# Patient Record
Sex: Male | Born: 1955 | Race: Black or African American | Hispanic: No | Marital: Single | Smoking: Current some day smoker
Health system: Southern US, Community
[De-identification: ages and names within clinical notes are randomized; demographics above are authoritative.]

## PROBLEM LIST (undated history)

## (undated) DIAGNOSIS — J449 Chronic obstructive pulmonary disease, unspecified: Secondary | ICD-10-CM

## (undated) DIAGNOSIS — F17200 Nicotine dependence, unspecified, uncomplicated: Secondary | ICD-10-CM

## (undated) DIAGNOSIS — F1010 Alcohol abuse, uncomplicated: Secondary | ICD-10-CM

## (undated) DIAGNOSIS — I471 Supraventricular tachycardia, unspecified: Secondary | ICD-10-CM

## (undated) DIAGNOSIS — J309 Allergic rhinitis, unspecified: Secondary | ICD-10-CM

## (undated) DIAGNOSIS — M7021 Olecranon bursitis, right elbow: Secondary | ICD-10-CM

## (undated) DIAGNOSIS — I10 Essential (primary) hypertension: Secondary | ICD-10-CM

## (undated) DIAGNOSIS — Z862 Personal history of diseases of the blood and blood-forming organs and certain disorders involving the immune mechanism: Secondary | ICD-10-CM

## (undated) DIAGNOSIS — I2510 Atherosclerotic heart disease of native coronary artery without angina pectoris: Secondary | ICD-10-CM

## (undated) HISTORY — DX: Essential (primary) hypertension: I10

## (undated) HISTORY — DX: Allergic rhinitis, unspecified: J30.9

## (undated) HISTORY — DX: Olecranon bursitis, right elbow: M70.21

## (undated) HISTORY — DX: Alcohol abuse, uncomplicated: F10.10

## (undated) HISTORY — DX: Supraventricular tachycardia: I47.1

## (undated) HISTORY — DX: Atherosclerotic heart disease of native coronary artery without angina pectoris: I25.10

## (undated) HISTORY — DX: Supraventricular tachycardia, unspecified: I47.10

## (undated) HISTORY — DX: Chronic obstructive pulmonary disease, unspecified: J44.9

## (undated) HISTORY — DX: Personal history of diseases of the blood and blood-forming organs and certain disorders involving the immune mechanism: Z86.2

## (undated) HISTORY — PX: COLONOSCOPY: SHX174

## (undated) HISTORY — DX: Nicotine dependence, unspecified, uncomplicated: F17.200

---

## 2002-04-16 ENCOUNTER — Emergency Department (HOSPITAL_COMMUNITY): Admission: EM | Admit: 2002-04-16 | Discharge: 2002-04-17 | Payer: Self-pay | Admitting: Emergency Medicine

## 2002-04-17 ENCOUNTER — Encounter: Payer: Self-pay | Admitting: Emergency Medicine

## 2005-08-19 ENCOUNTER — Emergency Department (HOSPITAL_COMMUNITY): Admission: EM | Admit: 2005-08-19 | Discharge: 2005-08-19 | Payer: Self-pay | Admitting: Emergency Medicine

## 2005-08-24 ENCOUNTER — Emergency Department (HOSPITAL_COMMUNITY): Admission: EM | Admit: 2005-08-24 | Discharge: 2005-08-25 | Payer: Self-pay | Admitting: *Deleted

## 2005-08-31 ENCOUNTER — Emergency Department (HOSPITAL_COMMUNITY): Admission: EM | Admit: 2005-08-31 | Discharge: 2005-08-31 | Payer: Self-pay | Admitting: Emergency Medicine

## 2005-09-03 ENCOUNTER — Inpatient Hospital Stay (HOSPITAL_COMMUNITY): Admission: EM | Admit: 2005-09-03 | Discharge: 2005-09-04 | Payer: Self-pay | Admitting: Emergency Medicine

## 2005-09-05 ENCOUNTER — Encounter (HOSPITAL_COMMUNITY): Admission: RE | Admit: 2005-09-05 | Discharge: 2005-09-15 | Payer: Self-pay | Admitting: Internal Medicine

## 2005-12-07 ENCOUNTER — Emergency Department (HOSPITAL_COMMUNITY): Admission: EM | Admit: 2005-12-07 | Discharge: 2005-12-08 | Payer: Self-pay | Admitting: *Deleted

## 2007-12-18 ENCOUNTER — Encounter: Payer: Self-pay | Admitting: Family Medicine

## 2008-02-23 ENCOUNTER — Ambulatory Visit (HOSPITAL_COMMUNITY): Admission: RE | Admit: 2008-02-23 | Discharge: 2008-02-23 | Payer: Self-pay | Admitting: Family Medicine

## 2008-02-23 ENCOUNTER — Ambulatory Visit: Payer: Self-pay | Admitting: Family Medicine

## 2008-02-23 LAB — CONVERTED CEMR LAB
Cholesterol: 161 mg/dL (ref 0–200)
HDL: 70 mg/dL (ref >39)
LDL Cholesterol: 58 mg/dL (ref 0–99)
PSA: 1.1 ng/mL (ref 0.10–4.00)
Total CHOL/HDL Ratio: 2.3
Triglycerides: 167 mg/dL — ABNORMAL HIGH (ref <150)
VLDL: 33 mg/dL (ref 0–40)

## 2008-03-30 DIAGNOSIS — F17218 Nicotine dependence, cigarettes, with other nicotine-induced disorders: Secondary | ICD-10-CM | POA: Insufficient documentation

## 2008-03-30 DIAGNOSIS — I10 Essential (primary) hypertension: Secondary | ICD-10-CM | POA: Insufficient documentation

## 2008-03-30 DIAGNOSIS — J441 Chronic obstructive pulmonary disease with (acute) exacerbation: Secondary | ICD-10-CM | POA: Insufficient documentation

## 2008-03-31 ENCOUNTER — Encounter: Payer: Self-pay | Admitting: Family Medicine

## 2008-03-31 ENCOUNTER — Ambulatory Visit: Payer: Self-pay | Admitting: Family Medicine

## 2008-07-08 ENCOUNTER — Ambulatory Visit: Payer: Self-pay | Admitting: Family Medicine

## 2008-10-14 ENCOUNTER — Ambulatory Visit: Payer: Self-pay | Admitting: Family Medicine

## 2009-02-10 ENCOUNTER — Telehealth: Payer: Self-pay | Admitting: Family Medicine

## 2009-03-15 ENCOUNTER — Ambulatory Visit: Payer: Self-pay | Admitting: Family Medicine

## 2009-03-15 LAB — CONVERTED CEMR LAB
BUN: 18 mg/dL (ref 6–23)
Basophils Absolute: 0.1 10*3/uL (ref 0.0–0.1)
Basophils Relative: 1 % (ref 0–1)
CO2: 24 meq/L (ref 19–32)
Calcium: 9.6 mg/dL (ref 8.4–10.5)
Chloride: 103 meq/L (ref 96–112)
Cholesterol: 145 mg/dL (ref 0–200)
Creatinine, Ser: 1.04 mg/dL (ref 0.40–1.50)
Eosinophils Absolute: 0.3 10*3/uL (ref 0.0–0.7)
Eosinophils Relative: 3 % (ref 0–5)
Glucose, Bld: 94 mg/dL (ref 70–99)
HCT: 50.9 % (ref 39.0–52.0)
HDL: 55 mg/dL (ref >39)
Hemoglobin: 16.6 g/dL (ref 13.0–17.0)
LDL Cholesterol: 66 mg/dL (ref 0–99)
Lymphocytes Relative: 33 % (ref 12–46)
Lymphs Abs: 3 10*3/uL (ref 0.7–4.0)
MCHC: 32.6 g/dL (ref 30.0–36.0)
MCV: 85.5 fL (ref 78.0–100.0)
Monocytes Absolute: 1 10*3/uL (ref 0.1–1.0)
Monocytes Relative: 11 % (ref 3–12)
Neutro Abs: 4.9 10*3/uL (ref 1.7–7.7)
Neutrophils Relative %: 53 % (ref 43–77)
PSA: 1.11 ng/mL (ref 0.10–4.00)
Platelets: 293 10*3/uL (ref 150–400)
Potassium: 5.1 meq/L (ref 3.5–5.3)
RBC: 5.95 M/uL — ABNORMAL HIGH (ref 4.22–5.81)
RDW: 12.9 % (ref 11.5–15.5)
Sodium: 137 meq/L (ref 135–145)
Total CHOL/HDL Ratio: 2.6
Triglycerides: 118 mg/dL (ref <150)
VLDL: 24 mg/dL (ref 0–40)
WBC: 9.2 10*3/uL (ref 4.0–10.5)

## 2009-07-15 ENCOUNTER — Encounter: Payer: Self-pay | Admitting: Family Medicine

## 2009-08-10 ENCOUNTER — Encounter: Payer: Self-pay | Admitting: Internal Medicine

## 2009-08-10 ENCOUNTER — Ambulatory Visit: Payer: Self-pay | Admitting: Internal Medicine

## 2009-08-10 ENCOUNTER — Ambulatory Visit (HOSPITAL_COMMUNITY): Admission: RE | Admit: 2009-08-10 | Discharge: 2009-08-10 | Payer: Self-pay | Admitting: Internal Medicine

## 2009-08-12 ENCOUNTER — Encounter: Payer: Self-pay | Admitting: Internal Medicine

## 2009-12-19 ENCOUNTER — Encounter: Payer: Self-pay | Admitting: Family Medicine

## 2010-02-20 ENCOUNTER — Emergency Department (HOSPITAL_COMMUNITY): Admission: EM | Admit: 2010-02-20 | Discharge: 2010-02-20 | Payer: Self-pay | Admitting: Emergency Medicine

## 2010-02-23 ENCOUNTER — Ambulatory Visit: Payer: Self-pay | Admitting: Family Medicine

## 2010-02-23 ENCOUNTER — Encounter (INDEPENDENT_AMBULATORY_CARE_PROVIDER_SITE_OTHER): Payer: Self-pay

## 2010-02-23 DIAGNOSIS — R071 Chest pain on breathing: Secondary | ICD-10-CM | POA: Insufficient documentation

## 2010-03-17 ENCOUNTER — Ambulatory Visit: Payer: Self-pay | Admitting: Family Medicine

## 2010-06-23 ENCOUNTER — Ambulatory Visit: Payer: Self-pay | Admitting: Family Medicine

## 2010-11-27 ENCOUNTER — Ambulatory Visit: Payer: Self-pay | Admitting: Family Medicine

## 2010-11-27 ENCOUNTER — Encounter: Payer: Self-pay | Admitting: Family Medicine

## 2010-11-27 DIAGNOSIS — F1010 Alcohol abuse, uncomplicated: Secondary | ICD-10-CM | POA: Insufficient documentation

## 2010-11-27 DIAGNOSIS — E441 Mild protein-calorie malnutrition: Secondary | ICD-10-CM | POA: Insufficient documentation

## 2010-11-27 LAB — CONVERTED CEMR LAB
BUN: 13 mg/dL (ref 6–23)
Basophils Absolute: 0.1 10*3/uL (ref 0.0–0.1)
Basophils Relative: 3 % — ABNORMAL HIGH (ref 0–1)
CO2: 27 meq/L (ref 19–32)
Calcium: 9.8 mg/dL (ref 8.4–10.5)
Chloride: 100 meq/L (ref 96–112)
Cholesterol: 185 mg/dL (ref 0–200)
Creatinine, Ser: 0.97 mg/dL (ref 0.40–1.50)
Eosinophils Absolute: 0.1 10*3/uL (ref 0.0–0.7)
Eosinophils Relative: 3 % (ref 0–5)
Glucose, Bld: 94 mg/dL (ref 70–99)
HCT: 46.4 % (ref 39.0–52.0)
HDL: 98 mg/dL (ref >39)
Hemoglobin: 15.7 g/dL (ref 13.0–17.0)
LDL Cholesterol: 72 mg/dL (ref 0–99)
Lymphocytes Relative: 48 % — ABNORMAL HIGH (ref 12–46)
Lymphs Abs: 2.2 10*3/uL (ref 0.7–4.0)
MCHC: 33.8 g/dL (ref 30.0–36.0)
MCV: 84.2 fL (ref 78.0–100.0)
Monocytes Absolute: 0.6 10*3/uL (ref 0.1–1.0)
Monocytes Relative: 12 % (ref 3–12)
Neutro Abs: 1.6 10*3/uL — ABNORMAL LOW (ref 1.7–7.7)
Neutrophils Relative %: 35 % — ABNORMAL LOW (ref 43–77)
OCCULT 1: NEGATIVE
PSA: 1.05 ng/mL (ref <=4.00)
Platelets: 273 10*3/uL (ref 150–400)
Potassium: 4.8 meq/L (ref 3.5–5.3)
RBC: 5.51 M/uL (ref 4.22–5.81)
RDW: 12.9 % (ref 11.5–15.5)
Sodium: 138 meq/L (ref 135–145)
TSH: 1.752 microintl units/mL (ref 0.350–4.500)
Total CHOL/HDL Ratio: 1.9
Triglycerides: 74 mg/dL (ref <150)
VLDL: 15 mg/dL (ref 0–40)
WBC: 4.6 10*3/uL (ref 4.0–10.5)

## 2011-01-01 ENCOUNTER — Ambulatory Visit
Admission: RE | Admit: 2011-01-01 | Discharge: 2011-01-01 | Payer: Self-pay | Source: Home / Self Care | Attending: Family Medicine | Admitting: Family Medicine

## 2011-01-10 DIAGNOSIS — J309 Allergic rhinitis, unspecified: Secondary | ICD-10-CM | POA: Insufficient documentation

## 2011-01-16 NOTE — Assessment & Plan Note (Signed)
Summary: F UP   Vital Signs:  Patient profile:   55 year old male Weight:      136.04 pounds O2 Sat:      99 % Pulse (ortho):   80 / minute Pulse rhythm:   regular BP sitting:   110 / 80  (left arm)  Primary Care Provider:  [name] MD   History of Present Illness: Reports  that he has been doing well. he denies any adverse side effects from the bp meds, no lightheadedness or cough. He is now smoking 6 ciggs/day, he plans to quit Denies recent fever or chills. Denies sinus pressure, nasal congestion , ear pain or sore throat. Denies chest congestion, or cough productive of sputum. Denies chest pain, palpitations, PND, orthopnea or leg swelling. Denies abdominal pain, nausea, vomitting, diarrhea or constipation. Denies change in bowel movements or bloody stool. Denies dysuria , frequency, incontinence or hesitancy. Denies  joint pain, swelling, or reduced mobility. Denies headaches, vertigo, seizures. Denies depression, anxiety or insomnia. Denies  rash, lesions, or itch.     Preventive Screening-Counseling & Management  Alcohol-Tobacco     Smoking Cessation Counseling: yes  Allergies: No Known Drug Allergies  Review of Systems      See HPI Eyes:  Denies discharge and red eye. ENT:  concerned about poor dental health. Endo:  Denies cold intolerance, excessive hunger, excessive thirst, excessive urination, heat intolerance, polyuria, and weight change. Heme:  Denies abnormal bruising and bleeding. Allergy:  Complains of seasonal allergies; denies hives or rash and itching eyes.  Physical Exam  General:  Well-developed,well-nourished,in no acute distress; alert,appropriate and cooperative throughout examination HEENT: No facial asymmetry,  EOMI, No sinus tenderness, TM's Clear, oropharynx  pink and moist.   Chest: Clear to auscultation bilaterally.  CVS: S1, S2, No murmurs, No S3.   Abd: Soft, Nontender.  MS: Adequate ROM spine, hips, shoulders and knees.   Ext: No edema.   CNS: CN 2-12 intact, power tone and sensation normal throughout.   Skin: Intact, no visible lesions or rashes.  Psych: Good eye contact, normal affect.  Memory intact, not anxious or depressed appearing.    Impression & Recommendations:  Problem # 1:  NICOTINE ADDICTION (ICD-305.1) Assessment Improved  Encouraged smoking cessation and discussed different methods for smoking cessation.  Quit dates set for [employer]. Currently down to 6/day  Problem # 2:  COPD (ICD-496) Assessment: Unchanged  Problem # 3:  HYPERTENSION (ICD-401.9) Assessment: Improved  His updated medication list for this problem includes:    Lisinopril 40 Mg Tabs (Lisinopril) .[name]... Take 1 tablet by mouth once a day  Orders: T-Basic Metabolic Panel [id number])  BP today: 110/80 Prior BP: 136/100 (03/17/2010)  Labs Reviewed: K+: 5.1 (03/15/2009) Creat: : 1.04 (03/15/2009)   Chol: 145 (03/15/2009)   HDL: 55 (03/15/2009)   LDL: 66 (03/15/2009)   TG: 118 (03/15/2009)  Complete Medication List: 1)  Zyrtec Hives Relief 10 Mg Tabs (Cetirizine hcl) .... Take 1 tablet by mouth once a day as needed for allergy symptoms 2)  Lisinopril 40 Mg Tabs (Lisinopril) .... Take 1 tablet by mouth once a day  Other Orders: T-Lipid Profile ([id number]) T-CBC w/Diff ([id number]) T-PSA ([id number]) T-TSH [id number])  Patient Instructions: 1)  CPE in 4.5 months. 2)  Tobacco is very bad for your health and your loved ones! You Should stop smoking!. 3)  Stop Smoking Tips: Choose a Quit date. Cut down before the Quit date. decide what you will do as a substitute when you  feel the urge to smoke(gum,toothpick,exercise). 4)  BMP prior to visit, ICD-9: 5)  Lipid Panel prior to visit, ICD-9:  fasting asap 6)  TSH prior to visit, ICD-9: 7)  CBC w/ Diff prior to visit, ICD-9: 8)  PSA prior to visit, ICD-9: 9)  Your blood pressure is great , no med changes. 10)  I am proud of you commiting to quitting, pls  stick with the program 11)  Pls do attend to your teeth Prescriptions: LISINOPRIL 40 MG TABS (LISINOPRIL) Take 1 tablet by mouth once a day  #30 Each x 3   Entered by:   Adella Hare LPN   Authorized by:   [name] MD   Signed by:   Adella Hare LPN on 45/40/9811   Method used:   Electronically to        [facility] (retail)       [address] [place], [place]  [address]       Ph: [phone]       Fax: [phone]   RxID:   [id number]

## 2011-01-16 NOTE — Letter (Signed)
Summary: history and physical  history and physical   Imported By: Curtis Sites 05/19/2010 11:54:02  _____________________________________________________________________  External Attachment:    Type:   Image     Comment:   External Document

## 2011-01-16 NOTE — Assessment & Plan Note (Signed)
Summary: OV   Vital Signs:  Patient profile:   55 year old male Height:      70 inches Weight:      140.75 pounds BMI:     20.27 O2 Sat:      98 % Pulse rate:   91 / minute Pulse rhythm:   regular Resp:     16 per minute BP sitting:   136 / 100  (left arm) Cuff size:   regular  Vitals Entered By: Everitt Amber LPN (March 17, 980 8:46 AM) CC: Follow up chronic problems, hypertension   Primary Care Provider:  [name] MD  CC:  Follow up chronic problems and hypertension.  History of Present Illness: Reports  that he has been  doing well. Denies recent fever or chills. Denies sinus pressure, nasal congestion , ear pain or sore throat. Denies chest congestion, or cough productive of sputum. Denies chest pain, palpitations, PND, orthopnea or leg swelling. Denies abdominal pain, nausea, vomitting, diarrhea or constipation. Denies change in bowel movements or bloody stool. Denies dysuria , frequency, incontinence or hesitancy. Denies  joint pain, swelling, or reduced mobility. Denies headaches, vertigo, seizures. Denies depression, anxiety or insomnia. Denies  rash, lesions, or itch.   the pt is currently smokiing between 8 to 10 ciggs daily, we have discussed a method of reducing this to a max of 6 per day and will work on this.l   Preventive Screening-Counseling & Management  Alcohol-Tobacco     Smoking Cessation Counseling: yes  Current Medications (verified): 1)  Lisinopril 20 Mg  Tabs (Lisinopril) .... One Tab By Mouth Once Daily 2)  Zyrtec Hives Relief 10 Mg Tabs (Cetirizine Hcl) .... Take 1 Tablet By Mouth Once A Day As Needed For Allergy Symptoms  Allergies (verified): No Known Drug Allergies  Review of Systems      See HPI Eyes:  Denies blurring and discharge. Endo:  Denies cold intolerance, excessive hunger, excessive thirst, excessive urination, heat intolerance, polyuria, and weight change. Heme:  Denies abnormal bruising, bleeding, and  fevers. Allergy:  Complains of itching eyes, seasonal allergies, and sneezing; increased symptoms currently , which is not unexpected.  Physical Exam  General:  Well-developed,well-nourished,in no acute distress; alert,appropriate and cooperative throughout examination HEENT: No facial asymmetry,  EOMI, No sinus tenderness, TM's Clear, oropharynx  pink and moist.   Chest: Clear to auscultation bilaterally.  CVS: S1, S2, No murmurs, No S3.   Abd: Soft, Nontender.  MS: Adequate ROM spine, hips, shoulders and knees.  Ext: No edema.   CNS: CN 2-12 intact, power tone and sensation normal throughout.   Skin: Intact, no visible lesions or rashes.  Psych: Good eye contact, normal affect.  Memory intact, not anxious or depressed appearing.    Impression & Recommendations:  Problem # 1:  NICOTINE ADDICTION (ICD-305.1) Assessment Improved  Encouraged smoking cessation and discussed different methods for smoking cessation.   Problem # 2:  HYPERTENSION (ICD-401.9) Assessment: Improved  The following medications were removed from the medication list:    Lisinopril 20 Mg Tabs (Lisinopril) ..... One tab by mouth once daily His updated medication list for this problem includes:    Lisinopril 40 Mg Tabs (Lisinopril) .[name]... Take 1 tablet by mouth once a day  BP today: 136/100 Prior BP: 160/120 (02/23/2010)  Labs Reviewed: K+: 5.1 (03/15/2009) Creat: : 1.04 (03/15/2009)   Chol: 145 (03/15/2009)   HDL: 55 (03/15/2009)   LDL: 66 (03/15/2009)   TG: 118 (03/15/2009)  Problem # 3:  Preventive Health Care (ICD-V70.0) Assessment: Comment Only regular exercise, smoking cessation, seat belt use and good sleep habits and nutrtion discussed and encouraged  Complete Medication List: 1)  Zyrtec Hives Relief 10 Mg Tabs (Cetirizine hcl) .... Take 1 tablet by mouth once a day as needed for allergy symptoms 2)  Lisinopril 40 Mg Tabs (Lisinopril) .... Take 1 tablet by mouth once a day  Other Orders: T-PSA  ([id number])  Patient Instructions: 1)  Please schedule a follow-up appointment in 3 months. 2)  Tobacco is very bad for your health and your loved ones! You Should stop smoking!. 3)  Stop Smoking Tips: Choose a Quit date. Cut down before the Quit date. decide what you will do as a substitute when you feel the urge to smoke(gum,toothpick,exercise).Try to smoke no more than 6 ciggs every day pls 4)  Your BP is better but still high, dose inc to lisinopriol 40mg  daily, take TWO 20mg  tabs every day till done together. Prescriptions: LISINOPRIL 40 MG TABS (LISINOPRIL) Take 1 tablet by mouth once a day  #30 x 2   Entered and Authorized by:   [name] MD   Signed by:   [name] MD on 03/17/2010   Method used:   Printed then faxed to ...       [facility] (retail)       [address]       [place], [place]  [address]       Ph: [phone]       Fax: [phone]   RxID:   [id number]

## 2011-01-16 NOTE — Letter (Signed)
Summary: Out of Work  [facility]  [address]   [place], [place] [address]   Phone: [phone]  Fax: [phone]    February 23, 2010   Employee:  [employer]thal D [employer]    To Whom It May Concern:   For Medical reasons, please excuse the above named employee from work for the following dates:  Start:   02/20/2010  End:   02/23/2010 With No Restrictions  If you need additional information, please feel free to contact our office.         Sincerely,    Milus Mallick. Lodema Hong, M.D.

## 2011-01-16 NOTE — Miscellaneous (Signed)
Summary: refill  Clinical Lists Changes  Medications: Rx of LISINOPRIL 20 MG  TABS (LISINOPRIL) one tab by mouth once daily;  #30 x 3;  Signed;  Entered by: Everitt Amber;  Authorized by: [name] MD;  Method used: Print then Give to Patient    Prescriptions: LISINOPRIL 20 MG  TABS (LISINOPRIL) one tab by mouth once daily  #30 x 3   Entered by:   Everitt Amber   Authorized by:   [name] MD   Signed by:   Everitt Amber on 12/19/2009   Method used:   Print then Give to Patient   RxID:   [id number]

## 2011-01-16 NOTE — Assessment & Plan Note (Signed)
Summary: F UP FROM HOS   Vital Signs:  Patient profile:   55 year old male Height:      70 inches Weight:      139 pounds BMI:     20.02 O2 Sat:      99 % Pulse rate:   80 / minute Pulse rhythm:   regular Resp:     18 per minute BP sitting:   160 / 120  (right arm)  Vitals Entered By: Everitt Amber LPN (February 23, 2010 9:25 AM) CC: went to the ER Monday for sharp pains under left ribs, was told he was having muscle spasms and to follow up with PCP   CC:  went to the ER Monday for sharp pains under left ribs and was told he was having muscle spasms and to follow up with PCP.  History of Present Illness: pt6 was in the ed on 03/07 with left chest pain, he was dx with left chest wall pain, he staes he had been cutting wood the weekend beforwe. He has not taken any BP med for 2 days , just lying around has been taking flexeril 3 times a day, I explained this is the reason. excessive allergy symptoms  Preventive Screening-Counseling & Management  Alcohol-Tobacco     Smoking Cessation Counseling: yes  Current Medications (verified): 1)  Lisinopril 20 Mg  Tabs (Lisinopril) .... One Tab By Mouth Once Daily  Allergies (verified): No Known Drug Allergies  Review of Systems      See HPI General:  Denies chills, fatigue, and fever. Eyes:  Denies blurring and discharge. ENT:  Denies hoarseness, nasal congestion, sinus pressure, and sore throat. CV:  See HPI; Complains of chest pain or discomfort; denies difficulty breathing while lying down, palpitations, and swelling of feet. Resp:  Denies cough, shortness of breath, and sputum productive. GI:  Denies abdominal pain, constipation, diarrhea, nausea, and vomiting. GU:  Denies dysuria and urinary frequency. MS:  Denies joint pain, low back pain, mid back pain, and stiffness. Derm:  Denies changes in color of skin and lesion(s). Neuro:  Denies headaches, seizures, and tremors. Psych:  Denies anxiety and depression. Endo:  Denies  excessive hunger and polyuria. Heme:  Denies abnormal bruising and bleeding. Allergy:  Complains of seasonal allergies.  Physical Exam  General:  Well-developed,well-nourished,in no acute distress; alert,appropriate and cooperative throughout examination HEENT: No facial asymmetry,  EOMI, No sinus tenderness, TM's Clear, oropharynx  pink and moist.   Chest: Clear to auscultation bilaterally.  CVS: S1, S2, No murmurs, No S3.   Abd: Soft, Nontender.  MS: Adequate ROM spine, hips, shoulders and knees.  Ext: No edema.   CNS: CN 2-12 intact, power tone and sensation normal throughout.   Skin: Intact, no visible lesions or rashes.  Psych: Good eye contact, normal affect.  Memory intact, not anxious or depressed appearing.    Impression & Recommendations:  Problem # 1:  CHEST WALL PAIN, ACUTE (ZOX-096.04) Assessment Improved  Problem # 2:  NICOTINE ADDICTION (ICD-305.1) Assessment: Comment Only  Encouraged smoking cessation and discussed different methods for smoking cessation.   Problem # 3:  HYPERTENSION (ICD-401.9) Assessment: Deteriorated  His updated medication list for this problem includes:    Lisinopril 20 Mg Tabs (Lisinopril) ..... One tab by mouth once daily  BP today: 160/120 Prior BP: 120/80 (03/15/2009)  Labs Reviewed: K+: 5.1 (03/15/2009) Creat: : 1.04 (03/15/2009)   Chol: 145 (03/15/2009)   HDL: 55 (03/15/2009)   LDL: 66 (03/15/2009)  TG: 118 (03/15/2009)  Complete Medication List: 1)  Lisinopril 20 Mg Tabs (Lisinopril) .... One tab by mouth once daily 2)  Zyrtec Hives Relief 10 Mg Tabs (Cetirizine hcl) .... Take 1 tablet by mouth once a day as needed for allergy symptoms  Patient Instructions: 1)  keep appt as before. 2)  Tobacco is very bad for your health and your loved ones! You Should stop smoking!. 3)  Stop Smoking Tips: Choose a Quit date. Cut down before the Quit date. decide what you will do as a substitute when you feel the urge to  smoke(gum,toothpick,exercise). 4)  pls ensure you take your bP meds every day. 5)  use the muscle relaxant at bedtime only if needed... cyclobenzaprine, also reduce the pain meds to one daily for the next 2 days then back to twice daily till done Prescriptions: ZYRTEC HIVES RELIEF 10 MG TABS (CETIRIZINE HCL) Take 1 tablet by mouth once a day as needed for allergy symptoms  #30 x 3   Entered and Authorized by:   [name] MD   Signed by:   [name] MD on 02/23/2010   Method used:   Electronically to        [facility] (retail)       [address] [place], [place]  [address]       Ph: [phone]       Fax: [phone]   RxID:   [id number]

## 2011-01-16 NOTE — Letter (Signed)
Summary: consults  consults   Imported By: Curtis Sites 05/19/2010 11:51:01  _____________________________________________________________________  External Attachment:    Type:   Image     Comment:   External Document

## 2011-01-16 NOTE — Letter (Signed)
Summary: referral log  referral log   Imported By: Curtis Sites 05/19/2010 11:51:45  _____________________________________________________________________  External Attachment:    Type:   Image     Comment:   External Document

## 2011-01-16 NOTE — Letter (Signed)
Summary: xray  xray   Imported By: Curtis Sites 05/19/2010 11:49:32  _____________________________________________________________________  External Attachment:    Type:   Image     Comment:   External Document

## 2011-01-16 NOTE — Letter (Signed)
Summary: misc  misc   Imported By: Curtis Sites 05/19/2010 11:52:51  _____________________________________________________________________  External Attachment:    Type:   Image     Comment:   External Document

## 2011-01-16 NOTE — Letter (Signed)
Summary: WORK/SCHOOL  NOTE  WORK/SCHOOL  NOTE   Imported By: Lind Guest 02/23/2010 13:56:56  _____________________________________________________________________  External Attachment:    Type:   Image     Comment:   External Document

## 2011-01-16 NOTE — Letter (Signed)
Summary: demographic  demographic   Imported By: Curtis Sites 05/19/2010 11:47:00  _____________________________________________________________________  External Attachment:    Type:   Image     Comment:   External Document

## 2011-01-16 NOTE — Letter (Signed)
Summary: FMLA PAPERS  FMLA PAPERS   Imported By: Lind Guest 02/23/2010 13:56:24  _____________________________________________________________________  External Attachment:    Type:   Image     Comment:   External Document

## 2011-01-16 NOTE — Letter (Signed)
Summary: progress notes  progress notes   Imported By: Curtis Sites 05/19/2010 11:47:37  _____________________________________________________________________  External Attachment:    Type:   Image     Comment:   External Document

## 2011-01-16 NOTE — Letter (Signed)
Summary: lab  lab   Imported By: Curtis Sites 05/19/2010 11:48:03  _____________________________________________________________________  External Attachment:    Type:   Image     Comment:   External Document

## 2011-01-18 NOTE — Assessment & Plan Note (Signed)
Summary: F UP   Vital Signs:  Patient profile:   55 year old male Height:      70 inches Weight:      137 pounds BMI:     19.73 O2 Sat:      97 % Pulse rate:   96 / minute Pulse rhythm:   regular Resp:     16 per minute BP sitting:   124 / 84  (left arm) Cuff size:   regular  Vitals Entered By: Everitt Amber LPN (January 01, 2011 8:36 AM) CC: Follow up chronic problems   Primary Care Provider:  [name] MD  CC:  Follow up chronic problems.  History of Present Illness: Reports  that he has been doping well, except for excessive clear nasal drainage , and he is cuttng back on ciggs , avg 2 /d ay , also drinking Denies recent fever or chills. Denies sinus pressure, nasal congestion , ear pain or sore throat. Denies chest congestion, or cough productive of sputum. Denies chest pain, palpitations, PND, orthopnea or leg swelling. Denies abdominal pain, nausea, vomitting, diarrhea or constipation. Denies change in bowel movements or bloody stool. Denies dysuria , frequency, incontinence or hesitancy. Denies  joint pain, swelling, or reduced mobility. Denies headaches, vertigo, seizures. Denies depression, anxiety or insomnia. Denies  rash, lesions, or itch.     Preventive Screening-Counseling & Management  Alcohol-Tobacco     Smoking Cessation Counseling: yes  Current Medications (verified): 1)  Lisinopril 40 Mg Tabs (Lisinopril) .... Take 1 Tablet By Mouth Once A Day  Allergies (verified): No Known Drug Allergies  Review of Systems      See HPI Eyes:  Denies discharge and red eye. Endo:  Denies cold intolerance, excessive hunger, excessive thirst, and heat intolerance. Heme:  Denies abnormal bruising and bleeding. 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  Physical Exam  General:  Well-developed,well-nourished,in no acute distress; alert,appropriate and cooperative  throughout examination HEENT: No facial asymmetry,erythema and edma of nasal mucosa  EOMI, No sinus tenderness, TM's Clear, oropharynx  pink and moist.   Chest: Clear to auscultation bilaterally.  CVS: S1, S2, No murmurs, No S3.   Abd: Soft, Nontender.  MS: Adequate ROM spine, hips, shoulders and knees.  Ext: No edema.   CNS: CN 2-12 intact, power tone and sensation normal throughout.   Skin: Intact, no visible lesions or rashes.  Psych: Good eye contact, normal affect.  Memory intact, not anxious or depressed appearing.    Impression & Recommendations:  Problem # 1:  NICOTINE ADDICTION (ICD-305.1) Assessment Improved  Encouraged smoking cessation and discussed different methods for smoking cessation.   Problem # 2:  ALCOHOL ABUSE (ICD-305.00) Assessment: Improved  Problem # 3:  HYPERTENSION (ICD-401.9) Assessment: Improved  The following medications were removed from the medication list:    Lisinopril 40 Mg Tabs (Lisinopril) .[name]... Take 1 tablet by mouth once a day His updated medication list for this problem includes:    Lisinopril 40 Mg Tabs (Lisinopril) .[name]... Take 1 tablet by mouth once a day  BP today: 124/84 Prior BP: 180/122 (11/27/2010)  Labs Reviewed: K+: 4.8 (11/27/2010) Creat: : 0.97 (11/27/2010)   Chol: 185 (11/27/2010)   HDL: 98 (11/27/2010)   LDL: 72 (11/27/2010)   TG: 74 (11/27/2010)  Problem # 4:  ALLERGIC RHINITIS CAUSE UNSPECIFIED (ICD-477.9) Assessment: Deteriorated  His updated medication list for this problem includes:    Fluticasone  Propionate 50 Mcg/act Susp (Fluticasone propionate) .[name]... 2 puffs per nostril once daily  Complete Medication List: 1)  Fluticasone Propionate 50 Mcg/act Susp (Fluticasone propionate) .... 2 puffs per nostril once daily 2)  Lisinopril 40 Mg Tabs (Lisinopril) .... Take 1 tablet by mouth once a day  Patient Instructions: 1)  Please schedule a follow-up appointment in 4 months. 2)  Tobacco is very bad for your health  and your loved ones! You Should stop smoking!. 3)  Stop Smoking Tips: Choose a Quit date. Cut down before the Quit date. decide what you will do as a substitute when you feel the urge to smoke(gum,toothpick,exercise). 4)  It is not healthy  for men to drink more than 2-3 drinks per day or for women to drink more than 1-2 drinks per day. 5)  your bP is excellent 120/89, no med changes. 6)  New med for seasonal allergies Prescriptions: LISINOPRIL 40 MG TABS (LISINOPRIL) Take 1 tablet by mouth once a day  #30 x 4   Entered and Authorized by:   [name] MD   Signed by:   [name] MD on 01/01/2011   Method used:   Electronically to        [facility] (retail)       [address]       [place], [place]  [address]       Ph: [phone]       Fax: [phone]   RxID:   [id number] FLUTICASONE PROPIONATE 50 MCG/ACT SUSP (FLUTICASONE PROPIONATE) 2 puffs per nostril once daily  #1 x 3   Entered and Authorized by:   [name] MD   Signed by:   [name] MD on 01/01/2011   Method used:   Electronically to        [facility] (retail)       [address]       [place], [place]  [address]       Ph: [phone]       Fax: [phone]   RxID:   [id number]    Orders Added: 1)  Est. Patient Level IV [[address]]

## 2011-01-18 NOTE — Assessment & Plan Note (Signed)
Summary: phy   Vital Signs:  Patient profile:   55 year old male Height:      70 inches Weight:      132.75 pounds BMI:     19.12 O2 Sat:      98 % on Room air Pulse rate:   93 / minute Resp:     16 per minute BP sitting:   180 / 122  (left arm)  Vitals Entered By: Adella Hare LPN (November 27, 2010 8:08 AM)  O2 Flow:  Room air CC: physical Is Patient Diabetic? No Pain Assessment Patient in pain? no      Comments did not bring meds but states he is still taking bp med  Vision Screening:Both eyes w/o correction:  20/ 15 Left eye with correction: 20 / 20 Right eye with correction: 20 / 20        Vision Entered By: Adella Hare LPN (November 27, 2010 9:15 AM)   Primary Care Provider:  [name] MD  CC:  physical.  History of Present Illness: Reports  that he ha s generally been doing well Denies recent fever or chills. Denies sinus pressure, nasal congestion , ear pain or sore throat. Denies chest congestion, or cough productive of sputum. Denies chest pain, palpitations, PND, orthopnea or leg swelling. Denies abdominal pain, nausea, vomitting, diarrhea or constipation. Denies change in bowel movements or bloody stool. Denies dysuria , frequency, incontinence or hesitancy. Denies  joint pain, swelling, or reduced mobility. Denies headaches, vertigo, seizures. Denies depression, anxiety or insomnia. Denies  rash, lesions, or itch.  Pt is drinking as many as 8 beers per day on the weekend reportedly, and has not taken his bP med for the past 3 days.   Current Medications (verified): 1)  Lisinopril 40 Mg Tabs (Lisinopril) .... Take 1 Tablet By Mouth Once A Day  Allergies (verified): No Known Drug Allergies  Review of Systems      See HPI General:  Complains of weight loss. Eyes:  Denies discharge and red eye. Endo:  Denies cold intolerance, excessive hunger, excessive thirst, and excessive urination. Heme:  Denies abnormal bruising and  bleeding. Allergy:  Denies hives or rash and itching eyes.  Physical Exam  General:  Well-developed,underweight,in no acute distress; alert,appropriate and cooperative throughout examination Head:  Normocephalic and atraumatic without obvious abnormalities. No apparent alopecia or balding. Eyes:  No corneal or conjunctival inflammation noted. EOMI. Perrla. Funduscopic exam benign, without hemorrhages or  exudates . Vision grossly normal. Ears:  External ear exam shows no significant lesions or deformities.  Otoscopic examination reveals clear canals, tympanic membranes are intact bilaterally without bulging, retraction, inflammation or discharge. Hearing is grossly normal bilaterally. Nose:  External nasal examination shows no deformity or inflammation. Nasal mucosa are pink and moist without lesions or exudates. Mouth:  pharynx pink and moist, poor dentition, and teeth missing.   Neck:  No deformities, masses, or tenderness noted. Chest Wall:  No deformities, masses, tenderness or gynecomastia noted. Breasts:  No masses or gynecomastia noted Lungs:  Normal respiratory effort, chest expands symmetrically. Lungs are clear to auscultation, no crackles or wheezes. Heart:  Normal rate and regular rhythm. S1 and S2 normal without gallop, murmur, click, rub or other extra sounds. Abdomen:  Bowel sounds positive,abdomen soft and non-tender without masses, organomegaly or hernias noted. Rectal:  No external abnormalities noted. Normal sphincter tone. No rectal masses or tenderness. Genitalia:  Testes bilaterally descended without nodularity, tenderness or masses. No scrotal masses or lesions. No  penis lesions or urethral discharge. Prostate:  Prostate gland firm and smooth, no enlargement, nodularity, tenderness, mass, asymmetry or induration. Msk:  No deformity or scoliosis noted of thoracic or lumbar spine.   Pulses:  R and L carotid,radial,femoral,dorsalis pedis and posterior tibial pulses are full  and equal bilaterally Extremities:  No clubbing, cyanosis, edema, or deformity noted with normal full range of motion of all joints.   Neurologic:  No cranial nerve deficits noted. Station and gait are normal. Plantar reflexes are down-going bilaterally. DTRs are symmetrical throughout. Sensory, motor and coordinative functions appear intact.Bilateral tremor Skin:  Intact without suspicious lesions or rashes Cervical Nodes:  No lymphadenopathy noted Axillary Nodes:  No palpable lymphadenopathy Inguinal Nodes:  No significant adenopathy Psych:  Cognition and judgment appear intact. Alert and cooperative with normal attention span and concentration. No apparent delusions, illusions, hallucinations   Impression & Recommendations:  Problem # 1:  ALCOHOL ABUSE (ICD-305.00) Assessment Deteriorated counselled re need for rehab, pt does not think it necessary at this time however. I explained his bP is markedly elevated and he has bilateral hand tremor   Problem # 2:  NICOTINE ADDICTION (ICD-305.1) Assessment: Improved  Encouraged smoking cessation and discussed different methods for smoking cessation. Repotedly not smoking more than 1 per day, which he bums  Problem # 3:  HYPERTENSION (ICD-401.9) Assessment: Deteriorated  His updated medication list for this problem includes:    Lisinopril 40 Mg Tabs (Lisinopril) .[name]... Take 1 tablet by mouth once a day  BP today: 180/122, non compliant and withdrawing from alcohol, impt of behavioral change stressed Prior BP: 110/80 (06/23/2010)  Labs Reviewed: K+: 5.1 (03/15/2009) Creat: : 1.04 (03/15/2009)   Chol: 145 (03/15/2009)   HDL: 55 (03/15/2009)   LDL: 66 (03/15/2009)   TG: 118 (03/15/2009)  Orders: T-Basic Metabolic Panel [phone])  Problem # 4:  MALNUTRITION, MILD (ICD-263.1) Assessment: Deteriorated pt encouraged to eat regularly and improve diet, with less alcohol this will improve  Problem # 5:  Preventive Health Care  (ICD-V70.0) Assessment: Comment Only refusing all immunization despite education  Complete Medication List: 1)  Lisinopril 40 Mg Tabs (Lisinopril) .... Take 1 tablet by mouth once a day  Other Orders: T-Lipid Profile (09811-91478) T-CBC w/Diff (29562-13086) T-PSA (57846-96295) T-TSH (28413-24401) Hemoccult Guaiac-1 spec.(in office) (02725)  Patient Instructions: 1)  F/U in 5 week. 2)  It is not healthy  for men to drink more than 2-3 drinks per day or for women to drink more than 1-2 drinks per day. 3)  BMP prior to visit, ICD-9: 4)  Lipid Panel prior to visit, ICD-9:, and hepatic 5)  TSH prior to visit, ICD-9:  fasting today 6)  CBC w/ Diff prior to visit, ICD-9: 7)  PSA prior to visit, ICD-9: 8)  iT is vital that you take your BP meds EVERY day as prescrribed, your blood pressure is extremely HIGH, and if it stays that high you are a t risk of a stroke. 9)  Pls increase food intake, you have lost 8 pounds since April   Orders Added: 1)  Est. Patient 40-64 years [99396] 2)  T-Basic Metabolic Panel [phone] 3)  T-Lipid Profile [80061-22930] 4)  T-CBC w/Diff [25956-38756] 5)  T-PSA [43329-51884] 6)  T-TSH [16606-30160] 7)  Hemoccult Guaiac-1 spec.(in office) [82270]     Laboratory Results  Date/Time Received: November 27, 2010 8:37 AM  Date/Time Reported: November 27, 2010 8:38 AM   Stool - Occult Blood Hemmoccult #1: negative Date: 11/27/2010 Comments: 50201 10L 02/13  118 10/12 Adella Hare LPN  November 27, 2010 8:38 AM

## 2011-03-11 LAB — DIFFERENTIAL
Basophils Absolute: 0.1 10*3/uL (ref 0.0–0.1)
Eosinophils Relative: 3 % (ref 0–5)
Lymphocytes Relative: 53 % — ABNORMAL HIGH (ref 12–46)
Lymphs Abs: 2.8 10*3/uL (ref 0.7–4.0)
Monocytes Absolute: 0.5 10*3/uL (ref 0.1–1.0)
Monocytes Relative: 9 % (ref 3–12)

## 2011-03-11 LAB — COMPREHENSIVE METABOLIC PANEL
AST: 23 U/L (ref 0–37)
Albumin: 4 g/dL (ref 3.5–5.2)
Chloride: 105 mEq/L (ref 96–112)
Creatinine, Ser: 0.99 mg/dL (ref 0.4–1.5)
GFR calc Af Amer: >60 mL/min (ref >60)
Total Bilirubin: 0.5 mg/dL (ref 0.3–1.2)

## 2011-03-11 LAB — CBC
MCV: 88.3 fL (ref 78.0–100.0)
Platelets: 252 10*3/uL (ref 150–400)
WBC: 5.3 10*3/uL (ref 4.0–10.5)

## 2011-03-11 LAB — POCT CARDIAC MARKERS: Troponin i, poc: <0.05 ng/mL (ref 0.00–0.09)

## 2011-05-01 NOTE — Op Note (Signed)
NAME:  [name], [name]              ACCOUNT NO.:  [id number]   MEDICAL RECORD NO.:  [id number]          PATIENT TYPE:  AMB   LOCATION:  DAY                           FACILITY:  [facility]   PHYSICIAN:  R. Roetta Sessions, M.D. DATE OF BIRTH:  1955/12/25   DATE OF PROCEDURE:  08/10/2009  DATE OF DISCHARGE:                               OPERATIVE REPORT   PROCEDURE:  Screening colonoscopy and colonoscopy biopsy.   INDICATIONS FOR PROCEDURE:  A 55 year old gentleman with no lower GI  tract symptoms sent over at the courtesy of Dr. Lodema Hong for colorectal  cancer.  He is devoid of any lower GI tract symptoms.  He has never had  his lower GI tract evaluated.  There is no family history colon polyps  or colon cancer.  Colonoscopy is now being done as a screening maneuver.  Risks, benefits, alternatives and limitations have been reviewed,  questions answered.  Please see documentation in the medical record.   PROCEDURE NOTE:  O2 saturation, blood pressure, pulse and respirations  were monitored throughout the entire procedure.   Conscious sedation:  Versed 5 mg IV, Demerol 100 mg IV in divided doses.   Instrument:  Pentax video chip system.   FINDINGS:  Digital rectal exam revealed no abnormalities.  Scout  findings:  The prep was adequate.  Colon:  Colonic mucosa was surveyed  from the rectosigmoid junction to the left transverse, right colon,  appendiceal orifice, ileocecal valve/cecum.  These structures were well  seen and photographed for the record.  From this level the scope was  slowly and cautiously withdrawn.  All previously mentioned mucosal  surfaces were again seen.  The colonic mucosa appeared normal.  Scope  was pulled down to the rectum where a thorough examination of the rectal  mucosa including retroflexed view in the anal verge demonstrated some  internal and hemorrhoids, a couple anal papilla and two diminutive  rectosigmoid polyps which were cold biopsy/removed.  The patient  tolerated the procedure well and was reacted from endoscopy.  Cecal  withdrawal time 7 minutes.   IMPRESSION:  Two diminutive rectosigmoid polyp status post cold biopsy  removal, anal papilla, internal hemorrhoids, otherwise normal rectum and  colon.   RECOMMENDATIONS:  1. Follow-up on path.  2. Further recommendations to follow.      [name], M.D.  Electronically Signed     RMR/MEDQ  D:  08/10/2009  T:  08/10/2009  Job:  [id number]   cc:   Milus Mallick. Lodema Hong, M.D.  Fax: [phone]

## 2011-05-03 ENCOUNTER — Encounter: Payer: Self-pay | Admitting: Family Medicine

## 2011-05-04 NOTE — H&P (Signed)
NAME:  [name], [name]              ACCOUNT NO.:  [id number]   MEDICAL RECORD NO.:  [id number]          PATIENT TYPE:  INP   LOCATION:  A339                          FACILITY:  [facility]   PHYSICIAN:  Margaretmary Dys, M.D.DATE OF BIRTH:  10/01/1956   DATE OF ADMISSION:  09/03/2005  DATE OF DISCHARGE:  LH                                HISTORY & PHYSICAL   ADMISSION DIAGNOSES:  1.  Cellulitis of the left forearm.  2.  Early Abscess formation of the left forearm.   PRIMARY CARE PHYSICIAN:  The patient is unassigned.   CHIEF COMPLAINT:  Swelling and pain of left forearm.   HISTORY OF PRESENT ILLNESS:  [name] is a 55 year old African-American  male with no significant past medical history who presents to the emergency  room with complaints of pain and swelling in his left forearm.   The patient was in the emergency room on August 25, 2005, after having  injured himself with broken glasses .  The patient claims that it was self-  inflicted lacerations in his left forearm.  He said he was drinking alcohol  at the time.  He claims it occurred at home.  He was seen in the emergency  room and he received staples.   His alcohol level was noted to be 314 in the ER, he was subsequently  discharged with the staples and asked to return a week later for removal of  the sutures.   He said he was doing fairly well at home however, he began to have some pain  and swelling over the left forearm over the last 2 days.  The patient is not  sure if he followed directions in terms of keeping the area fairly dry.   He presented for staple removal this morning and when the staples were  removed, it was found that he had severe swelling with redness of his left  forearm with what appeared to be an abscess formation with fluctuancy in the  left forearm.  He also had lymphadenitis in his left axilla.  He had some  leukocytosis.  The patient reports some chills and rigors at home.  He  denies any  nausea or vomiting.   He does not have any significant past medical history of note including  diabetes or hypertension.   He denies any headaches, dizziness, or lightheadedness.  No chest pain, no  shortness of breath.  He has no frequency, urgency or dysuria.  Due to the  severity of the swelling and the probable early abscess formation, the  patient is being admitted now for IV antibiotics and surgical evaluation.   PAST MEDICAL HISTORY:  Recent laceration of his left forearm on August 25, 2005.  Otherwise no other significant past medical history.   MEDICATIONS:  None.   ALLERGIES:  NO KNOWN DRUG ALLERGIES.   FAMILY HISTORY:  Positive for hypertension, diabetes and coronary artery  disease in parents.  He currently lives with his dad and mom.   SOCIAL HISTORY:  He is single, lives with his father and mother.  He works  as a Press photographer  Financial controller.   He smokes about a pack a day and also drinks alcohol.  He believes that he  may have some dependence issues.   PHYSICAL EXAMINATION:  Conscious, alert, comfortable, not in acute distress,  well oriented in time, place and person.  Blood pressure was 155/104,  temperature 101.7, pulse 90, respiratory rate 20, pain was 2/10.  HEENT:  Normocephalic, atraumatic, oral mucosa was moist with no exudates.  NECK:  Supple, no JVD, no lymphadenopathy.  LUNGS:  Clear clinically with good air entry bilaterally.  HEART:  S1, S2 regular, no S3, S4, gallops or rubs.  ABDOMEN:  Soft, nontender, bowel sounds were positive, no masses palpable.  EXTREMITIES:  Left forearm shows two areas of swelling over his previous  laceration, there is a laceration on the medial aspect measuring about 4 cm  with surrounding area of redness and induration.  He also has a 4-5 cm  laceration in the lateral aspect of the left forearm also swollen, tender,  with an area of fluctuancy.  It appears that they may be draining some pus.  He also has tender left axillary  lymphadenitis.  Right forearm was normal.  CNS:  Exam was grossly intact with no focal deficits.   LABORATORY STUDIES:  White blood cell count 14,300, hemoglobin 14.5,  hematocrit 43, platelet count 270.  Sodium 134, potassium 4.1, chloride 104,  CO2 26, glucose 100, BUN 7, creatinine 1, urine toxicology was negative .  Wound culture is pending.   ASSESSMENT/PLAN:  [name] is a 16 year old African-American male who has  been admitted to the hospital now for cellulitis of his left forearm, and  possible early abscess formation.  The patient apparently sustained multiple  lacerations on August 19, 2005 and on August 25, 2005.  Those were self-  inflicted injuries according to the patient sustained from broken glasses ,  he was drunk at the time.   He came in for stale  removal, but overnight the patient said he was  noticing some chills and rigors, pain in his left forearm and some severe  pain in his left armpit.  Examination revealed cellulitis with early abscess  formation, his staples were removed.  The patient will need parenteral  antibiotics as he has leukocytosis and a fever, and I am also concerned that  he may need surgical exploration.  I requested Dr. Elpidio Anis to see this  patient.   He will be on cefazolin one gram intravenously q.6h. for now, I might  include ciprofloxacin 750 mg p.o. b.i.d. if he does not show any significant  response .  We will keep his left arm elevated.   The patient has received tetanus toxoid on August 19, 2005.   He also has what appears to be mild hypertension and is not on treatment for  this.  I will keep an eye on his blood pressure while here in the hospital  and initiate therapy as needed.  I have discussed this in detail with him.   Pain Control with morphine and Tylenol p.r.n.   Code status is full code.  Anticipate the patient will be hospitalized for  about 2-3 days.     Margaretmary Dys, M.D.  Electronically  Signed     AM/MEDQ  D:  09/03/2005  T:  09/03/2005  Job:  [id number]

## 2011-05-09 ENCOUNTER — Ambulatory Visit (INDEPENDENT_AMBULATORY_CARE_PROVIDER_SITE_OTHER): Payer: PRIVATE HEALTH INSURANCE | Admitting: Family Medicine

## 2011-05-09 ENCOUNTER — Encounter: Payer: Self-pay | Admitting: Family Medicine

## 2011-05-09 VITALS — BP 114/80 | HR 86 | Resp 16 | Ht 70.5 in | Wt 134.4 lb

## 2011-05-09 DIAGNOSIS — F1010 Alcohol abuse, uncomplicated: Secondary | ICD-10-CM

## 2011-05-09 DIAGNOSIS — Z23 Encounter for immunization: Secondary | ICD-10-CM

## 2011-05-09 DIAGNOSIS — I10 Essential (primary) hypertension: Secondary | ICD-10-CM

## 2011-05-09 DIAGNOSIS — R5383 Other fatigue: Secondary | ICD-10-CM

## 2011-05-09 DIAGNOSIS — F17200 Nicotine dependence, unspecified, uncomplicated: Secondary | ICD-10-CM

## 2011-05-09 DIAGNOSIS — Z125 Encounter for screening for malignant neoplasm of prostate: Secondary | ICD-10-CM

## 2011-05-09 DIAGNOSIS — R5381 Other malaise: Secondary | ICD-10-CM

## 2011-05-09 MED ORDER — TRIAMTERENE-HCTZ 37.5-25 MG PO TABS: 1.0000 | ORAL_TABLET | Freq: Every day | ORAL | Status: DC

## 2011-05-09 NOTE — Progress Notes (Signed)
  Subjective:    Patient ID: [name], male    DOB: April 05, 1956, 55 y.o.   MRN: [id number]  HPI 2 weeks ago pt had left facial swelling, has severe dentall disease andis scheduled for a total extraction in the near future, currently on ampicillin.  Smokes 7 ciggs/ day, willing to cut back.  Beer avgs 12 per night on  Weekends He is here for f/u chronic health conditions     Review of Systems   Denies recent fever or chills. Denies sinus pressure, nasal congestion, ear pain or sore throat. Denies chest congestion, productive cough or wheezing. Denies chest pains, palpitations, paroxysmal nocturnal dyspnea, orthopnea and leg swelling Denies abdominal pain, nausea, vomiting,diarrhea or constipation.  Denies rectal bleeding or change in bowel movement. Denies dysuria, frequency, hesitancy or incontinence. Denies joint pain, swelling and limitation in mobility. Denies headaches, seizure, numbness, or tingling. Denies depression, anxiety or insomnia. Denies skin break down or rash.     Objective:   Physical Exam Patient alert and oriented and in no Cardiopulmonary distress.  HEENT: No facial asymmetry, EOMI, no sinus tenderness, TM's clear, Oropharynx pink and moist.  Neck supple no adenopathy.  Chest: Clear to auscultation bilaterally.decreased air entry bilaterally  CVS: S1, S2 no murmurs, no S3.  ABD: Soft non tender. Bowel sounds normal.  Ext: No edema  MS: Adequate ROM spine, shoulders, hips and knees.  Skin: Intact, no ulcerations or rash noted.  Psych: Good eye contact, normal affect. Memory intact not anxious or depressed appearing.  CNS: CN 2-12 intact, power, tone and sensation normal throughout.        Assessment & Plan:

## 2011-05-09 NOTE — Patient Instructions (Signed)
CPE dec 13 or after.  Fasting labs  Dec 12 or after to include PSA, lipid, chem 7 and CBC  Pls cut back by one one cigg each month, you will have quit by December!  Pls cut back on number of beers you drink on the weekends  TDaP today.  All the best with dentql  Work. Call if you need me before

## 2011-05-14 NOTE — Assessment & Plan Note (Signed)
Controlled, no change in medication  

## 2011-05-14 NOTE — Assessment & Plan Note (Signed)
Improved, counseled to taper with a view to quitting

## 2011-05-14 NOTE — Assessment & Plan Note (Signed)
Improved, counseled to continue to taper with a view to quitting

## 2011-05-18 HISTORY — PX: MULTIPLE TOOTH EXTRACTIONS: SHX2053

## 2011-06-27 ENCOUNTER — Telehealth: Payer: Self-pay | Admitting: Family Medicine

## 2011-06-27 NOTE — Telephone Encounter (Signed)
Advised no inhaler has been prescribed from here for patient, wife states he is scheduled to see another doctor tomorrow

## 2011-07-19 ENCOUNTER — Other Ambulatory Visit: Payer: Self-pay | Admitting: Family Medicine

## 2011-07-24 ENCOUNTER — Other Ambulatory Visit: Payer: Self-pay

## 2011-07-24 ENCOUNTER — Emergency Department (HOSPITAL_COMMUNITY)
Admission: EM | Admit: 2011-07-24 | Discharge: 2011-07-24 | Disposition: A | Discharge status: Home / Self Care | Payer: 59 | Attending: Emergency Medicine | Admitting: Emergency Medicine

## 2011-07-24 ENCOUNTER — Encounter (HOSPITAL_COMMUNITY): Payer: Self-pay | Admitting: Emergency Medicine

## 2011-07-24 ENCOUNTER — Emergency Department (HOSPITAL_COMMUNITY): Payer: 59

## 2011-07-24 DIAGNOSIS — I498 Other specified cardiac arrhythmias: Secondary | ICD-10-CM | POA: Insufficient documentation

## 2011-07-24 DIAGNOSIS — J449 Chronic obstructive pulmonary disease, unspecified: Secondary | ICD-10-CM

## 2011-07-24 DIAGNOSIS — F17200 Nicotine dependence, unspecified, uncomplicated: Secondary | ICD-10-CM | POA: Insufficient documentation

## 2011-07-24 DIAGNOSIS — J4489 Other specified chronic obstructive pulmonary disease: Secondary | ICD-10-CM | POA: Insufficient documentation

## 2011-07-24 DIAGNOSIS — I10 Essential (primary) hypertension: Secondary | ICD-10-CM | POA: Insufficient documentation

## 2011-07-24 DIAGNOSIS — F1010 Alcohol abuse, uncomplicated: Secondary | ICD-10-CM | POA: Insufficient documentation

## 2011-07-24 MED ORDER — PREDNISONE 50 MG PO TABS: 50.0000 mg | ORAL_TABLET | Freq: Every day | ORAL | Status: AC

## 2011-07-24 MED ORDER — ALBUTEROL SULFATE (5 MG/ML) 0.5% IN NEBU
5.0000 mg | INHALATION_SOLUTION | RESPIRATORY_TRACT | Status: AC
  Administered 2011-07-24: 5 mg via RESPIRATORY_TRACT
  Filled 2011-07-24: qty 1

## 2011-07-24 MED ORDER — ALBUTEROL SULFATE (5 MG/ML) 0.5% IN NEBU
2.5000 mg | INHALATION_SOLUTION | Freq: Once | RESPIRATORY_TRACT | Status: AC
  Administered 2011-07-24: 2.5 mg via RESPIRATORY_TRACT
  Filled 2011-07-24: qty 0.5

## 2011-07-24 MED ORDER — DOXYCYCLINE HYCLATE 100 MG PO CAPS: 100.0000 mg | ORAL_CAPSULE | Freq: Two times a day (BID) | ORAL | Status: AC

## 2011-07-24 MED ORDER — PREDNISONE 20 MG PO TABS
60.0000 mg | ORAL_TABLET | Freq: Once | ORAL | Status: AC
  Administered 2011-07-24: 60 mg via ORAL
  Filled 2011-07-24: qty 3

## 2011-07-24 MED ORDER — IPRATROPIUM BROMIDE 0.02 % IN SOLN
0.5000 mg | Freq: Once | RESPIRATORY_TRACT | Status: AC
  Administered 2011-07-24: 0.5 mg via RESPIRATORY_TRACT
  Filled 2011-07-24: qty 2.5

## 2011-07-24 MED ORDER — ALBUTEROL SULFATE HFA 108 (90 BASE) MCG/ACT IN AERS: 2.0000 | INHALATION_SPRAY | RESPIRATORY_TRACT | Status: DC | PRN

## 2011-07-24 NOTE — ED Provider Notes (Addendum)
Scribed for Dr. Manus Gunning, the patient was seen in room Hallway 2. This chart was scribed by Hillery Hunter. This patient's care was started at 15:20.    History     CSN: [id number] Arrival date & time: 07/24/2011  3:02 PM  Chief Complaint  Patient presents with  . Shortness of Breath   Patient is a 55 y.o. male presenting with shortness of breath. The history is provided by the patient.  Shortness of Breath  The current episode started more than 2 weeks ago. Progression since onset: waxing and waning. The symptoms are relieved by beta-agonist inhalers. The symptoms are aggravated by allergens. Associated symptoms include shortness of breath. Pertinent negatives include no chest pain, no fever and no cough. He has inhaled smoke recently. He has had no prior hospitalizations. He has had no prior ICU admissions. He has had no prior intubations. His past medical history is significant for asthma (at age 74).   Patient complains of shortness of breath for two weeks. 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 He was seen for this at urgent care by Dr. Jeannie Fend and prescribed an Albuterol inhaler for possible asthma. The patient has used the entire container. He states he has no medical conditions other than a history of HTN and childhood asthma at age 66. He denies chest pain, fever, cough, recent travel, swelling in his legs, drug use, but does smoke 1/2 PPD.  Past Medical History  Diagnosis Date  . COPD (chronic obstructive pulmonary disease)   . Hypertension   . Nicotine addiction   . Alcohol abuse     History reviewed. No pertinent past surgical history.  Family History  Problem Relation Age of Onset  . Diabetes Mother   . Hypertension Mother   . Diabetes Father   . Hypertension Sister     History  Substance Use Topics  . Smoking status: Current Everyday Smoker -- 0.5 packs/day    Types: Cigarettes  . Smokeless tobacco:  Never Used  . Alcohol Use: 10.8 oz/week    18 Cans of beer per week      Review of Systems  Constitutional: Negative for fever.  HENT: Positive for congestion.   Respiratory: Positive for shortness of breath. Negative for cough.   Cardiovascular: Negative for chest pain and leg swelling.  Musculoskeletal: Negative for arthralgias.  All other systems reviewed and are negative.    Physical Exam  BP 165/132  Pulse 87  Temp(Src) 99 F (37.2 C) (Oral)  Resp 26  Ht 6\' 1"  (1.854 m)  Wt 145 lb (65.772 kg)  BMI 19.13 kg/m2  SpO2 97%  Physical Exam  Nursing note and vitals reviewed. Constitutional: He is oriented to person, place, and time. He appears well-developed and well-nourished. No distress.  HENT:  Head: Normocephalic.  Mouth/Throat: Oropharynx is clear and moist. No oropharyngeal exudate.  Eyes: Conjunctivae are normal.  Neck: Neck supple.       Non-tender  Cardiovascular: Normal rate and regular rhythm.   No murmur heard. Pulmonary/Chest: Effort normal. He has wheezes (diffuse inspiratory and expiratory).       Moderate air exchange  Musculoskeletal: He exhibits no edema and no tenderness.  Neurological: He is alert and oriented to person, place, and time.  Skin: Skin is warm and dry.  Psychiatric: He has a normal mood and affect. His behavior is normal.    ED Course  Procedures  OTHER DATA REVIEWED: Nursing notes, vital signs reviewed  DIAGNOSTIC STUDIES: Oxygen Saturation is 97% on room air, normal by my interpretation.     Date: 07/24/2011  Rate: 77  Rhythm: sinus arrhythmia  QRS Axis: normal  Intervals: normal  ST/T Wave abnormalities: early repolarization  Conduction Disutrbances:LVH in the anterior leads  Narrative Interpretation:   Old EKG Reviewed: unchanged and compared with March 2011    LABS / RADIOLOGY:   CHEST - 2 VIEW  Comparison: 02/20/2010  Findings: Minimal spurring in the lower thoracic spine. Lungs clear. Heart size and  pulmonary vascularity normal. No effusion.  IMPRESSION: No acute disease  Original Report Authenticated By: Osa Craver, M.D.   ED COURSE / COORDINATION OF CARE: 15:30. Ordered Albuterol/Atrovent nebulizer and Prednisone 60mg  PO. Ordered 2-view CXR, EKG. 16:59. Recheck patient who is feeling improved, will repeat breathing treatment and reassess after CXR is back. 18:36. Recheck patient who is feeling more improved. Discussed test results and plan of care, given return conditions.   MDM: Differential Diagnosis: PNA, asthma, COPD Nebs, steroids, reassess.   IMPRESSION: Diagnoses that have been ruled out:  Diagnoses that are still under consideration:  Final diagnoses:      PLAN:  Home Diagnostic tests were reviewed and questions answered. Diagnosis, care plan and treatment options were discussed. The patient understand instructions and will follow up as directed. Condition improved The patient is to return the emergency department if there is any worsening of symptoms. I have reviewed the discharge instructions with the patient.   CONDITION ON DISCHARGE: Good   MEDICATIONS GIVEN IN THE E.D.  Medications  albuterol (VENTOLIN HFA) 108 (90 BASE) MCG/ACT inhaler (not administered)  albuterol (PROVENTIL) (5 MG/ML) 0.5% nebulizer solution 5 mg (5 mg Nebulization Given 07/24/11 1555)  ipratropium (ATROVENT) 0.02 % nebulizer solution 0.5 mg (0.5 mg Nebulization Given 07/24/11 1555)  predniSONE (DELTASONE) tablet 60 mg (60 mg Oral Given 07/24/11 1535)  albuterol (PROVENTIL) (5 MG/ML) 0.5% nebulizer solution 2.5 mg (2.5 mg Nebulization Given 07/24/11 1753)  ipratropium (ATROVENT) 0.02 % nebulizer solution 0.5 mg (0.5 mg Nebulization Given 07/24/11 1753)     DISCHARGE MEDICATIONS: New Prescriptions   No medications on file   Scribe Attestation I personally performed the services described in this documentation, which was scribed in my presence.  The recorded information  has been reviewed and considered.         Glynn Octave, MD 07/24/11 1610  Glynn Octave, MD 08/17/11 [phone]

## 2011-07-24 NOTE — ED Notes (Addendum)
Patient c/o shortness of breath x1 wek. Per family member patient has asthma attacks and uses inhaler in which he ran out. Per family member patient had asthma attack last night and was saved by using someone else's inhaler. Patient has appointment with MD on Friday. Respirations slightly labored, audible wheezing heard.

## 2011-07-27 ENCOUNTER — Encounter: Payer: Self-pay | Admitting: Family Medicine

## 2011-07-27 ENCOUNTER — Ambulatory Visit (INDEPENDENT_AMBULATORY_CARE_PROVIDER_SITE_OTHER): Payer: PRIVATE HEALTH INSURANCE | Admitting: Family Medicine

## 2011-07-27 VITALS — BP 130/90 | HR 97 | Ht 70.0 in | Wt 134.0 lb

## 2011-07-27 DIAGNOSIS — J449 Chronic obstructive pulmonary disease, unspecified: Secondary | ICD-10-CM

## 2011-07-27 DIAGNOSIS — F17200 Nicotine dependence, unspecified, uncomplicated: Secondary | ICD-10-CM

## 2011-07-27 MED ORDER — ALBUTEROL SULFATE HFA 108 (90 BASE) MCG/ACT IN AERS: 2.0000 | INHALATION_SPRAY | RESPIRATORY_TRACT | Status: DC | PRN

## 2011-07-27 NOTE — Assessment & Plan Note (Signed)
Patient counseled on his tobacco use. I prescribed a tapering situation he could do it he is not ready to use any other medications.

## 2011-07-27 NOTE — Assessment & Plan Note (Signed)
He has a history of COPD per the problem was however patient is not aware of this. He was not on any medications for asthma or COPD. His risk factors include his greater than 30 year smoking history. At this time we'll continue the plan as prescribed by the emergency department. He will do a short burst of steroids along with the antibiotics. His albuterol inhaler was refilled. I will set him up for PFTs in 6 weeks. 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 He was given a letter that states he can have his albuterol inhaler with him at all times.

## 2011-07-27 NOTE — Patient Instructions (Signed)
Continue the antibiotics and prednisone Use the inhaler every 4 hours at least for the next 48 hours then as needed Keep inhaler with you at all times In 6 weeks we will set you up for Pulmonary function test- to test for COPD/Asthma If you have further trouble with your breathing go to the ER or call for a work-in visit.

## 2011-07-27 NOTE — Progress Notes (Signed)
Addended by: Milinda Antis F on: 07/27/2011 02:51 PM   Modules accepted: Orders

## 2011-07-27 NOTE — Progress Notes (Signed)
  Subjective:    Patient ID: [name], male    DOB: 01/25/1956, 55 y.o.   MRN: [id number]  HPI Asthma/COPD - patient Seen at the ED on 8/7- for asthma he was prescribed prednisone 50 mg daily for 5 days, doxycycline, and albuterol inhaler. 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 His typical job does not involve this. For that past few days he has had coughing and wheezing while at work. He was able to use a coworker's inhaler and stepped outside and his symptoms did improve but they persisted every time he went back to work. He currently smokes one pack per day.Smoke 1ppd  History of Asthma as a child  Note he was seen at [facility]- Dr. Wende Crease- because he was having SOB and wheezing at work approximately one month ago. His symptoms of shortness of breath and wheezing all occurred while he was at work in a cloud of powder from making the food. Prior to the new duties which will in approximately one month he did not have any difficulties with breathing or wheezing.   Review of Systems  GEN- denies fatigue, fever, recent illness  HEENT- denies Rhinorhea, sore throat, eye drainage  CV S- denies chest pain, palpiations, +chest tightness with SOB  RESP- + cough occ sputum production since this started, +wheeze, +SOB EXT- no leg swelling    Objective:   Physical Exam   GEN- NAD, alert and oriented x3 HEENT- non injected sclera, pink conjunctiva, MMM, oropharynx clear Neck- Supple, no LAD CVS- RRR, no murmur RESP-scatttered inspiratory and expiratory wheeze, prolonged expiratory flow, no rhonchi, normal WOB, normal oxygen sat EXT- No edema Pulses- Radial, DP- 2+    CXR- 8/7- no active disease     Assessment & Plan:

## 2011-08-28 ENCOUNTER — Other Ambulatory Visit: Payer: Self-pay | Admitting: Family Medicine

## 2011-09-10 LAB — CBC
HCT: 46.6
MCV: 86.4
RBC: 5.4
WBC: 8.1

## 2011-09-10 LAB — DIFFERENTIAL
Eosinophils Absolute: 0.2
Eosinophils Relative: 3
Lymphocytes Relative: 27
Lymphs Abs: 2.2
Monocytes Relative: 13 — ABNORMAL HIGH

## 2011-09-10 LAB — BASIC METABOLIC PANEL
Chloride: 107
GFR calc Af Amer: >60
Potassium: 4.4
Sodium: 138

## 2011-09-26 ENCOUNTER — Encounter (HOSPITAL_COMMUNITY): Payer: Self-pay | Admitting: *Deleted

## 2011-09-26 ENCOUNTER — Emergency Department (HOSPITAL_COMMUNITY)
Admission: EM | Admit: 2011-09-26 | Discharge: 2011-09-26 | Disposition: A | Discharge status: Home / Self Care | Payer: PRIVATE HEALTH INSURANCE | Attending: Emergency Medicine | Admitting: Emergency Medicine

## 2011-09-26 DIAGNOSIS — J441 Chronic obstructive pulmonary disease with (acute) exacerbation: Secondary | ICD-10-CM | POA: Insufficient documentation

## 2011-09-26 DIAGNOSIS — J45901 Unspecified asthma with (acute) exacerbation: Secondary | ICD-10-CM

## 2011-09-26 DIAGNOSIS — F1010 Alcohol abuse, uncomplicated: Secondary | ICD-10-CM | POA: Insufficient documentation

## 2011-09-26 DIAGNOSIS — I10 Essential (primary) hypertension: Secondary | ICD-10-CM | POA: Insufficient documentation

## 2011-09-26 DIAGNOSIS — Z87891 Personal history of nicotine dependence: Secondary | ICD-10-CM | POA: Insufficient documentation

## 2011-09-26 MED ORDER — IPRATROPIUM BROMIDE 0.02 % IN SOLN
0.5000 mg | Freq: Once | RESPIRATORY_TRACT | Status: DC
Start: 2011-09-26 — End: 2011-09-26
  Administered 2011-09-26: 0.5 mg via RESPIRATORY_TRACT
  Filled 2011-09-26: qty 2.5

## 2011-09-26 MED ORDER — PREDNISONE 20 MG PO TABS
60.0000 mg | ORAL_TABLET | Freq: Once | ORAL | Status: AC
  Administered 2011-09-26: 60 mg via ORAL
  Filled 2011-09-26: qty 3

## 2011-09-26 MED ORDER — ALBUTEROL SULFATE (5 MG/ML) 0.5% IN NEBU
5.0000 mg | INHALATION_SOLUTION | Freq: Once | RESPIRATORY_TRACT | Status: DC
  Administered 2011-09-26: 5 mg via RESPIRATORY_TRACT
  Filled 2011-09-26: qty 1

## 2011-09-26 MED ORDER — ALBUTEROL (5 MG/ML) CONTINUOUS INHALATION SOLN
15.0000 mg/h | INHALATION_SOLUTION | Freq: Once | RESPIRATORY_TRACT | Status: AC
  Administered 2011-09-26: 15 mg/h via RESPIRATORY_TRACT
  Filled 2011-09-26: qty 20

## 2011-09-26 MED ORDER — PREDNISONE 10 MG PO TABS: ORAL_TABLET | ORAL | Status: AC

## 2011-09-26 MED ORDER — IPRATROPIUM BROMIDE 0.02 % IN SOLN
0.5000 mg | Freq: Once | RESPIRATORY_TRACT | Status: AC
  Administered 2011-09-26: 0.5 mg via RESPIRATORY_TRACT
  Filled 2011-09-26: qty 2.5

## 2011-09-26 NOTE — ED Provider Notes (Signed)
Scribed for Ward Givens, MD, the patient was seen in room APA04/APA04. This chart was scribed by [employer]. The patient's care started at 19:07  CSN: [id number] Arrival date & time: 09/26/2011  7:00 PM  Chief Complaint  Patient presents with  . Asthma   HPI [name] is a 55 y.o. male with a history of asthma as a child and recently starting to have episodes of SOB and wheezing. Presents to the Emergency Department complaining of asthma with associated productive cough and shortness of breath. Current episode started at 5pm today with associated productive cough. Patient reports coughing up white phlegm but denies any associated fever. Reports temporal alleviation with inhalers.   Past Medical History  Diagnosis Date  . COPD (chronic obstructive pulmonary disease)   . Hypertension   . Nicotine addiction   . Alcohol abuse   . Asthma     History reviewed. No pertinent past surgical history.  Family History  Problem Relation Age of Onset  . Diabetes Mother   . Hypertension Mother   . Diabetes Father   . Hypertension Sister     History  Substance Use Topics  . Smoking status: Former Smoker -- 0.5 packs/day    Types: Cigarettes    Quit date: 09/24/2011  . Smokeless tobacco: Never Used  . Alcohol Use: 10.8 oz/week    18 Cans of beer per week     "drinks beer every once in awhile"   Employed   Review of Systems  Respiratory: Positive for shortness of breath.   All other systems reviewed and are negative.    Allergies  Review of patient's allergies indicates no known allergies.  Home Medications   Current Outpatient Rx  Name Route Sig Dispense Refill  . ALBUTEROL SULFATE HFA 108 (90 BASE) MCG/ACT IN AERS Inhalation Inhale 2 puffs into the lungs every 4 (four) hours as needed for wheezing. 18 g 6  . ASPIRIN 81 MG PO TBEC  Two tabs by mouth daily     . LISINOPRIL 40 MG PO TABS  TAKE ONE TABLET DAILY FORBLOOD PRESSURE. 30 tablet 3    BP 117/84  Pulse  109  Temp(Src) 99.1 F (37.3 C) (Oral)  Resp 24  Ht 6\' 5"  (1.956 m)  Wt 145 lb (65.772 kg)  BMI 17.19 kg/m2  SpO2 95%  Physical Exam  Nursing note and vitals reviewed. Constitutional: He is oriented to person, place, and time. He appears well-developed and well-nourished. No distress.  HENT:  Head: Normocephalic and atraumatic.  Mouth/Throat: Oropharynx is clear and moist. No oropharyngeal exudate.  Eyes: Conjunctivae are normal. Right eye exhibits no discharge. Left eye exhibits no discharge.  Neck: Neck supple. No tracheal deviation present.  Cardiovascular: Normal rate and normal heart sounds.   Pulmonary/Chest: No accessory muscle usage. Tachypnea (mild) noted. No respiratory distress. He has wheezes (diffuse). He has rhonchi (diffuse). He has no rales.  Abdominal: Soft. Bowel sounds are normal. There is no tenderness. There is no rebound and no guarding.  Musculoskeletal: Normal range of motion. He exhibits no edema and no tenderness.  Lymphadenopathy:    He has no cervical adenopathy.  Neurological: He is alert and oriented to person, place, and time. No cranial nerve deficit.  Skin: Skin is warm and dry. No rash noted. He is not diaphoretic. No erythema.  Psychiatric: He has a normal mood and affect. His behavior is normal.  Pt was examined after the first nebulizer tx ordered by nursing staff  ED  Course  Procedures  DIAGNOSTIC STUDIES: Oxygen Saturation is 95% on room air, normal by my interpretation.    COORDINATION OF CARE:  19:28 - EDP examined patient at bedside and ordered continuous nebulizer and oral prednisone.   20:37 - EDP recheck on patient. Patient feels improved. Patient with rare scattered rhonchi upon ascultation  States he isn't coughing anything up since his treatment.    At this point I do not think he needs antibiotics, will send home with steroids and he has a new inhaler.      I personally performed the services described in this documentation,  which was scribed in my presence. The recorded information has been reviewed and considered. Devoria Albe, MD, Armando Gang   Ward Givens, MD 09/27/11 [phone]

## 2011-09-26 NOTE — ED Notes (Signed)
Respiratory paged for a breathing treatment at this time.  

## 2011-09-26 NOTE — ED Notes (Addendum)
Pt states breathing much better at this time. Pt still getting breathing tx. o2 sats 100%. Pt still has some wheezing noted on the right side, less on the left.

## 2011-09-26 NOTE — ED Notes (Signed)
Pt states he feels likes he is back to normal & says not wheezing.

## 2011-09-26 NOTE — ED Notes (Signed)
Pt states 2 asthma attacks today while at work. Pt states he has been wheezing for the last several days. Exp. Wheezing in all fields.

## 2011-09-26 NOTE — ED Notes (Signed)
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

## 2011-10-10 ENCOUNTER — Ambulatory Visit (HOSPITAL_COMMUNITY): Payer: PRIVATE HEALTH INSURANCE | Attending: Family Medicine

## 2011-10-12 ENCOUNTER — Telehealth: Payer: Self-pay | Admitting: Family Medicine

## 2011-10-12 MED ORDER — LISINOPRIL 40 MG PO TABS: ORAL_TABLET | ORAL | Status: DC

## 2011-10-12 NOTE — Telephone Encounter (Signed)
Sent in

## 2011-10-29 ENCOUNTER — Emergency Department (HOSPITAL_COMMUNITY)
Admission: EM | Admit: 2011-10-29 | Discharge: 2011-10-29 | Disposition: A | Discharge status: Home / Self Care | Payer: 59 | Attending: Emergency Medicine | Admitting: Emergency Medicine

## 2011-10-29 ENCOUNTER — Emergency Department (HOSPITAL_COMMUNITY): Payer: 59

## 2011-10-29 ENCOUNTER — Encounter (HOSPITAL_COMMUNITY): Payer: Self-pay | Admitting: Emergency Medicine

## 2011-10-29 DIAGNOSIS — Z87891 Personal history of nicotine dependence: Secondary | ICD-10-CM | POA: Insufficient documentation

## 2011-10-29 DIAGNOSIS — I10 Essential (primary) hypertension: Secondary | ICD-10-CM | POA: Insufficient documentation

## 2011-10-29 DIAGNOSIS — J45909 Unspecified asthma, uncomplicated: Secondary | ICD-10-CM | POA: Insufficient documentation

## 2011-10-29 DIAGNOSIS — D709 Neutropenia, unspecified: Secondary | ICD-10-CM | POA: Insufficient documentation

## 2011-10-29 DIAGNOSIS — F1010 Alcohol abuse, uncomplicated: Secondary | ICD-10-CM | POA: Insufficient documentation

## 2011-10-29 DIAGNOSIS — J45901 Unspecified asthma with (acute) exacerbation: Secondary | ICD-10-CM | POA: Insufficient documentation

## 2011-10-29 LAB — DIFFERENTIAL
Eosinophils Absolute: 0.8 10*3/uL — ABNORMAL HIGH (ref 0.0–0.7)
Eosinophils Relative: 17 % — ABNORMAL HIGH (ref 0–5)
Lymphs Abs: 2.4 10*3/uL (ref 0.7–4.0)
Monocytes Relative: 13 % — ABNORMAL HIGH (ref 3–12)

## 2011-10-29 LAB — CBC
Hemoglobin: 14.7 g/dL (ref 13.0–17.0)
MCH: 29.3 pg (ref 26.0–34.0)
MCV: 87.8 fL (ref 78.0–100.0)
RBC: 5.01 MIL/uL (ref 4.22–5.81)

## 2011-10-29 LAB — BASIC METABOLIC PANEL
BUN: 9 mg/dL (ref 6–23)
Calcium: 9.2 mg/dL (ref 8.4–10.5)
GFR calc non Af Amer: >90 mL/min (ref >90)
Glucose, Bld: 84 mg/dL (ref 70–99)

## 2011-10-29 MED ORDER — PREDNISONE 20 MG PO TABS
60.0000 mg | ORAL_TABLET | Freq: Once | ORAL | Status: AC
  Administered 2011-10-29: 60 mg via ORAL
  Filled 2011-10-29: qty 3

## 2011-10-29 MED ORDER — ALBUTEROL SULFATE (5 MG/ML) 0.5% IN NEBU
2.5000 mg | INHALATION_SOLUTION | Freq: Once | RESPIRATORY_TRACT | Status: AC
  Administered 2011-10-29: 2.5 mg via RESPIRATORY_TRACT
  Filled 2011-10-29: qty 0.5

## 2011-10-29 MED ORDER — ALBUTEROL SULFATE (5 MG/ML) 0.5% IN NEBU: 5.0000 mg | INHALATION_SOLUTION | Freq: Once | RESPIRATORY_TRACT | Status: DC

## 2011-10-29 MED ORDER — IPRATROPIUM BROMIDE 0.02 % IN SOLN
0.5000 mg | Freq: Once | RESPIRATORY_TRACT | Status: AC
  Administered 2011-10-29: 0.5 mg via RESPIRATORY_TRACT
  Filled 2011-10-29: qty 2.5

## 2011-10-29 MED ORDER — PREDNISONE 20 MG PO TABS: ORAL_TABLET | ORAL | Status: DC

## 2011-10-29 NOTE — ED Notes (Signed)
Pt maintained 95% pulse oximetry while ambulating.

## 2011-10-29 NOTE — ED Notes (Signed)
Pt states he feels better after breathing tx

## 2011-10-29 NOTE — ED Notes (Signed)
Pt co sob and cough x1 week. 

## 2011-10-29 NOTE — ED Provider Notes (Signed)
History     CSN: [id number] Arrival date & time: 10/29/2011 10:03 AM   First MD Initiated Contact with Patient 10/29/11 1040      Chief Complaint  Patient presents with  . Shortness of Breath  . Cough    (Consider location/radiation/quality/duration/timing/severity/associated sxs/prior treatment) HPI Comments: Patient with hx of asthma and COPD c/o persistent shortness of breath and wheezing with occasional cough for one week.  States the shortness of breath is worse with exertion.  States he was seen here mid October and treated for similar symptoms.  He denies chest pain, fever, hemoptysis or vomiting  Patient is a 55 y.o. male presenting with shortness of breath. The history is provided by the patient.  Shortness of Breath  The current episode started 5 to 7 days ago. The onset was gradual. The problem occurs continuously. The problem has been gradually worsening. The problem is moderate. The symptoms are relieved by nothing. The symptoms are aggravated by activity. Associated symptoms include cough, shortness of breath and wheezing. Pertinent negatives include no chest pain, no chest pressure, no orthopnea, no fever, no rhinorrhea and no sore throat. The cough has no precipitants. The cough is non-productive. Nothing relieves the cough. The cough is worsened by activity. There was no intake of a foreign body. He was not exposed to toxic fumes. He has not inhaled smoke recently. He has had intermittent steroid use. He has had no prior hospitalizations. He has had no prior intubations. His past medical history is significant for asthma and past wheezing. He has been behaving normally. Urine output has been normal. There were no sick contacts. Recently, medical care has been given at this facility.    Past Medical History  Diagnosis Date  . COPD (chronic obstructive pulmonary disease)   . Hypertension   . Nicotine addiction   . Alcohol abuse   . Asthma     History reviewed. No  pertinent past surgical history.  Family History  Problem Relation Age of Onset  . Diabetes Mother   . Hypertension Mother   . Diabetes Father   . Hypertension Sister     History  Substance Use Topics  . Smoking status: Former Smoker -- 0.5 packs/day    Types: Cigarettes    Quit date: 09/24/2011  . Smokeless tobacco: Never Used  . Alcohol Use: 10.8 oz/week    18 Cans of beer per week     "drinks beer every once in awhile"      Review of Systems  Constitutional: Negative for fever, chills, appetite change and fatigue.  HENT: Negative for sore throat, rhinorrhea, trouble swallowing, neck pain and neck stiffness.   Respiratory: Positive for cough, shortness of breath and wheezing.   Cardiovascular: Negative for chest pain, palpitations, orthopnea and leg swelling.  Gastrointestinal: Negative for nausea, vomiting, abdominal pain and blood in stool.  Genitourinary: Negative for dysuria.  Musculoskeletal: Negative for myalgias, back pain and arthralgias.  Skin: Negative for rash.  Neurological: Negative for dizziness, weakness and numbness.  Hematological: Does not bruise/bleed easily.  All other systems reviewed and are negative.    Allergies  Review of patient's allergies indicates no known allergies.  Home Medications   Current Outpatient Rx  Name Route Sig Dispense Refill  . ALBUTEROL SULFATE HFA 108 (90 BASE) MCG/ACT IN AERS Inhalation Inhale 2 puffs into the lungs every 4 (four) hours as needed for wheezing. 18 g 6  . ASPIRIN 81 MG PO TBEC  162 mg. Two tabs by mouth  daily    . LISINOPRIL 40 MG PO TABS  TAKE ONE TABLET DAILY FORBLOOD PRESSURE. 30 tablet 3  . PREDNISONE 20 MG PO TABS Oral Take 20 mg by mouth daily. Script filled on 09/27/11 for 15 tabs on tapered dosing, however patient stated he took last tablet about 4 days ago and he did not take them as prescribed       BP 159/105  Pulse 86  Temp(Src) 98.2 F (36.8 C) (Oral)  Resp 20  Ht 6\' 5"  (1.956 m)  Wt  145 lb (65.772 kg)  BMI 17.19 kg/m2  SpO2 96%  Physical Exam  Nursing note and vitals reviewed. Constitutional: He is oriented to person, place, and time. He appears well-developed and well-nourished. No distress.  HENT:  Head: Normocephalic and atraumatic.  Mouth/Throat: Oropharynx is clear and moist. No oropharyngeal exudate.  Eyes: EOM are normal. Pupils are equal, round, and reactive to light.  Neck: Normal range of motion. Neck supple. No JVD present. No thyromegaly present.  Cardiovascular: Normal rate, regular rhythm and normal heart sounds.   Pulmonary/Chest: Effort normal. No stridor. Not tachypneic. No respiratory distress. He has decreased breath sounds. He has wheezes. He has no rhonchi. He has no rales. He exhibits no tenderness.  Abdominal: Soft. He exhibits no distension and no mass. There is no tenderness. There is no rebound and no guarding.  Musculoskeletal: Normal range of motion. He exhibits no edema and no tenderness.  Lymphadenopathy:    He has no cervical adenopathy.  Neurological: He is alert and oriented to person, place, and time. No cranial nerve deficit. He exhibits normal muscle tone. Coordination normal.  Skin: Skin is warm and dry.  Psychiatric: He has a normal mood and affect.    ED Course  Procedures (including critical care time)  Results for orders placed during the hospital encounter of 10/29/11  CBC      Component Value Range   WBC 4.8  4.0 - 10.5 (K/uL)   RBC 5.01  4.22 - 5.81 (MIL/uL)   Hemoglobin 14.7  13.0 - 17.0 (g/dL)   HCT 16.1  09.6 - 04.5 (%)   MCV 87.8  78.0 - 100.0 (fL)   MCH 29.3  26.0 - 34.0 (pg)   MCHC 33.4  30.0 - 36.0 (g/dL)   RDW 40.9  81.1 - 91.4 (%)   Platelets 270  150 - 400 (K/uL)  DIFFERENTIAL      Component Value Range   Neutrophils Relative 19 (*) 43 - 77 (%)   Neutro Abs 0.9 (*) 1.7 - 7.7 (K/uL)   Lymphocytes Relative 49 (*) 12 - 46 (%)   Lymphs Abs 2.4  0.7 - 4.0 (K/uL)   Monocytes Relative 13 (*) 3 - 12 (%)    Monocytes Absolute 0.6  0.1 - 1.0 (K/uL)   Eosinophils Relative 17 (*) 0 - 5 (%)   Eosinophils Absolute 0.8 (*) 0.0 - 0.7 (K/uL)   Basophils Relative 2 (*) 0 - 1 (%)   Basophils Absolute 0.1  0.0 - 0.1 (K/uL)  BASIC METABOLIC PANEL      Component Value Range   Sodium 139  135 - 145 (mEq/L)   Potassium 4.4  3.5 - 5.1 (mEq/L)   Chloride 102  96 - 112 (mEq/L)   CO2 29  19 - 32 (mEq/L)   Glucose, Bld 84  70 - 99 (mg/dL)   BUN 9  6 - 23 (mg/dL)   Creatinine, Ser 7.82  0.50 - 1.35 (mg/dL)  Calcium 9.2  8.4 - 10.5 (mg/dL)   GFR calc non Af Amer >90  >90 (mL/min)   GFR calc Af Amer >90  >90 (mL/min)    Dg Chest 2 View  10/29/2011  *RADIOLOGY REPORT*  Clinical Data: Shortness of breath  CHEST - 2 VIEW  Comparison: 07/24/2011  Findings: The heart size appears normal.  No pleural effusion or pulmonary edema.  No airspace consolidation identified.  There are increased lung volumes.  Review of the visualized osseous structures is unremarkable.  IMPRESSION:  1.  No active cardiopulmonary abnormalities.  Original Report Authenticated By: Rosealee Albee, M.D.       MDM    1:24 PM patient maintained 95% O2 sat while ambulating.  Patient is neutropenic. Hx of neutropenia on previous visit from 12/11.  I have discussed patient hx and lab results with EDP.   I will consult Dr. Lodema Hong regarding further management.  2:08 PM I have consulted with Dr. Lodema Hong regarding the patient's sx's and his neutropenia.  She agrees to see him for follow-up in the office this week.  Patient feeling better.  Agrees to f/u closely with Dr. Lodema Hong.  Will return to ED if sx's worsen.    Patient / Family / Caregiver understand and agree with initial ED impression and plan with expectations set for ED visit.       OUTPATIENT MEDICATIONS PRESCRIBED FROM THE ED:   New Prescriptions   PREDNISONE (DELTASONE) 20 MG TABLET    Take one tablet BID x 5 days     Lilliona Blakeney L. [place], [place] 10/29/11 2119

## 2011-10-31 ENCOUNTER — Encounter: Payer: Self-pay | Admitting: Family Medicine

## 2011-11-01 ENCOUNTER — Ambulatory Visit (INDEPENDENT_AMBULATORY_CARE_PROVIDER_SITE_OTHER): Payer: 59 | Admitting: Family Medicine

## 2011-11-01 ENCOUNTER — Encounter: Payer: Self-pay | Admitting: Family Medicine

## 2011-11-01 VITALS — BP 140/100 | HR 85 | Resp 17 | Ht 70.0 in | Wt 134.4 lb

## 2011-11-01 DIAGNOSIS — D72820 Lymphocytosis (symptomatic): Secondary | ICD-10-CM

## 2011-11-01 DIAGNOSIS — J45901 Unspecified asthma with (acute) exacerbation: Secondary | ICD-10-CM

## 2011-11-01 DIAGNOSIS — I10 Essential (primary) hypertension: Secondary | ICD-10-CM

## 2011-11-01 DIAGNOSIS — J449 Chronic obstructive pulmonary disease, unspecified: Secondary | ICD-10-CM | POA: Insufficient documentation

## 2011-11-01 MED ORDER — PREDNISONE 5 MG PO TABS: 5.0000 mg | ORAL_TABLET | Freq: Every day | ORAL | Status: AC

## 2011-11-01 MED ORDER — BUDESONIDE-FORMOTEROL FUMARATE 80-4.5 MCG/ACT IN AERO: 2.0000 | INHALATION_SPRAY | Freq: Two times a day (BID) | RESPIRATORY_TRACT | Status: DC

## 2011-11-01 MED ORDER — METHYLPREDNISOLONE ACETATE PF 80 MG/ML IJ SUSP
80.0000 mg | Freq: Once | INTRAMUSCULAR | Status: AC
  Administered 2011-11-01: 80 mg via INTRAMUSCULAR

## 2011-11-01 NOTE — Patient Instructions (Addendum)
F/u in 6 to 8 weeks  Your asthma is uncontrolled. Start an additional inhaler symbicort, [place] every day, keep proventil for rescue. Daily prednisone tablets are prescribed for the next 2 weeks.   Work excuse to return on 11/05/2011  You are receiving depo medrol injection in the office today.  You are being referred to a specialist about your blood

## 2011-11-01 NOTE — Assessment & Plan Note (Addendum)
Uncontrolled, depomedrol 80 mg im in office and prednisone 5mg  daily for the next 2 weeks. Start symbicort daily

## 2011-11-01 NOTE — ED Provider Notes (Signed)
Medical screening examination/treatment/procedure(s) were performed by non-physician practitioner and as supervising physician I was immediately available for consultation/collaboration.  Nicoletta Dress. Colon Branch, MD 11/01/11 1040

## 2011-11-01 NOTE — Progress Notes (Signed)
  Subjective:    Patient ID: [name], male    DOB: 10/05/1956, 55 y.o.   MRN: [id number]  HPI Seen in Ed on 10/29/2011 with uncontrolled asthma, just completing high dose steroids, anxious , still wheezing depends only on albuterol.Had been in the ed 10/11 with similar complaint Of note has persistent lymphocytosis and will need heme eval. Blood pressure today is eelvated though he states he is compliant with meds   Review of Systems See HPI Denies recent fever or chills. Denies sinus pressure, nasal congestion, ear pain or sore throat. . Denies chest pains, palpitations and leg swelling Denies abdominal pain, nausea, vomiting,diarrhea or constipation.   Denies dysuria, frequency, hesitancy or incontinence. Denies joint pain, swelling and limitation in mobility. Denies headaches, seizures, numbness, or tingling.  Denies skin break down or rash.        Objective:   Physical Exam Patient alert and oriented and in no cardiopulmonary distress.  HEENT: No facial asymmetry, EOMI, no sinus tenderness,  oropharynx pink and moist.  Neck supple no adenopathy.  Chest: decreased air entry, few scattered wheezes  CVS: S1, S2 no murmurs, no S3.  ABD: Soft non tender. Bowel sounds normal.  Ext: No edema  MS: Adequate ROM spine, shoulders, hips and knees.  Skin: Intact, no ulcerations or rash noted.  Psych: Good eye contact, normal affect. Memory intact not anxious or depressed appearing.  CNS: CN 2-12 intact, power, tone and sensation normal throughout.        Assessment & Plan:

## 2011-11-02 ENCOUNTER — Telehealth: Payer: Self-pay | Admitting: Family Medicine

## 2011-11-02 ENCOUNTER — Telehealth (HOSPITAL_COMMUNITY): Payer: Self-pay | Admitting: Oncology

## 2011-11-02 NOTE — Telephone Encounter (Signed)
The front staff will call then they are ready. He was made aware of this yesterday

## 2011-11-04 NOTE — Assessment & Plan Note (Signed)
Uncontrolled no med change, likely due to recent high dose of steroids

## 2011-11-04 NOTE — Assessment & Plan Note (Signed)
Persistent lymphocytosis, evaluation by hematology to r/o underlying pathology

## 2011-11-26 ENCOUNTER — Ambulatory Visit (HOSPITAL_COMMUNITY): Payer: 59 | Admitting: Oncology

## 2011-11-27 ENCOUNTER — Encounter (HOSPITAL_COMMUNITY): Payer: 59 | Attending: Oncology | Admitting: Oncology

## 2011-11-27 ENCOUNTER — Encounter (HOSPITAL_COMMUNITY): Payer: Self-pay | Admitting: Oncology

## 2011-11-27 VITALS — BP 132/86 | HR 84 | Temp 98.4°F | Ht 70.0 in | Wt 139.7 lb

## 2011-11-27 DIAGNOSIS — D709 Neutropenia, unspecified: Secondary | ICD-10-CM | POA: Insufficient documentation

## 2011-11-27 DIAGNOSIS — J449 Chronic obstructive pulmonary disease, unspecified: Secondary | ICD-10-CM

## 2011-11-27 DIAGNOSIS — F17200 Nicotine dependence, unspecified, uncomplicated: Secondary | ICD-10-CM

## 2011-11-27 DIAGNOSIS — J4489 Other specified chronic obstructive pulmonary disease: Secondary | ICD-10-CM

## 2011-11-27 DIAGNOSIS — D72819 Decreased white blood cell count, unspecified: Secondary | ICD-10-CM

## 2011-11-27 DIAGNOSIS — I10 Essential (primary) hypertension: Secondary | ICD-10-CM

## 2011-11-27 LAB — DIFFERENTIAL
Basophils Relative: 1 % (ref 0–1)
Eosinophils Absolute: 0.5 10*3/uL (ref 0.0–0.7)
Eosinophils Relative: 7 % — ABNORMAL HIGH (ref 0–5)
Monocytes Relative: 10 % (ref 3–12)
Neutrophils Relative %: 42 % — ABNORMAL LOW (ref 43–77)

## 2011-11-27 LAB — CBC
Hemoglobin: 14.4 g/dL (ref 13.0–17.0)
MCH: 28.6 pg (ref 26.0–34.0)
MCHC: 33 g/dL (ref 30.0–36.0)
Platelets: 231 10*3/uL (ref 150–400)

## 2011-11-27 NOTE — Patient Instructions (Signed)
[name]  Discharge Instructions [name]  [id number] Dec 09, 1956   RECOMMENDATIONS MADE BY THE CONSULTANT AND ANY TEST RESULTS WILL BE SENT TO YOUR REFERRING DOCTOR.   EXAM FINDINGS BY MD TODAY AND SIGNS AND SYMPTOMS TO REPORT TO CLINIC OR PRIMARY MD: This could be a hereditary finding.  INSTRUCTIONS GIVEN AND DISCUSSED: We will do Ct scans next week----  SPECIAL INSTRUCTIONS/FOLLOW-UP: Labs in April then to see [name] our physician assistant.   I acknowledge that I have been informed and understand all the instructions given to me and received a copy. I do not have any more questions at this time, but understand that I may call the [facility] at [facility] at [phone] during business hours should I have any further questions or need assistance in obtaining follow-up care.    __________________________________________  _____________  __________ Signature of Patient or Authorized Representative            Date                   Time    __________________________________________ Nurse's Signature

## 2011-11-27 NOTE — Progress Notes (Signed)
[name] presented for labwork. Labs per MD order drawn via Peripheral Line 25 gauge needle inserted in rt arm.   Procedure without incident.  Needle removed intact. Patient tolerated procedure well.

## 2011-11-27 NOTE — Progress Notes (Signed)
CC:   [name]. [name], M.D.  DIAGNOSIS: 1. Neutropenia. 2. Chronic obstructive pulmonary disease with a history of asthma. 3. Longstanding smoking history of a pack a day since at least age 55.     He is now smoking a half pack a day just starting this year. 4. History of full teeth extraction earlier this year. 5. History of excessive alcohol use in years past. 6. History of hypertension, on therapy This is a very pleasant, 55 year old gentleman, whose mother is actually my patient who has a history of breast cancer, who was sent here because of a low white count and a percentage of lymphocytes that was actually on the high side but absolute lymphocyte count is perfectly normal.  It turns out that his white count is really on the low side just because of the neutrophils.  His most recent CBC was on the 12th of November, and his white count was actually normal at 4800, normal platelet count and normal hemoglobin; but again, 49% lymphocytes but only 19% neutrophils giving him an absolute neutrophil count of only 900, and an absolute lymphocyte count of only 2400.  He did have 800 eosinophils and I think that is consistent with his asthma.  Labs in December of last year were almost identical.  He is presently on steroids for his asthma.  He is also on an aspirin, and he also takes Symbicort.  He is also on lisinopril for his blood pressure.  Presently he works at [employer] and has done so for 5 years.  She added some prednisone on the 15th of November, and gave him a Depo- Medrol injection as well.  Unfortunately, he still continues to smoke in spite of her recommendation to quit.  He does not have frequent infections.  He is only rarely on an antibiotic.  The most recent time before this past couple of weeks has been, he states, when he had his teeth extracted 6 to 8 months ago.  He cannot remember the time before that when he was on an antibiotic, but he states that his  asthma has been exacerbated basically in 2012.  He denies weight loss.  No hemoptysis.  No trouble swallowing.  He does not have any joint problems that he is aware of.  He is not aware of a skin rash, changes in his nails, etc.  He drinks a beer only rarely now.  He does not use alcohol in any other form other than the beer.  SOCIAL HISTORY:  He lives with his mother.  He has 1 brother who is older than him, in good health to the best of his knowledge.  He has a sister who is younger and in good health.  His father is deceased from complications of diabetes and gangrene, it sounds like, at age 85 or 30. His mother is 22 and has a history of breast cancer.  She also has a history of a low-end-of-normal neutrophil percentage, interestingly.  PHYSICAL EXAMINATION:  Today, he is 5 feet 10 inches tall, weight is 139 pounds.  He is in no acute distress.  BMI is 20.1.  Blood pressure 132/86, pulse 84 and regular, respirations 18 and unlabored at rest.  He is afebrile, and denies any pain.  He has significant hyperresonance to percussion.  He has active expiratory wheezes bilaterally, especially in the upper lobes.  There are no rales or rubs.  He has no gynecomastia. He has no adenopathy, no thyromegaly.  Teeth are removed.  Tongue is unremarkable.  The throat is clear.  Pupils are small, react minimally to light.  He has normal facial symmetry.  He has a heart which shows a regular rhythm and rate without murmur, rub or gallop.  His abdomen is soft and nontender without organomegaly.  Bowel sounds are normal.  He has no peripheral edema.  He has a questionable tinea rash on his anterior chest, more on the right than the left.  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  He does not have signs of a rheumatological disorder, so I do not think an  ANA and rheumatoid factor are in order.  I would like to bring him back in 4 months, but I have looked up his mother's chart and again, this low-end-of-normal neutrophils on a percentage basis is almost identical to his.  So I think that we just need to watch him.  We will see if he has increase in need for antibiotics.  I have encouraged him, without question, to stop smoking. He does not have clubbing of his fingers at this time, but he does need a low-dose CT in my opinion, because of his longstanding smoking history, and he ought to have one of those every year for 3 years.  We will see him back in 4 months, sooner if need be.    ______________________________ [name]. Nicholas Sleet, MD ESN/MEDQ  D:  11/27/2011  T:  11/27/2011  Job:  [id number]

## 2011-11-27 NOTE — Progress Notes (Signed)
This office note has been dictated.

## 2011-11-28 ENCOUNTER — Encounter: Payer: Self-pay | Admitting: Family Medicine

## 2011-11-28 LAB — FOLATE: Folate: 13.9 ng/mL

## 2011-12-04 ENCOUNTER — Encounter: Payer: Self-pay | Admitting: Family Medicine

## 2011-12-04 ENCOUNTER — Other Ambulatory Visit (HOSPITAL_COMMUNITY): Payer: 59

## 2011-12-04 ENCOUNTER — Ambulatory Visit (INDEPENDENT_AMBULATORY_CARE_PROVIDER_SITE_OTHER): Payer: 59 | Admitting: Family Medicine

## 2011-12-04 VITALS — BP 130/80 | HR 102 | Resp 17 | Ht 70.0 in | Wt 142.8 lb

## 2011-12-04 DIAGNOSIS — Z0000 Encounter for general adult medical examination without abnormal findings: Secondary | ICD-10-CM

## 2011-12-04 DIAGNOSIS — J449 Chronic obstructive pulmonary disease, unspecified: Secondary | ICD-10-CM

## 2011-12-04 DIAGNOSIS — F17200 Nicotine dependence, unspecified, uncomplicated: Secondary | ICD-10-CM

## 2011-12-04 DIAGNOSIS — I10 Essential (primary) hypertension: Secondary | ICD-10-CM

## 2011-12-04 DIAGNOSIS — D72820 Lymphocytosis (symptomatic): Secondary | ICD-10-CM

## 2011-12-04 MED ORDER — BUDESONIDE-FORMOTEROL FUMARATE 160-4.5 MCG/ACT IN AERO: 2.0000 | INHALATION_SPRAY | Freq: Two times a day (BID) | RESPIRATORY_TRACT | Status: DC

## 2011-12-04 NOTE — Assessment & Plan Note (Signed)
Smokes  3 per day wants to quit

## 2011-12-04 NOTE — Patient Instructions (Addendum)
F/u in 6 months.  Labs today, TSH and PSA.  Pls stoop smoking , it is important you do that.Your breathing will improve  Dose increase on the symbicort  I strongly recommend that you get a flu vaccine

## 2011-12-04 NOTE — Assessment & Plan Note (Signed)
Dose increase on symbicort, also pt to quit smoking

## 2011-12-04 NOTE — Assessment & Plan Note (Signed)
Controlled, no change in medication  

## 2011-12-05 ENCOUNTER — Ambulatory Visit (HOSPITAL_COMMUNITY)
Admission: RE | Admit: 2011-12-05 | Discharge: 2011-12-05 | Disposition: A | Discharge status: Home / Self Care | Payer: 59 | Source: Ambulatory Visit | Attending: Oncology | Admitting: Oncology

## 2011-12-05 DIAGNOSIS — R062 Wheezing: Secondary | ICD-10-CM | POA: Insufficient documentation

## 2011-12-05 DIAGNOSIS — J449 Chronic obstructive pulmonary disease, unspecified: Secondary | ICD-10-CM

## 2011-12-05 DIAGNOSIS — J438 Other emphysema: Secondary | ICD-10-CM | POA: Insufficient documentation

## 2011-12-05 DIAGNOSIS — I10 Essential (primary) hypertension: Secondary | ICD-10-CM | POA: Insufficient documentation

## 2011-12-05 NOTE — Assessment & Plan Note (Signed)
Being evaluated by hematology

## 2011-12-05 NOTE — Progress Notes (Signed)
Subjective:     Patient ID: [name], male   DOB: 1956/04/23, 55 y.o.   MRN: [id number]  HPI The PT is here for  and re-annual exam  And evaluation of chronic medical conditions, medication management and review of any available recent lab and radiology data.  Preventive health is updated, specifically  Cancer screening and Immunization.   Questions or concerns regarding consultations or procedures which the PT has had in the interim are  addressed. The PT denies any adverse reactions to current medications since the last visit.  States he is still having significant shortness of breath , but reports that symbicort helps a lot. Denies any recent fever or chills, trying to stop smoking    Review of Systems See HPI Denies recent fever or chills. Denies sinus pressure, nasal congestion, ear pain or sore throat. . Denies chest pains, palpitations and leg swelling Denies abdominal pain, nausea, vomiting,diarrhea or constipation.   Denies dysuria, frequency, hesitancy or incontinence. Denies joint pain, swelling and limitation in mobility. Denies headaches, seizures, numbness, or tingling. Denies depression, anxiety or insomnia. Denies skin break down or rash.        Objective:   Physical Exam Pleasant well nourished male, alert and oriented x 3, in no cardio-pulmonary distress. Afebrile. HEENT No facial trauma or asymetry. Sinuses non tender. EOMI, PERTL, fundoscopic exam is negative for hemorhages or exudates. External ears normal, tympanic membranes clear. Oropharynx moist, no exudate, good dentition. Neck: supple, no adenopathy,JVD or thyromegaly.No bruits.  Chest: Clear to ascultation bilaterally.Decreased air entry throughout, no wheezes or crackles Non tender to palpation  Breast: No asymetry,no masses. No nipple discharge or inversion. No axillary or supraclavicular adenopathy  Cardiovascular system; Heart sounds normal,  S1 and  S2 ,no S3.  No murmur, or  thrill. Apical beat not displaced Peripheral pulses normal.  Abdomen: Soft, non tender, no organomegaly or masses. No bruits. Bowel sounds normal. No guarding, tenderness or rebound.  Rectal:  No mass. guaiac negative stool. Prostate smooth and firm  :   Musculoskeletal exam: Full ROM of spine, hips , shoulders and knees. No deformity ,swelling or crepitus noted. No muscle wasting or atrophy.   Neurologic: Cranial nerves 2 to 12 intact. Power, tone ,sensation and reflexes normal throughout. No disturbance in gait. No tremor.  Skin: Intact, no ulceration, erythema , scaling or rash noted. Pigmentation normal throughout  Psych; Normal mood and affect. Judgement and concentration normal     Assessment:         Plan:

## 2012-01-08 ENCOUNTER — Ambulatory Visit: Payer: 59 | Admitting: Family Medicine

## 2012-02-11 ENCOUNTER — Other Ambulatory Visit: Payer: Self-pay | Admitting: Family Medicine

## 2012-02-28 ENCOUNTER — Encounter: Payer: Self-pay | Admitting: Family Medicine

## 2012-02-28 ENCOUNTER — Ambulatory Visit (INDEPENDENT_AMBULATORY_CARE_PROVIDER_SITE_OTHER): Payer: 59 | Admitting: Family Medicine

## 2012-02-28 VITALS — BP 150/100 | HR 84 | Resp 16 | Ht 70.0 in | Wt 137.1 lb

## 2012-02-28 DIAGNOSIS — T783XXA Angioneurotic edema, initial encounter: Secondary | ICD-10-CM

## 2012-02-28 DIAGNOSIS — I10 Essential (primary) hypertension: Secondary | ICD-10-CM

## 2012-02-28 MED ORDER — PREDNISONE 10 MG PO TABS: ORAL_TABLET | ORAL | Status: DC

## 2012-02-28 MED ORDER — AMLODIPINE BESYLATE 10 MG PO TABS: 10.0000 mg | ORAL_TABLET | Freq: Every day | ORAL | Status: DC

## 2012-02-28 MED ORDER — METHYLPREDNISOLONE SODIUM SUCC 125 MG IJ SOLR
60.0000 mg | Freq: Once | INTRAMUSCULAR | Status: AC
  Administered 2012-02-28: 60 mg via INTRAMUSCULAR

## 2012-02-28 NOTE — Progress Notes (Signed)
  Subjective:    Patient ID: [name], male    DOB: 1956-12-04, 56 y.o.   MRN: [id number]  HPI Facial swelling since 4:00 this morning. Patient noticed after he got off work and ate dinner which was his usual meal of preventive cheese and ham he began to have tingling in his leg and subsequently thereafter his upper lip and left side of face began to swell. He denies any new foods, changes in medications. He denies any new work exposures. He denies shortness of breath or dysphagia. He's had a feeling of muscle tightness in his chest for the past 2 weeks which he has been using Tylenol arthritis and icy hot patches. He denies any current pain. He took his lisinopril approx 1 hour ago   Review of Systems - per above  GEN- denies fatigue, fever, weight loss,weakness, recent illness HEENT- denies eye drainage, change in vision, nasal discharge, CVS- denies chest pain, palpitations RESP- denies SOB, cough, wheeze ABD- denies N/V, change in stools, abd pain        Objective:   Physical Exam GEN- NAD, alert and oriented x3 HEENT- PERRL, EOMI, non injected sclera, MMM,swelling of upper lip and left cheek, decreased sensation over face on left side, uvula midline without swelling, no oral lesions Neck- Supple, normal ROM CVS- RRR, no murmur RESP-CTAB EXT- No edema Pulses- Radial, DP- 2+        Assessment & Plan:

## 2012-02-28 NOTE — Patient Instructions (Signed)
Take the prednisone as directed, start tomorrow STOP THE LISINOPRIL STart the new medication for blood pressure- amlodipine If you have difficulty breathing then go to the Emergency Room  Angioedema Angioedema (AE) is sudden puffiness (swelling) of the skin. It can happen to the face, genitals, and other body parts. You may be reacting to something you are sensitive to (allergic reaction). It may have been passed to you from your parents (heredit ary), or it may develop by itself (acquired). You may also get red itchy patches of skin (hives). Attacks may be mild. Some attacks are life-threatening. Most often, the puffiness happens fast. It often gets better in 24 to 48 hours.   HOME CARE  Carry your emergency allergy medicines with you.   Wear a medical bracelet.   Avoid things that you know will cause this reaction (triggers).  GET HELP RIGHT AWAY IF:    You have trouble breathing.   You have trouble swallowing.   You pass out (faint).   You have another attack.   Your attacks happen more often or get worse.   AE was passed to you by your parents and you want to start having children.  MAKE SURE YOU:    Understand these instructions.   Will watch your condition.   Will get help right away if you are not doing well or get worse.  Document Released: 11/21/2009 Document Revised: 11/22/2011 Document Reviewed: 11/21/2009 [facility] Patient Information 2012 [place], [place].

## 2012-02-29 NOTE — Assessment & Plan Note (Signed)
Will d/c ACE inhibitor as likley culprit with no other new allergens introduced in the past 24 hours. Given IM Solumedrol and prednisone to start tomorrow. Currently no cardiopulmonary distress. Discussed red flags

## 2012-02-29 NOTE — Assessment & Plan Note (Signed)
D/C ACEI, start norvasc

## 2012-03-24 ENCOUNTER — Other Ambulatory Visit (HOSPITAL_COMMUNITY): Payer: 59

## 2012-03-26 ENCOUNTER — Ambulatory Visit (HOSPITAL_COMMUNITY): Payer: 59 | Admitting: Oncology

## 2012-04-04 ENCOUNTER — Encounter (HOSPITAL_COMMUNITY): Payer: 59 | Attending: Oncology

## 2012-04-04 DIAGNOSIS — F17200 Nicotine dependence, unspecified, uncomplicated: Secondary | ICD-10-CM | POA: Insufficient documentation

## 2012-04-04 DIAGNOSIS — D72819 Decreased white blood cell count, unspecified: Secondary | ICD-10-CM

## 2012-04-04 DIAGNOSIS — J4489 Other specified chronic obstructive pulmonary disease: Secondary | ICD-10-CM | POA: Insufficient documentation

## 2012-04-04 DIAGNOSIS — J449 Chronic obstructive pulmonary disease, unspecified: Secondary | ICD-10-CM | POA: Insufficient documentation

## 2012-04-04 DIAGNOSIS — D709 Neutropenia, unspecified: Secondary | ICD-10-CM | POA: Insufficient documentation

## 2012-04-04 DIAGNOSIS — I10 Essential (primary) hypertension: Secondary | ICD-10-CM | POA: Insufficient documentation

## 2012-04-04 LAB — CBC
HCT: 42.1 % (ref 39.0–52.0)
Hemoglobin: 13.9 g/dL (ref 13.0–17.0)
MCH: 28.9 pg (ref 26.0–34.0)
MCV: 87.5 fL (ref 78.0–100.0)
Platelets: 268 10*3/uL (ref 150–400)
RBC: 4.81 MIL/uL (ref 4.22–5.81)

## 2012-04-04 LAB — DIFFERENTIAL
Eosinophils Absolute: 0.4 10*3/uL (ref 0.0–0.7)
Lymphocytes Relative: 46 % (ref 12–46)
Lymphs Abs: 2.9 10*3/uL (ref 0.7–4.0)
Monocytes Relative: 12 % (ref 3–12)
Neutrophils Relative %: 35 % — ABNORMAL LOW (ref 43–77)

## 2012-04-04 NOTE — Progress Notes (Signed)
Labs drawn today for cbc/diff 

## 2012-04-07 ENCOUNTER — Encounter (HOSPITAL_COMMUNITY): Payer: Self-pay | Admitting: Oncology

## 2012-04-07 ENCOUNTER — Encounter (HOSPITAL_BASED_OUTPATIENT_CLINIC_OR_DEPARTMENT_OTHER): Payer: 59 | Admitting: Oncology

## 2012-04-07 VITALS — BP 156/97 | HR 86 | Temp 98.5°F | Wt 138.9 lb

## 2012-04-07 DIAGNOSIS — D72820 Lymphocytosis (symptomatic): Secondary | ICD-10-CM

## 2012-04-07 DIAGNOSIS — D709 Neutropenia, unspecified: Secondary | ICD-10-CM | POA: Insufficient documentation

## 2012-04-07 NOTE — Patient Instructions (Signed)
[facility]  Discharge Instructions  RECOMMENDATIONS MADE BY THE CONSULTANT AND ANY TEST RESULTS WILL BE SENT TO YOUR REFERRING DOCTOR.   Return to clinic in 4 months and 8 months for lab work. To see MD after 8 month labs.   I acknowledge that I have been informed and understand all the instructions given to me and received a copy. I do not have any more questions at this time, but understand that I may call the Specialty [facility] at [facility] at [phone] during business hours should I have any further questions or need assistance in obtaining follow-up care.    __________________________________________  _____________  __________ Signature of Patient or Authorized Representative            Date                   Time    __________________________________________ Nurse's Signature

## 2012-04-07 NOTE — Progress Notes (Signed)
Nicholas Overman, MD, MD [address] [place] [address]  1. Lymphocytosis  CBC, Differential, CBC, Differential  2. Neutropenia      CURRENT THERAPY:Observation  INTERVAL HISTORY: [name] 56 y.o. male returns for  regular  visit for followup of neutropenia.  The patient denies any complaints.  He is here today to review lab work and recent CT scan of chest.  I personally reviewed and went over laboratory results with the patient.  His WBC, RBC, Hgb, and platelets are within normal limits.  The relative neutrophils are mildly decreased, but stable.  The absolute neutrophils are within normal limits.    He denies any recent antibiotics or illnesses.  Dr. Mariel [name] reviews the patient's mother's chart and she has a very similar CBC and differential.  Thus, this is likely hereditary.  I personally reviewed and went over radiographic studies with the patient.  There is emphysema noted on his CT scan, but no pulmonary nodules appreciated.   The patient continues to smoke 1/2 - 1 ppd of tobacco.  I provided the patient with smoking cessation education.    ROS: No TIA's or unusual headaches, no dysphagia.  No prolonged cough. No dyspnea or chest pain on exertion.  No abdominal pain, change in bowel habits, black or bloody stools.  No urinary tract symptoms.  No new or unusual musculoskeletal symptoms.    Past Medical History  Diagnosis Date  . COPD (chronic obstructive pulmonary disease)   . Hypertension   . Nicotine addiction   . Alcohol abuse   . Asthma   . Neutropenia 04/07/2012    has NICOTINE ADDICTION; HYPERTENSION; COPD; MALNUTRITION, MILD; ALCOHOL ABUSE; ALLERGIC RHINITIS CAUSE UNSPECIFIED; Asthma flare; Lymphocytosis; Angioedema of lips; and Neutropenia on his problem list.     is allergic to lisinopril.  [name] does not currently have medications on file.  Past Surgical History  Procedure Date  . Multiple tooth extractions june 2012    Denies  any headaches, dizziness, double vision, fevers, chills, night sweats, nausea, vomiting, diarrhea, constipation, chest pain, heart palpitations, shortness of breath, blood in stool, black tarry stool, urinary pain, urinary burning, urinary frequency, hematuria.   PHYSICAL EXAMINATION  ECOG PERFORMANCE STATUS: 0 - Asymptomatic  Filed Vitals:   04/07/12 0930  BP: 156/97  Pulse: 86  Temp: 98.5 F (36.9 C)    GENERAL:alert, no distress, well nourished, well developed, comfortable and cooperative SKIN: skin color, texture, turgor are normal, no rashes or significant lesions HEAD: Normocephalic, No masses, lesions, tenderness or abnormalities EYES: normal, PERRLA, EOMI, Conjunctiva are pink and non-injected EARS: External ears normal OROPHARYNX:lips, buccal mucosa, and tongue normal and mucous membranes are moist  NECK: supple, trachea midline LYMPH:  no palpable lymphadenopathy, no hepatosplenomegaly BREAST:not examined LUNGS: clear to auscultation and percussion HEART: regular rate & rhythm, no murmurs, no gallops, S1 normal and S2 normal ABDOMEN:abdomen soft, non-tender, normal bowel sounds, no masses or organomegaly and no hepatosplenomegaly BACK: Back symmetric, no curvature., No CVA tenderness EXTREMITIES:less then 2 second capillary refill, no joint deformities, effusion, or inflammation, no edema, no skin discoloration, no cyanosis, clubbing appreciated. NEURO: alert & oriented x 3 with fluent speech, no focal motor/sensory deficits, gait normal    LABORATORY DATA: Results for [name], [name] (MRN [id number]) as of 04/07/2012 09:53  Ref. Range 11/27/2011 13:01 12/04/2011 09:35 04/04/2012 10:05  Folate No range found 13.9    Vitamin B-12 Latest Range: 211-911 pg/mL 449    WBC Latest Range:  4.0-10.5 K/uL 7.7  6.3  RBC Latest Range: 4.22-5.81 MIL/uL 5.03  4.81  HGB Latest Range: 13.0-17.0 g/dL 11.9  14.7  HCT Latest Range: 39.0-52.0 % 43.7  42.1  MCV Latest Range: 78.0-100.0 fL  86.9  87.5  MCH Latest Range: 26.0-34.0 pg 28.6  28.9  MCHC Latest Range: 30.0-36.0 g/dL 82.9  56.2  RDW Latest Range: 11.5-15.5 % 12.9  12.2  Platelets Latest Range: 150-400 K/uL 231  268  Neutrophils Relative Latest Range: 43-77 % 42 (L)  35 (L)  Lymphocytes Relative Latest Range: 12-46 % 41  46  Monocytes Relative Latest Range: 3-12 % 10  12  Eosinophils Relative Latest Range: 0-5 % 7 (H)  6 (H)  Basophils Relative Latest Range: 0-1 % 1  1  NEUT# Latest Range: 1.7-7.7 K/uL 3.2  2.2  Lymphocytes Absolute Latest Range: 0.7-4.0 K/uL 3.2  2.9  Monocytes Absolute Latest Range: 0.1-1.0 K/uL 0.8  0.7  Eosinophils Absolute Latest Range: 0.0-0.7 K/uL 0.5  0.4  Basophils Absolute Latest Range: 0.0-0.1 K/uL 0.0  0.1  TSH Latest Range: 0.350-4.500 uIU/mL  2.560   PSA Latest Range: <=4.00 ng/mL  2.22      PATHOLOGY: 12/07/2011  *RADIOLOGY REPORT*  Clinical Data: Smoking history. Wheezing for 2 months.  Hypertension. 2 asthma attacks in the last month.  CT CHEST WITHOUT CONTRAST  Technique: Multidetector CT imaging of the chest was performed  following the standard protocol without IV contrast.  Comparison: 10/29/2011 plain film. No prior CT.  Findings: Lung windows demonstrate mild centrilobular emphysema.  Focal subpleural bullae or blebs in the upper lobes bilaterally.  Soft tissue windows demonstrate normal heart size without  pericardial or pleural effusion.  Coronary artery atherosclerosis on image 41 is age advanced.  Proximal and mid-LAD. No mediastinal or definite hilar adenopathy,  given limitations of unenhanced CT. A tiny hiatal hernia.  Limited abdominal imaging demonstrates right hepatic lobe sub  centimeter cyst. No acute osseous abnormality. Moderate mid and  lower thoracic spondylosis.  IMPRESSION:  1. Mild centrilobular emphysema without acute process or  suspicious pulmonary nodule.  2. Age advanced coronary artery atherosclerosis. Recommend  assessment of coronary  risk factors and consideration of medical  therapy.  Original Report Authenticated By: [name], M.D.    ASSESSMENT:  1. Neutropenia, heriditary.  Stable  2. Chronic obstructive pulmonary disease with a history of asthma.  3. Longstanding smoking history of a pack a day since at least age 82.  He is now smoking a half pack to a pack a day just starting this year.  4. History of full teeth extraction earlier this year.  5. History of excessive alcohol use in years past.  6. History of hypertension, on therapy   PLAN:  1. I personally reviewed and went over laboratory results with the patient. 2. I personally reviewed and went over radiographic studies with the patient. 3. Lab work in 4 months and 8 months: CBC diff 4. Smoking cessation education provided to the patient. 5. Return in 8 months for follow-up.    All questions were answered. The patient knows to call the clinic with any problems, questions or concerns. We can certainly see the patient much sooner if necessary.  The patient and plan discussed with Glenford Peers, MD and he is in agreement with the aforementioned.  [name]

## 2012-07-25 ENCOUNTER — Other Ambulatory Visit: Payer: Self-pay | Admitting: Family Medicine

## 2012-07-31 ENCOUNTER — Other Ambulatory Visit: Payer: Self-pay | Admitting: Family Medicine

## 2012-08-07 ENCOUNTER — Encounter (HOSPITAL_COMMUNITY): Payer: 59 | Attending: Oncology

## 2012-08-07 DIAGNOSIS — D72820 Lymphocytosis (symptomatic): Secondary | ICD-10-CM | POA: Insufficient documentation

## 2012-08-07 LAB — DIFFERENTIAL
Basophils Absolute: 0.1 10*3/uL (ref 0.0–0.1)
Basophils Relative: 1 % (ref 0–1)
Lymphocytes Relative: 31 % (ref 12–46)
Monocytes Absolute: 0.9 10*3/uL (ref 0.1–1.0)
Monocytes Relative: 13 % — ABNORMAL HIGH (ref 3–12)
Neutro Abs: 3.6 10*3/uL (ref 1.7–7.7)
Neutrophils Relative %: 51 % (ref 43–77)

## 2012-08-07 LAB — CBC
HCT: 43.2 % (ref 39.0–52.0)
Hemoglobin: 14.7 g/dL (ref 13.0–17.0)
RDW: 12.6 % (ref 11.5–15.5)
WBC: 7.1 10*3/uL (ref 4.0–10.5)

## 2012-08-07 NOTE — Progress Notes (Signed)
Labs drawn today for cbc/diff 

## 2012-08-25 ENCOUNTER — Telehealth: Payer: Self-pay | Admitting: Family Medicine

## 2012-08-25 MED ORDER — AMLODIPINE BESYLATE 10 MG PO TABS: ORAL_TABLET | ORAL | Status: DC

## 2012-08-25 NOTE — Telephone Encounter (Signed)
Med sent in. 30 days only.

## 2012-09-16 ENCOUNTER — Telehealth: Payer: Self-pay | Admitting: Family Medicine

## 2012-09-16 MED ORDER — AMLODIPINE BESYLATE 10 MG PO TABS: ORAL_TABLET | ORAL | Status: DC

## 2012-09-16 NOTE — Telephone Encounter (Signed)
Needs appt. 30 days sent

## 2012-11-03 ENCOUNTER — Telehealth: Payer: Self-pay | Admitting: Family Medicine

## 2012-11-03 ENCOUNTER — Other Ambulatory Visit: Payer: Self-pay

## 2012-11-03 MED ORDER — AMLODIPINE BESYLATE 10 MG PO TABS: ORAL_TABLET | ORAL | Status: DC

## 2012-11-03 NOTE — Telephone Encounter (Signed)
Spoke with patient.  Appointment made for 12/18.  Only given 30 day supply.

## 2012-12-04 ENCOUNTER — Encounter: Payer: Self-pay | Admitting: Family Medicine

## 2012-12-04 ENCOUNTER — Ambulatory Visit (INDEPENDENT_AMBULATORY_CARE_PROVIDER_SITE_OTHER): Payer: 59 | Admitting: Family Medicine

## 2012-12-04 VITALS — BP 150/100 | HR 62 | Resp 16 | Ht 70.0 in | Wt 135.0 lb

## 2012-12-04 DIAGNOSIS — Z1211 Encounter for screening for malignant neoplasm of colon: Secondary | ICD-10-CM

## 2012-12-04 DIAGNOSIS — Z0000 Encounter for general adult medical examination without abnormal findings: Secondary | ICD-10-CM

## 2012-12-04 DIAGNOSIS — Z79899 Other long term (current) drug therapy: Secondary | ICD-10-CM

## 2012-12-04 DIAGNOSIS — Z23 Encounter for immunization: Secondary | ICD-10-CM

## 2012-12-04 DIAGNOSIS — F17200 Nicotine dependence, unspecified, uncomplicated: Secondary | ICD-10-CM

## 2012-12-04 DIAGNOSIS — J449 Chronic obstructive pulmonary disease, unspecified: Secondary | ICD-10-CM

## 2012-12-04 DIAGNOSIS — R5383 Other fatigue: Secondary | ICD-10-CM

## 2012-12-04 DIAGNOSIS — R5381 Other malaise: Secondary | ICD-10-CM

## 2012-12-04 DIAGNOSIS — Z139 Encounter for screening, unspecified: Secondary | ICD-10-CM

## 2012-12-04 DIAGNOSIS — I10 Essential (primary) hypertension: Secondary | ICD-10-CM

## 2012-12-04 DIAGNOSIS — E039 Hypothyroidism, unspecified: Secondary | ICD-10-CM

## 2012-12-04 LAB — COMPLETE METABOLIC PANEL WITH GFR
ALT: 17 U/L (ref 0–53)
AST: 21 U/L (ref 0–37)
Alkaline Phosphatase: 49 U/L (ref 39–117)
BUN: 20 mg/dL (ref 6–23)
Calcium: 9.1 mg/dL (ref 8.4–10.5)
Creat: 1.08 mg/dL (ref 0.50–1.35)
Total Bilirubin: 0.4 mg/dL (ref 0.3–1.2)

## 2012-12-04 LAB — CBC WITH DIFFERENTIAL/PLATELET
Basophils Relative: 2 % — ABNORMAL HIGH (ref 0–1)
HCT: 43.5 % (ref 39.0–52.0)
Hemoglobin: 14.8 g/dL (ref 13.0–17.0)
Lymphs Abs: 2.5 10*3/uL (ref 0.7–4.0)
MCHC: 34 g/dL (ref 30.0–36.0)
Monocytes Absolute: 1 10*3/uL (ref 0.1–1.0)
Monocytes Relative: 16 % — ABNORMAL HIGH (ref 3–12)
Neutro Abs: 1.7 10*3/uL (ref 1.7–7.7)
Neutrophils Relative %: 29 % — ABNORMAL LOW (ref 43–77)
RBC: 5.02 MIL/uL (ref 4.22–5.81)

## 2012-12-04 LAB — LIPID PANEL
Cholesterol: 167 mg/dL (ref 0–200)
HDL: 72 mg/dL (ref >39)
LDL Cholesterol: 81 mg/dL (ref 0–99)
Total CHOL/HDL Ratio: 2.3 Ratio
Triglycerides: 69 mg/dL (ref <150)
VLDL: 14 mg/dL (ref 0–40)

## 2012-12-04 LAB — TSH: TSH: 4.709 u[IU]/mL — ABNORMAL HIGH (ref 0.350–4.500)

## 2012-12-04 MED ORDER — AMLODIPINE BESYLATE 10 MG PO TABS: ORAL_TABLET | ORAL | Status: DC

## 2012-12-04 MED ORDER — ALBUTEROL SULFATE HFA 108 (90 BASE) MCG/ACT IN AERS: INHALATION_SPRAY | RESPIRATORY_TRACT | Status: DC

## 2012-12-04 MED ORDER — BUDESONIDE-FORMOTEROL FUMARATE 160-4.5 MCG/ACT IN AERO: 2.0000 | INHALATION_SPRAY | Freq: Two times a day (BID) | RESPIRATORY_TRACT | Status: DC

## 2012-12-04 MED ORDER — TRIAMTERENE-HCTZ 37.5-25 MG PO TABS: 1.0000 | ORAL_TABLET | Freq: Every day | ORAL | Status: DC

## 2012-12-04 NOTE — Patient Instructions (Addendum)
F/u in end February.  Congrats on setting quit date for Dec 31, you need to stop smoking to reduce your risk of stroke, heart disease and cancer, You will get additional help information  Pneumonia vaccine today  Please cut back on beer and eat more often larger quantities , you need to gain weight  New additional medication for blood pressure which is still too high  Labs today, cbc, lipid, cmp and EGFR, PSA and TSh and vit D  Rectal exam today was normal  Start 1 multivitamin once daily and add ensure 2 cans daily

## 2012-12-05 LAB — PSA: PSA: 1 ng/mL (ref <=4.00)

## 2012-12-05 LAB — VITAMIN D 25 HYDROXY (VIT D DEFICIENCY, FRACTURES): Vit D, 25-Hydroxy: 20 ng/mL — ABNORMAL LOW (ref 30–89)

## 2012-12-08 ENCOUNTER — Encounter (HOSPITAL_COMMUNITY): Payer: 59 | Attending: Oncology

## 2012-12-08 DIAGNOSIS — D72820 Lymphocytosis (symptomatic): Secondary | ICD-10-CM

## 2012-12-08 DIAGNOSIS — I10 Essential (primary) hypertension: Secondary | ICD-10-CM | POA: Insufficient documentation

## 2012-12-08 DIAGNOSIS — J449 Chronic obstructive pulmonary disease, unspecified: Secondary | ICD-10-CM | POA: Insufficient documentation

## 2012-12-08 DIAGNOSIS — F17200 Nicotine dependence, unspecified, uncomplicated: Secondary | ICD-10-CM | POA: Insufficient documentation

## 2012-12-08 DIAGNOSIS — J4489 Other specified chronic obstructive pulmonary disease: Secondary | ICD-10-CM | POA: Insufficient documentation

## 2012-12-08 DIAGNOSIS — D709 Neutropenia, unspecified: Secondary | ICD-10-CM | POA: Insufficient documentation

## 2012-12-08 LAB — CBC
Hemoglobin: 15.1 g/dL (ref 13.0–17.0)
MCH: 29.4 pg (ref 26.0–34.0)
Platelets: 266 10*3/uL (ref 150–400)
RBC: 5.14 MIL/uL (ref 4.22–5.81)
WBC: 5.5 10*3/uL (ref 4.0–10.5)

## 2012-12-08 LAB — DIFFERENTIAL
Lymphocytes Relative: 44 % (ref 12–46)
Lymphs Abs: 2.4 10*3/uL (ref 0.7–4.0)
Monocytes Relative: 13 % — ABNORMAL HIGH (ref 3–12)
Neutrophils Relative %: 29 % — ABNORMAL LOW (ref 43–77)

## 2012-12-08 NOTE — Progress Notes (Signed)
Labs drawn today for cbc/diff 

## 2012-12-11 ENCOUNTER — Ambulatory Visit (HOSPITAL_COMMUNITY): Payer: 59 | Admitting: Oncology

## 2012-12-12 ENCOUNTER — Encounter (HOSPITAL_BASED_OUTPATIENT_CLINIC_OR_DEPARTMENT_OTHER): Payer: 59 | Admitting: Oncology

## 2012-12-12 ENCOUNTER — Encounter (HOSPITAL_COMMUNITY): Payer: Self-pay | Admitting: Oncology

## 2012-12-12 VITALS — BP 187/125 | HR 83 | Temp 98.0°F | Resp 18 | Wt 134.3 lb

## 2012-12-12 DIAGNOSIS — D709 Neutropenia, unspecified: Secondary | ICD-10-CM

## 2012-12-12 DIAGNOSIS — D700 Congenital agranulocytosis: Secondary | ICD-10-CM

## 2012-12-12 DIAGNOSIS — F17200 Nicotine dependence, unspecified, uncomplicated: Secondary | ICD-10-CM

## 2012-12-12 NOTE — Patient Instructions (Addendum)
[facility] Discharge Instructions  RECOMMENDATIONS MADE BY THE CONSULTANT AND ANY TEST RESULTS WILL BE SENT TO YOUR REFERRING PHYSICIAN.  EXAM FINDINGS BY THE PHYSICIAN TODAY AND SIGNS OR SYMPTOMS TO REPORT TO CLINIC OR PRIMARY PHYSICIAN: Exam and discussion by PA.  You are doing well.  MEDICATIONS PRESCRIBED:  none  INSTRUCTIONS GIVEN AND DISCUSSED: Report recurring infections or fevers.  SPECIAL INSTRUCTIONS/FOLLOW-UP: Blood work in 5 months and in 10 months and to see MD after labs in 10 months.  Thank you for choosing [facility] to provide your oncology and hematology care.  To afford each patient quality time with our providers, please arrive at least 15 minutes before your scheduled appointment time.  With your help, our goal is to use those 15 minutes to complete the necessary work-up to ensure our physicians have the information they need to help with your evaluation and healthcare recommendations.    Effective January 1st, 2014, we ask that you re-schedule your appointment with our physicians should you arrive 10 or more minutes late for your appointment.  We strive to give you quality time with our providers, and arriving late affects you and other patients whose appointments are after yours.    Again, thank you for choosing [facility].  Our hope is that these requests will decrease the amount of time that you wait before being seen by our physicians.       _____________________________________________________________  I acknowledge that I have been informed and understand all the instructions given to me and received a copy. I do not have anymore questions at this time but understand that I may call the Cancer Center at [facility] at [phone] during business hours should I have any further questions or need assistance in obtaining follow-up care.

## 2012-12-12 NOTE — Progress Notes (Signed)
Nicholas Overman, MD [address], [address] [place] [address]  1. Neutropenia     CURRENT THERAPY: Observation  INTERVAL HISTORY: [name] 56 y.o. male returns for  regular  visit for followup of neutropenia.  Dr. Mariel Sleet reviews the patient's mother's chart and she has a very similar CBC and differential. Thus, this is likely hereditary.  Patient is accompanied by his brother.   [name] recently saw his PCP and she changed his BP medications.  He has not yet picked up that Rx and I recommended he do so and follow-up with his PCP, Dr. Lodema Hong.   He denies any illnesses requiring antibiotics since our last encounter.    He received a pneumovax by his PCP in the middle of this month.  I recommended a flu shot and he reports that he will get that done at [facility].  I personally reviewed and went over laboratory results with the patient.  His CBC is WNL and his ANC has been within normal limits since December 2012.  I provided the patient education regarding Neutrophils and their role in the immune system. Therefore, the patient's ANC is stable.   He continues to smoke about 6 cigarettes daily, but he has made a promise to his PCP to quite smoking at the beginning of the New Year.  I have encouraged the patient to continue with this plan.   Complete ROS questioning is otherwise negative.  Weight fluctuates from 145 lbs to 132 lbs dating back to 2009 and is overall stable .    Past Medical History  Diagnosis Date  . COPD (chronic obstructive pulmonary disease)   . Hypertension   . Nicotine addiction   . Alcohol abuse   . Asthma   . Neutropenia 04/07/2012    has NICOTINE ADDICTION; HYPERTENSION; COPD; MALNUTRITION, MILD; ALCOHOL ABUSE; ALLERGIC RHINITIS CAUSE UNSPECIFIED; Asthma flare; Lymphocytosis; Angioedema of lips; and Neutropenia on his problem list.     is allergic to lisinopril.  [name] does not currently have medications on file.  Past  Surgical History  Procedure Date  . Multiple tooth extractions june 2012    Denies any headaches, dizziness, double vision, fevers, chills, night sweats, nausea, vomiting, diarrhea, constipation, chest pain, heart palpitations, shortness of breath, blood in stool, black tarry stool, urinary pain, urinary burning, urinary frequency, hematuria.   PHYSICAL EXAMINATION  ECOG PERFORMANCE STATUS: 0 - Asymptomatic  Filed Vitals:   12/12/12 1005  BP: 187/125  Pulse: 83  Temp: 98 F (36.7 C)  Resp: 18    GENERAL:alert, no distress, well nourished, well developed, comfortable, cooperative and smiling SKIN: skin color, texture, turgor are normal, no rashes or significant lesions HEAD: Normocephalic, No masses, lesions, tenderness or abnormalities EYES: normal, Conjunctiva are pink and non-injected EARS: External ears normal OROPHARYNX:lips, buccal mucosa, and tongue normal and mucous membranes are moist  NECK: supple, no adenopathy, thyroid normal size, non-tender, without nodularity, no stridor, non-tender, trachea midline LYMPH:  no palpable lymphadenopathy, no hepatosplenomegaly BREAST:not examined LUNGS: positive findings: wheezing  right mid posterior, right lower posterior, left mid posterior and left lower posterior HEART: regular rate & rhythm, no murmurs, no gallops, S1 normal and S2 normal ABDOMEN:abdomen soft, non-tender, normal bowel sounds, no masses or organomegaly and no hepatosplenomegaly BACK: Back symmetric, no curvature., No CVA tenderness EXTREMITIES:less then 2 second capillary refill, no joint deformities, effusion, or inflammation, no edema, no skin discoloration, no cyanosis, positive findings:  Fingernail clubbing  NEURO: alert & oriented x  3 with fluent speech, no focal motor/sensory deficits, gait normal   LABORATORY DATA: CBC    Component Value Date/Time   WBC 5.5 12/08/2012 0934   RBC 5.14 12/08/2012 0934   HGB 15.1 12/08/2012 0934   HCT 44.5 12/08/2012  0934   PLT 266 12/08/2012 0934   MCV 86.6 12/08/2012 0934   MCH 29.4 12/08/2012 0934   MCHC 33.9 12/08/2012 0934   RDW 12.4 12/08/2012 0934   LYMPHSABS 2.4 12/08/2012 0934   MONOABS 0.7 12/08/2012 0934   EOSABS 0.6 12/08/2012 0934   BASOSABS 0.1 12/08/2012 0934      Chemistry      Component Value Date/Time   NA 134* 12/04/2012 0923   K 4.3 12/04/2012 0923   CL 102 12/04/2012 0923   CO2 21 12/04/2012 0923   BUN 20 12/04/2012 0923   CREATININE 1.08 12/04/2012 0923   CREATININE 0.89 10/29/2011 1120      Component Value Date/Time   CALCIUM 9.1 12/04/2012 0923   ALKPHOS 49 12/04/2012 0923   AST 21 12/04/2012 0923   ALT 17 12/04/2012 0923   BILITOT 0.4 12/04/2012 0923      Results for [name] (MRN [id number]) as of 12/12/2012 10:13  Ref. Range 04/04/2012 10:05 08/07/2012 09:19 12/04/2012 09:23 12/04/2012 11:47 12/08/2012 09:34  WBC Latest Range: 4.0-10.5 K/uL 6.3 7.1 6.1  5.5  RBC Latest Range: 4.22-5.81 MIL/uL 4.81 4.99 5.02  5.14  Hemoglobin Latest Range: 13.0-17.0 g/dL 40.9 81.1 91.4  78.2  HCT Latest Range: 39.0-52.0 % 42.1 43.2 43.5  44.5  MCV Latest Range: 78.0-100.0 fL 87.5 86.6 86.7  86.6  MCH Latest Range: 26.0-34.0 pg 28.9 29.5 29.5  29.4  MCHC Latest Range: 30.0-36.0 g/dL 95.6 21.3 08.6  57.8  RDW Latest Range: 11.5-15.5 % 12.2 12.6 12.6  12.4  Platelets Latest Range: 150-400 K/uL 268 220 251  266  Neutrophils Relative Latest Range: 43-77 % 35 (L) 51 29 (L)  29 (L)  Lymphocytes Relative Latest Range: 12-46 % 46 31 41  44  Monocytes Relative Latest Range: 3-12 % 12 13 (H) 16 (H)  13 (H)  Eosinophils Relative Latest Range: 0-5 % 6 (H) 4 12 (H)  11 (H)  Basophils Relative Latest Range: 0-1 % 1 1 2  (H)  2 (H)  NEUT# Latest Range: 1.7-7.7 K/uL 2.2 3.6 1.7  1.6 (L)  Lymphocytes Absolute Latest Range: 0.7-4.0 K/uL 2.9 2.2 2.5  2.4  Monocytes Absolute Latest Range: 0.1-1.0 K/uL 0.7 0.9 1.0  0.7  Eosinophils Absolute Latest Range: 0.0-0.7 K/uL 0.4 0.3 0.7  0.6    Basophils Absolute Latest Range: 0.0-0.1 K/uL 0.1 0.1 0.1  0.1      ASSESSMENT:  1. Neutropenia, heriditary. Stable. Not requiring increased antibiotics.  2. Chronic obstructive pulmonary disease with a history of asthma.  3. Longstanding smoking history of a pack a day since at least age 14. He is now smoking a half pack to a pack a day just starting this year.  4. History of full teeth extraction earlier this year.  5. History of excessive alcohol use in years past.  6. Hypertension, on therapy   PLAN:  1. I personally reviewed and went over laboratory results with the patient. 2. Recommended the patient follow-up with PCP who manages the patient's BP.  BP today in the clinic was 187/125. 3. Labs in 5 months and 10 months: CBC diff 4. Smoking cessation education provided.  5. Return in 10 months for follow-up.    All  questions were answered. The patient knows to call the clinic with any problems, questions or concerns. We can certainly see the patient much sooner if necessary.  Patient and plan will be discussed with Dr. Mariel Sleet in the near future.  KEFALAS,THOMAS

## 2012-12-22 DIAGNOSIS — Z0000 Encounter for general adult medical examination without abnormal findings: Secondary | ICD-10-CM | POA: Insufficient documentation

## 2012-12-22 NOTE — Assessment & Plan Note (Signed)
Pt has decided to quit and a date is set. Cessation counseling and tips to assist with quitting a re discussed at the visit. counselling i done fo 5 minutes

## 2012-12-22 NOTE — Progress Notes (Signed)
  Subjective:    Patient ID: [name], male    DOB: 1956-05-21, 57 y.o.   MRN: [id number]  HPI Pt is in for annual exam He has no specific complaints or concerns. He has decided to quit smoking on 12/31, and indeed has been reducing use gradually. He continues to drink beer regularly, the combination of this and nicotine is interfering with his appetite which is poor and he remains slightly underweight. He denies any change in bowel movements, and has had no visible blood in the stool. He reports compliance with his medication, and has had no recent asthma flare   Review of Systems See HPI Denies recent fever or chills. Denies sinus pressure, nasal congestion, ear pain or sore throat. Denies chest congestion, productive cough or wheezing. Denies chest pains, palpitations and leg swelling Denies abdominal pain, nausea, vomiting,diarrhea or constipation.   Denies dysuria, frequency, hesitancy or incontinence. Denies joint pain, swelling and limitation in mobility. Denies headaches, seizures, numbness, or tingling. Denies depression, anxiety or insomnia. Denies skin break down or rash.        Objective:   Physical Exam Pleasant well nourished male, alert and oriented x 3, in no cardio-pulmonary distress. Afebrile. HEENT No facial trauma or asymetry. Sinuses non tender. EOMI, PERTL, fundoscopic exam is negative for hemorhages or exudates. External ears normal, tympanic membranes clear. Oropharynx moist, no exudate, poor dentition. Neck: supple, no adenopathy,JVD or thyromegaly.No bruits.  Chest: Clear to ascultation bilaterally.No crackles or wheezes.Decreased air entry throughout Non tender to palpation  Breast: No asymetry,no masses. No nipple discharge or inversion. No axillary or supraclavicular adenopathy  Cardiovascular system; Heart sounds normal,  S1 and  S2 ,no S3.  No murmur, or thrill. Apical beat not displaced Peripheral pulses  normal.  Abdomen: Soft, non tender, no organomegaly or masses. No bruits. Bowel sounds normal. No guarding, tenderness or rebound.  Rectal:  No mass. guaiac negative stool. Prostate smooth and firm, no nodules palpated, not enlarged   Musculoskeletal exam: Full ROM of spine, hips , shoulders and knees. No deformity ,swelling or crepitus noted. No muscle wasting or atrophy.   Neurologic: Cranial nerves 2 to 12 intact. Power, tone ,sensation and reflexes normal throughout. No disturbance in gait. No tremor.  Skin: Intact, no ulceration, erythema , scaling or rash noted. Pigmentation normal throughout  Psych; Normal mood and affect. Judgement and concentration normal         Assessment & Plan:

## 2012-12-22 NOTE — Assessment & Plan Note (Addendum)
Annual exam completed as documented. Pt is underweight, and is encouraged to quit smoking as well as alcohol use , he reports insufficeient food intake, stating he is not hungry. He has had a colonoscopy and denies a change in bowel movements in recent times Pneumovac administered die to asthma

## 2012-12-22 NOTE — Assessment & Plan Note (Signed)
Uncontrolled, additional med added DASH diet and commitment to daily physical activity for a minimum of 30 minutes discussed and encouraged, as a part of hypertension management. The importance of attaining a healthy weight is also discussed.  

## 2012-12-25 ENCOUNTER — Other Ambulatory Visit: Payer: Self-pay

## 2012-12-25 DIAGNOSIS — J449 Chronic obstructive pulmonary disease, unspecified: Secondary | ICD-10-CM

## 2013-01-16 ENCOUNTER — Encounter (HOSPITAL_COMMUNITY): Payer: 59

## 2013-02-10 ENCOUNTER — Encounter: Payer: Self-pay | Admitting: Family Medicine

## 2013-02-10 ENCOUNTER — Ambulatory Visit (INDEPENDENT_AMBULATORY_CARE_PROVIDER_SITE_OTHER): Payer: 59 | Admitting: Family Medicine

## 2013-02-10 VITALS — BP 160/110 | HR 78 | Resp 16 | Ht 70.0 in | Wt 135.0 lb

## 2013-02-10 DIAGNOSIS — F17200 Nicotine dependence, unspecified, uncomplicated: Secondary | ICD-10-CM

## 2013-02-10 DIAGNOSIS — J45909 Unspecified asthma, uncomplicated: Secondary | ICD-10-CM

## 2013-02-10 DIAGNOSIS — F418 Other specified anxiety disorders: Secondary | ICD-10-CM | POA: Insufficient documentation

## 2013-02-10 DIAGNOSIS — I10 Essential (primary) hypertension: Secondary | ICD-10-CM

## 2013-02-10 DIAGNOSIS — F341 Dysthymic disorder: Secondary | ICD-10-CM

## 2013-02-10 MED ORDER — CITALOPRAM HYDROBROMIDE 10 MG PO TABS: 10.0000 mg | ORAL_TABLET | Freq: Every day | ORAL | Status: DC

## 2013-02-10 MED ORDER — TRIAMTERENE-HCTZ 75-50 MG PO TABS: 1.0000 | ORAL_TABLET | Freq: Every day | ORAL | Status: DC

## 2013-02-10 NOTE — Patient Instructions (Addendum)
F/u in 2 month  Blood pressure is high, dose increase in maxzide 50mg  one daily, OK to take TWO 25mg  daily till done. Continue amlodipine as before.  Cut down alcohol to24 ounces daily please.  Please set a date to stop smoking, new medication for anxiety will help with this  CMP in 2 month before visit.  Increase amount of food you eat and eat at least 3 full meals every day please

## 2013-02-17 NOTE — Progress Notes (Signed)
  Subjective:    Patient ID: [name], male    DOB: [birth year], 57 y.o.   MRN: [id number]  HPI The PT is here for follow up and re-evaluation of chronic medical conditions, medication management and review of any available recent lab and radiology data.  Preventive health is updated, specifically  Cancer screening and Immunization.    The PT denies any adverse reactions to current medications since the last visit.        Review of Systems See HPI Denies recent fever or chills. Denies sinus pressure, nasal congestion, ear pain or sore throat. Denies chest congestion, productive cough or wheezing. Denies chest pains, palpitations and leg swelling Denies abdominal pain, nausea, vomiting,diarrhea or constipation.   Denies dysuria, frequency, hesitancy or incontinence. Denies joint pain, swelling and limitation in mobility. Denies headaches, seizures, numbness, or tingling. C/o depression, anxiety and excessive stress on the job, states he has a problem with a few people on the job, he however has learned to walk way, but this in itself is very stressful. Denies skin break down or rash.        Objective:   Physical Exam Patient alert and oriented and in no cardiopulmonary distress.  HEENT: No facial asymmetry, EOMI, no sinus tenderness,  oropharynx pink and moist.  Neck supple no adenopathy.  Chest: Clear to auscultation bilaterally.decreased though adequate air entry  CVS: S1, S2 no murmurs, no S3.  ABD: Soft non tender. Bowel sounds normal.  Ext: No edema  MS: Adequate ROM spine, shoulders, hips and knees.  Skin: Intact, no ulcerations or rash noted.  Psych: Good eye contact, normal affect. Memory intact  anxiousnot r depressed appearing.  CNS: CN 2-12 intact, power, tone and sensation normal throughout.        Assessment & Plan:

## 2013-02-17 NOTE — Assessment & Plan Note (Signed)
Currently stable, no recen flares, continue meds

## 2013-02-17 NOTE — Assessment & Plan Note (Signed)
Pt to start citalopram an dthis will also help with reduced dependence on nicotine

## 2013-02-17 NOTE — Assessment & Plan Note (Signed)
Uncontrolled, dose inrease on medication DASH diet and commitment to daily physical activity for a minimum of 30 minutes discussed and encouraged, as a part of hypertension management. The importance of attaining a healthy weight is also discussed.

## 2013-02-17 NOTE — Assessment & Plan Note (Signed)
Unchaned. Patient counseled for approximately 5 minutes regarding the health risks of ongoing nicotine use, specifically all types of cancer, heart disease, stroke and respiratory failure. The options available for help with cessation ,the behavioral changes to assist the process, and the option to either gradully reduce usage  Or abruptly stop.is also discussed. Pt is also encouraged to set specific goals in number of cigarettes used daily, as well as to set a quit date.

## 2013-02-20 ENCOUNTER — Encounter (HOSPITAL_COMMUNITY): Payer: 59

## 2013-04-15 ENCOUNTER — Ambulatory Visit (INDEPENDENT_AMBULATORY_CARE_PROVIDER_SITE_OTHER): Payer: 59 | Admitting: Family Medicine

## 2013-04-15 ENCOUNTER — Encounter: Payer: Self-pay | Admitting: Family Medicine

## 2013-04-15 VITALS — BP 110/80 | HR 86 | Resp 18 | Ht 70.0 in | Wt 135.1 lb

## 2013-04-15 DIAGNOSIS — J45909 Unspecified asthma, uncomplicated: Secondary | ICD-10-CM

## 2013-04-15 DIAGNOSIS — J4489 Other specified chronic obstructive pulmonary disease: Secondary | ICD-10-CM

## 2013-04-15 DIAGNOSIS — F418 Other specified anxiety disorders: Secondary | ICD-10-CM

## 2013-04-15 DIAGNOSIS — J449 Chronic obstructive pulmonary disease, unspecified: Secondary | ICD-10-CM

## 2013-04-15 DIAGNOSIS — I10 Essential (primary) hypertension: Secondary | ICD-10-CM

## 2013-04-15 DIAGNOSIS — F17200 Nicotine dependence, unspecified, uncomplicated: Secondary | ICD-10-CM

## 2013-04-15 DIAGNOSIS — F341 Dysthymic disorder: Secondary | ICD-10-CM

## 2013-04-15 NOTE — Progress Notes (Signed)
  Subjective:    Patient ID: [name], male    DOB: Jun 10, 1956, 57 y.o.   MRN: [id number]  HPI The PT is here for follow up and re-evaluation of chronic medical conditions, medication management and review of any available recent lab and radiology data.  Preventive health is updated, specifically  Cancer screening and Immunization.   Questions or concerns regarding consultations or procedures which the PT has had in the interim are  addressed. The PT denies any adverse reactions to current medications since the last visit.Has no adverse side effects with citalopram and reports less anxiety  There are no new concerns.  There are no specific complaints       Review of Systems See HPI Denies recent fever or chills. Denies sinus pressure, nasal congestion, ear pain or sore throat. Denies chest congestion, productive cough or wheezing. Denies chest pains, palpitations and leg swelling Denies abdominal pain, nausea, vomiting,diarrhea or constipation.   Denies dysuria, frequency, hesitancy or incontinence. Denies joint pain, swelling and limitation in mobility. Denies headaches, seizures, numbness, or tingling. Denies depression, anxiety or insomnia. Denies skin break down or rash.        Objective:   Physical Exam  Patient alert and oriented and in no cardiopulmonary distress.  HEENT: No facial asymmetry, EOMI, no sinus tenderness,  oropharynx pink and moist.  Neck supple no adenopathy.  Chest: Clear to auscultation bilaterally.  CVS: S1, S2 no murmurs, no S3.Marked clubbing of all digits  ABD: Soft non tender. Bowel sounds normal.  Ext: No edema  MS: Adequate ROM spine, shoulders, hips and knees.  Skin: Intact, no ulcerations or rash noted.  Psych: Good eye contact, normal affect. Memory intact not anxious or depressed appearing.  CNS: CN 2-12 intact, power, tone and sensation normal throughout.       Assessment & Plan:

## 2013-04-15 NOTE — Patient Instructions (Addendum)
F/u in 5.5 month, please call if you need me before  Blood pressure today is excellent , no med changes.  Please set a quit date to stop smoking to reduce youyr risk of stroke and heart disease and cancer

## 2013-04-16 NOTE — Assessment & Plan Note (Signed)
counselled to quit nicotine, no med change

## 2013-04-16 NOTE — Assessment & Plan Note (Signed)
Controlled, no change in medication DASH diet and commitment to daily physical activity for a minimum of 30 minutes discussed and encouraged, as a part of hypertension management. The importance of attaining a healthy weight is also discussed.  

## 2013-04-16 NOTE — Assessment & Plan Note (Signed)
Improved on medciation, continue same med

## 2013-04-16 NOTE — Assessment & Plan Note (Signed)

## 2013-04-16 NOTE — Assessment & Plan Note (Signed)
Stable and controlled, no recent flares. Continue current meds

## 2013-05-15 ENCOUNTER — Encounter (HOSPITAL_COMMUNITY): Payer: 59

## 2013-05-19 ENCOUNTER — Encounter (HOSPITAL_COMMUNITY): Payer: 59 | Attending: Oncology

## 2013-05-19 DIAGNOSIS — D700 Congenital agranulocytosis: Secondary | ICD-10-CM

## 2013-05-19 DIAGNOSIS — D709 Neutropenia, unspecified: Secondary | ICD-10-CM

## 2013-05-19 LAB — CBC WITH DIFFERENTIAL/PLATELET
Basophils Absolute: 0.1 10*3/uL (ref 0.0–0.1)
Basophils Relative: 1 % (ref 0–1)
Eosinophils Absolute: 0.2 10*3/uL (ref 0.0–0.7)
Eosinophils Relative: 3 % (ref 0–5)
HCT: 40 % (ref 39.0–52.0)
Hemoglobin: 14.4 g/dL (ref 13.0–17.0)
Lymphocytes Relative: 43 % (ref 12–46)
Lymphs Abs: 2.6 10*3/uL (ref 0.7–4.0)
MCH: 29.8 pg (ref 26.0–34.0)
MCHC: 36 g/dL (ref 30.0–36.0)
MCV: 82.8 fL (ref 78.0–100.0)
Monocytes Absolute: 0.8 10*3/uL (ref 0.1–1.0)
Monocytes Relative: 14 % — ABNORMAL HIGH (ref 3–12)
Neutro Abs: 2.4 10*3/uL (ref 1.7–7.7)
Neutrophils Relative %: 39 % — ABNORMAL LOW (ref 43–77)
Platelets: 235 10*3/uL (ref 150–400)
RBC: 4.83 MIL/uL (ref 4.22–5.81)
RDW: 12.8 % (ref 11.5–15.5)
WBC: 6.2 10*3/uL (ref 4.0–10.5)

## 2013-05-19 NOTE — Progress Notes (Signed)
Labs drawn today for cbc/diff 

## 2013-06-02 ENCOUNTER — Telehealth: Payer: Self-pay | Admitting: Family Medicine

## 2013-06-02 DIAGNOSIS — J449 Chronic obstructive pulmonary disease, unspecified: Secondary | ICD-10-CM

## 2013-06-02 MED ORDER — ALBUTEROL SULFATE HFA 108 (90 BASE) MCG/ACT IN AERS: INHALATION_SPRAY | RESPIRATORY_TRACT | Status: DC

## 2013-06-02 MED ORDER — BUDESONIDE-FORMOTEROL FUMARATE 160-4.5 MCG/ACT IN AERO: 2.0000 | INHALATION_SPRAY | Freq: Two times a day (BID) | RESPIRATORY_TRACT | Status: DC

## 2013-06-02 NOTE — Telephone Encounter (Signed)
Inhalers sent in to [facility]

## 2013-08-03 ENCOUNTER — Other Ambulatory Visit: Payer: Self-pay | Admitting: Family Medicine

## 2013-09-18 ENCOUNTER — Encounter (HOSPITAL_COMMUNITY): Payer: 59 | Attending: Hematology and Oncology

## 2013-09-18 DIAGNOSIS — D700 Congenital agranulocytosis: Secondary | ICD-10-CM

## 2013-09-18 DIAGNOSIS — D709 Neutropenia, unspecified: Secondary | ICD-10-CM | POA: Insufficient documentation

## 2013-09-18 DIAGNOSIS — D72820 Lymphocytosis (symptomatic): Secondary | ICD-10-CM | POA: Insufficient documentation

## 2013-09-18 LAB — CBC WITH DIFFERENTIAL/PLATELET
Basophils Absolute: 0.1 10*3/uL (ref 0.0–0.1)
Basophils Relative: 1 % (ref 0–1)
Eosinophils Absolute: 0.5 10*3/uL (ref 0.0–0.7)
Hemoglobin: 14 g/dL (ref 13.0–17.0)
Lymphocytes Relative: 36 % (ref 12–46)
MCH: 30 pg (ref 26.0–34.0)
MCHC: 34.7 g/dL (ref 30.0–36.0)
MCV: 86.5 fL (ref 78.0–100.0)
Monocytes Relative: 12 % (ref 3–12)
Neutrophils Relative %: 45 % (ref 43–77)
Platelets: 205 10*3/uL (ref 150–400)

## 2013-09-18 NOTE — Progress Notes (Signed)
Labs drawn today for cbc/diff 

## 2013-09-21 ENCOUNTER — Encounter (HOSPITAL_COMMUNITY): Payer: Self-pay

## 2013-09-21 ENCOUNTER — Encounter (HOSPITAL_BASED_OUTPATIENT_CLINIC_OR_DEPARTMENT_OTHER): Payer: 59

## 2013-09-21 VITALS — BP 157/95 | HR 84 | Temp 98.7°F | Resp 16 | Wt 134.9 lb

## 2013-09-21 DIAGNOSIS — D709 Neutropenia, unspecified: Secondary | ICD-10-CM

## 2013-09-21 DIAGNOSIS — F17200 Nicotine dependence, unspecified, uncomplicated: Secondary | ICD-10-CM

## 2013-09-21 DIAGNOSIS — D72820 Lymphocytosis (symptomatic): Secondary | ICD-10-CM

## 2013-09-21 NOTE — Progress Notes (Signed)
[facility] OFFICE PROGRESS NOTE  [name], MD [address], [address] [place] [address]  DIAGNOSIS: Lymphocytosis - Plan: Flow Cytometry  Neutropenia  Chief Complaint  Patient presents with  . Follow-up    CURRENT THERAPY: Watchful expectation.  INTERVAL HISTORY: [name] 57 y.o. male returns for followup of lymphocytosis in the setting of neutropenia. He continues to do well working full-time and continues to drink beer, mainly on the weekends. He appears somewhat tremulous today. He denies any fever, night sweats, easy satiety, lower extremity redness, or need for antibiotics over the past 6 months. He denies any headache, cough, wheezing, epistaxis, melena, hematochezia, hematuria, urinary hesitancy, skin rash, headache, or seizures. MEDICAL HISTORY: Past Medical History  Diagnosis Date  . COPD (chronic obstructive pulmonary disease)   . Hypertension   . Nicotine addiction   . Alcohol abuse   . Asthma   . Neutropenia 04/07/2012    INTERIM HISTORY: has NICOTINE ADDICTION; HYPERTENSION; COPD; MALNUTRITION, MILD; ALCOHOL ABUSE; ALLERGIC RHINITIS CAUSE UNSPECIFIED; Bronchial asthma; Lymphocytosis; Angioedema of lips; Neutropenia; Routine general medical examination at a health care facility; and Depression with anxiety on his problem list.    ALLERGIES:  is allergic to lisinopril.  MEDICATIONS: has a current medication list which includes the following prescription(s): albuterol, amlodipine, aspirin, budesonide-formoterol, citalopram, and triamterene-hydrochlorothiazide.  SURGICAL HISTORY:  Past Surgical History  Procedure Laterality Date  . Multiple tooth extractions  june 2012    FAMILY HISTORY: family history includes Diabetes in his father and mother; Hypertension in his mother and sister.  SOCIAL HISTORY:  reports that he has been smoking Cigarettes.  He has been smoking about 0.50 packs per day. He has never used smokeless tobacco.  He reports that he drinks about 10.8 ounces of alcohol per week. He reports that he does not use illicit drugs.  REVIEW OF SYSTEMS:  Other than that discussed above is noncontributory.  PHYSICAL EXAMINATION: ECOG PERFORMANCE STATUS: 0 - Asymptomatic  Blood pressure 157/95, pulse 84, temperature 98.7 F (37.1 C), temperature source Oral, resp. rate 16, weight 134 lb 14.4 oz (61.19 kg).  GENERAL:alert, no distress and comfortable SKIN: skin color, texture, turgor are normal, no rashes or significant lesions EYES: PERLA; Conjunctiva are pink and non-injected, sclera clear OROPHARYNX:no exudate, no erythema on lips, buccal mucosa, or tongue. NECK: supple, thyroid normal size, non-tender, without nodularity. No masses CHEST: Increased AP diameter with no gynecomastia. LYMPH:  no palpable lymphadenopathy in the cervical, axillary or inguinal LUNGS: clear to auscultation and percussion with normal breathing effort HEART: regular rate & rhythm and no murmurs and no lower extremity edema ABDOMEN:abdomen soft, non-tender and normal bowel sounds MUSCULOSKELETAL:no cyanosis of digits and no clubbing. Range of motion normal.  NEURO: alert & oriented x 3 with fluent speech, no focal motor/sensory deficits. Tremulous.   LABORATORY DATA: Infusion on 09/18/2013  Component Date Value Range Status  . WBC 09/18/2013 6.8  4.0 - 10.5 K/uL Final  . RBC 09/18/2013 4.67  4.22 - 5.81 MIL/uL Final  . Hemoglobin 09/18/2013 14.0  13.0 - 17.0 g/dL Final  . HCT 66/05/3015 40.4  39.0 - 52.0 % Final  . MCV 09/18/2013 86.5  78.0 - 100.0 fL Final  . MCH 09/18/2013 30.0  26.0 - 34.0 pg Final  . MCHC 09/18/2013 34.7  30.0 - 36.0 g/dL Final  . RDW 12/25/3233 12.8  11.5 - 15.5 % Final  . Platelets 09/18/2013 205  150 - 400 K/uL Final  . Neutrophils  Relative % 09/18/2013 45  43 - 77 % Final  . Neutro Abs 09/18/2013 3.0  1.7 - 7.7 K/uL Final  . Lymphocytes Relative 09/18/2013 36  12 - 46 % Final  . Lymphs Abs  09/18/2013 2.4  0.7 - 4.0 K/uL Final  . Monocytes Relative 09/18/2013 12  3 - 12 % Final  . Monocytes Absolute 09/18/2013 0.8  0.1 - 1.0 K/uL Final  . Eosinophils Relative 09/18/2013 7* 0 - 5 % Final  . Eosinophils Absolute 09/18/2013 0.5  0.0 - 0.7 K/uL Final  . Basophils Relative 09/18/2013 1  0 - 1 % Final  . Basophils Absolute 09/18/2013 0.1  0.0 - 0.1 K/uL Final    PATHOLOGY:  Urinalysis No results found for this basename: colorurine, appearanceur, labspec, phurine, glucoseu, hgbur, bilirubinur, ketonesur, proteinur, urobilinogen, nitrite, leukocytesur    RADIOGRAPHIC STUDIES: No results found.  ASSESSMENT: #1. Lymphocytosis, stable with normalization of neutrophil count. #2. Chronic obstructive pulmonary disease, continuing to smoke. #3. Tremulousness probably due to early alcohol withdrawal.   PLAN: #1. Patient refused influenza virus vaccine. #2. Watchful expectation. Patient was told to call should he develop fever or bleeding. #3. Followup in 6 months with CBC, chem profile, LDH, and peripheral blood flow cytometry.   All questions were answered. The patient knows to call the clinic with any problems, questions or concerns. We can certainly see the patient much sooner if necessary.   I spent 30 minutes counseling the patient face to face. The total time spent in the appointment was 25 minutes.    Maurilio Lovely, MD 09/21/2013 9:27 AM

## 2013-09-21 NOTE — Patient Instructions (Addendum)
[facility] Discharge Instructions  RECOMMENDATIONS MADE BY THE CONSULTANT AND ANY TEST RESULTS WILL BE SENT TO YOUR REFERRING PHYSICIAN.  EXAM FINDINGS BY THE PHYSICIAN TODAY AND SIGNS OR SYMPTOMS TO REPORT TO CLINIC OR PRIMARY PHYSICIAN: Exam and findings as discussed by Dr. Zigmund Daniel.  Report fevers, night sweats or other problems.  MEDICATIONS PRESCRIBED:  none  INSTRUCTIONS/FOLLOW-UP: Lab work in 6 months then to be seen in follow-up  Thank you for choosing [facility] to provide your oncology and hematology care.  To afford each patient quality time with our providers, please arrive at least 15 minutes before your scheduled appointment time.  With your help, our goal is to use those 15 minutes to complete the necessary work-up to ensure our physicians have the information they need to help with your evaluation and healthcare recommendations.    Effective January 1st, 2014, we ask that you re-schedule your appointment with our physicians should you arrive 10 or more minutes late for your appointment.  We strive to give you quality time with our providers, and arriving late affects you and other patients whose appointments are after yours.    Again, thank you for choosing [facility].  Our hope is that these requests will decrease the amount of time that you wait before being seen by our physicians.       _____________________________________________________________  Should you have questions after your visit to [facility], please contact our office at [phone] between the hours of 8:30 a.m. and 5:00 p.m.  Voicemails left after 4:30 p.m. will not be returned until the following business day.  For prescription refill requests, have your pharmacy contact our office with your prescription refill request.

## 2013-09-28 ENCOUNTER — Other Ambulatory Visit: Payer: Self-pay | Admitting: Family Medicine

## 2013-10-15 ENCOUNTER — Encounter: Payer: Self-pay | Admitting: Family Medicine

## 2013-10-15 ENCOUNTER — Ambulatory Visit (INDEPENDENT_AMBULATORY_CARE_PROVIDER_SITE_OTHER): Payer: 59 | Admitting: Family Medicine

## 2013-10-15 VITALS — BP 130/84 | HR 91 | Resp 16 | Ht 70.0 in | Wt 137.4 lb

## 2013-10-15 DIAGNOSIS — F1010 Alcohol abuse, uncomplicated: Secondary | ICD-10-CM

## 2013-10-15 DIAGNOSIS — F418 Other specified anxiety disorders: Secondary | ICD-10-CM

## 2013-10-15 DIAGNOSIS — Z13220 Encounter for screening for lipoid disorders: Secondary | ICD-10-CM

## 2013-10-15 DIAGNOSIS — Z125 Encounter for screening for malignant neoplasm of prostate: Secondary | ICD-10-CM

## 2013-10-15 DIAGNOSIS — F341 Dysthymic disorder: Secondary | ICD-10-CM

## 2013-10-15 DIAGNOSIS — J449 Chronic obstructive pulmonary disease, unspecified: Secondary | ICD-10-CM

## 2013-10-15 DIAGNOSIS — F17200 Nicotine dependence, unspecified, uncomplicated: Secondary | ICD-10-CM

## 2013-10-15 DIAGNOSIS — I10 Essential (primary) hypertension: Secondary | ICD-10-CM

## 2013-10-15 DIAGNOSIS — Z23 Encounter for immunization: Secondary | ICD-10-CM

## 2013-10-15 DIAGNOSIS — Z1321 Encounter for screening for nutritional disorder: Secondary | ICD-10-CM

## 2013-10-15 DIAGNOSIS — J45909 Unspecified asthma, uncomplicated: Secondary | ICD-10-CM

## 2013-10-15 DIAGNOSIS — Z130 Encounter for screening for diseases of the blood and blood-forming organs and certain disorders involving the immune mechanism: Secondary | ICD-10-CM

## 2013-10-15 DIAGNOSIS — J4489 Other specified chronic obstructive pulmonary disease: Secondary | ICD-10-CM

## 2013-10-15 DIAGNOSIS — E441 Mild protein-calorie malnutrition: Secondary | ICD-10-CM

## 2013-10-15 MED ORDER — AMLODIPINE BESYLATE 10 MG PO TABS: ORAL_TABLET | ORAL | Status: DC

## 2013-10-15 MED ORDER — CITALOPRAM HYDROBROMIDE 10 MG PO TABS: ORAL_TABLET | ORAL | Status: DC

## 2013-10-15 MED ORDER — ALBUTEROL SULFATE HFA 108 (90 BASE) MCG/ACT IN AERS: 2.0000 | INHALATION_SPRAY | Freq: Four times a day (QID) | RESPIRATORY_TRACT | Status: DC | PRN

## 2013-10-15 NOTE — Patient Instructions (Addendum)
F/u with rectal in 4 month, call if you need me before  Flu vaccine today  Fasting lipid, cmp, PSA , TSH and vit D  In March  5 to 7 days BEFORE visit  You are referred for a lung function test  Please continue to work on smoking cessation, you need to quit  To improve your breathing and reduce your risk of heart disease and smoke  CXR is already ordered, please get  As soon as possible

## 2013-10-15 NOTE — Progress Notes (Signed)
  Subjective:    Patient ID: [name], male    DOB: Feb 08, 1956, 57 y.o.   MRN: [id number]  HPI The PT is here for follow up and re-evaluation of chronic medical conditions, medication management and review of any available recent lab and radiology data.  Preventive health is updated, specifically  Cancer screening and Immunization.   Questions or concerns regarding consultations or procedures which the PT has had in the interim are  Addressed.Has had his hematology visit for the year and this went well, no concerns, will continue to be followed annually The PT denies any adverse reactions to current medications since the last visit.  C/o worsening with his breathing, attributes a lot of this to his work environment, I also tried to help him to understand that continued smoking was extremely damaging and toi encourage him to quit, still not ready to commit.  Reports less tobacco and alcohol use and increased food intake including boost supplements, happy about weight gain There are no specific complaints       Review of Systems See HPI Denies recent fever or chills. Denies sinus pressure, nasal congestion, ear pain or sore throat. Denies chest congestion, productive cough , worsening shortness of breath with intermittent wheeze Denies chest pains, palpitations and leg swelling Denies abdominal pain, nausea, vomiting,diarrhea or constipation.   Denies dysuria, frequency, hesitancy or incontinence. Denies joint pain, swelling and limitation in mobility. Denies headaches, seizures, numbness, or tingling. Denies depression, anxiety or insomnia. Denies skin break down or rash.        Objective:   Physical Exam  Patient alert and oriented and in no cardiopulmonary distress.  HEENT: No facial asymmetry, EOMI, no sinus tenderness,  oropharynx pink and moist.  Neck supple no adenopathy.  Chest: Clear to auscultation bilaterally.Decreased air entry throughout  CVS: S1, S2 no  murmurs, no S3.  ABD: Soft non tender. Bowel sounds normal.  Ext: No edema  MS: Adequate ROM spine, shoulders, hips and knees.  Skin: Intact, no ulcerations or rash noted.  Psych: Good eye contact, normal affect. Memory intact not anxious or depressed appearing.  CNS: CN 2-12 intact, power, tone and sensation normal throughout.       Assessment & Plan:

## 2013-10-16 ENCOUNTER — Telehealth: Payer: Self-pay | Admitting: Family Medicine

## 2013-10-16 NOTE — Telephone Encounter (Signed)
LEFT MESSAGE WITH MOTHER FOR PATIENTS APPOINTMENT FOR THE PFT IS 11.10.2014 BE THERE AT 11:45 AT [facility] IF HE HAS ANY QUESTIONS PLEASE CALL BACK

## 2013-10-18 NOTE — Assessment & Plan Note (Signed)
Improved food intake with weight gain

## 2013-10-18 NOTE — Assessment & Plan Note (Signed)
Trying to cut back, no quit date  Set. Patient counseled for approximately 5 minutes regarding the health risks of ongoing nicotine use, specifically all types of cancer, heart disease, stroke and respiratory failure. The options available for help with cessation ,the behavioral changes to assist the process, and the option to either gradully reduce usage  Or abruptly stop.is also discussed. Pt is also encouraged to set specific goals in number of cigarettes used daily, as well as to set a quit date.

## 2013-10-18 NOTE — Assessment & Plan Note (Signed)
Controlled, no change in medication DASH diet and commitment to daily physical activity for a minimum of 30 minutes discussed and encouraged, as a part of hypertension management. The importance of attaining a healthy weight is also discussed.  

## 2013-10-18 NOTE — Assessment & Plan Note (Signed)
Currently stable, no recent asthma flares

## 2013-10-18 NOTE — Assessment & Plan Note (Signed)
Improved on SSRI continue same

## 2013-10-18 NOTE — Assessment & Plan Note (Signed)
Worsening due to oongoing nicotine use Patient counseled for approximately 5 minutes regarding the health risks of ongoing nicotine use, specifically all types of cancer, heart disease, stroke and respiratory failure. The options available for help with cessation ,the behavioral changes to assist the process, and the option to either gradully reduce usage  Or abruptly stop.is also discussed. Pt is also encouraged to set specific goals in number of cigarettes used daily, as well as to set a quit date. Also reports working in an environment where he is exposed to an excessive amount of fumes/smoke which aggravates his breathing, counseled him to seek alternate work

## 2013-10-18 NOTE — Assessment & Plan Note (Signed)
Marked reduction in alcohol intake per his reporting

## 2013-10-26 ENCOUNTER — Ambulatory Visit (HOSPITAL_COMMUNITY)
Admission: RE | Admit: 2013-10-26 | Discharge: 2013-10-26 | Disposition: A | Discharge status: Home / Self Care | Payer: 59 | Source: Ambulatory Visit | Attending: Family Medicine | Admitting: Family Medicine

## 2013-10-26 DIAGNOSIS — J449 Chronic obstructive pulmonary disease, unspecified: Secondary | ICD-10-CM | POA: Insufficient documentation

## 2013-10-26 DIAGNOSIS — J4489 Other specified chronic obstructive pulmonary disease: Secondary | ICD-10-CM | POA: Insufficient documentation

## 2013-10-26 MED ORDER — ALBUTEROL SULFATE (5 MG/ML) 0.5% IN NEBU
2.5000 mg | INHALATION_SOLUTION | Freq: Once | RESPIRATORY_TRACT | Status: AC
  Administered 2013-10-26: 2.5 mg via RESPIRATORY_TRACT

## 2013-10-27 NOTE — Procedures (Signed)
NAME:  Antrim, Oval              ACCOUNT NO.:  [id number]  MEDICAL RECORD NO.:  [id number]  LOCATION:  RESP                          FACILITY:  [facility]  PHYSICIAN:  Alferd Obryant L. Juanetta Gosling, M.D.DATE OF BIRTH:  1956-05-07  DATE OF PROCEDURE:  10/26/2013 DATE OF DISCHARGE:  10/26/2013                           PULMONARY FUNCTION TEST   REASON FOR PULMONARY FUNCTION TESTING:  COPD.  1. Spirometry shows a moderate ventilatory defect with evidence of     airflow obstruction. 2. Lung volumes show mild reduction of total lung capacity and air     trapping. 3. DLCO is severely reduced. 4. Airway resistance is elevated confirming the presence of airflow     obstruction. 5. There is no significant bronchodilator improvement. 6. This study is consistent with COPD.     Minnie Shi L. Juanetta Gosling, M.D.     [facility]/MEDQ  D:  10/26/2013  T:  10/27/2013  Job:  [id number]

## 2013-11-11 LAB — PULMONARY FUNCTION TEST

## 2013-11-13 ENCOUNTER — Other Ambulatory Visit: Payer: Self-pay | Admitting: Family Medicine

## 2013-11-13 DIAGNOSIS — J449 Chronic obstructive pulmonary disease, unspecified: Secondary | ICD-10-CM

## 2013-12-28 ENCOUNTER — Other Ambulatory Visit: Payer: Self-pay | Admitting: Family Medicine

## 2014-02-11 ENCOUNTER — Other Ambulatory Visit: Payer: Self-pay | Admitting: Family Medicine

## 2014-02-17 ENCOUNTER — Ambulatory Visit (INDEPENDENT_AMBULATORY_CARE_PROVIDER_SITE_OTHER): Payer: 59 | Admitting: Family Medicine

## 2014-02-17 ENCOUNTER — Encounter: Payer: Self-pay | Admitting: Family Medicine

## 2014-02-17 ENCOUNTER — Encounter (INDEPENDENT_AMBULATORY_CARE_PROVIDER_SITE_OTHER): Payer: Self-pay

## 2014-02-17 VITALS — BP 130/82 | HR 101 | Resp 16 | Ht 70.0 in | Wt 136.1 lb

## 2014-02-17 DIAGNOSIS — J45909 Unspecified asthma, uncomplicated: Secondary | ICD-10-CM

## 2014-02-17 DIAGNOSIS — R9389 Abnormal findings on diagnostic imaging of other specified body structures: Secondary | ICD-10-CM

## 2014-02-17 DIAGNOSIS — I10 Essential (primary) hypertension: Secondary | ICD-10-CM

## 2014-02-17 DIAGNOSIS — R9431 Abnormal electrocardiogram [ECG] [EKG]: Secondary | ICD-10-CM

## 2014-02-17 DIAGNOSIS — D72820 Lymphocytosis (symptomatic): Secondary | ICD-10-CM

## 2014-02-17 DIAGNOSIS — F418 Other specified anxiety disorders: Secondary | ICD-10-CM

## 2014-02-17 DIAGNOSIS — F341 Dysthymic disorder: Secondary | ICD-10-CM

## 2014-02-17 DIAGNOSIS — Z1211 Encounter for screening for malignant neoplasm of colon: Secondary | ICD-10-CM

## 2014-02-17 DIAGNOSIS — J449 Chronic obstructive pulmonary disease, unspecified: Secondary | ICD-10-CM

## 2014-02-17 DIAGNOSIS — F17200 Nicotine dependence, unspecified, uncomplicated: Secondary | ICD-10-CM

## 2014-02-17 LAB — POC HEMOCCULT BLD/STL (OFFICE/1-CARD/DIAGNOSTIC): FECAL OCCULT BLD: NEGATIVE

## 2014-02-17 MED ORDER — ALBUTEROL SULFATE HFA 108 (90 BASE) MCG/ACT IN AERS: 2.0000 | INHALATION_SPRAY | Freq: Four times a day (QID) | RESPIRATORY_TRACT | Status: DC | PRN

## 2014-02-17 MED ORDER — TRIAMTERENE-HCTZ 75-50 MG PO TABS: ORAL_TABLET | ORAL | Status: DC

## 2014-02-17 NOTE — Patient Instructions (Addendum)
F/u in 4.5 month, call if you need me before  Rectal and EKG today.  You will be referred to a  Heart specialist.  You need to QUIT smoking  Blood pressure is excellent  PLEASE get labs next week they are past due important to get them

## 2014-02-19 ENCOUNTER — Ambulatory Visit: Payer: 59 | Admitting: Family Medicine

## 2014-02-27 LAB — COMPREHENSIVE METABOLIC PANEL
ALT: 19 U/L (ref 0–53)
AST: 24 U/L (ref 0–37)
Albumin: 4.2 g/dL (ref 3.5–5.2)
Alkaline Phosphatase: 64 U/L (ref 39–117)
BUN: 18 mg/dL (ref 6–23)
CO2: 28 mEq/L (ref 19–32)
Calcium: 9.4 mg/dL (ref 8.4–10.5)
Chloride: 98 mEq/L (ref 96–112)
Creat: 1.21 mg/dL (ref 0.50–1.35)
Glucose, Bld: 86 mg/dL (ref 70–99)
Potassium: 3.8 mEq/L (ref 3.5–5.3)
Sodium: 137 mEq/L (ref 135–145)
Total Bilirubin: 0.3 mg/dL (ref 0.2–1.2)
Total Protein: 7.3 g/dL (ref 6.0–8.3)

## 2014-02-27 LAB — LIPID PANEL
Cholesterol: 175 mg/dL (ref 0–200)
HDL: 99 mg/dL (ref >39)
LDL Cholesterol: 55 mg/dL (ref 0–99)
Total CHOL/HDL Ratio: 1.8 Ratio
Triglycerides: 104 mg/dL (ref <150)
VLDL: 21 mg/dL (ref 0–40)

## 2014-02-27 LAB — TSH: TSH: 2.052 u[IU]/mL (ref 0.350–4.500)

## 2014-02-28 LAB — PSA: PSA: 0.98 ng/mL (ref <=4.00)

## 2014-03-01 LAB — VITAMIN D 25 HYDROXY (VIT D DEFICIENCY, FRACTURES): Vit D, 25-Hydroxy: 19 ng/mL — ABNORMAL LOW (ref 30–89)

## 2014-03-05 MED ORDER — ERGOCALCIFEROL 1.25 MG (50000 UT) PO CAPS: 50000.0000 [IU] | ORAL_CAPSULE | ORAL | Status: DC

## 2014-03-05 NOTE — Addendum Note (Signed)
Addended by: Kandis FantasiaSLADE, COURTNEY B on: 03/05/2014 01:56 PM   Modules accepted: Orders

## 2014-03-05 NOTE — Progress Notes (Signed)
Patient aware the Vit D3 sent to pharmacy.

## 2014-03-08 ENCOUNTER — Ambulatory Visit: Payer: 59 | Admitting: Cardiology

## 2014-03-10 ENCOUNTER — Encounter: Payer: Self-pay | Admitting: Family Medicine

## 2014-03-11 ENCOUNTER — Encounter: Payer: Self-pay | Admitting: Cardiology

## 2014-03-11 ENCOUNTER — Ambulatory Visit (INDEPENDENT_AMBULATORY_CARE_PROVIDER_SITE_OTHER): Payer: 59 | Admitting: Cardiology

## 2014-03-11 VITALS — BP 107/69 | HR 84 | Ht 72.0 in | Wt 136.0 lb

## 2014-03-11 DIAGNOSIS — I10 Essential (primary) hypertension: Secondary | ICD-10-CM

## 2014-03-11 DIAGNOSIS — F17200 Nicotine dependence, unspecified, uncomplicated: Secondary | ICD-10-CM

## 2014-03-11 DIAGNOSIS — I2510 Atherosclerotic heart disease of native coronary artery without angina pectoris: Secondary | ICD-10-CM | POA: Insufficient documentation

## 2014-03-11 NOTE — Assessment & Plan Note (Signed)
Blood pressure is normal today. Continue current regimen and keep followup with Dr. Lodema HongSimpson.

## 2014-03-11 NOTE — Assessment & Plan Note (Signed)
Diagnosis based on chest CT scan from 2012. He is asymptomatic at reasonably high workload based on job description. ECG is abnormal consistent with LVH with repolarization abnormalities. He has a fairly long-standing history of hypertension, reports compliance with medications, is also on aspirin daily. Recent lipid panel shows LDL of 55. At this point would recommend observation, no further cardiac testing as yet. He needs to focus on risk factor modification, smoking cessation in particular. Not entirely clear that adding a statin would do much to reduce his risk further particularly with low LDL at baseline. We will plan to see him back over the next year.

## 2014-03-11 NOTE — Patient Instructions (Signed)
Your physician wants you to follow-up in: 1 year You will receive a reminder letter in the mail two months in advance. If you don't receive a letter, please call our office to schedule the follow-up appointment.    Your physician recommends that you continue on your current medications as directed. Please refer to the Current Medication list given to you today.     Thank you for choosing [facility] !  

## 2014-03-11 NOTE — Progress Notes (Signed)
Clinical Summary Mr. Enderle is a 58 y.o.male referred for cardiology consultation by Dr. Lodema Hong. He is referred for general cardiovascular assessment. He denies any angina symptoms, no shortness of breath over NYHA class I, no palpitations or syncope. His job is physically active, he states that he lifts 50 pound bags regularly, multiple times during his shift, and has no limitations with this.  Chest CT scan from December 2012 demonstrated mild centrilobular emphysema without acute process or nodules. There was coronary atherosclerosis demonstrated within the proximal to mid LAD, normal heart size, no pericardial effusion. He takes aspirin daily, also reports compliance with antihypertensives. ECG today shows sinus rhythm with borderline increased voltage and probable repolarization abnormalities in the inferolateral leads.  Recent ECG shows sinus rhythm with left ventricular hypertrophy, possible biatrial enlargement, nonspecific ST-T changes. Lab work from March of this year showed cholesterol 175, triglycerides 104, HDL 99, LDL 55, potassium 3.8, BUN 18, creatinine 1.2, normal LFTs, TSH 2.0. He has not been on statin therapy.  He does continue to smoke cigarettes, we discussed cessation techniques. He states that he has never had any long periods of time that he has quit. He has been able to cut back.  We reviewed the results of his chest CT from a few years ago, discussed implications of a finding of coronary artery calcifications. Main issue at this point is risk factor modification, smoking cessation, particularly as he is asymptomatic at fairly high workload based on job description.   Allergies  Allergen Reactions  . Lisinopril     Angioedema while on this medication    Current Outpatient Prescriptions  Medication Sig Dispense Refill  . albuterol (PROVENTIL HFA;VENTOLIN HFA) 108 (90 BASE) MCG/ACT inhaler Inhale 2 puffs into the lungs every 6 (six) hours as needed for wheezing.  1  Inhaler  4  . amLODipine (NORVASC) 10 MG tablet TAKE ONE TABLET BY MOUTH DAILY.  30 tablet  5  . aspirin 81 MG EC tablet Take 162 mg by mouth daily.       . citalopram (CELEXA) 10 MG tablet TAKE ONE TABLET DAILY.  30 tablet  5  . ergocalciferol (VITAMIN D2) 50000 UNITS capsule Take 1 capsule (50,000 Units total) by mouth once a week.  4 capsule  5  . SYMBICORT 160-4.5 MCG/ACT inhaler INHALE 2 PUFFS BY MOUTH TWICE DAILY.  10.2 g  2  . triamterene-hydrochlorothiazide (MAXZIDE) 75-50 MG per tablet TAKE ONE TABLET BY MOUTH DAILY.  30 tablet  4   No current facility-administered medications for this visit.    Past Medical History  Diagnosis Date  . COPD (chronic obstructive pulmonary disease)   . Essential hypertension, benign   . Nicotine addiction   . Alcohol abuse   . Asthma   . History of neutropenia     Also lymphocytosis - followed by Dr. Zigmund Daniel  . Coronary atherosclerosis     Based on chest CT 2012    Past Surgical History  Procedure Laterality Date  . Multiple tooth extractions  June 2012    Family History  Problem Relation Age of Onset  . Diabetes Mother   . Hypertension Mother   . Diabetes Father   . Hypertension Sister     Social History [name] reports that he has been smoking Cigarettes.  He has been smoking about 0.50 packs per day. He has never used smokeless tobacco. Mr. Hamon reports that he drinks about 10.8 ounces of alcohol per week.  Review of Systems Negative  except as outlined above.  Physical Examination Filed Vitals:   03/11/14 0828  BP: 107/69  Pulse: 84   Filed Weights   03/11/14 0828  Weight: 136 lb (61.689 kg)   Thin male, appears comfortable at rest. HEENT: Conjunctiva and lids normal, oropharynx clear. Neck: Supple, no elevated JVP or carotid bruits, no thyromegaly. Lungs: Decreased breath sounds with scattered rhonchi, nonlabored breathing at rest. Cardiac: Regular rate and rhythm, S4, no significant systolic murmur, no  pericardial rub. Abdomen: Soft, nontender, bowel sounds present, no guarding or rebound. Extremities: No pitting edema, distal pulses 2+. Skin: Warm and dry. Musculoskeletal: No kyphosis. Neuropsychiatric: Alert and oriented x3, affect grossly appropriate.   Problem List and Plan   Coronary atherosclerosis Diagnosis based on chest CT scan from 2012. He is asymptomatic at reasonably high workload based on job description. ECG is abnormal consistent with LVH with repolarization abnormalities. He has a fairly long-standing history of hypertension, reports compliance with medications, is also on aspirin daily. Recent lipid panel shows LDL of 55. At this point would recommend observation, no further cardiac testing as yet. He needs to focus on risk factor modification, smoking cessation in particular. Not entirely clear that adding a statin would do much to reduce his risk further particularly with low LDL at baseline. We will plan to see him back over the next year.  NICOTINE ADDICTION Smoking cessation discussed.  HYPERTENSION Blood pressure is normal today. Continue current regimen and keep followup with Dr. Lodema HongSimpson.    Jonelle SidleSamuel G. Ryelan Kazee, M.D., F.A.C.C.

## 2014-03-11 NOTE — Assessment & Plan Note (Signed)
Smoking cessation discussed 

## 2014-03-15 ENCOUNTER — Encounter (HOSPITAL_COMMUNITY): Payer: 59 | Attending: Hematology and Oncology

## 2014-03-15 ENCOUNTER — Other Ambulatory Visit (HOSPITAL_COMMUNITY)
Admission: RE | Admit: 2014-03-15 | Discharge: 2014-03-15 | Disposition: A | Discharge status: Home / Self Care | Payer: 59 | Source: Ambulatory Visit | Attending: Hematology and Oncology | Admitting: Hematology and Oncology

## 2014-03-15 DIAGNOSIS — D709 Neutropenia, unspecified: Secondary | ICD-10-CM

## 2014-03-15 DIAGNOSIS — D72820 Lymphocytosis (symptomatic): Secondary | ICD-10-CM

## 2014-03-15 LAB — LACTATE DEHYDROGENASE: LDH: 168 U/L (ref 94–250)

## 2014-03-15 LAB — COMPREHENSIVE METABOLIC PANEL
ALK PHOS: 85 U/L (ref 39–117)
ALT: 24 U/L (ref 0–53)
AST: 30 U/L (ref 0–37)
Albumin: 3.8 g/dL (ref 3.5–5.2)
BUN: 10 mg/dL (ref 6–23)
CHLORIDE: 96 meq/L (ref 96–112)
CO2: 31 mEq/L (ref 19–32)
Calcium: 9 mg/dL (ref 8.4–10.5)
Creatinine, Ser: 1.32 mg/dL (ref 0.50–1.35)
GFR, EST AFRICAN AMERICAN: 69 mL/min — AB (ref >90)
GFR, EST NON AFRICAN AMERICAN: 59 mL/min — AB (ref >90)
GLUCOSE: 78 mg/dL (ref 70–99)
POTASSIUM: 3.5 meq/L — AB (ref 3.7–5.3)
Sodium: 140 mEq/L (ref 137–147)
Total Bilirubin: 0.3 mg/dL (ref 0.3–1.2)
Total Protein: 7.6 g/dL (ref 6.0–8.3)

## 2014-03-15 LAB — CBC WITH DIFFERENTIAL/PLATELET
BASOS ABS: 0 10*3/uL (ref 0.0–0.1)
Basophils Relative: 1 % (ref 0–1)
Eosinophils Absolute: 0.2 10*3/uL (ref 0.0–0.7)
Eosinophils Relative: 4 % (ref 0–5)
HCT: 39.2 % (ref 39.0–52.0)
Hemoglobin: 13.3 g/dL (ref 13.0–17.0)
LYMPHS ABS: 2.1 10*3/uL (ref 0.7–4.0)
LYMPHS PCT: 56 % — AB (ref 12–46)
MCH: 29.4 pg (ref 26.0–34.0)
MCHC: 33.9 g/dL (ref 30.0–36.0)
MCV: 86.7 fL (ref 78.0–100.0)
Monocytes Absolute: 0.3 10*3/uL (ref 0.1–1.0)
Monocytes Relative: 9 % (ref 3–12)
NEUTROS ABS: 1.2 10*3/uL — AB (ref 1.7–7.7)
Neutrophils Relative %: 30 % — ABNORMAL LOW (ref 43–77)
PLATELETS: 208 10*3/uL (ref 150–400)
RBC: 4.52 MIL/uL (ref 4.22–5.81)
RDW: 12.3 % (ref 11.5–15.5)
WBC: 3.9 10*3/uL — AB (ref 4.0–10.5)

## 2014-03-15 NOTE — Progress Notes (Signed)
Noel GeroldErnest D Gaster presented for labwork. Labs per MD order drawn via Peripheral Line 23 gauge needle inserted in rt ac  Good blood return present. Procedure without incident.  Needle removed intact. Patient tolerated procedure well.

## 2014-03-22 ENCOUNTER — Encounter (HOSPITAL_COMMUNITY): Payer: 59 | Attending: Hematology and Oncology

## 2014-03-22 ENCOUNTER — Encounter (HOSPITAL_COMMUNITY): Payer: Self-pay

## 2014-03-22 VITALS — BP 158/92 | HR 90 | Temp 98.5°F | Resp 16 | Wt 141.0 lb

## 2014-03-22 DIAGNOSIS — J449 Chronic obstructive pulmonary disease, unspecified: Secondary | ICD-10-CM

## 2014-03-22 DIAGNOSIS — D72820 Lymphocytosis (symptomatic): Secondary | ICD-10-CM

## 2014-03-22 DIAGNOSIS — D709 Neutropenia, unspecified: Secondary | ICD-10-CM | POA: Insufficient documentation

## 2014-03-22 DIAGNOSIS — I10 Essential (primary) hypertension: Secondary | ICD-10-CM

## 2014-03-22 NOTE — Patient Instructions (Signed)
[facility] Discharge Instructions  RECOMMENDATIONS MADE BY THE CONSULTANT AND ANY TEST RESULTS WILL BE SENT TO YOUR REFERRING PHYSICIAN.  EXAM FINDINGS BY THE PHYSICIAN TODAY AND SIGNS OR SYMPTOMS TO REPORT TO CLINIC OR PRIMARY PHYSICIAN: Exam and findings as discussed by Dr.Gregory Formanek.  MEDICATIONS PRESCRIBED:  Nothing Won Kreuzer. Continue all medications as prescribed by your other physicians.  INSTRUCTIONS/FOLLOW-UP: Return to clinic in 6 months first for lab work then for follow up with the MD. Report any issues/concerns to clinic as needed prior to appointments.  Thank you for choosing [facility] to provide your oncology and hematology care.  To afford each patient quality time with our providers, please arrive at least 15 minutes before your scheduled appointment time.  With your help, our goal is to use those 15 minutes to complete the necessary work-up to ensure our physicians have the information they need to help with your evaluation and healthcare recommendations.    Effective January 1st, 2014, we ask that you re-schedule your appointment with our physicians should you arrive 10 or more minutes late for your appointment.  We strive to give you quality time with our providers, and arriving late affects you and other patients whose appointments are after yours.    Again, thank you for choosing [facility].  Our hope is that these requests will decrease the amount of time that you wait before being seen by our physicians.       _____________________________________________________________  Should you have questions after your visit to [facility], please contact our office at [phone] between the hours of 8:30 a.m. and 5:00 p.m.  Voicemails left after 4:30 p.m. will not be returned until the following business day.  For prescription refill requests, have your pharmacy contact our office with your prescription refill  request.

## 2014-03-22 NOTE — Progress Notes (Signed)
[name]  OFFICE PROGRESS NOTE  Nicholas Nakayama, MD [address] [place] [address]  DIAGNOSIS: Lymphocytosis - Plan: Flow Cytometry  Neutropenia - Plan: Flow Cytometry  Chief Complaint  Patient presents with  . Neutropenia with absolute lymphocytosis    CURRENT THERAPY: Watchful expectation and surveillance.  INTERVAL HISTORY: [name] 58 y.o. male returns for followup of neutropenia with absolute lymphocytosis having received no treatment in the past. Flow cytometry performed on 03/15/2014 showed no evidence of a lymphoproliferative disorder. He continues to work full-time. Appetite is good with no sore throat, allergy symptoms, earache, cough, wheezing, nausea, vomiting, diarrhea, constipation, urinary hesitancy, hematuria, melena, hematochezia, diarrhea, constipation, lower extremity swelling or redness, chest pain, PND, orthopnea, palpitations, lymphadenopathy, or easy satiety.  MEDICAL HISTORY: Past Medical History  Diagnosis Date  . COPD (chronic obstructive pulmonary disease)   . Essential hypertension, benign   . Nicotine addiction   . Alcohol abuse   . Asthma   . History of neutropenia     Also lymphocytosis - followed by Dr. Barnet [name]  . Coronary atherosclerosis     Based on chest CT 2012    INTERIM HISTORY: has NICOTINE ADDICTION; HYPERTENSION; COPD; MALNUTRITION, MILD; ALCOHOL ABUSE; ALLERGIC RHINITIS CAUSE UNSPECIFIED; Asthma with COPD; Lymphocytosis; Angioedema of lips; Neutropenia; Routine general medical examination at a health care facility; Depression with anxiety; Nonspecific abnormal electrocardiogram (ECG) (EKG); and Coronary atherosclerosis on his problem list.    ALLERGIES:  is allergic to lisinopril.  MEDICATIONS: has a current medication list which includes the following prescription(s): albuterol, amlodipine, aspirin, citalopram, ergocalciferol, symbicort, and  triamterene-hydrochlorothiazide.  SURGICAL HISTORY:  Past Surgical History  Procedure Laterality Date  . Multiple tooth extractions  June 2012    FAMILY HISTORY: family history includes Diabetes in his father and mother; Hypertension in his mother and sister.  SOCIAL HISTORY:  reports that he has been smoking Cigarettes.  He has been smoking about 0.50 packs per day. He has never used smokeless tobacco. He reports that he drinks about 10.8 ounces of alcohol per week. He reports that he does not use illicit drugs.  REVIEW OF SYSTEMS:  Other than that discussed above is noncontributory.  PHYSICAL EXAMINATION: ECOG PERFORMANCE STATUS: 0 - Asymptomatic  Blood pressure 158/92, pulse 90, temperature 98.5 F (36.9 C), temperature source Oral, resp. rate 16, weight 141 lb (63.957 kg).  GENERAL:alert, no distress and comfortable SKIN: skin color, texture, turgor are normal, no rashes or significant lesions EYES: PERLA; Conjunctiva are pink and non-injected, sclera clear SINUSES: No redness or tenderness over maxillary or ethmoid sinuses OROPHARYNX:no exudate, no erythema on lips, buccal mucosa, or tongue. NECK: supple, thyroid normal size, non-tender, without nodularity. No masses CHEST: Increased AP diameter with no breast masses. LYMPH:  no palpable lymphadenopathy in the cervical, axillary or inguinal LUNGS: clear to auscultation and percussion with normal breathing effort HEART: regular rate & rhythm and no murmurs. ABDOMEN:abdomen soft, non-tender and normal bowel sounds MUSCULOSKELETAL:no cyanosis of digits and no clubbing. Range of motion normal.  NEURO: alert & oriented x 3 with fluent speech, no focal motor/sensory deficits   LABORATORY DATA: Infusion on 03/15/2014  Component Date Value Ref Range Status  . WBC 03/15/2014 3.9* 4.0 - 10.5 K/uL Final  . RBC 03/15/2014 4.52  4.22 - 5.81 MIL/uL Final  . Hemoglobin 03/15/2014 13.3  13.0 - 17.0 g/dL Final  . HCT 03/15/2014 39.2   39.0 - 52.0 %  Final  . MCV 03/15/2014 86.7  78.0 - 100.0 fL Final  . MCH 03/15/2014 29.4  26.0 - 34.0 pg Final  . MCHC 03/15/2014 33.9  30.0 - 36.0 g/dL Final  . RDW 03/15/2014 12.3  11.5 - 15.5 % Final  . Platelets 03/15/2014 208  150 - 400 K/uL Final  . Neutrophils Relative % 03/15/2014 30* 43 - 77 % Final  . Neutro Abs 03/15/2014 1.2* 1.7 - 7.7 K/uL Final  . Lymphocytes Relative 03/15/2014 56* 12 - 46 % Final  . Lymphs Abs 03/15/2014 2.1  0.7 - 4.0 K/uL Final  . Monocytes Relative 03/15/2014 9  3 - 12 % Final  . Monocytes Absolute 03/15/2014 0.3  0.1 - 1.0 K/uL Final  . Eosinophils Relative 03/15/2014 4  0 - 5 % Final  . Eosinophils Absolute 03/15/2014 0.2  0.0 - 0.7 K/uL Final  . Basophils Relative 03/15/2014 1  0 - 1 % Final  . Basophils Absolute 03/15/2014 0.0  0.0 - 0.1 K/uL Final  . Sodium 03/15/2014 140  137 - 147 mEq/L Final  . Potassium 03/15/2014 3.5* 3.7 - 5.3 mEq/L Final  . Chloride 03/15/2014 96  96 - 112 mEq/L Final  . CO2 03/15/2014 31  19 - 32 mEq/L Final  . Glucose, Bld 03/15/2014 78  70 - 99 mg/dL Final  . BUN 03/15/2014 10  6 - 23 mg/dL Final  . Creatinine, Ser 03/15/2014 1.32  0.50 - 1.35 mg/dL Final  . Calcium 03/15/2014 9.0  8.4 - 10.5 mg/dL Final  . Total Protein 03/15/2014 7.6  6.0 - 8.3 g/dL Final  . Albumin 03/15/2014 3.8  3.5 - 5.2 g/dL Final  . AST 03/15/2014 30  0 - 37 U/L Final  . ALT 03/15/2014 24  0 - 53 U/L Final  . Alkaline Phosphatase 03/15/2014 85  39 - 117 U/L Final  . Total Bilirubin 03/15/2014 0.3  0.3 - 1.2 mg/dL Final  . GFR calc non Af Amer 03/15/2014 59* >90 mL/min Final  . GFR calc Af Amer 03/15/2014 69* >90 mL/min Final   Comment: (NOTE)                          The eGFR has been calculated using the CKD EPI equation.                          This calculation has not been validated in all clinical situations.                          eGFR's persistently <90 mL/min signify possible Chronic Kidney                          Disease.  [name]  LDH 03/15/2014 168  94 - 250 U/L Final    PATHOLOGY: FINAL for [name] [phone]) Patient: [name] Collected: 03/15/2014 Client: [name] Accession: [id number] Received: 03/15/2014 Nicholas Gobble, MD DOB: 20-Jan-1956 Age: 17 Gender: M Reported: 03/15/2014 [address] Patient Ph: [phone] MRN #: [id number] Lin[place] [id number] Visit #: [id number] Chart #: Phone: Fax: CC: FLOW CYTOMETRY REPORT INTERPRETATION Interpretation Peripheral Blood Flow Cytometry - NO MONOCLONAL B-CELL OR PHENOTYPICALLY ABERRANT T-CELL POPULATION. Vicente Males MD Pathologist, Electronic Signature (Case signed 03/15/2014) GROSS AND MICROSCOPIC INFORMATION Source Peripheral Blood Flow Cytometry Microscopic Gated population: Flow cytometric immunophenotyping is  performed using antibiodies to the antigens listed in the table below. Electronic gates are placed around a cell cluster displaying light scatter properties corresponding to lymphocytes. - Abnormal Cells in gated population: NA - Phenotype of Abnormal Cells:  Urinalysis No results found for this basename: colorurine, appearanceur, labspec, phurine, glucoseu, hgbur, bilirubinur, ketonesur, proteinur, urobilinogen, nitrite, leukocytesur    RADIOGRAPHIC STUDIES: No results found.  ASSESSMENT:  #1. 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 #2. Chronic obstructive pulmonary disease, continuing to smoke.    PLAN:  #1. Continue watchful expectation and surveillance. #2. Patient was told to call should he develop fever, lymphadenopathy, or easy satiety. #3. Followup in 6 months with CBC, chem profile, LDH, beta-2 microglobulin, and flow cytometry. Differential diagnosis does include large granular lymphocytic leukemia but thus far there is no flow cytometry evidence of such entity being present.   All questions were answered. The  patient knows to call the clinic with any problems, questions or concerns. We can certainly see the patient much sooner if necessary.   I spent 25 minutes counseling the patient face to face. The total time spent in the appointment was 30 minutes.    Doroteo Bradford, MD 03/22/2014 9:06 AM

## 2014-05-03 ENCOUNTER — Other Ambulatory Visit: Payer: Self-pay | Admitting: Family Medicine

## 2014-05-03 ENCOUNTER — Telehealth: Payer: Self-pay | Admitting: Family Medicine

## 2014-05-03 MED ORDER — BUDESONIDE-FORMOTEROL FUMARATE 160-4.5 MCG/ACT IN AERO: INHALATION_SPRAY | RESPIRATORY_TRACT | Status: DC

## 2014-05-03 NOTE — Telephone Encounter (Signed)
Med refilled.

## 2014-05-10 NOTE — Assessment & Plan Note (Signed)
Reducing gradually, but unable to commit to quit date at thsi time Patient counseled for approximately 5 minutes regarding the health risks of ongoing nicotine use, specifically all types of cancer, heart disease, stroke and respiratory failure. The options available for help with cessation ,the behavioral changes to assist the process, and the option to either gradully reduce usage  Or abruptly stop.is also discussed. Pt is also encouraged to set specific goals in number of cigarettes used daily, as well as to set a quit date.]

## 2014-05-10 NOTE — Progress Notes (Signed)
   Subjective:    Patient ID: [name], male    DOB: [birth year], 58 y.o.   MRN: [id number]  HPI The PT is here for follow up and re-evaluation of chronic medical conditions, medication management and review of any available recent lab and radiology data.  Preventive health is updated, specifically  Cancer screening and Immunization.    The PT denies any adverse reactions to current medications since the last visit.  Still working at smoking cessation, but unable to set quit date at this visit. Notes increased exertional dyspnea and fatigue, and relates this to ongoing nicotine. Ha upcoming f/u with oncology re lymphocytosis, needs rectal exam today. Denies any known f/h colon ca and any change in his BM or rectal blood    Review of Systems See HPI Denies recent fever or chills. Denies sinus pressure, nasal congestion, ear pain or sore throat. Denies chest congestion, productive cough or wheezing.Increased fatigue noted Denies chest pains, palpitations and leg swelling. Denies PNd or orhtopnea Denies abdominal pain, nausea, vomiting,diarrhea or constipation.   Denies dysuria, frequency, hesitancy or incontinence. Denies joint pain, swelling and limitation in mobility. Denies headaches, seizures, numbness, or tingling. Denies uncontrolled  depression, anxiety or insomnia.Improved on meds Denies skin break down or rash.        Objective:   Physical Exam BP 130/82  Pulse 101  Resp 16  Ht 5\' 10"  (1.778 m)  Wt 136 lb 1.9 oz (61.744 kg)  BMI 19.53 kg/m2  SpO2 95% Patient alert and oriented and in no cardiopulmonary distress.  HEENT: No facial asymmetry, EOMI, no sinus tenderness,  oropharynx pink and moist.  Neck supple no adenopathy.  Chest: Clear to auscultation bilaterally.Decreased air entry throughout  CVS: S1, S2 no murmurs, no S3.  ABD: Soft non tender. Bowel sounds normal. Rectal; no mass , heme negative stool Ext: No edema  MS: Adequate ROM spine,  shoulders, hips and knees.  Skin: Intact, no ulcerations or rash noted.  Psych: Good eye contact, normal affect. Memory intact not anxious or depressed appearing.  CNS: CN 2-12 intact, power, tone and sensation normal throughout.        Assessment & Plan:  Nonspecific abnormal electrocardiogram (ECG) (EKG) EKG shows LVH and reporlariztion abnormality, also chest CT scan shows CAD, will refer to cardiology for further mx of this. Nicotine cessation stressed  NICOTINE ADDICTION Reducing gradually, but unable to commit to quit date at thsi time Patient counseled for approximately 5 minutes regarding the health risks of ongoing nicotine use, specifically all types of cancer, heart disease, stroke and respiratory failure. The options available for help with cessation ,the behavioral changes to assist the process, and the option to either gradully reduce usage  Or abruptly stop.is also discussed. Pt is also encouraged to set specific goals in number of cigarettes used daily, as well as to set a quit date.]   Depression with anxiety Controlled, no change in medication   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  COPD Deteriorating due to ongoing nicotine use Daily use of inhalers as well as smoking cessation  HYPERTENSION Controlled, no change in medication DASH diet and commitment to daily physical activity for a minimum of 30 minutes discussed and encouraged, as a part of hypertension management. The importance of attaining a healthy weight is also discussed.

## 2014-05-10 NOTE — Assessment & Plan Note (Signed)
Followed by hematology 

## 2014-05-10 NOTE — Assessment & Plan Note (Signed)
EKG shows LVH and reporlariztion abnormality, also chest CT scan shows CAD, will refer to cardiology for further mx of this. Nicotine cessation stressed

## 2014-05-10 NOTE — Assessment & Plan Note (Signed)
Deteriorating due to ongoing nicotine use Daily use of inhalers as well as smoking cessation

## 2014-05-10 NOTE — Assessment & Plan Note (Signed)
Controlled, no change in medication  

## 2014-05-10 NOTE — Assessment & Plan Note (Signed)
Controlled, no change in medication DASH diet and commitment to daily physical activity for a minimum of 30 minutes discussed and encouraged, as a part of hypertension management. The importance of attaining a healthy weight is also discussed.  

## 2014-06-22 ENCOUNTER — Encounter (INDEPENDENT_AMBULATORY_CARE_PROVIDER_SITE_OTHER): Payer: Self-pay

## 2014-06-22 ENCOUNTER — Ambulatory Visit (INDEPENDENT_AMBULATORY_CARE_PROVIDER_SITE_OTHER): Payer: BC Managed Care – PPO | Admitting: Family Medicine

## 2014-06-22 ENCOUNTER — Encounter: Payer: Self-pay | Admitting: Family Medicine

## 2014-06-22 VITALS — BP 130/90 | HR 82 | Resp 16 | Ht 70.0 in | Wt 133.1 lb

## 2014-06-22 DIAGNOSIS — F17200 Nicotine dependence, unspecified, uncomplicated: Secondary | ICD-10-CM

## 2014-06-22 DIAGNOSIS — I10 Essential (primary) hypertension: Secondary | ICD-10-CM

## 2014-06-22 DIAGNOSIS — F418 Other specified anxiety disorders: Secondary | ICD-10-CM

## 2014-06-22 DIAGNOSIS — F341 Dysthymic disorder: Secondary | ICD-10-CM

## 2014-06-22 DIAGNOSIS — J449 Chronic obstructive pulmonary disease, unspecified: Secondary | ICD-10-CM

## 2014-06-22 DIAGNOSIS — J4489 Other specified chronic obstructive pulmonary disease: Secondary | ICD-10-CM

## 2014-06-22 DIAGNOSIS — Z23 Encounter for immunization: Secondary | ICD-10-CM

## 2014-06-22 MED ORDER — CITALOPRAM HYDROBROMIDE 10 MG PO TABS: ORAL_TABLET | ORAL | Status: DC

## 2014-06-22 MED ORDER — AMLODIPINE BESYLATE 10 MG PO TABS: ORAL_TABLET | ORAL | Status: DC

## 2014-06-22 MED ORDER — MIRTAZAPINE 15 MG PO TBDP
15.0000 mg | ORAL_TABLET | Freq: Every day | ORAL | Status: DC
Start: 2014-06-22 — End: 2015-03-24

## 2014-06-22 NOTE — Patient Instructions (Signed)
Annual physical exam in 4 month, call if you need me before  Congrats on getting down to 2 ciggs daily, you need to quit, continue to work on this please  New additional medication take 30 mins before bedtime, remeron, this will improve how long you sleep for and also help with your appetite  Please continue to reduce alcohol to protect your liver  Prevnar today  Chrem 7 fasting in Nov before f/u please  BP is s high, please work on this, reduce salt in diet

## 2014-09-02 ENCOUNTER — Other Ambulatory Visit: Payer: Self-pay | Admitting: Family Medicine

## 2014-09-15 ENCOUNTER — Encounter (HOSPITAL_COMMUNITY): Payer: BC Managed Care – PPO | Attending: Hematology and Oncology

## 2014-09-15 DIAGNOSIS — D72820 Lymphocytosis (symptomatic): Secondary | ICD-10-CM | POA: Diagnosis present

## 2014-09-15 LAB — COMPREHENSIVE METABOLIC PANEL
ALK PHOS: 60 U/L (ref 39–117)
ALT: 24 U/L (ref 0–53)
AST: 31 U/L (ref 0–37)
Albumin: 3.8 g/dL (ref 3.5–5.2)
Anion gap: 8 (ref 5–15)
BUN: 16 mg/dL (ref 6–23)
CHLORIDE: 99 meq/L (ref 96–112)
CO2: 29 meq/L (ref 19–32)
Calcium: 9.3 mg/dL (ref 8.4–10.5)
Creatinine, Ser: 0.96 mg/dL (ref 0.50–1.35)
GFR calc Af Amer: >90 mL/min (ref >90)
Glucose, Bld: 90 mg/dL (ref 70–99)
POTASSIUM: 4.6 meq/L (ref 3.7–5.3)
Sodium: 136 mEq/L — ABNORMAL LOW (ref 137–147)
Total Bilirubin: 0.9 mg/dL (ref 0.3–1.2)
Total Protein: 7.5 g/dL (ref 6.0–8.3)

## 2014-09-15 LAB — CBC WITH DIFFERENTIAL/PLATELET
Basophils Absolute: 0.2 10*3/uL — ABNORMAL HIGH (ref 0.0–0.1)
Basophils Relative: 3 % — ABNORMAL HIGH (ref 0–1)
EOS PCT: 4 % (ref 0–5)
Eosinophils Absolute: 0.2 10*3/uL (ref 0.0–0.7)
HCT: 42.3 % (ref 39.0–52.0)
Hemoglobin: 14.5 g/dL (ref 13.0–17.0)
LYMPHS ABS: 2.2 10*3/uL (ref 0.7–4.0)
LYMPHS PCT: 40 % (ref 12–46)
MCH: 29.9 pg (ref 26.0–34.0)
MCHC: 34.3 g/dL (ref 30.0–36.0)
MCV: 87.2 fL (ref 78.0–100.0)
Monocytes Absolute: 1.2 10*3/uL — ABNORMAL HIGH (ref 0.1–1.0)
Monocytes Relative: 23 % — ABNORMAL HIGH (ref 3–12)
Neutro Abs: 1.7 10*3/uL (ref 1.7–7.7)
Neutrophils Relative %: 31 % — ABNORMAL LOW (ref 43–77)
PLATELETS: 226 10*3/uL (ref 150–400)
RBC: 4.85 MIL/uL (ref 4.22–5.81)
RDW: 12.7 % (ref 11.5–15.5)
WBC: 5.5 10*3/uL (ref 4.0–10.5)

## 2014-09-15 LAB — LACTATE DEHYDROGENASE: LDH: 173 U/L (ref 94–250)

## 2014-09-15 NOTE — Progress Notes (Signed)
Labs for ldh,cmp,cbcd,flow cyto

## 2014-09-16 ENCOUNTER — Other Ambulatory Visit (HOSPITAL_COMMUNITY): Payer: 59

## 2014-09-27 ENCOUNTER — Encounter (HOSPITAL_COMMUNITY): Payer: BC Managed Care – PPO | Attending: Hematology and Oncology

## 2014-09-27 ENCOUNTER — Encounter (HOSPITAL_BASED_OUTPATIENT_CLINIC_OR_DEPARTMENT_OTHER): Payer: BC Managed Care – PPO

## 2014-09-27 ENCOUNTER — Encounter (HOSPITAL_COMMUNITY): Payer: Self-pay

## 2014-09-27 VITALS — BP 105/65 | HR 86 | Temp 97.8°F | Resp 18 | Wt 140.0 lb

## 2014-09-27 DIAGNOSIS — J449 Chronic obstructive pulmonary disease, unspecified: Secondary | ICD-10-CM

## 2014-09-27 DIAGNOSIS — D709 Neutropenia, unspecified: Secondary | ICD-10-CM | POA: Diagnosis present

## 2014-09-27 DIAGNOSIS — D72820 Lymphocytosis (symptomatic): Secondary | ICD-10-CM | POA: Diagnosis present

## 2014-09-27 DIAGNOSIS — Z23 Encounter for immunization: Secondary | ICD-10-CM

## 2014-09-27 LAB — COMPREHENSIVE METABOLIC PANEL
ALBUMIN: 3.6 g/dL (ref 3.5–5.2)
ALK PHOS: 69 U/L (ref 39–117)
ALT: 18 U/L (ref 0–53)
ANION GAP: 14 (ref 5–15)
AST: 23 U/L (ref 0–37)
BUN: 12 mg/dL (ref 6–23)
CO2: 25 mEq/L (ref 19–32)
Calcium: 9.1 mg/dL (ref 8.4–10.5)
Chloride: 98 mEq/L (ref 96–112)
Creatinine, Ser: 1.16 mg/dL (ref 0.50–1.35)
GFR calc Af Amer: 80 mL/min — ABNORMAL LOW (ref >90)
GFR calc non Af Amer: 69 mL/min — ABNORMAL LOW (ref >90)
Glucose, Bld: 80 mg/dL (ref 70–99)
Potassium: 3.6 mEq/L — ABNORMAL LOW (ref 3.7–5.3)
Sodium: 137 mEq/L (ref 137–147)
Total Bilirubin: 0.2 mg/dL — ABNORMAL LOW (ref 0.3–1.2)
Total Protein: 7.5 g/dL (ref 6.0–8.3)

## 2014-09-27 LAB — CBC WITH DIFFERENTIAL/PLATELET
Basophils Absolute: 0.1 10*3/uL (ref 0.0–0.1)
Basophils Relative: 2 % — ABNORMAL HIGH (ref 0–1)
EOS ABS: 0.2 10*3/uL (ref 0.0–0.7)
EOS PCT: 5 % (ref 0–5)
HEMATOCRIT: 41.5 % (ref 39.0–52.0)
HEMOGLOBIN: 14.4 g/dL (ref 13.0–17.0)
LYMPHS ABS: 2.1 10*3/uL (ref 0.7–4.0)
Lymphocytes Relative: 47 % — ABNORMAL HIGH (ref 12–46)
MCH: 29.6 pg (ref 26.0–34.0)
MCHC: 34.7 g/dL (ref 30.0–36.0)
MCV: 85.4 fL (ref 78.0–100.0)
MONO ABS: 0.5 10*3/uL (ref 0.1–1.0)
Monocytes Relative: 11 % (ref 3–12)
Neutro Abs: 1.6 10*3/uL — ABNORMAL LOW (ref 1.7–7.7)
Neutrophils Relative %: 36 % — ABNORMAL LOW (ref 43–77)
Platelets: 291 10*3/uL (ref 150–400)
RBC: 4.86 MIL/uL (ref 4.22–5.81)
RDW: 12.4 % (ref 11.5–15.5)
WBC: 4.6 10*3/uL (ref 4.0–10.5)

## 2014-09-27 LAB — RETICULOCYTES
RBC.: 4.86 MIL/uL (ref 4.22–5.81)
Retic Count, Absolute: 58.3 10*3/uL (ref 19.0–186.0)
Retic Ct Pct: 1.2 % (ref 0.4–3.1)

## 2014-09-27 LAB — LACTATE DEHYDROGENASE: LDH: 158 U/L (ref 94–250)

## 2014-09-27 MED ORDER — INFLUENZA VAC SPLIT QUAD 0.5 ML IM SUSY
0.5000 mL | PREFILLED_SYRINGE | Freq: Once | INTRAMUSCULAR | Status: AC
  Administered 2014-09-27: 0.5 mL via INTRAMUSCULAR
  Filled 2014-09-27: qty 0.5

## 2014-09-27 NOTE — Progress Notes (Signed)
Noel GeroldErnest D Scarpelli Received the flu shot today

## 2014-09-27 NOTE — Progress Notes (Signed)
Labs for ldh,cmp,cbcd,retic

## 2014-09-27 NOTE — Patient Instructions (Signed)
[facility] Discharge Instructions  RECOMMENDATIONS MADE BY THE CONSULTANT AND ANY TEST RESULTS WILL BE SENT TO YOUR REFERRING PHYSICIAN.  EXAM FINDINGS BY THE PHYSICIAN TODAY AND SIGNS OR SYMPTOMS TO REPORT TO CLINIC OR PRIMARY PHYSICIAN: You seen Dr Zigmund DanielFormanek today  Follow up in 6 months with lab work.  You received a flu shot today  Thank you for choosing [facility] to provide your oncology and hematology care.  To afford each patient quality time with our providers, please arrive at least 15 minutes before your scheduled appointment time.  With your help, our goal is to use those 15 minutes to complete the necessary work-up to ensure our physicians have the information they need to help with your evaluation and healthcare recommendations.    Effective January 1st, 2014, we ask that you re-schedule your appointment with our physicians should you arrive 10 or more minutes late for your appointment.  We strive to give you quality time with our providers, and arriving late affects you and other patients whose appointments are after yours.    Again, thank you for choosing [facility].  Our hope is that these requests will decrease the amount of time that you wait before being seen by our physicians.       _____________________________________________________________  Should you have questions after your visit to [facility], please contact our office at [phone] between the hours of 8:30 a.m. and 5:00 p.m.  Voicemails left after 4:30 p.m. will not be returned until the following business day.  For prescription refill requests, have your pharmacy contact our office with your prescription refill request.

## 2014-09-27 NOTE — Progress Notes (Signed)
[facility]  OFFICE PROGRESS NOTE  Tula Nakayama, MD [address] [place] [address]  DIAGNOSIS: Lymphocytosis - Plan: CBC with Differential, Reticulocytes, Comprehensive metabolic panel, Lactate dehydrogenase, Flow Cytometry  Neutropenia - Plan: CBC with Differential  No chief complaint on file.   CURRENT THERAPY: Watchful expectation and surveillance  INTERVAL HISTORY: [name] 58 y.o. male returns for followup of neutropenia with absolute lymphocytosis having received no treatment in the past. Flow cytometry performed on 03/15/2014 showed no evidence of a lymphoproliferative disorder. He continues to do well with no fever, night sweats, easy satiety, lower extremity swelling or redness, diarrhea, constipation, cough, wheezing, but continues to smoke. He denies any expectoration, hemoptysis, chest pain, PND, orthopnea, palpitations, melena, hematochezia, hematuria, skin rash, headache, or seizures.    MEDICAL HISTORY: Past Medical History  Diagnosis Date  . COPD (chronic obstructive pulmonary disease)   . Essential hypertension, benign   . Nicotine addiction   . Alcohol abuse   . Asthma   . History of neutropenia     Also lymphocytosis - followed by Dr. Barnet Glasgow  . Coronary atherosclerosis     Based on chest CT 2012    INTERIM HISTORY: has NICOTINE ADDICTION; HYPERTENSION; COPD; MALNUTRITION, MILD; ALCOHOL ABUSE; ALLERGIC RHINITIS CAUSE UNSPECIFIED; Asthma with COPD; Lymphocytosis; Angioedema of lips; Neutropenia; Routine general medical examination at a health care facility; Depression with anxiety; Nonspecific abnormal electrocardiogram (ECG) (EKG); and Coronary atherosclerosis on his problem list.    ALLERGIES:  is allergic to lisinopril.  MEDICATIONS: has a current medication list which includes the following prescription(s): albuterol, amlodipine, aspirin, citalopram, ergocalciferol, mirtazapine,  symbicort, and triamterene-hydrochlorothiazide.  SURGICAL HISTORY:  Past Surgical History  Procedure Laterality Date  . Multiple tooth extractions  June 2012    FAMILY HISTORY: family history includes Diabetes in his father and mother; Hypertension in his mother and sister.  SOCIAL HISTORY:  reports that he has been smoking Cigarettes.  He has been smoking about 0.50 packs per day. He has never used smokeless tobacco. He reports that he drinks about 10.8 ounces of alcohol per week. He reports that he does not use illicit drugs.  REVIEW OF SYSTEMS:  Other than that discussed above is noncontributory.  PHYSICAL EXAMINATION: ECOG PERFORMANCE STATUS: 1 - Symptomatic but completely ambulatory  There were no vitals taken for this visit.  GENERAL:alert, no distress and comfortable SKIN: skin color, texture, turgor are normal, no rashes or significant lesions EYES: PERLA; Conjunctiva are pink and non-injected, sclera clear SINUSES: No redness or tenderness over maxillary or ethmoid sinuses OROPHARYNX:no exudate, no erythema on lips, buccal mucosa, or tongue. NECK: supple, thyroid normal size, non-tender, without nodularity. No masses CHEST: Increased AP diameter with no breast masses. LYMPH:  no palpable lymphadenopathy in the cervical, axillary or inguinal LUNGS: clear to auscultation and percussion with normal breathing effort HEART: regular rate & rhythm and no murmurs. ABDOMEN:abdomen soft, non-tender and normal bowel sounds. Liver and spleen not enlarged. MUSCULOSKELETAL:no cyanosis of digits and no clubbing. Range of motion normal.  NEURO: alert & oriented x 3 with fluent speech, no focal motor/sensory deficits   LABORATORY DATA: Lab on 09/15/2014  Component Date Value Ref Range Status  . WBC 09/15/2014 5.5  4.0 - 10.5 K/uL Final  . RBC 09/15/2014 4.85  4.22 - 5.81 MIL/uL Final  . Hemoglobin 09/15/2014 14.5  13.0 - 17.0 g/dL Final  . HCT 09/15/2014 42.3  39.0 -  52.0 % Final  .  MCV 09/15/2014 87.2  78.0 - 100.0 fL Final  . MCH 09/15/2014 29.9  26.0 - 34.0 pg Final  . MCHC 09/15/2014 34.3  30.0 - 36.0 g/dL Final  . RDW 09/15/2014 12.7  11.5 - 15.5 % Final  . Platelets 09/15/2014 226  150 - 400 K/uL Final  . Neutrophils Relative % 09/15/2014 31* 43 - 77 % Final  . Neutro Abs 09/15/2014 1.7  1.7 - 7.7 K/uL Final  . Lymphocytes Relative 09/15/2014 40  12 - 46 % Final  . Lymphs Abs 09/15/2014 2.2  0.7 - 4.0 K/uL Final  . Monocytes Relative 09/15/2014 23* 3 - 12 % Final  . Monocytes Absolute 09/15/2014 1.2* 0.1 - 1.0 K/uL Final  . Eosinophils Relative 09/15/2014 4  0 - 5 % Final  . Eosinophils Absolute 09/15/2014 0.2  0.0 - 0.7 K/uL Final  . Basophils Relative 09/15/2014 3* 0 - 1 % Final  . Basophils Absolute 09/15/2014 0.2* 0.0 - 0.1 K/uL Final  . WBC Morphology 09/15/2014 ATYPICAL LYMPHOCYTES   Final  . Sodium 09/15/2014 136* 137 - 147 mEq/L Final  . Potassium 09/15/2014 4.6  3.7 - 5.3 mEq/L Final  . Chloride 09/15/2014 99  96 - 112 mEq/L Final  . CO2 09/15/2014 29  19 - 32 mEq/L Final  . Glucose, Bld 09/15/2014 90  70 - 99 mg/dL Final  . BUN 09/15/2014 16  6 - 23 mg/dL Final  . Creatinine, Ser 09/15/2014 0.96  0.50 - 1.35 mg/dL Final  . Calcium 09/15/2014 9.3  8.4 - 10.5 mg/dL Final  . Total Protein 09/15/2014 7.5  6.0 - 8.3 g/dL Final  . Albumin 09/15/2014 3.8  3.5 - 5.2 g/dL Final  . AST 09/15/2014 31  0 - 37 U/L Final  . ALT 09/15/2014 24  0 - 53 U/L Final  . Alkaline Phosphatase 09/15/2014 60  39 - 117 U/L Final  . Total Bilirubin 09/15/2014 0.9  0.3 - 1.2 mg/dL Final  . GFR calc non Af Amer 09/15/2014 >90  >90 mL/min Final  . GFR calc Af Amer 09/15/2014 >90  >90 mL/min Final   Comment: (NOTE)                          The eGFR has been calculated using the CKD EPI equation.                          This calculation has not been validated in all clinical situations.                          eGFR's persistently <90 mL/min signify possible Chronic Kidney                           Disease.  . Anion gap 09/15/2014 8  5 - 15 Final  . LDH 09/15/2014 173  94 - 250 U/L Final    PATHOLOGY:  FINAL for Kleinschmidt, Guiseppe D [phone]) Patient: Milderd Meager D Collected: 09/15/2014 Client: [facility] Accession: [id number] Received: 09/15/2014 Farrel Gobble, MD DOB: 04-Dec-1956 Age: 69 Gender: M Reported: 09/15/2014 [address] Patient Ph: [phone] MRN #: [id number] Linna Hoff [place] [address] Visit #: [id number] Chart #: Phone: Fax: CC: FLOW CYTOMETRY REPORT INTERPRETATION Interpretation Peripheral Blood Flow Cytometry - NO MONOCLONAL B-CELL POPULATION OR ABNORMAL T-CELL PHENOTYPE  IDENTIFIED. Susanne Greenhouse MD Pathologist, Electronic Signature (Case signed 09/15/2014) GROSS AND MICROSCOPIC INFORMATION Source Peripheral Blood Flow Cytometry Microscopic Gated population: Flow cytometric immunophenotyping is performed using antibiodies to the antigens listed in the table below. Electronic gates are placed around a cell cluster displaying light scatter properties corresponding to lymphocytes. - Abnormal Cells in gated population: NA - Phenotype of Abnormal Cells:  Urinalysis No results found for this basename: colorurine, appearanceur, labspec, phurine, glucoseu, hgbur, bilirubinur, ketonesur, proteinur, urobilinogen, nitrite, leukocytesur    RADIOGRAPHIC STUDIES: No results found.  ASSESSMENT:  #1. 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  #2. Chronic obstructive pulmonary disease, continuing to smoke.       PLAN:  #1. Continue watchful expectation and surveillance.  #2. Patient was told to call should he develop fever, lymphadenopathy, or easy satiety.  #3. Followup in 6 months with CBC, chem profile, LDH, beta-2 microglobulin, and flow cytometry. Differential diagnosis does include large granular lymphocytic leukemia but thus far there is no  flow cytometry evidence of such entity being present.  #4. Influenza virus vaccine was given.      All questions were answered. The patient knows to call the clinic with any problems, questions or concerns. We can certainly see the patient much sooner if necessary.   I spent 25 minutes counseling the patient face to face. The total time spent in the appointment was 30 minutes.    Doroteo Bradford, MD 09/27/2014 8:32 AM  DISCLAIMER:  This note was dictated with voice recognition software.  Similar sounding words can inadvertently be transcribed inaccurately and may not be corrected upon review.

## 2014-10-14 DIAGNOSIS — Z23 Encounter for immunization: Secondary | ICD-10-CM | POA: Insufficient documentation

## 2014-10-14 NOTE — Assessment & Plan Note (Signed)
Vaccine administered at visit.  

## 2014-10-14 NOTE — Assessment & Plan Note (Signed)
Cutting back near quitting, but unwilling to set date at this time Patient counseled for approximately 5 minutes regarding the health risks of ongoing nicotine use, specifically all types of cancer, heart disease, stroke and respiratory failure. The options available for help with cessation ,the behavioral changes to assist the process, and the option to either gradully reduce usage  Or abruptly stop.is also discussed. Pt is also encouraged to set specific goals in number of cigarettes used daily, as well as to set a quit date.

## 2014-10-14 NOTE — Progress Notes (Signed)
   Subjective:    Patient ID: [name], male    DOB: 1956-11-23, 58 y.o.   MRN: [id number]  HPI The PT is here for follow up and re-evaluation of chronic medical conditions, medication management and review of any available recent lab and radiology data.  Preventive health is updated, specifically  Cancer screening and Immunization.   Questions or concerns regarding consultations or procedures which the PT has had in the interim are  addressed. The PT denies any adverse reactions to current medications since the last visit.  There are no new concerns.  There are no specific complaints       Review of Systems See HPI Denies recent fever or chills. Denies sinus pressure, nasal congestion, ear pain or sore throat. Denies chest congestion, productive cough or wheezing. Denies chest pains, palpitations and leg swelling Denies abdominal pain, nausea, vomiting,diarrhea or constipation.   Denies dysuria, frequency, hesitancy or incontinence. Denies joint pain, swelling and limitation in mobility. Denies headaches, seizures, numbness, or tingling. Denies depression, anxiety or insomnia. Denies skin break down or rash.        Objective:   Physical Exam BP 130/90  Pulse 82  Resp 16  Ht 5\' 10"  (1.778 m)  Wt 133 lb 1.9 oz (60.383 kg)  BMI 19.10 kg/m2  SpO2 99% Patient alert and oriented and in no cardiopulmonary distress.  HEENT: No facial asymmetry, EOMI,   oropharynx pink and moist.  Neck supple no JVD, no mass.  Chest: Clear to auscultation bilaterally.Dcreased air entry throughout  CVS: S1, S2 no murmurs, no S3.Regular rate.  ABD: Soft non tender.   Ext: No edema  MS: Adequate ROM spine, shoulders, hips and knees.  Skin: Intact, no ulcerations or rash noted.  Psych: Good eye contact, normal affect. Memory intact not anxious or depressed appearing.  CNS: CN 2-12 intact, power,  normal throughout.no focal deficits noted.        Assessment & Plan:    Depression with anxiety Inadequate control, poor appetite and poor sleep add remron Pt not suicidal or homicidal   Nicotine dependence, cigarettes, with other nicotine-induced disorders Cutting back near quitting, but unwilling to set date at this time Patient counseled for approximately 5 minutes regarding the health risks of ongoing nicotine use, specifically all types of cancer, heart disease, stroke and respiratory failure. The options available for help with cessation ,the behavioral changes to assist the process, and the option to either gradully reduce usage  Or abruptly stop.is also discussed. Pt is also encouraged to set specific goals in number of cigarettes used daily, as well as to set a quit date.   Essential hypertension Diastolic still elevated, no med change DASH diet and commitment to daily physical activity for a minimum of 30 minutes discussed and encouraged, as a part of hypertension management. The importance of attaining a healthy weight is also discussed.   Asthma with COPD Encouraged use of symbicort daily to improve breathing, cost seems to be a limiting factor, but educated pt of the value of regular use  Need for vaccination with 13-polyvalent pneumococcal conjugate vaccine Vaccine administered at visit.

## 2014-10-14 NOTE — Assessment & Plan Note (Signed)
Inadequate control, poor appetite and poor sleep add remron Pt not suicidal or homicidal

## 2014-10-14 NOTE — Assessment & Plan Note (Signed)
Diastolic still elevated, no med change DASH diet and commitment to daily physical activity for a minimum of 30 minutes discussed and encouraged, as a part of hypertension management. The importance of attaining a healthy weight is also discussed.

## 2014-10-14 NOTE — Assessment & Plan Note (Signed)
Encouraged use of symbicort daily to improve breathing, cost seems to be a limiting factor, but educated pt of the value of regular use

## 2014-11-01 ENCOUNTER — Other Ambulatory Visit: Payer: Self-pay | Admitting: Family Medicine

## 2014-11-10 ENCOUNTER — Ambulatory Visit (INDEPENDENT_AMBULATORY_CARE_PROVIDER_SITE_OTHER): Payer: BC Managed Care – PPO | Admitting: Family Medicine

## 2014-11-10 ENCOUNTER — Encounter: Payer: Self-pay | Admitting: Family Medicine

## 2014-11-10 VITALS — BP 128/82 | HR 88 | Resp 16 | Ht 72.25 in | Wt 136.1 lb

## 2014-11-10 DIAGNOSIS — Z0000 Encounter for general adult medical examination without abnormal findings: Secondary | ICD-10-CM | POA: Insufficient documentation

## 2014-11-10 DIAGNOSIS — Z1211 Encounter for screening for malignant neoplasm of colon: Secondary | ICD-10-CM

## 2014-11-10 DIAGNOSIS — F418 Other specified anxiety disorders: Secondary | ICD-10-CM

## 2014-11-10 DIAGNOSIS — I10 Essential (primary) hypertension: Secondary | ICD-10-CM

## 2014-11-10 DIAGNOSIS — Z125 Encounter for screening for malignant neoplasm of prostate: Secondary | ICD-10-CM

## 2014-11-10 LAB — POC HEMOCCULT BLD/STL (OFFICE/1-CARD/DIAGNOSTIC): Fecal Occult Blood, POC: NEGATIVE

## 2014-11-10 MED ORDER — CITALOPRAM HYDROBROMIDE 20 MG PO TABS: 20.0000 mg | ORAL_TABLET | Freq: Every day | ORAL | Status: DC

## 2014-11-10 MED ORDER — TRIAMTERENE-HCTZ 75-50 MG PO TABS: 1.0000 | ORAL_TABLET | Freq: Every day | ORAL | Status: DC

## 2014-11-10 NOTE — Assessment & Plan Note (Signed)

## 2014-11-10 NOTE — Progress Notes (Signed)
   Subjective:    Patient ID: [name], male    DOB: September 23, 1956, 58 y.o.   MRN: [id number]  HPI Patient is in for annual physical exam. Pt extremely anxious and streesed about recent bill for lab work, also describes problems with dealing with his co workers Not suicidal or homicidal. Nicotine cessation counseling also done, he is trying to quit but continues to have difficulty with this, will continue to cut back    Review of Systems See HPI     Objective:   Physical Exam BP 128/82 mmHg  Pulse 88  Resp 16  Ht 6' 0.25" (1.835 m)  Wt 136 lb 1.9 oz (61.744 kg)  BMI 18.34 kg/m2  SpO2 99%  Pleasant well nourished male, alert and oriented x 3, in no cardio-pulmonary distress. Afebrile. HEENT No facial trauma or asymetry. Sinuses non tender. EOMI, PERTL, fundoscopic exam is negative for hemorhages or exudates. External ears normal, tympanic membranes clear. Oropharynx moist, no exudate, fair dentition. Neck: supple, no adenopathy,JVD or thyromegaly.No bruits.  Chest: Clear to ascultation bilaterally.No crackles or wheezes. Non tender to palpation  Breast: No asymetry,no masses. No nipple discharge or inversion. No axillary or supraclavicular adenopathy  Cardiovascular system; Heart sounds normal,  S1 and  S2 ,no S3.  No murmur, or thrill. Apical beat not displaced Peripheral pulses normal.  Abdomen: Soft, non tender, no organomegaly or masses. No bruits. Bowel sounds normal. No guarding, tenderness or rebound.  Rectal:  Normal sphincter tone. No hemorrhoids or  masses. guaiac negative stool. Prostate smooth and firm    Musculoskeletal exam: Full ROM of spine, hips , shoulders and knees. No deformity ,swelling or crepitus noted. No muscle wasting or atrophy.   Neurologic: Cranial nerves 2 to 12 intact. Power, tone ,sensation and reflexes normal throughout. No disturbance in gait. No tremor.  Skin: Intact, no ulceration, erythema , scaling or  rash noted. Pigmentation normal throughout  Psych; Patient agitated and anxious. Judgement and concentration normal         Assessment & Plan:  Annual physical exam Annual exam as documented. Counseling done  re healthy lifestyle involving commitment to 150 minutes exercise per week, heart healthy diet, and attaining healthy weight.The importance of adequate sleep also discussed. Regular seat belt use and home safety, is also discussed. Changes in health habits are decided on by the patient with goals and time frames  set for achieving them. Immunization and cancer screening needs are specifically addressed at this visit.   Depression with anxiety Uncontrolled anxiety, increase dose of citalopram, and encouraged to practice relaxation techniques No suicidal or homicidal ideation

## 2014-11-10 NOTE — Patient Instructions (Addendum)
F/u early April call if you need me before  Exam today is good, BUT PLEASE try to calm down and not get stressed out too much , espcialy over things you cannot control  Call and make payment arrangements, and stick with them, and also speak with you employer re any further assistance you may be able to get to pay the bill from your health insurance   Increase in citalopram to 20 mg daily, this will help with anxiety  Continue to work on smoking cessation, now you are between 5 to 7 per day, hopefully by April you will be down to 3 per day or Quit  Fasting chem 7 and PSA early April

## 2014-11-13 NOTE — Assessment & Plan Note (Signed)
Uncontrolled anxiety, increase dose of citalopram, and encouraged to practice relaxation techniques No suicidal or homicidal ideation

## 2014-12-21 ENCOUNTER — Other Ambulatory Visit: Payer: Self-pay | Admitting: Family Medicine

## 2015-03-22 ENCOUNTER — Other Ambulatory Visit (HOSPITAL_COMMUNITY): Payer: Self-pay

## 2015-03-22 DIAGNOSIS — D72820 Lymphocytosis (symptomatic): Secondary | ICD-10-CM

## 2015-03-24 ENCOUNTER — Ambulatory Visit (INDEPENDENT_AMBULATORY_CARE_PROVIDER_SITE_OTHER): Payer: BLUE CROSS/BLUE SHIELD | Admitting: Family Medicine

## 2015-03-24 ENCOUNTER — Ambulatory Visit: Payer: BC Managed Care – PPO | Admitting: Family Medicine

## 2015-03-24 ENCOUNTER — Encounter: Payer: Self-pay | Admitting: Family Medicine

## 2015-03-24 VITALS — BP 124/82 | HR 80 | Resp 16 | Ht 72.0 in | Wt 131.8 lb

## 2015-03-24 DIAGNOSIS — F1010 Alcohol abuse, uncomplicated: Secondary | ICD-10-CM | POA: Diagnosis not present

## 2015-03-24 DIAGNOSIS — F418 Other specified anxiety disorders: Secondary | ICD-10-CM | POA: Diagnosis not present

## 2015-03-24 DIAGNOSIS — I10 Essential (primary) hypertension: Secondary | ICD-10-CM | POA: Diagnosis not present

## 2015-03-24 DIAGNOSIS — E559 Vitamin D deficiency, unspecified: Secondary | ICD-10-CM | POA: Insufficient documentation

## 2015-03-24 DIAGNOSIS — Z125 Encounter for screening for malignant neoplasm of prostate: Secondary | ICD-10-CM

## 2015-03-24 DIAGNOSIS — F17218 Nicotine dependence, cigarettes, with other nicotine-induced disorders: Secondary | ICD-10-CM | POA: Diagnosis not present

## 2015-03-24 DIAGNOSIS — Z1159 Encounter for screening for other viral diseases: Secondary | ICD-10-CM

## 2015-03-24 DIAGNOSIS — R636 Underweight: Secondary | ICD-10-CM | POA: Insufficient documentation

## 2015-03-24 LAB — BASIC METABOLIC PANEL WITH GFR
BUN: 11 mg/dL (ref 6–23)
CALCIUM: 8.7 mg/dL (ref 8.4–10.5)
CO2: 25 meq/L (ref 19–32)
Chloride: 89 mEq/L — ABNORMAL LOW (ref 96–112)
Creat: 0.92 mg/dL (ref 0.50–1.35)
GFR, Est African American: >89 mL/min
GFR, Est Non African American: >89 mL/min
GLUCOSE: 63 mg/dL — AB (ref 70–99)
POTASSIUM: 3.7 meq/L (ref 3.5–5.3)
Sodium: 125 mEq/L — ABNORMAL LOW (ref 135–145)

## 2015-03-24 LAB — LIPID PANEL
CHOL/HDL RATIO: 1.9 ratio
Cholesterol: 170 mg/dL (ref 0–200)
HDL: 88 mg/dL (ref >=40)
LDL CALC: 62 mg/dL (ref 0–99)
TRIGLYCERIDES: 102 mg/dL (ref <150)
VLDL: 20 mg/dL (ref 0–40)

## 2015-03-24 LAB — TSH: TSH: 1.997 u[IU]/mL (ref 0.350–4.500)

## 2015-03-24 MED ORDER — MEGESTROL ACETATE 20 MG PO TABS: 20.0000 mg | ORAL_TABLET | Freq: Every day | ORAL | Status: DC

## 2015-03-24 MED ORDER — ERGOCALCIFEROL 1.25 MG (50000 UT) PO CAPS: 50000.0000 [IU] | ORAL_CAPSULE | ORAL | Status: DC

## 2015-03-24 NOTE — Assessment & Plan Note (Signed)
Controlled, no change in medication  

## 2015-03-24 NOTE — Progress Notes (Signed)
   Subjective:    Patient ID: Noel Geroldrnest D Marini, male    DOB: September 18, 1956, 59 y.o.   MRN: [id number]  HPI The PT is here for follow up and re-evaluation of chronic medical conditions, medication management and review of any available recent lab and radiology data.  Preventive health is updated, specifically  Cancer screening and Immunization.    The PT denies any adverse reactions to current medications since the last visit.  There are no new concerns.  There are no specific complaints       Review of Systems See HPI Denies recent fever or chills. Denies sinus pressure, nasal congestion, ear pain or sore throat. Denies chest congestion, productive cough or wheezing. Denies chest pains, palpitations and leg swelling Denies abdominal pain, nausea, vomiting,diarrhea or constipation.   Denies dysuria, frequency, hesitancy or incontinence. Denies joint pain, swelling and limitation in mobility. Denies headaches, seizures, numbness, or tingling. Denies uncontrolled  depression, anxiety or insomnia.Feels much better on celexa recently started Denies skin break down or rash.        Objective:   Physical Exam  BP 124/82 mmHg  Pulse 80  Resp 16  Ht 6' (1.829 m)  Wt 131 lb 12 oz (59.761 kg)  BMI 17.86 kg/m2  SpO2 100% Patient alert and oriented and in no cardiopulmonary distress.  HEENT: No facial asymmetry, EOMI,   oropharynx pink and moist.  Neck supple no JVD, no mass.  Chest: Clear to auscultation bilaterally.Decreased though adequate air entry  CVS: S1, S2 no murmurs, no S3.Regular rate.  ABD: Soft non tender.   Ext: No edema  MS: Adequate ROM spine, shoulders, hips and knees.  Skin: Intact, no ulcerations or rash noted.  Psych: Good eye contact, normal affect. Memory intact mildly  anxious not  depressed appearing.  CNS: CN 2-12 intact, power,  normal throughout.no focal deficits noted.       Assessment & Plan:  Essential hypertension Controlled, no change  in medication   Nicotine dependence, cigarettes, with other nicotine-induced disorders Improved Patient counseled for approximately 5 minutes regarding the health risks of ongoing nicotine use, specifically all types of cancer, heart disease, stroke and respiratory failure. The options available for help with cessation ,the behavioral changes to assist the process, and the option to either gradully reduce usage  Or abruptly stop.is also discussed. Pt is also encouraged to set specific goals in number of cigarettes used daily, as well as to set a quit date.  Number of cigarettes/cigars currently smoking daily: 5  Plans to quit by August 2016   Depression with anxiety Marked improvement on celexa, states he is much better since starting celexa, less aggravated  By work [employer]  Alcohol abuse No plan on reducing despite encouraging pt to do so  Underweight Start low dose megace to help with appetite, and reduce beer intake  Vitamin D deficiency Start weekly vit d  COPD Moderately severe, however  Pt trying to quit nicotine and has reduced use significantly

## 2015-03-24 NOTE — Assessment & Plan Note (Signed)
No plan on reducing despite encouraging pt to do so

## 2015-03-24 NOTE — Patient Instructions (Signed)
F/u in 3.5 month, call if you need me before  Good that you are cutting back on cigarettes, pls try to quit in the next 4 months  Pls work on reducing to eight beers per weekend , down from 10  New medication to help with appetite and weight gain, please take every day  Thankful medication for anxiety and depression is helping  New once weekly vit D capsule pls take every week for next 6 months at least  BP is excellent  Lipid, cmp, Vit D, pSA , TSH , HIV today

## 2015-03-24 NOTE — Assessment & Plan Note (Signed)
Improved Patient counseled for approximately 5 minutes regarding the health risks of ongoing nicotine use, specifically all types of cancer, heart disease, stroke and respiratory failure. The options available for help with cessation ,the behavioral changes to assist the process, and the option to either gradully reduce usage  Or abruptly stop.is also discussed. Pt is also encouraged to set specific goals in number of cigarettes used daily, as well as to set a quit date.  Number of cigarettes/cigars currently smoking daily: 5  Plans to quit by August 2016

## 2015-03-24 NOTE — Assessment & Plan Note (Signed)
Marked improvement on celexa, states he is much better since starting celexa, less aggravated  By work [employer]

## 2015-03-25 LAB — HIV ANTIBODY (ROUTINE TESTING W REFLEX): HIV: NONREACTIVE

## 2015-03-25 LAB — PSA: PSA: 1.57 ng/mL (ref <=4.00)

## 2015-03-25 LAB — VITAMIN D 25 HYDROXY (VIT D DEFICIENCY, FRACTURES): VIT D 25 HYDROXY: 20 ng/mL — AB (ref 30–100)

## 2015-03-28 MED ORDER — ERGOCALCIFEROL 1.25 MG (50000 UT) PO CAPS: 50000.0000 [IU] | ORAL_CAPSULE | ORAL | Status: DC

## 2015-03-29 ENCOUNTER — Ambulatory Visit (HOSPITAL_COMMUNITY): Payer: Self-pay | Admitting: Hematology & Oncology

## 2015-03-29 ENCOUNTER — Other Ambulatory Visit (HOSPITAL_COMMUNITY): Payer: BC Managed Care – PPO

## 2015-04-13 ENCOUNTER — Other Ambulatory Visit: Payer: Self-pay | Admitting: Family Medicine

## 2015-04-15 ENCOUNTER — Other Ambulatory Visit: Payer: Self-pay | Admitting: Family Medicine

## 2015-05-29 ENCOUNTER — Encounter: Payer: Self-pay | Admitting: Family Medicine

## 2015-05-29 NOTE — Assessment & Plan Note (Signed)
Start weekly vit d 

## 2015-05-29 NOTE — Assessment & Plan Note (Signed)
Moderately severe, however  Pt trying to quit nicotine and has reduced use significantly

## 2015-05-29 NOTE — Assessment & Plan Note (Signed)
Start low dose megace to help with appetite, and reduce beer intake

## 2015-05-30 ENCOUNTER — Telehealth: Payer: Self-pay

## 2015-05-30 MED ORDER — BUDESONIDE-FORMOTEROL FUMARATE 160-4.5 MCG/ACT IN AERO: INHALATION_SPRAY | RESPIRATORY_TRACT | Status: DC

## 2015-05-30 NOTE — Telephone Encounter (Signed)
Patient aware to contact pharmacy to see when would be the earliest he could fill the inhaler and that I have sent refills to the pharmacy

## 2015-05-31 ENCOUNTER — Other Ambulatory Visit: Payer: Self-pay

## 2015-05-31 ENCOUNTER — Telehealth: Payer: Self-pay

## 2015-05-31 MED ORDER — PREDNISONE 5 MG PO TABS: 5.0000 mg | ORAL_TABLET | Freq: Two times a day (BID) | ORAL | Status: DC

## 2015-05-31 NOTE — Telephone Encounter (Signed)
Pt aware.

## 2015-05-31 NOTE — Telephone Encounter (Signed)
pls send in pred 5 mg one twice daily for 5 days, and let him know

## 2015-06-23 ENCOUNTER — Ambulatory Visit (INDEPENDENT_AMBULATORY_CARE_PROVIDER_SITE_OTHER): Payer: BLUE CROSS/BLUE SHIELD | Admitting: Family Medicine

## 2015-06-23 ENCOUNTER — Encounter: Payer: Self-pay | Admitting: Family Medicine

## 2015-06-23 VITALS — BP 104/62 | HR 84 | Resp 18 | Ht 72.0 in | Wt 130.1 lb

## 2015-06-23 DIAGNOSIS — D72820 Lymphocytosis (symptomatic): Secondary | ICD-10-CM | POA: Diagnosis not present

## 2015-06-23 DIAGNOSIS — F1020 Alcohol dependence, uncomplicated: Secondary | ICD-10-CM | POA: Insufficient documentation

## 2015-06-23 DIAGNOSIS — F418 Other specified anxiety disorders: Secondary | ICD-10-CM

## 2015-06-23 DIAGNOSIS — F17218 Nicotine dependence, cigarettes, with other nicotine-induced disorders: Secondary | ICD-10-CM | POA: Diagnosis not present

## 2015-06-23 DIAGNOSIS — J449 Chronic obstructive pulmonary disease, unspecified: Secondary | ICD-10-CM | POA: Diagnosis not present

## 2015-06-23 DIAGNOSIS — E441 Mild protein-calorie malnutrition: Secondary | ICD-10-CM

## 2015-06-23 DIAGNOSIS — I10 Essential (primary) hypertension: Secondary | ICD-10-CM

## 2015-06-23 DIAGNOSIS — R636 Underweight: Secondary | ICD-10-CM

## 2015-06-23 DIAGNOSIS — F10288 Alcohol dependence with other alcohol-induced disorder: Secondary | ICD-10-CM | POA: Diagnosis not present

## 2015-06-23 DIAGNOSIS — I2510 Atherosclerotic heart disease of native coronary artery without angina pectoris: Secondary | ICD-10-CM

## 2015-06-23 NOTE — Assessment & Plan Note (Addendum)
Nephew present today with concern, pt has drank daily for at least 18 years, was alcohol free for 3 years and has been in rehab programs in the past Wavers as far as decision for rehab, will work on this however Reportedly drinks daily and more so on the weekend, which I do believe Nephew given info to follow through on getting him into an out pt rehab setting if he agrees to go

## 2015-06-23 NOTE — Patient Instructions (Signed)
F/u in 4 months, call if you need me before  Pls keep f/u with oncology, call for your appointment  You are referred to facility to help with your alcohol addiction, so that you can regain your life, this will help you to feel better, eat more healthily and enjoy life more without becoming irritable  Please also work on smoking cessation

## 2015-06-23 NOTE — Assessment & Plan Note (Signed)

## 2015-06-26 NOTE — Progress Notes (Signed)
[name]     MRN: [id number]      DOB: 1956/06/09   HPI [name] is here for follow up and re-evaluation of chronic medical conditions, medication management and review of any available recent lab and radiology data.  Preventive health is updated, specifically  Cancer screening and Immunization.   Needs to reschedule and keep appt with hematology,  And  cost of tests ordered. Nephew from [place] present primarily to  paint the true picture of alcohol addiction that pt has, seek help and he is also concerned about malnutrition ROS Denies recent fever or chills. Denies sinus pressure, nasal congestion, ear pain or sore throat. Denies chest congestion, productive cough , does have shortness of breath and  wheezing. Denies chest pains, palpitations and leg swelling Denies abdominal pain, nausea, vomiting,diarrhea or constipation.   Denies dysuria, frequency, hesitancy or incontinence. Denies joint pain, swelling and limitation in mobility. Denies headaches, seizures, numbness, or tingling.  Denies skin break down or rash.   PE  BP 104/62 mmHg  Pulse 84  Resp 18  Ht 6' (1.829 m)  Wt 130 lb 1.3 oz (59.004 kg)  BMI 17.64 kg/m2  SpO2 98%  Patient alert and oriented and in no cardiopulmonary distress. Malnourished HEENT: No facial asymmetry, EOMI,   oropharynx pink and moist.  Neck supple no JVD, no mass.  Chest: Scattered wheezes, decreased air entry bilaterally  CVS: S1, S2 no murmurs, no S3.Regular rate.  ABD: Soft non tender.   Ext: No edema  MS: Adequate ROM spine, shoulders, hips and knees.  Skin: Intact, no ulcerations or rash noted.  Psych: Fair eye contact, normal affect. Memory intact not anxious mildly  depressed appearing.  CNS: CN 2-12 intact, power,  normal throughout.no focal deficits noted.   Assessment & Plan   Nicotine dependence, cigarettes, with other nicotine-induced disorders Patient counseled for approximately 5 minutes regarding the  health risks of ongoing nicotine use, specifically all types of cancer, heart disease, stroke and respiratory failure. The options available for help with cessation ,the behavioral changes to assist the process, and the option to either gradully reduce usage  Or abruptly stop.is also discussed. Pt is also encouraged to set specific goals in number of cigarettes used daily, as well as to set a quit date.  Number of cigarettes/cigars currently smoking daily: 10  Not yet willing to quit   Alcohol addiction Nephew present today with concern, pt has drank daily for at least 18 years, was alcohol free for 3 years and has been in rehab programs in the past Wavers as far as decision for rehab, will work on this however Reportedly drinks daily and more so on the weekend, which I do believe Nephew given info to follow through on getting him into an out pt rehab setting if he agrees to go  Depression with anxiety An ongoing problem as long as alcohol addiction continues to destroy him physically and socially, not suicidal or homicidal, unclear as to med compliance as not brought to visit, nephew is to review and call back when he goes home  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, pt to call and reschedule his missed appt, cost of tests to be reviewed with ordering Doc by the nephew  Asthma with COPD Needs to start prevent MDI symbicort which is prescribed, not well controlled due to over use/ dependence on rescue albuterol  Essential hypertension Controlled, no change in medication   Coronary atherosclerosis Ongoing increased high risk due to nicotine use  Underweight Deteriorated, depends on alcohol for calories and non compliant with treatment plan, doubt taking megace prescribed, stressed importance of compliance   MALNUTRITION, MILD Deteriorated, pt needs to commit to improving his health by lifestyle change based on stopping alcohol addiction

## 2015-06-26 NOTE — Assessment & Plan Note (Signed)
Needs to start prevent MDI symbicort which is prescribed, not well controlled due to over use/ dependence on rescue albuterol

## 2015-06-26 NOTE — Assessment & Plan Note (Signed)
Followed by hematology, pt to call and reschedule his missed appt, cost of tests to be reviewed with ordering Doc by the nephew

## 2015-06-26 NOTE — Assessment & Plan Note (Signed)
Deteriorated, depends on alcohol for calories and non compliant with treatment plan, doubt taking megace prescribed, stressed importance of compliance

## 2015-06-26 NOTE — Assessment & Plan Note (Signed)
Ongoing increased high risk due to nicotine use

## 2015-06-26 NOTE — Assessment & Plan Note (Signed)
Deteriorated, pt needs to commit to improving his health by lifestyle change based on stopping alcohol addiction

## 2015-06-26 NOTE — Assessment & Plan Note (Signed)
An ongoing problem as long as alcohol addiction continues to destroy him physically and socially, not suicidal or homicidal, unclear as to med compliance as not brought to visit, nephew is to review and call back when he goes home

## 2015-06-26 NOTE — Assessment & Plan Note (Signed)
Controlled, no change in medication  

## 2015-07-04 ENCOUNTER — Other Ambulatory Visit: Payer: Self-pay | Admitting: Family Medicine

## 2015-07-11 ENCOUNTER — Other Ambulatory Visit: Payer: Self-pay | Admitting: Family Medicine

## 2015-07-13 ENCOUNTER — Ambulatory Visit: Payer: BLUE CROSS/BLUE SHIELD | Admitting: Family Medicine

## 2015-08-05 ENCOUNTER — Other Ambulatory Visit: Payer: Self-pay | Admitting: Family Medicine

## 2015-08-11 ENCOUNTER — Other Ambulatory Visit: Payer: Self-pay | Admitting: Family Medicine

## 2015-08-26 ENCOUNTER — Other Ambulatory Visit: Payer: Self-pay

## 2015-08-26 MED ORDER — BUDESONIDE-FORMOTEROL FUMARATE 160-4.5 MCG/ACT IN AERO: INHALATION_SPRAY | RESPIRATORY_TRACT | Status: DC

## 2015-09-05 ENCOUNTER — Other Ambulatory Visit: Payer: Self-pay | Admitting: Family Medicine

## 2015-09-21 ENCOUNTER — Ambulatory Visit (INDEPENDENT_AMBULATORY_CARE_PROVIDER_SITE_OTHER): Payer: BLUE CROSS/BLUE SHIELD | Admitting: Family Medicine

## 2015-09-21 ENCOUNTER — Telehealth: Payer: Self-pay

## 2015-09-21 ENCOUNTER — Encounter: Payer: Self-pay | Admitting: Family Medicine

## 2015-09-21 VITALS — BP 120/88 | HR 89 | Resp 16 | Ht 72.0 in | Wt 128.0 lb

## 2015-09-21 DIAGNOSIS — J449 Chronic obstructive pulmonary disease, unspecified: Secondary | ICD-10-CM

## 2015-09-21 DIAGNOSIS — Z0289 Encounter for other administrative examinations: Secondary | ICD-10-CM

## 2015-09-21 DIAGNOSIS — J441 Chronic obstructive pulmonary disease with (acute) exacerbation: Secondary | ICD-10-CM

## 2015-09-21 DIAGNOSIS — F10280 Alcohol dependence with alcohol-induced anxiety disorder: Secondary | ICD-10-CM | POA: Diagnosis not present

## 2015-09-21 DIAGNOSIS — I10 Essential (primary) hypertension: Secondary | ICD-10-CM

## 2015-09-21 DIAGNOSIS — F17218 Nicotine dependence, cigarettes, with other nicotine-induced disorders: Secondary | ICD-10-CM

## 2015-09-21 DIAGNOSIS — Z23 Encounter for immunization: Secondary | ICD-10-CM

## 2015-09-21 DIAGNOSIS — J4489 Other specified chronic obstructive pulmonary disease: Secondary | ICD-10-CM

## 2015-09-21 DIAGNOSIS — J45909 Unspecified asthma, uncomplicated: Secondary | ICD-10-CM

## 2015-09-21 DIAGNOSIS — F418 Other specified anxiety disorders: Secondary | ICD-10-CM

## 2015-09-21 MED ORDER — IPRATROPIUM-ALBUTEROL 0.5-2.5 (3) MG/3ML IN SOLN: 3.0000 mL | Freq: Four times a day (QID) | RESPIRATORY_TRACT | Status: DC | PRN

## 2015-09-21 MED ORDER — METHYLPREDNISOLONE ACETATE 80 MG/ML IJ SUSP
80.0000 mg | Freq: Once | INTRAMUSCULAR | Status: AC
  Administered 2015-09-21: 80 mg via INTRAMUSCULAR

## 2015-09-21 MED ORDER — PREDNISONE 5 MG (21) PO TBPK: 5.0000 mg | ORAL_TABLET | ORAL | Status: DC

## 2015-09-21 MED ORDER — IPRATROPIUM BROMIDE 0.02 % IN SOLN
0.5000 mg | Freq: Once | RESPIRATORY_TRACT | Status: AC
  Administered 2015-09-21: 0.5 mg via RESPIRATORY_TRACT

## 2015-09-21 MED ORDER — ALBUTEROL SULFATE (2.5 MG/3ML) 0.083% IN NEBU
2.5000 mg | INHALATION_SOLUTION | Freq: Once | RESPIRATORY_TRACT | Status: AC
  Administered 2015-09-21: 2.5 mg via RESPIRATORY_TRACT

## 2015-09-21 NOTE — Assessment & Plan Note (Signed)
Still drinks regularly, reports that he is drinking less and trying to stop, encouraged strongly again to seek help through AA and make a decision to iscontinue entirely as th habit is self destructive for him

## 2015-09-21 NOTE — Patient Instructions (Addendum)
F/u in 2 months, call if you need me before  Flu vaccine today  You have severe COPD, you need to stop smoking, now at 12 per day, no more than 6 per day in next 2 month Neb tereatment and depo medrol in office followed by prednisone take as directed  You need a breathing machine for in home use twice daily  Use of cigarrettes and alcohol tre harmful to your body, like all the rest of [place] and you NEED TO QUIT BOTH, to improve your health, and give yourself a chance of healthy living  Work excuse fron 10/03 to returhn 10/05 with no restrictions

## 2015-09-21 NOTE — Assessment & Plan Note (Signed)
Controlled, no change in medication  

## 2015-09-21 NOTE — Progress Notes (Signed)
Subjective:    Patient ID: [name], male    DOB: 12-18-1955, 59 y.o.   MRN: [id number]  HPI Pt states he was suspended in August for 3 days, , due to excessive  un excused absences. No contact had been made with this office re August situaation, and he was not taken out of work for illness by me  Today he walks in with above stated situation.Threasa Alpha he has been out of symbicort, which he believes helps him more than does albuterol , states he had only one of the 2 or 3 doses which he usually takes on Sunday 09/18/2015 I  explain this is twice daily only and for daily use. Was sent home he reports on 10/03 when he nearly collapsed due to inability to breathe , suspended and advised that he cannot return without medical clearance that he can work with no restrictions Sent off job due to severe breathing distress, unable to return without an  MD note. Currently smokes 12 cigarettes daily and is trying to quit , reports less alcohol use Denies fever or chills, denies productive cough Review of Systems See HPI Denies recent fever or chills. Denies sinus pressure, nasal congestion, ear pain or sore throat.  Denies chest pains, palpitations and leg swelling Denies abdominal pain, nausea, vomiting,diarrhea or constipation.   Denies dysuria, frequency, hesitancy or incontinence. Denies joint pain, swelling and limitation in mobility. Denies headaches, seizures, numbness, or tingling. Denies depression, anxiety or insomnia. Denies skin break down or rash.        Objective:   Physical Exam BP 120/88 mmHg  Pulse 89  Resp 16  Ht 6' (1.829 m)  Wt 128 lb (58.06 kg)  BMI 17.36 kg/m2  SpO2 94% Patient alert and oriented and in no cardiopulmonary distress.  HEENT: No facial asymmetry, EOMI,   oropharynx pink and moist.  Neck supple no JVD, no mass.  Chest: decreased air entry bilaterally though adequate , with wheezing , no crackles CVS: S1, S2 no murmurs, no S3.Regular  rate.  ABD: Soft non tender.   Ext: No edema  MS: Adequate ROM spine, shoulders, hips and knees.  Skin: Intact, no ulcerations or rash noted.  Psych: Good eye contact, normal affect. Memory intact not anxious or depressed appearing.  CNS: CN 2-12 intact, power,  normal throughout.no focal deficits noted.        Assessment & Plan:  COPD exacerbation (HCC) Acute flare, neb treatment and depo medrol in office followed by prednisone dose pack. Pt needs neb machine at home and to commit to twice dail neb treatments due to severe COPD Need to stop smoking emphasized , reports down to 12 per day and wanting to quit Work excuse from 10/03 to return 09/21/2015 unrestricted  Nicotine dependence, cigarettes, with other nicotine-induced disorders Patient counseled for approximately 5 minutes regarding the health risks of ongoing nicotine use, specifically all types of cancer, heart disease, stroke and respiratory failure. The options available for help with cessation ,the behavioral changes to assist the process, and the option to either gradully reduce usage  Or abruptly stop.is also discussed. Pt is also encouraged to set specific goals in number of cigarettes used daily, as well as to set a quit date.  Number of cigarettes/cigars currently smoking daily: 12   Alcohol addiction Still drinks regularly, reports that he is drinking less and trying to stop, encouraged strongly again to seek help through AA and make a decision to iscontinue entirely as th habit is  self destructive for him  Depression with anxiety Controlled, no change in medication   Need for prophylactic vaccination and inoculation against influenza After obtaining informed consent, the vaccine is  administered by LPN.   Essential hypertension Controlled, no change in medication DASH diet and commitment to daily physical activity for a minimum of 30 minutes discussed and encouraged, as a part of hypertension  management. The importance of attaining a healthy weight is also discussed.  BP/Weight 09/21/2015 06/23/2015 03/24/2015 11/10/2014 09/27/2014 06/22/2014 03/22/2014  Systolic BP 120 104 124 128 105 130 158  Diastolic BP 88 62 82 82 65 90 92  Wt. (Lbs) 128 130.08 131.75 136.12 140 133.12 141  BMI 17.36 17.64 17.86 18.34 18.98 19.1 19.12        Asthma with COPD Acute flare due to lack of maintainance medication, treatment in office and work excuse

## 2015-09-21 NOTE — Telephone Encounter (Signed)
Noted and patient scheduled to be seen

## 2015-09-21 NOTE — Assessment & Plan Note (Signed)

## 2015-09-21 NOTE — Assessment & Plan Note (Signed)
Controlled, no change in medication DASH diet and commitment to daily physical activity for a minimum of 30 minutes discussed and encouraged, as a part of hypertension management. The importance of attaining a healthy weight is also discussed.  BP/Weight 09/21/2015 06/23/2015 03/24/2015 11/10/2014 09/27/2014 06/22/2014 03/22/2014  Systolic BP 120 104 124 128 105 130 158  Diastolic BP 88 62 82 82 65 90 92  Wt. (Lbs) 128 130.08 131.75 136.12 140 133.12 141  BMI 17.36 17.64 17.86 18.34 18.98 19.1 19.12

## 2015-09-21 NOTE — Telephone Encounter (Signed)
States he was on the job Monday (couldn't afford his inhaler) and got so SOB he almost passed out. The work nurse made him leave the job and he was able to get his inhaler yesterday afternoon and now he feels better but they won't let him come back to work without a note for Mon and tues and saying he can return with no restrictions today. [place] Spaniel he is on a point system and is at risk for termination.  FMLA left to be completed also. Please advise

## 2015-09-21 NOTE — Assessment & Plan Note (Signed)
After obtaining informed consent, the vaccine is  administered by LPN.  

## 2015-09-21 NOTE — Assessment & Plan Note (Signed)
Acute flare, neb treatment and depo medrol in office followed by prednisone dose pack. Pt needs neb machine at home and to commit to twice dail neb treatments due to severe COPD Need to stop smoking emphasized , reports down to 12 per day and wanting to quit Work excuse from 10/03 to return 09/21/2015 unrestricted

## 2015-09-21 NOTE — Assessment & Plan Note (Signed)
Acute flare due to lack of maintainance medication, treatment in office and work excuse

## 2015-09-21 NOTE — Telephone Encounter (Signed)
I will see him today asap, he just needs to sit and wait

## 2015-10-15 ENCOUNTER — Other Ambulatory Visit: Payer: Self-pay | Admitting: Family Medicine

## 2015-10-24 ENCOUNTER — Other Ambulatory Visit: Payer: Self-pay

## 2015-10-24 MED ORDER — ALBUTEROL SULFATE HFA 108 (90 BASE) MCG/ACT IN AERS: INHALATION_SPRAY | RESPIRATORY_TRACT | Status: DC

## 2015-10-29 ENCOUNTER — Emergency Department (HOSPITAL_COMMUNITY)
Admission: EM | Admit: 2015-10-29 | Discharge: 2015-10-29 | Disposition: A | Discharge status: Home / Self Care | Payer: BLUE CROSS/BLUE SHIELD | Attending: Emergency Medicine | Admitting: Emergency Medicine

## 2015-10-29 ENCOUNTER — Encounter (HOSPITAL_COMMUNITY): Payer: Self-pay | Admitting: Emergency Medicine

## 2015-10-29 ENCOUNTER — Emergency Department (HOSPITAL_COMMUNITY): Payer: BLUE CROSS/BLUE SHIELD

## 2015-10-29 DIAGNOSIS — J069 Acute upper respiratory infection, unspecified: Secondary | ICD-10-CM | POA: Insufficient documentation

## 2015-10-29 DIAGNOSIS — Z7982 Long term (current) use of aspirin: Secondary | ICD-10-CM | POA: Diagnosis not present

## 2015-10-29 DIAGNOSIS — Z720 Tobacco use: Secondary | ICD-10-CM | POA: Insufficient documentation

## 2015-10-29 DIAGNOSIS — I2510 Atherosclerotic heart disease of native coronary artery without angina pectoris: Secondary | ICD-10-CM | POA: Insufficient documentation

## 2015-10-29 DIAGNOSIS — Z862 Personal history of diseases of the blood and blood-forming organs and certain disorders involving the immune mechanism: Secondary | ICD-10-CM | POA: Insufficient documentation

## 2015-10-29 DIAGNOSIS — Z79899 Other long term (current) drug therapy: Secondary | ICD-10-CM | POA: Diagnosis not present

## 2015-10-29 DIAGNOSIS — Z7951 Long term (current) use of inhaled steroids: Secondary | ICD-10-CM | POA: Diagnosis not present

## 2015-10-29 DIAGNOSIS — J441 Chronic obstructive pulmonary disease with (acute) exacerbation: Secondary | ICD-10-CM | POA: Diagnosis not present

## 2015-10-29 DIAGNOSIS — I10 Essential (primary) hypertension: Secondary | ICD-10-CM | POA: Diagnosis not present

## 2015-10-29 DIAGNOSIS — R05 Cough: Secondary | ICD-10-CM | POA: Diagnosis present

## 2015-10-29 MED ORDER — DOXYCYCLINE HYCLATE 100 MG PO CAPS: 100.0000 mg | ORAL_CAPSULE | Freq: Two times a day (BID) | ORAL | Status: DC

## 2015-10-29 MED ORDER — HYDROCOD POLST-CPM POLST ER 10-8 MG/5ML PO SUER
5.0000 mL | Freq: Once | ORAL | Status: AC
  Administered 2015-10-29: 5 mL via ORAL
  Filled 2015-10-29: qty 5

## 2015-10-29 MED ORDER — HYDROCODONE-HOMATROPINE 5-1.5 MG/5ML PO SYRP: 5.0000 mL | ORAL_SOLUTION | Freq: Four times a day (QID) | ORAL | Status: DC | PRN

## 2015-10-29 MED ORDER — ALBUTEROL SULFATE (2.5 MG/3ML) 0.083% IN NEBU
2.5000 mg | INHALATION_SOLUTION | Freq: Once | RESPIRATORY_TRACT | Status: AC
Start: 2015-10-29 — End: 2015-10-29
  Administered 2015-10-29: 2.5 mg via RESPIRATORY_TRACT
  Filled 2015-10-29: qty 3

## 2015-10-29 MED ORDER — DOXYCYCLINE HYCLATE 100 MG PO TABS
100.0000 mg | ORAL_TABLET | Freq: Once | ORAL | Status: AC
  Administered 2015-10-29: 100 mg via ORAL
  Filled 2015-10-29: qty 1

## 2015-10-29 MED ORDER — PREDNISONE 50 MG PO TABS
60.0000 mg | ORAL_TABLET | Freq: Once | ORAL | Status: AC
  Administered 2015-10-29: 60 mg via ORAL
  Filled 2015-10-29 (×2): qty 1

## 2015-10-29 NOTE — ED Notes (Signed)
Cough for last three days, dark brown thick secretion.  Sore throat, rates pain 4/10.

## 2015-10-29 NOTE — Discharge Instructions (Signed)
Please continue your albuterol continue her prednisone. Please add doxycycline 2 times daily with food. Use Vicodin every 6 hours as needed for cough. Hycodan may cause drowsiness, please use this medication with caution. Please take these prescriptions with you to Dr. Lodema Hong , Chronic Obstructive Pulmonary Disease Exacerbation Chronic obstructive pulmonary disease (COPD) is a common lung condition in which airflow from the lungs is limited. COPD is a general term that can be used to describe many different lung problems that limit airflow, including chronic bronchitis and emphysema. COPD exacerbations are episodes when breathing symptoms become much worse and require extra treatment. Without treatment, COPD exacerbations can be life threatening, and frequent COPD exacerbations can cause further damage to your lungs. CAUSES  Respiratory infections.  Exposure to smoke.  Exposure to air pollution, chemical fumes, or dust. Sometimes there is no apparent cause or trigger. RISK FACTORS  Smoking cigarettes.  Older age.  Frequent prior COPD exacerbations. SIGNS AND SYMPTOMS  Increased coughing.  Increased thick spit (sputum) production.  Increased wheezing.  Increased shortness of breath.  Rapid breathing.  Chest tightness. DIAGNOSIS Your medical history, a physical exam, and tests will help your health care provider make a diagnosis. Tests may include:  A chest X-ray.  Basic lab tests.  Sputum testing.  An arterial blood gas test. TREATMENT Depending on the severity of your COPD exacerbation, you may need to be admitted to a hospital for treatment. Some of the treatments commonly used to treat COPD exacerbations are:   Antibiotic medicines.  Bronchodilators. These are drugs that expand the air passages. They may be given with an inhaler or nebulizer. Spacer devices may be needed to help improve drug delivery.  Corticosteroid medicines.  Supplemental oxygen  therapy.  Airway clearing techniques, such as noninvasive ventilation (NIV) and positive expiratory pressure (PEP). These provide respiratory support through a mask or other noninvasive device. HOME CARE INSTRUCTIONS  Do not smoke. Quitting smoking is very important to prevent COPD from getting worse and exacerbations from happening as often.  Avoid exposure to all substances that irritate the airway, especially to tobacco smoke.  If you were prescribed an antibiotic medicine, finish it all even if you start to feel better.  Take all medicines as directed by your health care provider.It is important to use correct technique with inhaled medicines.  Drink enough fluids to keep your urine clear or pale yellow (unless you have a medical condition that requires fluid restriction).  Use a cool mist vaporizer. This makes it easier to clear your chest when you cough.  If you have a home nebulizer and oxygen, continue to use them as directed.  Maintain all necessary vaccinations to prevent infections.  Exercise regularly.  Eat a healthy diet.  Keep all follow-up appointments as directed by your health care provider. SEEK IMMEDIATE MEDICAL CARE IF:  You have worsening shortness of breath.  You have trouble talking.  You have severe chest pain.  You have blood in your sputum.  You have a fever.  You have weakness, vomit repeatedly, or faint.  You feel confused.  You continue to get worse. MAKE SURE YOU:  Understand these instructions.  Will watch your condition.  Will get help right away if you are not doing well or get worse.   This information is not intended to replace advice given to you by your health care provider. Make sure you discuss any questions you have with your health care provider.   Document Released: 09/30/2007 Document Revised: 12/24/2014 Document  Reviewed: 08/07/2013 Elsevier Interactive Patient Education [employer]  she may make them apart of  your medical record.

## 2015-10-29 NOTE — ED Notes (Signed)
Discharge instructions given to pt , discussed use of ABT's and also how cough med can make him sleepy- No other questions. Pt ambulated off unit with his mother

## 2015-10-29 NOTE — ED Provider Notes (Signed)
CSN: [id number]     Arrival date & time 10/29/15  1727 History   First MD Initiated Contact with Patient 10/29/15 1816     Chief Complaint  Patient presents with  . Cough  . Sore Throat     (Consider location/radiation/quality/duration/timing/severity/associated sxs/prior Treatment) Patient is a 59 y.o. male presenting with cough and pharyngitis. The history is provided by the patient.  Cough Cough characteristics:  Productive Sputum characteristics:  Manson PasseyBrown Severity:  Moderate Onset quality:  Gradual Duration:  3 days Timing:  Intermittent Progression:  Worsening Chronicity:  New Smoker: yes   Context: sick contacts and weather changes   Relieved by:  Nothing Ineffective treatments:  Steroid inhaler and home nebulizer Associated symptoms: chills, headaches, shortness of breath and wheezing   Associated symptoms: no fever and no rash   Risk factors: no recent travel   Sore Throat Associated symptoms include chills, coughing and headaches. Pertinent negatives include no fever or rash.    Past Medical History  Diagnosis Date  . COPD (chronic obstructive pulmonary disease) (HCC)   . Essential hypertension, benign   . Nicotine addiction   . Alcohol abuse   . Asthma   . History of neutropenia     Also lymphocytosis - followed by Dr. Zigmund DanielFormanek  . Coronary atherosclerosis     Based on chest CT 2012   Past Surgical History  Procedure Laterality Date  . Multiple tooth extractions  June 2012   Family History  Problem Relation Age of Onset  . Diabetes Mother   . Hypertension Mother   . Diabetes Father   . Hypertension Sister    Social History  Substance Use Topics  . Smoking status: Current Every Day Smoker -- 0.50 packs/day for .5 years    Types: Cigarettes  . Smokeless tobacco: Never Used     Comment: quit for 6 weeks   . Alcohol Use: 10.8 oz/week    18 Cans of beer per week     Comment: "drinks beer every once in awhile"    Review of Systems  Constitutional:  Positive for chills. Negative for fever.  Respiratory: Positive for cough, shortness of breath and wheezing.   Skin: Negative for rash.  Neurological: Positive for tremors and headaches.  All other systems reviewed and are negative.     Allergies  Lisinopril  Home Medications   Prior to Admission medications   Medication Sig Start Date End Date Taking? Authorizing Provider  albuterol (PROAIR HFA) 108 (90 BASE) MCG/ACT inhaler INHALE 2 PUFFS INTO THE LUNGS EVERY 6 HOURS AS NEEDED FOR WHEEZING. 10/24/15   Kerri PerchesMargaret E Simpson, MD  amLODipine (NORVASC) 10 MG tablet TAKE ONE TABLET BY MOUTH DAILY. 10/17/15   Kerri PerchesMargaret E Simpson, MD  aspirin 81 MG EC tablet Take 162 mg by mouth daily.     Historical Provider, MD  budesonide-formoterol (SYMBICORT) 160-4.5 MCG/ACT inhaler INHALE 2 PUFFS BY MOUTH TWICE DAILY. 08/26/15   Kerri PerchesMargaret E Simpson, MD  citalopram (CELEXA) 20 MG tablet TAKE ONE TABLET BY MOUTH ONCE DAILY. 07/11/15   Kerri PerchesMargaret E Simpson, MD  ergocalciferol (VITAMIN D2) 50000 UNITS capsule Take 1 capsule (50,000 Units total) by mouth once a week. One capsule once weekly 03/28/15   Kerri PerchesMargaret E Simpson, MD  ipratropium-albuterol (DUONEB) 0.5-2.5 (3) MG/3ML SOLN Take 3 mLs by nebulization every 6 (six) hours as needed. 09/21/15   Kerri PerchesMargaret E Simpson, MD  megestrol (MEGACE) 20 MG tablet TAKE ONE TABLET BY MOUTH DAILY. 09/07/15   Kerri PerchesMargaret E Simpson, MD  mirtazapine (REMERON SOL-TAB) 15 MG disintegrating tablet DISSOLVE ONE TABLET ON OR UNDERNEATH THE TONGUE AT BEDTIME. 08/05/15   Kerri Perches, MD  predniSONE (STERAPRED UNI-PAK 21 TAB) 5 MG (21) TBPK tablet Take 1 tablet (5 mg total) by mouth as directed. Use as directed 09/21/15   Kerri Perches, MD  triamterene-hydrochlorothiazide (MAXZIDE) 75-50 MG per tablet TAKE ONE TABLET BY MOUTH DAILY. 07/11/15   Kerri Perches, MD   BP 120/82 mmHg  Pulse 75  Temp(Src) 98.5 F (36.9 C) (Oral)  Resp 22  Ht  (1.676 m)  Wt 143 lb (64.864 kg)  BMI  23.09 kg/m2  SpO2 97% Physical Exam  Constitutional: He is oriented to person, place, and time. He appears well-developed and well-nourished.  Non-toxic appearance.  HENT:  Head: Normocephalic.  Right Ear: Tympanic membrane and external ear normal.  Left Ear: Tympanic membrane and external ear normal.  Eyes: EOM and lids are normal. Pupils are equal, round, and reactive to light.  Neck: Normal range of motion. Neck supple. Carotid bruit is not present.  Cardiovascular: Normal rate, regular rhythm, normal heart sounds, intact distal pulses and normal pulses.   Pulmonary/Chest: Tachypnea noted. No respiratory distress. He has wheezes. He has rhonchi.  Tachypnea at 32/min  Abdominal: Soft. Bowel sounds are normal. There is no tenderness. There is no guarding.  Musculoskeletal: Normal range of motion.  No edema. Neg Homan's sign.  Lymphadenopathy:       Head (right side): No submandibular adenopathy present.       Head (left side): No submandibular adenopathy present.    He has no cervical adenopathy.  Neurological: He is alert and oriented to person, place, and time. He has normal strength. No cranial nerve deficit or sensory deficit.  Skin: Skin is warm and dry.  Psychiatric: He has a normal mood and affect. His speech is normal.  Nursing note and vitals reviewed.   ED Course  Procedures (including critical care time) Labs Review Labs Reviewed - No data to display  Imaging Review No results found. I have personally reviewed and evaluated these images and lab results as part of my medical decision-making.   EKG Interpretation None      MDM Patient breathing much easier after albuterol and cough medication. Respiratory rate significantly improved from 32/m, to 24/m. Patient states he feels better. He speaks in complete sentences without problem. The patient will continue his albuterol and his steroids. We will add doxycycline and Hycodan for cough. Patient is to follow-up with  Dr. Lodema Hong next week.    Final diagnoses:  None    **I have reviewed nursing notes, vital signs, and all appropriate lab and imaging results for this patient.Ivery Quale, PA-C 10/29/15 1959  Lavera Guise, MD 10/30/15 [phone]

## 2015-11-01 ENCOUNTER — Ambulatory Visit (INDEPENDENT_AMBULATORY_CARE_PROVIDER_SITE_OTHER): Payer: BLUE CROSS/BLUE SHIELD | Admitting: Family Medicine

## 2015-11-01 ENCOUNTER — Ambulatory Visit: Payer: BLUE CROSS/BLUE SHIELD | Admitting: Family Medicine

## 2015-11-01 ENCOUNTER — Encounter: Payer: Self-pay | Admitting: Family Medicine

## 2015-11-01 VITALS — BP 118/76 | HR 90 | Resp 16 | Ht 66.0 in | Wt 134.4 lb

## 2015-11-01 DIAGNOSIS — E559 Vitamin D deficiency, unspecified: Secondary | ICD-10-CM

## 2015-11-01 DIAGNOSIS — F17218 Nicotine dependence, cigarettes, with other nicotine-induced disorders: Secondary | ICD-10-CM | POA: Diagnosis not present

## 2015-11-01 DIAGNOSIS — R05 Cough: Secondary | ICD-10-CM | POA: Diagnosis not present

## 2015-11-01 DIAGNOSIS — I10 Essential (primary) hypertension: Secondary | ICD-10-CM | POA: Diagnosis not present

## 2015-11-01 DIAGNOSIS — R059 Cough, unspecified: Secondary | ICD-10-CM | POA: Insufficient documentation

## 2015-11-01 DIAGNOSIS — J432 Centrilobular emphysema: Secondary | ICD-10-CM | POA: Diagnosis not present

## 2015-11-01 DIAGNOSIS — Z09 Encounter for follow-up examination after completed treatment for conditions other than malignant neoplasm: Secondary | ICD-10-CM

## 2015-11-01 DIAGNOSIS — F418 Other specified anxiety disorders: Secondary | ICD-10-CM

## 2015-11-01 DIAGNOSIS — F17200 Nicotine dependence, unspecified, uncomplicated: Secondary | ICD-10-CM | POA: Insufficient documentation

## 2015-11-01 DIAGNOSIS — F1024 Alcohol dependence with alcohol-induced mood disorder: Secondary | ICD-10-CM

## 2015-11-01 MED ORDER — ERGOCALCIFEROL 1.25 MG (50000 UT) PO CAPS: 50000.0000 [IU] | ORAL_CAPSULE | ORAL | Status: DC

## 2015-11-01 MED ORDER — AMLODIPINE BESYLATE 10 MG PO TABS: 10.0000 mg | ORAL_TABLET | Freq: Every day | ORAL | Status: DC

## 2015-11-01 NOTE — Assessment & Plan Note (Signed)
Worsened due tio ongoing nicotine use, pt intends to quit by year end

## 2015-11-01 NOTE — Assessment & Plan Note (Signed)
Needs to commit to weekly vit D supplement 

## 2015-11-01 NOTE — Assessment & Plan Note (Signed)

## 2015-11-01 NOTE — Assessment & Plan Note (Signed)
Treated in ED this past weekend for acute bronchitis and uncontrolled COPD with flare. Reports marked improvement with antibiotic and cough suppressant also reports plan to quit

## 2015-11-01 NOTE — Patient Instructions (Addendum)
F/u 2 month, call iof you need me before  Thankful that you are now resolved to QUITTING smoking on 12/17/2015  Complete the antibiotic as prescribed  You are referred for chest scan due to long smoking history  Please also commit to reducing the alcohol  Start once weekly vitamin D , as your vitamin D is low  CBC , chem 7 in 2 month  All the best for 2017.  [facility], we consider it a privelige to serve you.

## 2015-11-01 NOTE — Assessment & Plan Note (Signed)
Improved , continue current medication 

## 2015-11-01 NOTE — Assessment & Plan Note (Signed)
Pt reminded of need to reduce alcohol use while he is working on the cigarettes, he agrees

## 2015-11-01 NOTE — Assessment & Plan Note (Signed)
Controlled, no change in medication  

## 2015-11-01 NOTE — Progress Notes (Signed)
Subjective:    Patient ID: [name], male    DOB: [birth year], 59 y.o.   MRN: [id number]  HPI    [name]     MRN: [id number]      DOB: [birth year]   HPI [name] is here for follow up from recent ED visit, when he was diagnosed with and treated for acute bronchitis with COPD flare. States he had to leave work for the ED, as he was unable to breathe. States down to 6 cigarettes daily and intends to have quit by his  Birthday.  Notes and radiology data from ED are reviewed.  Preventive health is updated, specifically  Cancer screening and Immunization.   Questions or concerns regarding consultations or procedures which the PT has had in the interim are  addressed. The PT denies any adverse reactions to current medications since the last visit.States hycodan is "strong medication" and is stopping his cough  C/o coughing up brown/ green sputum which he had never seen in the past, became concerned that it may be cancer   ROS Denies recent fever or chills. Denies sinus pressure, nasal congestion, ear pain or sore throat.  Denies chest pains, palpitations and leg swelling Denies abdominal pain, nausea, vomiting,diarrhea or constipation.   Denies dysuria, frequency, hesitancy or incontinence. Denies joint pain, swelling and limitation in mobility. Denies headaches, seizures, numbness, or tingling. Denies depression, uncontrolled  anxiety or insomnia. Denies skin break down or rash.   PE  BP 118/76 mmHg  Pulse 90  Resp 16  Ht 5\' 6"  (1.676 m)  Wt 134 lb 6.4 oz (60.963 kg)  BMI 21.70 kg/m2  SpO2 95%  Patient alert and oriented and in no cardiopulmonary distress.  HEENT: No facial asymmetry, EOMI,   oropharynx pink and moist.  Neck supple no JVD, no mass.  Chest: decreased air entry, few wheezes, no crackles  CVS: S1, S2 no murmurs, no S3.Regular rate.  ABD: Soft non tender.   Ext: No edema  MS: Adequate ROM spine, shoulders, hips and knees.  Skin:  Intact, no ulcerations or rash noted.  Psych: Good eye contact, normal affect. Memory intact not anxious or depressed appearing.  CNS: CN 2-12 intact, power,  normal throughout.no focal deficits noted.   Assessment & Plan   Nicotine dependence, cigarettes, with other nicotine-induced disorders Patient counseled for approximately 5 minutes regarding the health risks of ongoing nicotine use, specifically all types of cancer, heart disease, stroke and respiratory failure. The options available for help with cessation ,the behavioral changes to assist the process, and the option to either gradully reduce usage  Or abruptly stop.is also discussed. Pt is also encouraged to set specific goals in number of cigarettes used daily, as well as to set a quit date.  Number of cigarettes/cigars currently smoking daily:5 Over 30 pack year hisotory   Centrilobular emphysema (HCC) Worsened due tio ongoing nicotine use, pt intends to quit by year end  Essential hypertension Controlled, no change in medication   Depression with anxiety Improved, continue current medication  Vitamin D deficiency Needs to commit to weekly vit D supplement  Alcohol addiction Pt reminded of need to reduce alcohol use while he is working on the cigarettes, he agrees  Hospital discharge follow-up Treated in ED this past weekend for acute bronchitis and uncontrolled COPD with flare. Reports marked improvement with antibiotic and cough suppressant also reports plan to quit       Review of Systems     Objective:  Physical Exam        Assessment & Plan:

## 2015-11-04 ENCOUNTER — Ambulatory Visit (HOSPITAL_COMMUNITY)
Admission: RE | Admit: 2015-11-04 | Discharge: 2015-11-04 | Disposition: A | Discharge status: Home / Self Care | Payer: BLUE CROSS/BLUE SHIELD | Source: Ambulatory Visit | Attending: Family Medicine | Admitting: Family Medicine

## 2015-11-04 DIAGNOSIS — Z87891 Personal history of nicotine dependence: Secondary | ICD-10-CM | POA: Insufficient documentation

## 2015-11-04 DIAGNOSIS — Z122 Encounter for screening for malignant neoplasm of respiratory organs: Secondary | ICD-10-CM | POA: Insufficient documentation

## 2015-11-04 DIAGNOSIS — F17218 Nicotine dependence, cigarettes, with other nicotine-induced disorders: Secondary | ICD-10-CM

## 2015-12-05 ENCOUNTER — Ambulatory Visit: Payer: BLUE CROSS/BLUE SHIELD | Admitting: Family Medicine

## 2015-12-28 ENCOUNTER — Other Ambulatory Visit: Payer: Self-pay | Admitting: Family Medicine

## 2015-12-28 LAB — CBC WITH DIFFERENTIAL/PLATELET
BASOS ABS: 0.1 10*3/uL (ref 0.0–0.1)
Basophils Relative: 1 % (ref 0–1)
EOS PCT: 8 % — AB (ref 0–5)
Eosinophils Absolute: 0.5 10*3/uL (ref 0.0–0.7)
HEMATOCRIT: 41.5 % (ref 39.0–52.0)
HEMOGLOBIN: 14 g/dL (ref 13.0–17.0)
LYMPHS ABS: 2.6 10*3/uL (ref 0.7–4.0)
LYMPHS PCT: 40 % (ref 12–46)
MCH: 28.7 pg (ref 26.0–34.0)
MCHC: 33.7 g/dL (ref 30.0–36.0)
MCV: 85.2 fL (ref 78.0–100.0)
MPV: 9.3 fL (ref 8.6–12.4)
Monocytes Absolute: 1 10*3/uL (ref 0.1–1.0)
Monocytes Relative: 16 % — ABNORMAL HIGH (ref 3–12)
NEUTROS ABS: 2.2 10*3/uL (ref 1.7–7.7)
Neutrophils Relative %: 35 % — ABNORMAL LOW (ref 43–77)
Platelets: 315 10*3/uL (ref 150–400)
RBC: 4.87 MIL/uL (ref 4.22–5.81)
RDW: 12.8 % (ref 11.5–15.5)
WBC: 6.4 10*3/uL (ref 4.0–10.5)

## 2015-12-29 ENCOUNTER — Ambulatory Visit: Payer: BLUE CROSS/BLUE SHIELD | Admitting: Family Medicine

## 2015-12-29 ENCOUNTER — Encounter: Payer: Self-pay | Admitting: Family Medicine

## 2015-12-29 ENCOUNTER — Ambulatory Visit (INDEPENDENT_AMBULATORY_CARE_PROVIDER_SITE_OTHER): Payer: BLUE CROSS/BLUE SHIELD | Admitting: Family Medicine

## 2015-12-29 VITALS — BP 112/74 | HR 97 | Resp 16 | Ht 66.0 in | Wt 135.0 lb

## 2015-12-29 DIAGNOSIS — J449 Chronic obstructive pulmonary disease, unspecified: Secondary | ICD-10-CM

## 2015-12-29 DIAGNOSIS — F1024 Alcohol dependence with alcohol-induced mood disorder: Secondary | ICD-10-CM | POA: Diagnosis not present

## 2015-12-29 DIAGNOSIS — J45909 Unspecified asthma, uncomplicated: Secondary | ICD-10-CM

## 2015-12-29 DIAGNOSIS — E559 Vitamin D deficiency, unspecified: Secondary | ICD-10-CM

## 2015-12-29 DIAGNOSIS — I10 Essential (primary) hypertension: Secondary | ICD-10-CM

## 2015-12-29 DIAGNOSIS — R636 Underweight: Secondary | ICD-10-CM

## 2015-12-29 DIAGNOSIS — F418 Other specified anxiety disorders: Secondary | ICD-10-CM | POA: Diagnosis not present

## 2015-12-29 LAB — BASIC METABOLIC PANEL
BUN: 13 mg/dL (ref 7–25)
CHLORIDE: 93 mmol/L — AB (ref 98–110)
CO2: 29 mmol/L (ref 20–31)
CREATININE: 0.99 mg/dL (ref 0.70–1.33)
Calcium: 9.7 mg/dL (ref 8.6–10.3)
GLUCOSE: 84 mg/dL (ref 65–99)
Potassium: 3.6 mmol/L (ref 3.5–5.3)
Sodium: 134 mmol/L — ABNORMAL LOW (ref 135–146)

## 2015-12-29 LAB — HEPATITIS C ANTIBODY: HCV Ab: NEGATIVE

## 2015-12-29 MED ORDER — PROMETHAZINE-DM 6.25-15 MG/5ML PO SYRP: ORAL_SOLUTION | ORAL | Status: DC

## 2015-12-29 NOTE — Patient Instructions (Addendum)
F/u in 3 month, call if you need me before  Congrats, with stopping smoking  Continue to NOT DRINK in the week, , maximum of 4 cans on the weekend, and cut back to three please  Cough syrup sent  Labs are good

## 2015-12-29 NOTE — Progress Notes (Signed)
   Subjective:    Patient ID: Noel Geroldrnest D Tom, male    DOB: 1956-01-22, 60 y.o.   MRN: [id number]  HPI   Noel Geroldrnest D Perrot     MRN: [id number]      DOB: 1956-01-22   HPI [name] is here for follow up and re-evaluation of chronic medical conditions, medication management and review of any available recent lab and radiology data.  Preventive health is updated, specifically  Cancer screening and Immunization.   Questions or concerns regarding consultations or procedures which the PT has had in the interim are  addressed. The PT denies any adverse reactions to current medications since the last visit.  There are no new concerns.  There are no specific complaints   ROS Denies recent fever or chills. Denies sinus pressure, nasal congestion, ear pain or sore throat. Denies chest congestion, productive cough or wheezing. Denies chest pains, palpitations and leg swelling Denies abdominal pain, nausea, vomiting,diarrhea or constipation.   Denies dysuria, frequency, hesitancy or incontinence. Denies joint pain, swelling and limitation in mobility. Denies headaches, seizures, numbness, or tingling. Denies depression, anxiety or insomnia. Denies skin break down or rash.   PE  BP 112/74 mmHg  Pulse 97  Resp 16  Ht 5\' 6"  (1.676 m)  Wt 135 lb (61.236 kg)  BMI 21.80 kg/m2  SpO2 97%  Patient alert and oriented and in no cardiopulmonary distress.  HEENT: No facial asymmetry, EOMI,   oropharynx pink and moist.  Neck supple no JVD, no mass.  Chest: Clear to auscultation bilaterally.Decreased air entry throughout  CVS: S1, S2 no murmurs, no S3.Regular rate.  ABD: Soft non tender.   Ext: No edema  MS: Adequate ROM spine, shoulders, hips and knees.  Skin: Intact, no ulcerations or rash noted.  Psych: Good eye contact, normal affect. Memory intact not anxious or depressed appearing.  CNS: CN 2-12 intact, power,  normal throughout.no focal deficits noted.   Assessment &  Plan   Asthma with COPD Improved with smoking cessation  Essential hypertension Controlled, no change in medication DASH diet and commitment to daily physical activity for a minimum of 30 minutes discussed and encouraged, as a part of hypertension management. The importance of attaining a healthy weight is also discussed.  BP/Weight 12/29/2015 11/01/2015 10/29/2015 09/21/2015 06/23/2015 03/24/2015 11/10/2014  Systolic BP 112 118 110 120 104 124 128  Diastolic BP 74 76 77 88 62 82 82  Wt. (Lbs) 135 134.4 143 128 130.08 131.75 136.12  BMI 21.8 21.7 23.09 17.36 17.64 17.86 18.34        Alcohol addiction Reports cutting back restricts drinking to weekends only currently, needs ot reduce from 4 cans per night to 3, needs to quit  Depression with anxiety Controlled, no change in medication Not suicidal or homicidal. Less anxiety and tremor noted Reports improved attitude to work and better perfoirmance  Underweight Continue daily megace , with reduced alcohol and nicotine cessation he should continue to gain weight and is encouraged to do so  Vitamin D deficiency Updated lab needed at/ before next visit. continue weekly supplement      Review of Systems     Objective:   Physical Exam        Assessment & Plan:

## 2015-12-31 NOTE — Assessment & Plan Note (Signed)
Reports cutting back restricts drinking to weekends only currently, needs ot reduce from 4 cans per night to 3, needs to quit

## 2015-12-31 NOTE — Assessment & Plan Note (Signed)
Controlled, no change in medication DASH diet and commitment to daily physical activity for a minimum of 30 minutes discussed and encouraged, as a part of hypertension management. The importance of attaining a healthy weight is also discussed.  BP/Weight 12/29/2015 11/01/2015 10/29/2015 09/21/2015 06/23/2015 03/24/2015 11/10/2014  Systolic BP 112 118 110 120 104 124 128  Diastolic BP 74 76 77 88 62 82 82  Wt. (Lbs) 135 134.4 143 128 130.08 131.75 136.12  BMI 21.8 21.7 23.09 17.36 17.64 17.86 18.34

## 2015-12-31 NOTE — Assessment & Plan Note (Signed)
Continue daily megace , with reduced alcohol and nicotine cessation he should continue to gain weight and is encouraged to do so

## 2015-12-31 NOTE — Assessment & Plan Note (Signed)
Improved with smoking cessation. 

## 2015-12-31 NOTE — Assessment & Plan Note (Signed)
Controlled, no change in medication Not suicidal or homicidal. Less anxiety and tremor noted Reports improved attitude to work and better [employer]foirmance

## 2015-12-31 NOTE — Assessment & Plan Note (Signed)
Updated lab needed at/ before next visit. continue weekly supplement

## 2016-01-23 ENCOUNTER — Other Ambulatory Visit: Payer: Self-pay | Admitting: Family Medicine

## 2016-01-30 ENCOUNTER — Other Ambulatory Visit: Payer: Self-pay | Admitting: Family Medicine

## 2016-02-21 ENCOUNTER — Observation Stay (HOSPITAL_COMMUNITY)
Admission: EM | Admit: 2016-02-21 | Discharge: 2016-02-24 | Disposition: A | Discharge status: Home / Self Care | Payer: BLUE CROSS/BLUE SHIELD | Attending: Internal Medicine | Admitting: Internal Medicine

## 2016-02-21 ENCOUNTER — Emergency Department (HOSPITAL_COMMUNITY): Payer: BLUE CROSS/BLUE SHIELD

## 2016-02-21 ENCOUNTER — Encounter (HOSPITAL_COMMUNITY): Payer: Self-pay | Admitting: Emergency Medicine

## 2016-02-21 DIAGNOSIS — F17210 Nicotine dependence, cigarettes, uncomplicated: Secondary | ICD-10-CM | POA: Diagnosis not present

## 2016-02-21 DIAGNOSIS — E860 Dehydration: Secondary | ICD-10-CM

## 2016-02-21 DIAGNOSIS — J441 Chronic obstructive pulmonary disease with (acute) exacerbation: Secondary | ICD-10-CM | POA: Diagnosis not present

## 2016-02-21 DIAGNOSIS — F17218 Nicotine dependence, cigarettes, with other nicotine-induced disorders: Secondary | ICD-10-CM | POA: Diagnosis present

## 2016-02-21 DIAGNOSIS — R0602 Shortness of breath: Secondary | ICD-10-CM | POA: Diagnosis present

## 2016-02-21 DIAGNOSIS — F1010 Alcohol abuse, uncomplicated: Secondary | ICD-10-CM | POA: Diagnosis not present

## 2016-02-21 DIAGNOSIS — J449 Chronic obstructive pulmonary disease, unspecified: Secondary | ICD-10-CM | POA: Diagnosis not present

## 2016-02-21 DIAGNOSIS — Z79899 Other long term (current) drug therapy: Secondary | ICD-10-CM | POA: Insufficient documentation

## 2016-02-21 DIAGNOSIS — I10 Essential (primary) hypertension: Secondary | ICD-10-CM | POA: Diagnosis not present

## 2016-02-21 DIAGNOSIS — E871 Hypo-osmolality and hyponatremia: Secondary | ICD-10-CM | POA: Diagnosis present

## 2016-02-21 DIAGNOSIS — F418 Other specified anxiety disorders: Secondary | ICD-10-CM | POA: Diagnosis not present

## 2016-02-21 DIAGNOSIS — J45909 Unspecified asthma, uncomplicated: Secondary | ICD-10-CM | POA: Diagnosis not present

## 2016-02-21 DIAGNOSIS — R636 Underweight: Secondary | ICD-10-CM | POA: Diagnosis present

## 2016-02-21 LAB — COMPREHENSIVE METABOLIC PANEL
ALBUMIN: 4.1 g/dL (ref 3.5–5.0)
ALT: 23 U/L (ref 17–63)
ANION GAP: 13 (ref 5–15)
AST: 36 U/L (ref 15–41)
Alkaline Phosphatase: 55 U/L (ref 38–126)
BILIRUBIN TOTAL: 1.1 mg/dL (ref 0.3–1.2)
BUN: 10 mg/dL (ref 6–20)
CHLORIDE: 84 mmol/L — AB (ref 101–111)
CO2: 25 mmol/L (ref 22–32)
Calcium: 8.8 mg/dL — ABNORMAL LOW (ref 8.9–10.3)
Creatinine, Ser: 0.76 mg/dL (ref 0.61–1.24)
GFR calc Af Amer: >60 mL/min (ref >60)
GFR calc non Af Amer: >60 mL/min (ref >60)
GLUCOSE: 104 mg/dL — AB (ref 65–99)
POTASSIUM: 3.2 mmol/L — AB (ref 3.5–5.1)
SODIUM: 122 mmol/L — AB (ref 135–145)
TOTAL PROTEIN: 7.7 g/dL (ref 6.5–8.1)

## 2016-02-21 LAB — CBC WITH DIFFERENTIAL/PLATELET
BASOS ABS: 0 10*3/uL (ref 0.0–0.1)
Basophils Relative: 0 %
EOS PCT: 2 %
Eosinophils Absolute: 0.2 10*3/uL (ref 0.0–0.7)
HEMATOCRIT: 38.1 % — AB (ref 39.0–52.0)
Hemoglobin: 13.4 g/dL (ref 13.0–17.0)
LYMPHS ABS: 1.4 10*3/uL (ref 0.7–4.0)
LYMPHS PCT: 14 %
MCH: 29.4 pg (ref 26.0–34.0)
MCHC: 35.2 g/dL (ref 30.0–36.0)
MCV: 83.6 fL (ref 78.0–100.0)
MONO ABS: 1.1 10*3/uL — AB (ref 0.1–1.0)
Monocytes Relative: 11 %
NEUTROS ABS: 7.2 10*3/uL (ref 1.7–7.7)
Neutrophils Relative %: 73 %
Platelets: 196 10*3/uL (ref 150–400)
RBC: 4.56 MIL/uL (ref 4.22–5.81)
RDW: 12.9 % (ref 11.5–15.5)
WBC: 9.8 10*3/uL (ref 4.0–10.5)

## 2016-02-21 LAB — BASIC METABOLIC PANEL
ANION GAP: 11 (ref 5–15)
BUN: 8 mg/dL (ref 6–20)
CHLORIDE: 91 mmol/L — AB (ref 101–111)
CO2: 25 mmol/L (ref 22–32)
Calcium: 8.8 mg/dL — ABNORMAL LOW (ref 8.9–10.3)
Creatinine, Ser: 0.69 mg/dL (ref 0.61–1.24)
GFR calc non Af Amer: >60 mL/min (ref >60)
Glucose, Bld: 130 mg/dL — ABNORMAL HIGH (ref 65–99)
POTASSIUM: 3.8 mmol/L (ref 3.5–5.1)
Sodium: 127 mmol/L — ABNORMAL LOW (ref 135–145)

## 2016-02-21 LAB — RAPID URINE DRUG SCREEN, HOSP PERFORMED
AMPHETAMINES: NOT DETECTED
BENZODIAZEPINES: NOT DETECTED
Barbiturates: NOT DETECTED
COCAINE: NOT DETECTED
OPIATES: NOT DETECTED
TETRAHYDROCANNABINOL: NOT DETECTED

## 2016-02-21 LAB — INFLUENZA PANEL BY PCR (TYPE A & B)
H1N1FLUPCR: NOT DETECTED
Influenza A By PCR: NEGATIVE
Influenza B By PCR: NEGATIVE

## 2016-02-21 LAB — URINALYSIS, ROUTINE W REFLEX MICROSCOPIC
BILIRUBIN URINE: NEGATIVE
GLUCOSE, UA: NEGATIVE mg/dL
HGB URINE DIPSTICK: NEGATIVE
Ketones, ur: NEGATIVE mg/dL
Leukocytes, UA: NEGATIVE
Nitrite: NEGATIVE
PH: 5.5 (ref 5.0–8.0)
Protein, ur: NEGATIVE mg/dL
SPECIFIC GRAVITY, URINE: 1.01 (ref 1.005–1.030)

## 2016-02-21 MED ORDER — BENZONATATE 100 MG PO CAPS: 200.0000 mg | ORAL_CAPSULE | Freq: Two times a day (BID) | ORAL | Status: DC

## 2016-02-21 MED ORDER — ARFORMOTEROL TARTRATE 15 MCG/2ML IN NEBU
15.0000 ug | INHALATION_SOLUTION | Freq: Two times a day (BID) | RESPIRATORY_TRACT | Status: DC
  Administered 2016-02-21 – 2016-02-24 (×6): 15 ug via RESPIRATORY_TRACT
  Filled 2016-02-21 (×6): qty 2

## 2016-02-21 MED ORDER — PREDNISONE 20 MG PO TABS
60.0000 mg | ORAL_TABLET | Freq: Every day | ORAL | Status: DC
  Administered 2016-02-22 – 2016-02-24 (×3): 60 mg via ORAL
  Filled 2016-02-21 (×3): qty 3

## 2016-02-21 MED ORDER — BUDESONIDE 0.5 MG/2ML IN SUSP
0.5000 mg | Freq: Two times a day (BID) | RESPIRATORY_TRACT | Status: DC
  Administered 2016-02-21 – 2016-02-24 (×6): 0.5 mg via RESPIRATORY_TRACT
  Filled 2016-02-21 (×6): qty 2

## 2016-02-21 MED ORDER — POTASSIUM CHLORIDE 20 MEQ PO PACK
40.0000 meq | PACK | ORAL | Status: AC
  Administered 2016-02-21: 40 meq via ORAL
  Filled 2016-02-21: qty 2

## 2016-02-21 MED ORDER — IPRATROPIUM-ALBUTEROL 0.5-2.5 (3) MG/3ML IN SOLN: 3.0000 mL | RESPIRATORY_TRACT | Status: DC | PRN

## 2016-02-21 MED ORDER — AMLODIPINE BESYLATE 5 MG PO TABS: 10.0000 mg | ORAL_TABLET | Freq: Every day | ORAL | Status: DC

## 2016-02-21 MED ORDER — ACETAMINOPHEN 325 MG PO TABS
650.0000 mg | ORAL_TABLET | Freq: Four times a day (QID) | ORAL | Status: DC | PRN
  Administered 2016-02-22: 650 mg via ORAL
  Filled 2016-02-21: qty 2

## 2016-02-21 MED ORDER — ACETAMINOPHEN 650 MG RE SUPP: 650.0000 mg | Freq: Four times a day (QID) | RECTAL | Status: DC | PRN

## 2016-02-21 MED ORDER — NICOTINE 21 MG/24HR TD PT24
21.0000 mg | MEDICATED_PATCH | Freq: Every day | TRANSDERMAL | Status: DC
  Filled 2016-02-21: qty 1

## 2016-02-21 MED ORDER — AMLODIPINE BESYLATE 5 MG PO TABS
10.0000 mg | ORAL_TABLET | Freq: Every day | ORAL | Status: DC
  Administered 2016-02-22 – 2016-02-24 (×3): 10 mg via ORAL
  Filled 2016-02-21 (×3): qty 2

## 2016-02-21 MED ORDER — IPRATROPIUM BROMIDE 0.02 % IN SOLN: 0.5000 mg | Freq: Once | RESPIRATORY_TRACT | Status: DC

## 2016-02-21 MED ORDER — CITALOPRAM HYDROBROMIDE 20 MG PO TABS
20.0000 mg | ORAL_TABLET | Freq: Every day | ORAL | Status: DC
  Administered 2016-02-22 – 2016-02-24 (×3): 20 mg via ORAL
  Filled 2016-02-21 (×3): qty 1

## 2016-02-21 MED ORDER — IPRATROPIUM-ALBUTEROL 0.5-2.5 (3) MG/3ML IN SOLN
3.0000 mL | Freq: Four times a day (QID) | RESPIRATORY_TRACT | Status: DC
  Administered 2016-02-21 – 2016-02-23 (×5): 3 mL via RESPIRATORY_TRACT
  Filled 2016-02-21 (×6): qty 3

## 2016-02-21 MED ORDER — GUAIFENESIN ER 600 MG PO TB12
600.0000 mg | ORAL_TABLET | Freq: Two times a day (BID) | ORAL | Status: DC
  Administered 2016-02-21 – 2016-02-24 (×6): 600 mg via ORAL
  Filled 2016-02-21 (×6): qty 1

## 2016-02-21 MED ORDER — ENOXAPARIN SODIUM 40 MG/0.4ML ~~LOC~~ SOLN
40.0000 mg | SUBCUTANEOUS | Status: DC
  Administered 2016-02-21 – 2016-02-23 (×3): 40 mg via SUBCUTANEOUS
  Filled 2016-02-21 (×3): qty 0.4

## 2016-02-21 MED ORDER — FOLIC ACID 1 MG PO TABS
1.0000 mg | ORAL_TABLET | Freq: Every day | ORAL | Status: DC
  Administered 2016-02-21 – 2016-02-24 (×4): 1 mg via ORAL
  Filled 2016-02-21 (×4): qty 1

## 2016-02-21 MED ORDER — LORAZEPAM 2 MG/ML IJ SOLN: 1.0000 mg | Freq: Four times a day (QID) | INTRAMUSCULAR | Status: DC | PRN

## 2016-02-21 MED ORDER — BENZONATATE 100 MG PO CAPS
200.0000 mg | ORAL_CAPSULE | Freq: Two times a day (BID) | ORAL | Status: DC | PRN
Start: 2016-02-21 — End: 2016-02-24

## 2016-02-21 MED ORDER — THIAMINE HCL 100 MG/ML IJ SOLN: 100.0000 mg | Freq: Every day | INTRAMUSCULAR | Status: DC

## 2016-02-21 MED ORDER — VITAMIN B-1 100 MG PO TABS
100.0000 mg | ORAL_TABLET | Freq: Every day | ORAL | Status: DC
  Administered 2016-02-21 – 2016-02-24 (×4): 100 mg via ORAL
  Filled 2016-02-21 (×4): qty 1

## 2016-02-21 MED ORDER — LORAZEPAM 1 MG PO TABS
1.0000 mg | ORAL_TABLET | Freq: Four times a day (QID) | ORAL | Status: DC | PRN
  Administered 2016-02-21: 1 mg via ORAL
  Filled 2016-02-21: qty 1

## 2016-02-21 MED ORDER — MEGESTROL ACETATE 40 MG PO TABS
20.0000 mg | ORAL_TABLET | Freq: Every day | ORAL | Status: DC
  Administered 2016-02-22 – 2016-02-24 (×3): 20 mg via ORAL
  Filled 2016-02-21 (×2): qty 0.5
  Filled 2016-02-21 (×3): qty 1
  Filled 2016-02-21: qty 0.5

## 2016-02-21 MED ORDER — ALBUTEROL SULFATE (2.5 MG/3ML) 0.083% IN NEBU
5.0000 mg | INHALATION_SOLUTION | Freq: Once | RESPIRATORY_TRACT | Status: AC
Start: 2016-02-21 — End: 2016-02-21
  Administered 2016-02-21: 5 mg via RESPIRATORY_TRACT
  Filled 2016-02-21: qty 6

## 2016-02-21 MED ORDER — SODIUM CHLORIDE 0.9 % IV BOLUS (SEPSIS)
1000.0000 mL | Freq: Once | INTRAVENOUS | Status: AC
  Administered 2016-02-21: 1000 mL via INTRAVENOUS

## 2016-02-21 MED ORDER — ADULT MULTIVITAMIN W/MINERALS CH
1.0000 | ORAL_TABLET | Freq: Every day | ORAL | Status: DC
  Administered 2016-02-21 – 2016-02-24 (×4): 1 via ORAL
  Filled 2016-02-21 (×4): qty 1

## 2016-02-21 MED ORDER — ASPIRIN EC 81 MG PO TBEC
162.0000 mg | DELAYED_RELEASE_TABLET | Freq: Every day | ORAL | Status: DC
  Administered 2016-02-21 – 2016-02-24 (×4): 162 mg via ORAL
  Filled 2016-02-21 (×4): qty 2

## 2016-02-21 MED ORDER — LEVOFLOXACIN 750 MG PO TABS
750.0000 mg | ORAL_TABLET | ORAL | Status: DC
  Administered 2016-02-21 – 2016-02-23 (×3): 750 mg via ORAL
  Filled 2016-02-21 (×3): qty 1

## 2016-02-21 MED ORDER — PREDNISONE 50 MG PO TABS
60.0000 mg | ORAL_TABLET | Freq: Once | ORAL | Status: AC
  Administered 2016-02-21: 60 mg via ORAL
  Filled 2016-02-21: qty 1

## 2016-02-21 MED ORDER — ALBUTEROL SULFATE (2.5 MG/3ML) 0.083% IN NEBU: 5.0000 mg | INHALATION_SOLUTION | Freq: Once | RESPIRATORY_TRACT | Status: DC

## 2016-02-21 NOTE — ED Provider Notes (Signed)
CSN: [id number]     Arrival date & time 02/21/16  1405 History   First MD Initiated Contact with Patient 02/21/16 1616     Chief Complaint  Patient presents with  . Shortness of Breath     Patient is a 60 y.o. male presenting with shortness of breath. The history is provided by the patient.  Shortness of Breath Severity:  Moderate Onset quality:  Gradual Duration:  1 day Timing:  Intermittent Progression:  Worsening Chronicity:  New Relieved by:  Nothing Worsened by:  Nothing tried Associated symptoms: cough and wheezing   Associated symptoms: no chest pain, no fever and no vomiting   Risk factors: tobacco use   patient reports cough/wheezing/SOB since yesterday Similar to prior episodes of COPD He reports phlegm production with some blood mixed in No CP No fever No vomiting He is a smoker  Past Medical History  Diagnosis Date  . COPD (chronic obstructive pulmonary disease) (HCC)   . Essential hypertension, benign   . Nicotine addiction   . Alcohol abuse   . Asthma   . History of neutropenia     Also lymphocytosis - followed by Dr. Zigmund Daniel  . Coronary atherosclerosis     Based on chest CT 2012   Past Surgical History  Procedure Laterality Date  . Multiple tooth extractions  June 2012   Family History  Problem Relation Age of Onset  . Diabetes Mother   . Hypertension Mother   . Diabetes Father   . Hypertension Sister    Social History  Substance Use Topics  . Smoking status: Current Every Day Smoker -- 1.00 packs/day for .5 years    Types: Cigarettes  . Smokeless tobacco: Never Used     Comment: quit around christmas 2016  . Alcohol Use: 10.8 oz/week    18 Cans of beer per week     Comment: "drinks beer every once in awhile"    Review of Systems  Constitutional: Negative for fever.  Respiratory: Positive for cough, shortness of breath and wheezing.   Cardiovascular: Negative for chest pain.  Gastrointestinal: Negative for vomiting.  All other systems  reviewed and are negative.     Allergies  Lisinopril  Home Medications   Prior to Admission medications   Medication Sig Start Date End Date Taking? Authorizing Provider  albuterol (PROAIR HFA) 108 (90 BASE) MCG/ACT inhaler INHALE 2 PUFFS INTO THE LUNGS EVERY 6 HOURS AS NEEDED FOR WHEEZING. 10/24/15   Kerri Perches, MD  amLODipine (NORVASC) 10 MG tablet Take 1 tablet (10 mg total) by mouth daily. 11/01/15   Kerri Perches, MD  aspirin 81 MG EC tablet Take 162 mg by mouth daily.     Historical Provider, MD  citalopram (CELEXA) 20 MG tablet TAKE ONE TABLET BY MOUTH ONCE DAILY. 07/11/15   Kerri Perches, MD  ergocalciferol (VITAMIN D2) 50000 UNITS capsule Take 1 capsule (50,000 Units total) by mouth once a week. One capsule once weekly 11/01/15   Kerri Perches, MD  ipratropium-albuterol (DUONEB) 0.5-2.5 (3) MG/3ML SOLN Take 3 mLs by nebulization every 6 (six) hours as needed. 09/21/15   Kerri Perches, MD  megestrol (MEGACE) 20 MG tablet TAKE ONE TABLET BY MOUTH DAILY. 01/30/16   Kerri Perches, MD  promethazine-dextromethorphan (PROMETHAZINE-DM) 6.25-15 MG/5ML syrup One teaspoon at bedtime, as needed, for excessive cough 12/29/15   Kerri Perches, MD  SYMBICORT 160-4.5 MCG/ACT inhaler INHALE 2 PUFFS BY MOUTH TWICE DAILY. 01/24/16   Milus Mallick  Lodema HongSimpson, MD  triamterene-hydrochlorothiazide (MAXZIDE) 75-50 MG per tablet TAKE ONE TABLET BY MOUTH DAILY. 07/11/15   Kerri PerchesMargaret E Simpson, MD   BP 132/88 mmHg  Pulse 92  Temp(Src) 98.3 F (36.8 C) (Oral)  Resp 18  Ht 5\' 6"  (1.676 m)  Wt 63.504 kg  BMI 22.61 kg/m2  SpO2 96% Physical Exam CONSTITUTIONAL: thin appearing, no distress HEAD: Normocephalic/atraumatic EYES: EOMI ENMT: Mucous membranes moist, no stridor is noted NECK: supple no meningeal signs SPINE/BACK:entire spine nontender CV: S1/S2 noted, no murmurs/rubs/gallops noted LUNGS: wheezing bilaterally ABDOMEN: soft, nontender, no rebound or guarding, bowel  sounds noted throughout abdomen NEURO: Pt is awake/alert/appropriate, moves all extremitiesx4.  No facial droop.   EXTREMITIES: pulses normal/equal, full ROM SKIN: warm, color normal PSYCH: no abnormalities of mood noted, alert and oriented to situation  ED Course  Procedures  5:36 PM Pt with COPD exacerbation which could use more treatments Also - has significant hyPOnatremia, will need fluid replacement, suspect due to his diuretics Pt agreeable D/w dr Madelyn Flavorsrondell smith for admission  Labs Review Labs Reviewed  COMPREHENSIVE METABOLIC PANEL - Abnormal; Notable for the following:    Sodium 122 (*)    Potassium 3.2 (*)    Chloride 84 (*)    Glucose, Bld 104 (*)    Calcium 8.8 (*)    All other components within normal limits  CBC WITH DIFFERENTIAL/PLATELET - Abnormal; Notable for the following:    HCT 38.1 (*)    Monocytes Absolute 1.1 (*)    All other components within normal limits    Imaging Review Dg Chest 2 View  02/21/2016  CLINICAL DATA:  Cough and shortness of breath since last night. Hemoptysis. History of smoking and COPD. EXAM: CHEST  2 VIEW COMPARISON:  10/29/2015 FINDINGS: Heart, mediastinum and hila are within normal limits. Lungs are clear. No pleural effusion or pneumothorax. No lung mass or suspicious nodule. Bony thorax is demineralized but grossly intact. IMPRESSION: No acute cardiopulmonary disease. Electronically Signed   By: Amie Portlandavid  Ormond M.D.   On: 02/21/2016 14:47   I have personally reviewed and evaluated these images and lab results as part of my medical decision-making.   EKG Interpretation   Date/Time:  Tuesday February 21 2016 14:12:56 EST Ventricular Rate:  106 PR Interval:  148 QRS Duration: 80 QT Interval:  332 QTC Calculation: 441 R Axis:   76 Text Interpretation:  Sinus tachycardia Minimal voltage criteria for LVH,  may be normal variant ST \\T \ T wave abnormality, consider inferolateral  ischemia Abnormal ECG changed from prior Confirmed by  [facility]  MD, Pharoah Goggins  [phone]([id number]) on 02/21/2016 4:23:42 PM     Medications  albuterol (PROVENTIL) (2.5 MG/3ML) 0.083% nebulizer solution 5 mg (not administered)  ipratropium (ATROVENT) nebulizer solution 0.5 mg (not administered)  albuterol (PROVENTIL) (2.5 MG/3ML) 0.083% nebulizer solution 5 mg (5 mg Nebulization Given 02/21/16 1627)  sodium chloride 0.9 % bolus 1,000 mL (1,000 mLs Intravenous New Bag/Given 02/21/16 1701)  predniSONE (DELTASONE) tablet 60 mg (60 mg Oral Given 02/21/16 1705)    MDM   Final diagnoses:  Chronic obstructive pulmonary disease with acute exacerbation (HCC)  Hyponatremia  Dehydration    Nursing notes including past medical history and social history reviewed and considered in documentation xrays/imaging reviewed by myself and considered during evaluation Labs/vital reviewed myself and considered during evaluation     Zadie Rhineonald Dequita Schleicher, MD 02/21/16 1737

## 2016-02-21 NOTE — H&P (Addendum)
[facility] History and Physical  [name] [id number] DOB: November 18, 1956 DOA: 02/21/2016  Referring physician: Dr. Bebe ShaggyWickline PCP: Syliva OvermanMargaret Simpson, MD   Chief Complaint: Cough and shortness of breath  HPI:  [name] is a 60 year old male with past medical history significant for HTN, COPD, tobacco and alcohol abuse; who presents with complaints of progressively worsening cough and shortness of breath. Symptoms started approximately 2-3 days ago. Reports having a productive cough with whitish sputum production. He tried using home Symbicort and albuterol temporary relief of symptoms. Associated symptoms include wheezing, malaise, diarrhea( reports 2-3 bowel movements loose stools), and scant amount of blood in sputum coughed up noted this morning. Denies any fever, chest pain, or leg swelling. On average he smokes about half a pack to a pack of cigarettes per day, but has not smoked any in the last 2-3 days. He reports drinking on a regular basis and had at least 3 beers prior to coming in today to the hospital. He denies any new medications for the last 2 months. 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 Upon admission patient is evaluated in the emergency department and initially given 1 breathing chest x-ray showed no acute signs of infection. Initial lab work revealed sodium 122, potassium 3.2, all other labs including CBC within normal limits.  Review of Systems  Constitutional: Positive for chills and malaise/fatigue. Negative for fever.  HENT: Negative for ear pain, hearing loss and tinnitus.   Eyes: Negative for photophobia and pain.  Respiratory: Positive for cough, hemoptysis, sputum production, shortness of breath and wheezing.   Cardiovascular: Negative for chest pain and leg swelling.  Gastrointestinal: Positive for diarrhea.  Genitourinary: Negative for urgency and frequency.  Musculoskeletal:  Negative for joint pain and falls.  Skin: Negative for itching and rash.  Neurological: Positive for tremors. Negative for tingling, weakness and headaches.  Psychiatric/Behavioral: Positive for substance abuse.    Past Medical History  Diagnosis Date  . COPD (chronic obstructive pulmonary disease) (HCC)   . Essential hypertension, benign   . Nicotine addiction   . Alcohol abuse   . Asthma   . History of neutropenia     Also lymphocytosis - followed by Dr. Zigmund DanielFormanek  . Coronary atherosclerosis     Based on chest CT 2012     Past Surgical History  Procedure Laterality Date  . Multiple tooth extractions  June 2012      Social History:  reports that he has been smoking Cigarettes.  He has a .5 pack-year smoking history. He has never used smokeless tobacco. He reports that he drinks about 10.8 oz of alcohol per week. He reports that he does not use illicit drugs. Where does patient live--home Can patient participate in ADLs? Yes  Allergies  Allergen Reactions  . Lisinopril     Angioedema while on this medication    Family History  Problem Relation Age of Onset  . Diabetes Mother   . Hypertension Mother   . Diabetes Father   . Hypertension Sister         Prior to Admission medications   Medication Sig Start Date End Date Taking? Authorizing Provider  albuterol (PROAIR HFA) 108 (90 BASE) MCG/ACT inhaler INHALE 2 PUFFS INTO THE LUNGS EVERY 6 HOURS AS NEEDED FOR WHEEZING. 10/24/15   Kerri PerchesMargaret E Simpson, MD  amLODipine (NORVASC) 10 MG tablet Take 1 tablet (10 mg total) by mouth daily. 11/01/15  Kerri Perches, MD  aspirin 81 MG EC tablet Take 162 mg by mouth daily.     Historical Provider, MD  citalopram (CELEXA) 20 MG tablet TAKE ONE TABLET BY MOUTH ONCE DAILY. 07/11/15   Kerri Perches, MD  ergocalciferol (VITAMIN D2) 50000 UNITS capsule Take 1 capsule (50,000 Units total) by mouth once a week. One capsule once weekly 11/01/15   Kerri Perches, MD   ipratropium-albuterol (DUONEB) 0.5-2.5 (3) MG/3ML SOLN Take 3 mLs by nebulization every 6 (six) hours as needed. 09/21/15   Kerri Perches, MD  megestrol (MEGACE) 20 MG tablet TAKE ONE TABLET BY MOUTH DAILY. 01/30/16   Kerri Perches, MD  promethazine-dextromethorphan (PROMETHAZINE-DM) 6.25-15 MG/5ML syrup One teaspoon at bedtime, as needed, for excessive cough 12/29/15   Kerri Perches, MD  SYMBICORT 160-4.5 MCG/ACT inhaler INHALE 2 PUFFS BY MOUTH TWICE DAILY. 01/24/16   Kerri Perches, MD  triamterene-hydrochlorothiazide (MAXZIDE) 75-50 MG per tablet TAKE ONE TABLET BY MOUTH DAILY. 07/11/15   Kerri Perches, MD     Physical Exam: Filed Vitals:   02/21/16 1417 02/21/16 1630 02/21/16 1702 02/21/16 1730  BP: 123/71  132/88 121/76  Pulse: 103  92 98  Temp: 98.3 F (36.8 C)     TempSrc: Oral     Resp: Height:  (1.676 m)     Weight: 63.504 kg (140 lb)     SpO2: 98% 98% 96% 96%     Constitutional: Vital signs reviewed. Patient is a relatively thin male who appears to be slightly older than stated age found to be tremulous in the hospital bed. Head: Normocephalic and atraumatic  Ear: TM normal bilaterally  Mouth: no erythema or exudates, MMM  Eyes: PERRL, EOMI, conjunctivae normal, No scleral icterus.  Neck: Supple, Trachea midline normal ROM, No JVD, mass, thyromegaly, or carotid bruit present.  Cardiovascular: RRR, S1 normal, S2 normal, no MRG, pulses symmetric and intact bilaterally  Pulmonary/Chest: Diffuse bilateral wheezes appreciated throughout both lung fields, patient able to talk in 4-5 word sentences, no accessory muscle usage. Abdominal: Soft. Non-tender, non-distended, bowel sounds are normal, no masses, organomegaly, or guarding present.  GU: no CVA tenderness Musculoskeletal: No joint deformities, erythema, or stiffness, ROM full and no nontender Ext: no edema and no cyanosis, pulses palpable bilaterally (DP and PT)  Hematology: no cervical,  inginal, or axillary adenopathy.  Neurological: A&O x3, Strenght is normal and symmetric bilaterally, cranial nerve II-XII are grossly intact, no focal motor deficit, sensory intact to light touch bilaterally.  Skin: Warm, dry and intact. No rash, cyanosis, or clubbing.  Psychiatric: Normal mood and affect. speech and behavior is normal. Judgment and thought content normal. Cognition and memory are normal.      Data Review   Micro Results No results found for this or any previous visit (from the past 240 hour(s)).  Radiology Reports Dg Chest 2 View  02/21/2016  CLINICAL DATA:  Cough and shortness of breath since last night. Hemoptysis. History of smoking and COPD. EXAM: CHEST  2 VIEW COMPARISON:  10/29/2015 FINDINGS: Heart, mediastinum and hila are within normal limits. Lungs are clear. No pleural effusion or pneumothorax. No lung mass or suspicious nodule. Bony thorax is demineralized but grossly intact. IMPRESSION: No acute cardiopulmonary disease. Electronically Signed   By: Amie Portland M.D.   On: 02/21/2016 14:47     CBC  Recent Labs Lab 02/21/16 1450  WBC 9.8  HGB 13.4  HCT 38.1*  PLT 196  MCV 83.6  MCH 29.4  MCHC 35.2  RDW 12.9  LYMPHSABS 1.4  MONOABS 1.1*  EOSABS 0.2  BASOSABS 0.0    Chemistries   Recent Labs Lab 02/21/16 1450  NA 122*  K 3.2*  CL 84*  CO2 25  GLUCOSE 104*  BUN 10  CREATININE 0.76  CALCIUM 8.8*  AST 36  ALT 23  ALKPHOS 55  BILITOT 1.1   ------------------------------------------------------------------------------------------------------------------ estimated creatinine clearance is 91.5 mL/min (by C-G formula based on Cr of 0.76). ------------------------------------------------------------------------------------------------------------------ No results for input(s): HGBA1C in the last 72 hours. ------------------------------------------------------------------------------------------------------------------ No results for  input(s): CHOL, HDL, LDLCALC, TRIG, CHOLHDL, LDLDIRECT in the last 72 hours. ------------------------------------------------------------------------------------------------------------------ No results for input(s): TSH, T4TOTAL, T3FREE, THYROIDAB in the last 72 hours.  Invalid input(s): FREET3 ------------------------------------------------------------------------------------------------------------------ No results for input(s): VITAMINB12, FOLATE, FERRITIN, TIBC, IRON, RETICCTPCT in the last 72 hours.  Coagulation profile No results for input(s): INR, PROTIME in the last 168 hours.  No results for input(s): DDIMER in the last 72 hours.  Cardiac Enzymes No results for input(s): CKMB, TROPONINI, MYOGLOBIN in the last 168 hours.  Invalid input(s): CK ------------------------------------------------------------------------------------------------------------------ Invalid input(s): POCBNP   CBG: No results for input(s): GLUCAP in the last 168 hours.     EKG: Independently reviewed. Sinus tachycardia rate of 106   Assessment/Plan COPD exacerbation (HCC): Acute. Patient presents with complaints of productive cough, shortness of breath, and wheezing over the last few days. Physical exam reveals diffuse bilateral wheezes. Chest x-ray shows no acute abnormalities. Oxygen saturations maintained on room air. Patient was given 60 mg of prednisone and breathing treatment while in the ED. - Admit to MedSurg bed - Oxygen if needed to maintain O2 sats greater than 92% - check influenza screen - Prednisone 60 mg daily for now - DuoNeb's every 6 hours and prn every 2 hours - Home Symbicort change to nebs overnight with budesonide and Brovana - Levaquin po - Mucinex - Tessalon Perles prn cough  Hyponatremia: Acute. Sodium was previously 134 on 12/28/2015 and is currently 122 on admission. Patient has been hyponatremic before back in 03/2015 at 125. Question medications including patient's  diuretic with recent illness or possibly citalopram. Patient was given 1 L of normal saline IV fluids in the emergency department.  - goal is to not overcorrect more than 10 mmol/L  - Recheck BMP at 8 PM and will reassess for need of IV fluids overnight - Recheck BMP tomorrow at 5 AM  Essential Hypertension: Currently well controlled - Continue amlodipine - Held home dose of Maxzide secondary to significant hyponatremia  Alcohol abuse: Patient reports he last drank approximately 3 beers just prior to admission. - CWIAA Protocols  Tobacco abuse/Nicotine dependence: Patient still reports smoking anywhere from half a pack to pack of cigarettes per day. - Patient offered a nicotine patch hospitalized - Counseled on need for cessation of tobacco   Depression history - Continued citalopram, but will likely need to follow-up and determine if this medication needs to be discontinued  Hypokalemia: Potassium initially 3.2.  - 40 mEq of potassium chloride 1 dose now, should put patient's potassium in normal range closed with 3.6. - Recheck BMP in a.m. replace if needed  Underweight - Continue Megace   Code Status:   full Family Communication: bedside Disposition Plan: admit   Total time spent 55 minutes.Greater than 50% of this time was spent in counseling, explanation of diagnosis, planning of further management, and coordination of care  Rondell A Smith [facility] Pager [phone]  If 7PM-7AM, please contact night-coverage [email or web address] Password [facility] 02/21/2016, 5:39 PM

## 2016-02-21 NOTE — ED Notes (Signed)
Pt reports SOB and productive cough that began last night. Pt also reports difficulty clearing throat. Pt hx of COPD and asthma. Denies fever.

## 2016-02-22 DIAGNOSIS — F1010 Alcohol abuse, uncomplicated: Secondary | ICD-10-CM

## 2016-02-22 DIAGNOSIS — I10 Essential (primary) hypertension: Secondary | ICD-10-CM

## 2016-02-22 DIAGNOSIS — E871 Hypo-osmolality and hyponatremia: Secondary | ICD-10-CM

## 2016-02-22 DIAGNOSIS — J441 Chronic obstructive pulmonary disease with (acute) exacerbation: Secondary | ICD-10-CM | POA: Diagnosis not present

## 2016-02-22 DIAGNOSIS — F418 Other specified anxiety disorders: Secondary | ICD-10-CM | POA: Diagnosis not present

## 2016-02-22 LAB — BASIC METABOLIC PANEL
ANION GAP: 11 (ref 5–15)
BUN: 10 mg/dL (ref 6–20)
CHLORIDE: 94 mmol/L — AB (ref 101–111)
CO2: 26 mmol/L (ref 22–32)
Calcium: 9.1 mg/dL (ref 8.9–10.3)
Creatinine, Ser: 0.78 mg/dL (ref 0.61–1.24)
GFR calc non Af Amer: >60 mL/min (ref >60)
Glucose, Bld: 112 mg/dL — ABNORMAL HIGH (ref 65–99)
POTASSIUM: 3.9 mmol/L (ref 3.5–5.1)
Sodium: 131 mmol/L — ABNORMAL LOW (ref 135–145)

## 2016-02-22 LAB — CBC
HEMATOCRIT: 38.1 % — AB (ref 39.0–52.0)
HEMOGLOBIN: 13.2 g/dL (ref 13.0–17.0)
MCH: 29.1 pg (ref 26.0–34.0)
MCHC: 34.6 g/dL (ref 30.0–36.0)
MCV: 83.9 fL (ref 78.0–100.0)
Platelets: 217 10*3/uL (ref 150–400)
RBC: 4.54 MIL/uL (ref 4.22–5.81)
RDW: 13 % (ref 11.5–15.5)
WBC: 6.4 10*3/uL (ref 4.0–10.5)

## 2016-02-22 MED ORDER — FOLIC ACID 1 MG PO TABS: 1.0000 mg | ORAL_TABLET | Freq: Every day | ORAL | Status: DC

## 2016-02-22 MED ORDER — LORAZEPAM 2 MG/ML IJ SOLN: 1.0000 mg | Freq: Four times a day (QID) | INTRAMUSCULAR | Status: DC | PRN

## 2016-02-22 MED ORDER — LORAZEPAM 2 MG/ML IJ SOLN: 0.0000 mg | Freq: Two times a day (BID) | INTRAMUSCULAR | Status: DC

## 2016-02-22 MED ORDER — SODIUM CHLORIDE 0.9 % IV SOLN
INTRAVENOUS | Status: DC
  Administered 2016-02-22 – 2016-02-23 (×2): via INTRAVENOUS

## 2016-02-22 MED ORDER — THIAMINE HCL 100 MG/ML IJ SOLN: 100.0000 mg | Freq: Every day | INTRAMUSCULAR | Status: DC

## 2016-02-22 MED ORDER — ADULT MULTIVITAMIN W/MINERALS CH: 1.0000 | ORAL_TABLET | Freq: Every day | ORAL | Status: DC

## 2016-02-22 MED ORDER — LORAZEPAM 1 MG PO TABS: 1.0000 mg | ORAL_TABLET | Freq: Four times a day (QID) | ORAL | Status: DC | PRN

## 2016-02-22 MED ORDER — LORAZEPAM 2 MG/ML IJ SOLN
0.0000 mg | Freq: Four times a day (QID) | INTRAMUSCULAR | Status: DC
  Administered 2016-02-23: 2 mg via INTRAVENOUS
  Administered 2016-02-24: 1 mg via INTRAVENOUS
  Administered 2016-02-24: 2 mg via INTRAVENOUS
  Filled 2016-02-22 (×3): qty 1

## 2016-02-22 MED ORDER — VITAMIN B-1 100 MG PO TABS: 100.0000 mg | ORAL_TABLET | Freq: Every day | ORAL | Status: DC

## 2016-02-22 NOTE — Progress Notes (Signed)
[facility] PROGRESS NOTE  Noel Geroldrnest D Buchta [id number] DOB: 1956/04/29 DOA: 02/21/2016 PCP: Syliva OvermanMargaret Simpson, MD  Assessment/Plan: 1.  COPD exacerbation, symptoms included; productive cough, wheezing. He was started on steroids, nebs, PO abx, mucinex, and tessalon Perles. Influenza screening negative. He did not require oxygen supplementation during hospitalization. On discharge will transition to abx and steroid taper. Continue nebs, and continue pulmonary hygiene.  2. Hyponatremia, secondary to dehydration/ diuretics . Continues to improve.  3. Hyporkalemia, replaced.  4. Essential HTN, stable. Maxzide held in the setting of hyponatremia. Blood cultures currently stable.  5. Alcohol abuse with withdrawal. Placed on CIWA protocols. Currently has signs of alcohol withdrawal. Continue ativan. 6. Tobacco abuse. Counseled on cessation and provided nicotine patch. 7. History of depression. Continued citalopram. Follow up recommended to determined continued medication needs. 8. Underweight. Continue Megace.  Code Status: Full DVT prophylaxis: Lovenox  Family Communication: Nephew bedside  Disposition Plan: Anticipate discharge within 24 hours.    Consultants:  None  Procedures:  None   Antibiotics:  Levaquin 3/7  HPI/Subjective: Feeling better. Reports last beer being Monday evening. Mild tremor normal after extended alcohol.   Objective: Filed Vitals:   02/22/16 0546 02/22/16 1313  BP: 146/102 134/88  Pulse: 94 94  Temp: 99.4 F (37.4 C) 98.9 F (37.2 C)  Resp: 18 18    Intake/Output Summary (Last 24 hours) at 02/22/16 1414 Last data filed at 02/22/16 1100  Gross per 24 hour  Intake    600 ml  Output    300 ml  Net    300 ml   Filed Weights   02/21/16 1417 02/21/16 1855  Weight: 63.504 kg (140 lb) 58.06 kg (128 lb)    Exam:  General: Diaphoretic and tremulous  Cardiovascular: RRR, S1, S2  Respiratory:Mild wheeze bilaterally  Abdomen: soft, non  tender, no distention , bowel sounds normal Musculoskeletal: No edema b/l   Data Reviewed: Basic Metabolic Panel:  Recent Labs Lab 02/21/16 1450 02/21/16 2009 02/22/16 0344  NA 122* 127* 131*  K 3.2* 3.8 3.9  CL 84* 91* 94*  CO2 25 25 26   GLUCOSE 104* 130* 112*  BUN 10 8 10   CREATININE 0.76 0.69 0.78  CALCIUM 8.8* 8.8* 9.1   Liver Function Tests:  Recent Labs Lab 02/21/16 1450  AST 36  ALT 23  ALKPHOS 55  BILITOT 1.1  PROT 7.7  ALBUMIN 4.1   CBC:  Recent Labs Lab 02/21/16 1450 02/22/16 0344  WBC 9.8 6.4  NEUTROABS 7.2  --   HGB 13.4 13.2  HCT 38.1* 38.1*  MCV 83.6 83.9  PLT 196 217    Studies: Dg Chest 2 View  02/21/2016  CLINICAL DATA:  Cough and shortness of breath since last night. Hemoptysis. History of smoking and COPD. EXAM: CHEST  2 VIEW COMPARISON:  10/29/2015 FINDINGS: Heart, mediastinum and hila are within normal limits. Lungs are clear. No pleural effusion or pneumothorax. No lung mass or suspicious nodule. Bony thorax is demineralized but grossly intact. IMPRESSION: No acute cardiopulmonary disease. Electronically Signed   By: Amie Portlandavid  Ormond M.D.   On: 02/21/2016 14:47    Scheduled Meds: . albuterol  5 mg Nebulization Once  . amLODipine  10 mg Oral Daily  . arformoterol  15 mcg Nebulization BID  . aspirin EC  162 mg Oral Daily  . budesonide (PULMICORT) nebulizer solution  0.5 mg Nebulization BID  . citalopram  20 mg Oral Daily  . enoxaparin (LOVENOX) injection  40 mg Subcutaneous Q24H  .  folic acid  1 mg Oral Daily  . guaiFENesin  600 mg Oral BID  . ipratropium  0.5 mg Nebulization Once  . ipratropium-albuterol  3 mL Nebulization Q6H  . levofloxacin  750 mg Oral Q24H  . megestrol  20 mg Oral Daily  . multivitamin with minerals  1 tablet Oral Daily  . nicotine  21 mg Transdermal Daily  . predniSONE  60 mg Oral Q breakfast  . thiamine  100 mg Oral Daily   Or  . thiamine  100 mg Intravenous Daily   Continuous Infusions:   Principal  Problem:   COPD exacerbation (HCC) Active Problems:   Nicotine dependence, cigarettes, with other nicotine-induced disorders   Essential hypertension   Depression with anxiety   Underweight   Hyponatremia   Alcohol abuse    Time spent: 35 minutes     Erick Blinks, MD   [facility] Pager [phone]. If 7PM-7AM, please contact night-coverage at [email or web address], password [facility] 02/22/2016, 2:14 PM      By signing my name below, I, Zadie Cleverly, attest that this documentation has been prepared under the direction and in the presence of Erick Blinks, MD. Electronically signed: Zadie Cleverly, Scribe. 02/22/2016 1:53pm   I, Dr. Erick Blinks, personally performed the services described in this documentaiton. All medical record entries made by the scribe were at my direction and in my presence. I have reviewed the chart and agree that the record reflects my personal performance and is accurate and complete  Erick Blinks, MD, 02/22/2016 2:32 PM

## 2016-02-22 NOTE — Care Management Note (Signed)
Case Management Note  Patient Details  Name: [name] MRN: [id number] Date of Birth: 1956/11/09  Subjective/Objective:                  Pt admitted with COPD. Pt is from home, lives with sister and is ind with ADL's. Pt has PCP, transportation, and no problems affording meds. Pt plans to return home with self care.   Action/Plan: Potential DC today. No CM needs anticipated.   Expected Discharge Date:       02/23/2016           Expected Discharge Plan:  Home/Self Care  In-House Referral:  NA  Discharge planning Services  CM Consult  Post Acute Care Choice:  NA Choice offered to:  NA  DME Arranged:    DME Agency:     HH Arranged:    HH Agency:     Status of Service:  Completed, signed off  Medicare Important Message Given:    Date Medicare IM Given:    Medicare IM give by:    Date Additional Medicare IM Given:    Additional Medicare Important Message give by:     If discussed at Long Length of Stay Meetings, dates discussed:    Additional Comments:  Malcolm MetroChildress, Karalyne Nusser Demske, RN 02/22/2016, 11:30 AM

## 2016-02-23 DIAGNOSIS — E871 Hypo-osmolality and hyponatremia: Secondary | ICD-10-CM | POA: Diagnosis not present

## 2016-02-23 DIAGNOSIS — F17218 Nicotine dependence, cigarettes, with other nicotine-induced disorders: Secondary | ICD-10-CM

## 2016-02-23 DIAGNOSIS — F1010 Alcohol abuse, uncomplicated: Secondary | ICD-10-CM | POA: Diagnosis not present

## 2016-02-23 DIAGNOSIS — I10 Essential (primary) hypertension: Secondary | ICD-10-CM | POA: Diagnosis not present

## 2016-02-23 DIAGNOSIS — J441 Chronic obstructive pulmonary disease with (acute) exacerbation: Secondary | ICD-10-CM | POA: Diagnosis not present

## 2016-02-23 NOTE — Progress Notes (Signed)
[facility] PROGRESS NOTE  SELMER ADDUCI [id number]:[id number] DOB: September 29, 1956 DOA: 02/21/2016 PCP: [name], MD  Assessment/Plan: 1.  COPD exacerbation, symptoms included; productive cough, wheezing. UA was negative. He was started on steroids, nebs, PO abx, mucinex, and tessalon Perles. Influenza screening negative. He did not require oxygen supplementation during hospitalization. Transition to abx and steroid taper.  2. Hyponatremia, secondary to dehydration/ diuretics . Improving. 3. Hypokalemia, replaced.  4. Essential HTN, stable. Maxzide held in the setting of hyponatremia.  5. Alcohol abuse with withdrawal. Placed on CIWA protocols. Patient has persistent tremors and appears diaphoretic. Will continue ativan, his last drink of alcohol was approximately 48 hrs ago. Continue observation. Pt appears motivated to stop drinking. 6. Tobacco abuse. Counseled on cessation and provided nicotine patch. 7. History of depression. Continued citalopram. Follow up recommended to determined continued medication needs. 8. Underweight. Continue Megace.  Code Status: Full DVT prophylaxis: Lovenox  Family Communication: No family present at bedside Disposition Plan: Anticipate discharge in 24 hrs after shaking has subsided.   Consultants:  None  Procedures:  None   Antibiotics:  Levaquin 3/7 >>  HPI/Subjective: Feels a lot better today. Breathing is doing well. Is still having some tremors. Is urinating well.   Objective: Filed Vitals:   02/22/16 2050 02/23/16 0600  BP: 132/64 138/71  Pulse: 88 81  Temp: 98.5 F (36.9 C) 98.1 F (36.7 C)  Resp: 18 18    Intake/Output Summary (Last 24 hours) at 02/23/16 0747 Last data filed at 02/23/16 0601  Gross per 24 hour  Intake   1725 ml  Output    500 ml  Net   1225 ml   Filed Weights   02/21/16 1417 02/21/16 1855  Weight: 63.504 kg (140 lb) 58.06 kg (128 lb)    Exam:  General: diaphoretic and  tremulous Cardiovascular: tachycardic Respiratory: mild wheeze bilaterally Abdomen: soft, non tender, no distention , bowel sounds normal Musculoskeletal: No edema b/l   Data Reviewed: Basic Metabolic Panel:  Recent Labs Lab 02/21/16 1450 02/21/16 2009 02/22/16 0344  NA 122* 127* 131*  K 3.2* 3.8 3.9  CL 84* 91* 94*  CO2 GLUCOSE 104* 130* 112*  BUN CREATININE 0.76 0.69 0.78  CALCIUM 8.8* 8.8* 9.1   Liver Function Tests:  Recent Labs Lab 02/21/16 1450  AST 36  ALT 23  ALKPHOS 55  BILITOT 1.1  PROT 7.7  ALBUMIN 4.1   CBC:  Recent Labs Lab 02/21/16 1450 02/22/16 0344  WBC 9.8 6.4  NEUTROABS 7.2  --   HGB 13.4 13.2  HCT 38.1* 38.1*  MCV 83.6 83.9  PLT 196 217    Studies: Dg Chest 2 View  02/21/2016  CLINICAL DATA:  Cough and shortness of breath since last night. Hemoptysis. History of smoking and COPD. EXAM: CHEST  2 VIEW COMPARISON:  10/29/2015 FINDINGS: Heart, mediastinum and hila are within normal limits. Lungs are clear. No pleural effusion or pneumothorax. No lung mass or suspicious nodule. Bony thorax is demineralized but grossly intact. IMPRESSION: No acute cardiopulmonary disease. Electronically Signed   By: Amie Portland M.D.   On: 02/21/2016 14:47    Scheduled Meds: . albuterol  5 mg Nebulization Once  . amLODipine  10 mg Oral Daily  . arformoterol  15 mcg Nebulization BID  . aspirin EC  162 mg Oral Daily  . budesonide (PULMICORT) nebulizer solution  0.5 mg Nebulization BID  . citalopram  20 mg Oral Daily  .  enoxaparin (LOVENOX) injection  40 mg Subcutaneous Q24H  . folic acid  1 mg Oral Daily  . guaiFENesin  600 mg Oral BID  . ipratropium  0.5 mg Nebulization Once  . ipratropium-albuterol  3 mL Nebulization Q6H  . levofloxacin  750 mg Oral Q24H  . LORazepam  0-4 mg Intravenous Q6H   Followed by  . [START ON 02/24/2016] LORazepam  0-4 mg Intravenous Q12H  . megestrol  20 mg Oral Daily  . multivitamin with minerals  1  tablet Oral Daily  . nicotine  21 mg Transdermal Daily  . predniSONE  60 mg Oral Q breakfast  . thiamine  100 mg Oral Daily   Or  . thiamine  100 mg Intravenous Daily   Continuous Infusions: . sodium chloride 75 mL/hr at 02/23/16 0500    Principal Problem:   COPD exacerbation (HCC) Active Problems:   Nicotine dependence, cigarettes, with other nicotine-induced disorders   Essential hypertension   Depression with anxiety   Underweight   Hyponatremia   Alcohol abuse    Time spent: 25 minutes     Erick BlinksMemon, Shalona Harbour, MD   [facility] Pager [phone]. If 7PM-7AM, please contact night-coverage at [email or web address], password [email or web address] 02/23/2016, 7:47 AM     By signing my name below, I, Adron BeneGreylon Gawaluck, attest that this documentation has been prepared under the direction and in the presence of Erick BlinksJehanzeb Brennin Durfee, MD. Electronically Signed: Adron BeneGreylon Gawaluck  02/23/2016 1:45pm  I, Dr. Erick BlinksJehanzeb Verenise Moulin, personally performed the services described in this documentaiton. All medical record entries made by the scribe were at my direction and in my presence. I have reviewed the chart and agree that the record reflects my personal performance and is accurate and complete  Erick BlinksJehanzeb Oliviya Gilkison, MD, 02/23/2016 1:57 PM

## 2016-02-24 DIAGNOSIS — F1010 Alcohol abuse, uncomplicated: Secondary | ICD-10-CM | POA: Diagnosis not present

## 2016-02-24 DIAGNOSIS — J441 Chronic obstructive pulmonary disease with (acute) exacerbation: Secondary | ICD-10-CM | POA: Diagnosis not present

## 2016-02-24 DIAGNOSIS — I10 Essential (primary) hypertension: Secondary | ICD-10-CM | POA: Diagnosis not present

## 2016-02-24 DIAGNOSIS — F418 Other specified anxiety disorders: Secondary | ICD-10-CM | POA: Diagnosis not present

## 2016-02-24 LAB — CBC
HEMATOCRIT: 36.2 % — AB (ref 39.0–52.0)
Hemoglobin: 12.6 g/dL — ABNORMAL LOW (ref 13.0–17.0)
MCH: 29.5 pg (ref 26.0–34.0)
MCHC: 34.8 g/dL (ref 30.0–36.0)
MCV: 84.8 fL (ref 78.0–100.0)
PLATELETS: 220 10*3/uL (ref 150–400)
RBC: 4.27 MIL/uL (ref 4.22–5.81)
RDW: 13.2 % (ref 11.5–15.5)
WBC: 10.3 10*3/uL (ref 4.0–10.5)

## 2016-02-24 LAB — BASIC METABOLIC PANEL
ANION GAP: 8 (ref 5–15)
BUN: 15 mg/dL (ref 6–20)
CALCIUM: 8.5 mg/dL — AB (ref 8.9–10.3)
CO2: 24 mmol/L (ref 22–32)
Chloride: 103 mmol/L (ref 101–111)
Creatinine, Ser: 0.7 mg/dL (ref 0.61–1.24)
GFR calc Af Amer: >60 mL/min (ref >60)
GLUCOSE: 96 mg/dL (ref 65–99)
POTASSIUM: 3.4 mmol/L — AB (ref 3.5–5.1)
Sodium: 135 mmol/L (ref 135–145)

## 2016-02-24 MED ORDER — PREDNISONE 10 MG PO TABS: ORAL_TABLET | ORAL | Status: DC

## 2016-02-24 MED ORDER — LORAZEPAM 1 MG PO TABS: 1.0000 mg | ORAL_TABLET | Freq: Four times a day (QID) | ORAL | Status: DC | PRN

## 2016-02-24 MED ORDER — NICOTINE POLACRILEX 2 MG MT GUM: 2.0000 mg | CHEWING_GUM | OROMUCOSAL | Status: DC | PRN

## 2016-02-24 NOTE — Progress Notes (Signed)
Patient states understanding of discharge instructions.  

## 2016-02-24 NOTE — Care Management Note (Signed)
Case Management Note  Patient Details  Name: Noel Geroldrnest D Channing MRN: [id number] Date of Birth: 02-08-1956  Expected Discharge Date:                  Expected Discharge Plan:  Home/Self Care  In-House Referral:  NA  Discharge planning Services  CM Consult  Post Acute Care Choice:  NA Choice offered to:  NA  DME Arranged:    DME Agency:     HH Arranged:    HH Agency:     Status of Service:  Completed, signed off  Medicare Important Message Given:    Date Medicare IM Given:    Medicare IM give by:    Date Additional Medicare IM Given:    Additional Medicare Important Message give by:     If discussed at Long Length of Stay Meetings, dates discussed:    Additional Comments: Pt discharging home today. No CM needs.   Malcolm Metrohildress, Elizabet Schweppe Demske, RN 02/24/2016, 12:15 PM

## 2016-02-24 NOTE — Discharge Summary (Signed)
Physician Discharge Summary  [name] ZOX:[id number] DOB: 1956/11/17 DOA: 02/21/2016  PCP: [name], MD  Admit date: 02/21/2016 Discharge date: 02/24/2016  Time spent: 35 minutes  Recommendations for Outpatient Follow-up:  1. Follow up with PCP 1-2 weeks   Discharge Diagnoses:  Principal Problem:   COPD exacerbation (HCC) Active Problems:   Nicotine dependence, cigarettes, with other nicotine-induced disorders   Essential hypertension   Depression with anxiety   Underweight   Hyponatremia   Alcohol abuse   Discharge Condition: Improved  Diet recommendation: Regular  Filed Weights   02/21/16 1417 02/21/16 1855  Weight: 63.504 kg (140 lb) 58.06 kg (128 lb)    History of present illness:  50 yom with a hx of HTN, COPD, and tobacco and alcohol abuse, presented with complaints of progressively worsening productive cough and SOB that onset 2 days prior to date of admission. Reported associated wheezing, malaise, diarrhea, and blood in sputum. He tried using home Symbicort and albuterol, but found only temporary relief. Pt drinks regularly and consumed alcohol prior to admission. While in the ED, CXR showed no acute signs of infection. He was found to have hyponatremia and hypokalemia. He was admitted for management of COPD exacerbation and alcohol withdrawal.  Hospital Course:  Pt was found to have COPD exacerbation, symptoms included; productive cough, wheezing. UA and influenza screenings were performed. Both were negative. He was started on steroids, nebs, PO abx, mucinex, and tessalon Perles. He did not require oxygen supplementation during hospitalization.He completed a course of PO abx in hospital and was transition to steroid taper on discharge. Will follow up with PCP 1-2 weeks. 1. Hyponatremia, secondary to dehydration/ diuretics . Improved with hydration. Diuretics have been discontinued. 2. Hypokalemia, replaced.  3. Essential HTN, stable. Maxzide held in  the setting of hyponatremia. BP currently stable. 4. Alcohol abuse with withdrawal. Placed on CIWA protocols and was treated with Ativan. This time, he is not having any diaphoresis, HA, or anxiety. He has a mild tremor, but this may be a chronic issue. He will be discharged with a short supply of PO ativan to be taken as needed for tremors. He was counseled on the importance of alcohol cessation, and appears motivated to quit drinking. He has refused any referrals for community resources. 5. Tobacco abuse. Counseled on cessation and provided nicotine patch. Will start Rx for nicotine gum. 6. History of depression. Continued citalopram.  7. Underweight. Continue Megace.  Procedures:  none  Consultations:  none  Discharge Exam: Filed Vitals:   02/23/16 1946 02/23/16 2157  BP:  130/66  Pulse: 85 84  Temp:  98.2 F (36.8 C)  Resp: 16 20     General: NAD, looks comfortable  Cardiovascular: RRR, S1, S2   Respiratory: mild wheeze bilaterally  Abdomen: soft, non tender, no distention , bowel sounds normal  Musculoskeletal: No edema b/l  Discharge Instructions   Discharge Instructions    Diet - low sodium heart healthy    Complete by:  As directed      Increase activity slowly    Complete by:  As directed           Current Discharge Medication List    START taking these medications   Details  LORazepam (ATIVAN) 1 MG tablet Take 1 tablet (1 mg total) by mouth every 6 (six) hours as needed for anxiety (tremors). Qty: 20 tablet, Refills: 0    nicotine polacrilex (NICORETTE) 2 MG gum Take 1 each (2 mg total) by mouth  as needed for smoking cessation. Qty: 100 tablet, Refills: 0    predniSONE (DELTASONE) 10 MG tablet Take  po daily for 2 days then  daily for 2 days then  daily for 2 days then  daily for 2 days then stop Qty: 20 tablet, Refills: 0      CONTINUE these medications which have NOT CHANGED   Details  albuterol (PROAIR HFA) 108 (90 BASE)  MCG/ACT inhaler INHALE 2 PUFFS INTO THE LUNGS EVERY 6 HOURS AS NEEDED FOR WHEEZING. Qty: 8.5 g, Refills: 5    amLODipine (NORVASC) 10 MG tablet Take 1 tablet (10 mg total) by mouth daily. Qty: 30 tablet, Refills: 3    citalopram (CELEXA) 20 MG tablet TAKE ONE TABLET BY MOUTH ONCE DAILY. Qty: 30 tablet, Refills: 4    megestrol (MEGACE) 20 MG tablet TAKE ONE TABLET BY MOUTH DAILY. Qty: 30 tablet, Refills: 2    SYMBICORT 160-4.5 MCG/ACT inhaler INHALE 2 PUFFS BY MOUTH TWICE DAILY. Qty: 10.2 g, Refills: 2    aspirin 81 MG EC tablet Take 162 mg by mouth daily.     ergocalciferol (VITAMIN D2) 50000 UNITS capsule Take 1 capsule (50,000 Units total) by mouth once a week. One capsule once weekly Qty: 12 capsule, Refills: 1    ipratropium-albuterol (DUONEB) 0.5-2.5 (3) MG/3ML SOLN Take 3 mLs by nebulization every 6 (six) hours as needed. Qty: 360 mL, Refills: 3   Associated Diagnoses: COPD exacerbation (HCC)    promethazine-dextromethorphan (PROMETHAZINE-DM) 6.25-15 MG/5ML syrup One teaspoon at bedtime, as needed, for excessive cough Qty: 180 mL, Refills: 0   Associated Diagnoses: Asthma with COPD (HCC)      STOP taking these medications     triamterene-hydrochlorothiazide (MAXZIDE) 75-50 MG per tablet        Allergies  Allergen Reactions  . Lisinopril     Angioedema while on this medication   Follow-up Information    Follow up with [name], MD In 1 week.   Specialty:  Family Medicine   Contact information:   [address], [address] [place] [address] [phone]        The results of significant diagnostics from this hospitalization (including imaging, microbiology, ancillary and laboratory) are listed below for reference.    Significant Diagnostic Studies: Dg Chest 2 View  02/21/2016  CLINICAL DATA:  Cough and shortness of breath since last night. Hemoptysis. History of smoking and COPD. EXAM: CHEST  2 VIEW COMPARISON:  10/29/2015 FINDINGS: Heart,  mediastinum and hila are within normal limits. Lungs are clear. No pleural effusion or pneumothorax. No lung mass or suspicious nodule. Bony thorax is demineralized but grossly intact. IMPRESSION: No acute cardiopulmonary disease. Electronically Signed   By: Amie Portland M.D.   On: 02/21/2016 14:47    Microbiology: No results found for this or any previous visit (from the past 240 hour(s)).   Labs: Basic Metabolic Panel:  Recent Labs Lab 02/21/16 1450 02/21/16 2009 02/22/16 0344 02/24/16 0544  NA 122* 127* 131* 135  K 3.2* 3.8 3.9 3.4*  CL 84* 91* 94* 103  CO2 GLUCOSE 104* 130* 112* 96  BUN CREATININE 0.76 0.69 0.78 0.70  CALCIUM 8.8* 8.8* 9.1 8.5*   Liver Function Tests:  Recent Labs Lab 02/21/16 1450  AST 36  ALT 23  ALKPHOS 55  BILITOT 1.1  PROT 7.7  ALBUMIN 4.1   No results for input(s): LIPASE, AMYLASE in the last 168 hours. No  results for input(s): AMMONIA in the last 168 hours. CBC:  Recent Labs Lab 02/21/16 1450 02/22/16 0344 02/24/16 0544  WBC 9.8 6.4 10.3  NEUTROABS 7.2  --   --   HGB 13.4 13.2 12.6*  HCT 38.1* 38.1* 36.2*  MCV 83.6 83.9 84.8  PLT 196 217 220   Cardiac Enzymes: No results for input(s): CKTOTAL, CKMB, CKMBINDEX, TROPONINI in the last 168 hours. BNP: BNP (last 3 results) No results for input(s): BNP in the last 8760 hours.  ProBNP (last 3 results) No results for input(s): PROBNP in the last 8760 hours.  CBG: No results for input(s): GLUCAP in the last 168 hours.     Signed:  Erick BlinksJehanzeb Virgina Deakins, MD. [facility] 02/24/2016, 11:21 AM  By signing my name below, I, Adron BeneGreylon Gawaluck, attest that this documentation has been prepared under the direction and in the presence of Erick BlinksJehanzeb Ordell Prichett, MD. Electronically Signed: Adron BeneGreylon Gawaluck  02/24/2016 11:00am   I, Dr. Erick BlinksJehanzeb Zoa Dowty, personally performed the services described in this documentaiton. All medical record entries made by the scribe were  at my direction and in my presence. I have reviewed the chart and agree that the record reflects my personal performance and is accurate and complete  Erick BlinksJehanzeb Nada Godley, MD, 02/24/2016 11:21 AM

## 2016-03-02 ENCOUNTER — Encounter: Payer: Self-pay | Admitting: Family Medicine

## 2016-03-02 ENCOUNTER — Other Ambulatory Visit: Payer: Self-pay

## 2016-03-02 ENCOUNTER — Ambulatory Visit (INDEPENDENT_AMBULATORY_CARE_PROVIDER_SITE_OTHER): Payer: BLUE CROSS/BLUE SHIELD | Admitting: Family Medicine

## 2016-03-02 VITALS — BP 104/74 | HR 102 | Resp 16 | Ht 66.0 in | Wt 139.0 lb

## 2016-03-02 DIAGNOSIS — J441 Chronic obstructive pulmonary disease with (acute) exacerbation: Secondary | ICD-10-CM | POA: Diagnosis not present

## 2016-03-02 DIAGNOSIS — F10280 Alcohol dependence with alcohol-induced anxiety disorder: Secondary | ICD-10-CM | POA: Diagnosis not present

## 2016-03-02 DIAGNOSIS — Z09 Encounter for follow-up examination after completed treatment for conditions other than malignant neoplasm: Secondary | ICD-10-CM

## 2016-03-02 DIAGNOSIS — F418 Other specified anxiety disorders: Secondary | ICD-10-CM

## 2016-03-02 DIAGNOSIS — I10 Essential (primary) hypertension: Secondary | ICD-10-CM

## 2016-03-02 MED ORDER — PREDNISONE 5 MG PO TABS: 5.0000 mg | ORAL_TABLET | Freq: Every day | ORAL | Status: DC

## 2016-03-02 MED ORDER — RANITIDINE HCL 300 MG PO TABS: 300.0000 mg | ORAL_TABLET | Freq: Every day | ORAL | Status: DC

## 2016-03-02 MED ORDER — PREDNISONE 5 MG PO TABS: 5.0000 mg | ORAL_TABLET | Freq: Every day | ORAL | Status: AC

## 2016-03-02 NOTE — Assessment & Plan Note (Signed)
Now resolved, however ot to be maintained on low dose prednisone daily for next 2 months

## 2016-03-02 NOTE — Assessment & Plan Note (Signed)
Controlled, no change in medication  

## 2016-03-02 NOTE — Assessment & Plan Note (Signed)
Controlled, no change in medication DASH diet and commitment to daily physical activity for a minimum of 30 minutes discussed and encouraged, as a part of hypertension management. The importance of attaining a healthy weight is also discussed.  BP/Weight 03/02/2016 02/23/2016 02/21/2016 12/29/2015 11/01/2015 10/29/2015 09/21/2015  Systolic BP 104 130 - 112 118 [id number] 120  Diastolic BP 74 66 - 74 76 77 88  Wt. (Lbs) 139 - 128 135 134.4 143 128  BMI 22.45 - 20.67 21.8 21.7 23.09 17.36

## 2016-03-02 NOTE — Progress Notes (Signed)
   Subjective:    Patient ID: [name], male    DOB: [birth year], 58 y.o.   MRN: [id number]  HPI Patient in for follow up of recent hospitalization. Discharge summary, and laboratory and radiology data are reviewed, and any questions or concerns about recent hospitalization are discussed. Specific issues requiring follow up are specifically addressed. States he is tired of seeing Docs and going to the hospital, initially very agitated, however, by end of visit , visbly calmer and willing to engage in his care   Review of Systems See HPI Denies recent fever or chills. Denies sinus pressure, nasal congestion, ear pain or sore throat. Denies chest congestion, productive cough or wheezing. Denies chest pains, palpitations and leg swelling Denies abdominal pain, nausea, vomiting,diarrhea or constipation.   Denies dysuria, frequency, hesitancy or incontinence. Denies joint pain, swelling and limitation in mobility. Denies headaches, seizures, numbness, or tingling. Denies uncontrolled depression, anxiety or insomnia. Denies skin break down or rash.        Objective:   Physical Exam BP 104/74 mmHg  Pulse 102  Resp 16  Ht 5\' 6"  (1.676 m)  Wt 139 lb (63.05 kg)  BMI 22.45 kg/m2  SpO2 98% Patient alert and oriented and in no cardiopulmonary distress.  HEENT: No facial asymmetry, EOMI,   oropharynx pink and moist.  Neck supple no JVD, no mass.  Chest: Clear to auscultation bilaterally.decreased air entry throughout  CVS: S1, S2 no murmurs, no S3.Regular rate.  ABD: Soft non tender.   Ext: No edema  MS: Adequate ROM spine, shoulders, hips and knees.  Skin: Intact, no ulcerations or rash noted.  Psych:Fairly Good eye contact, anxious affect. Memory intact   CNS: CN 2-12 intact, power,  normal throughout.no focal deficits noted.        Assessment & Plan:  Hospital discharge follow-up recentr 2 day hospitalization for COPD flare , imprioved, just completing 10 mg  taper, will maintain him on prednisone 5 mg daily for next 2 month, will need ulcer prophylaxis also  Alcohol addiction Ptoent initally refused consideration of out pt rehab, stating he "can do this himself" and that he had been to rehab in the past. At end of visit, he agreed to reah out to the [facility] in [place] fo help, I hope that he indeed follows through. Ativan prescribed in the hospital was not refilled  COPD exacerbation (HCC) Now resolved, however ot to be maintained on low dose prednisone daily for next 2 months  Essential hypertension Controlled, no change in medication DASH diet and commitment to daily physical activity for a minimum of 30 minutes discussed and encouraged, as a part of hypertension management. The importance of attaining a healthy weight is also discussed.  BP/Weight 03/02/2016 02/23/2016 02/21/2016 12/29/2015 11/01/2015 10/29/2015 09/21/2015  Systolic BP 104 130 - 112 118 [id number] 120  Diastolic BP 74 66 - 74 76 77 88  Wt. (Lbs) 139 - 128 135 134.4 143 128  BMI 22.45 - 20.67 21.8 21.7 23.09 17.36        Depression with anxiety Controlled, no change in medication

## 2016-03-02 NOTE — Assessment & Plan Note (Signed)
Ptoent initally refused consideration of out pt rehab, stating he "can do this himself" and that he had been to rehab in the past. At end of visit, he agreed to reah out to the [facility] in [place] fo help, I hope that he indeed follows through. Ativan prescribed in the hospital was not refilled

## 2016-03-02 NOTE — Assessment & Plan Note (Signed)
recentr 2 day hospitalization for COPD flare , imprioved, just completing 10 mg taper, will maintain him on prednisone 5 mg daily for next 2 month, will need ulcer prophylaxis also

## 2016-03-02 NOTE — Patient Instructions (Signed)
F/u in April as before  New is once daily prednisone to help breathing  Call [facility] in [place] for help with stopping alcohol, excellent treatment program Number is  [phone]

## 2016-03-13 ENCOUNTER — Other Ambulatory Visit: Payer: Self-pay | Admitting: Family Medicine

## 2016-03-29 ENCOUNTER — Encounter: Payer: Self-pay | Admitting: Family Medicine

## 2016-03-29 ENCOUNTER — Ambulatory Visit (INDEPENDENT_AMBULATORY_CARE_PROVIDER_SITE_OTHER): Payer: BLUE CROSS/BLUE SHIELD | Admitting: Family Medicine

## 2016-03-29 VITALS — BP 115/80 | HR 73 | Resp 16 | Ht 66.0 in | Wt 141.0 lb

## 2016-03-29 DIAGNOSIS — E559 Vitamin D deficiency, unspecified: Secondary | ICD-10-CM

## 2016-03-29 DIAGNOSIS — J449 Chronic obstructive pulmonary disease, unspecified: Secondary | ICD-10-CM

## 2016-03-29 DIAGNOSIS — R636 Underweight: Secondary | ICD-10-CM

## 2016-03-29 DIAGNOSIS — J432 Centrilobular emphysema: Secondary | ICD-10-CM

## 2016-03-29 DIAGNOSIS — I10 Essential (primary) hypertension: Secondary | ICD-10-CM | POA: Diagnosis not present

## 2016-03-29 DIAGNOSIS — J45909 Unspecified asthma, uncomplicated: Secondary | ICD-10-CM

## 2016-03-29 DIAGNOSIS — F17218 Nicotine dependence, cigarettes, with other nicotine-induced disorders: Secondary | ICD-10-CM | POA: Diagnosis not present

## 2016-03-29 DIAGNOSIS — F418 Other specified anxiety disorders: Secondary | ICD-10-CM | POA: Diagnosis not present

## 2016-03-29 DIAGNOSIS — F10288 Alcohol dependence with other alcohol-induced disorder: Secondary | ICD-10-CM

## 2016-03-29 MED ORDER — AMLODIPINE BESYLATE 10 MG PO TABS: 10.0000 mg | ORAL_TABLET | Freq: Every day | ORAL | Status: DC

## 2016-03-29 MED ORDER — CITALOPRAM HYDROBROMIDE 20 MG PO TABS: 20.0000 mg | ORAL_TABLET | Freq: Every day | ORAL | Status: DC

## 2016-03-29 MED ORDER — ERGOCALCIFEROL 1.25 MG (50000 UT) PO CAPS: 50000.0000 [IU] | ORAL_CAPSULE | ORAL | Status: DC

## 2016-03-29 NOTE — Assessment & Plan Note (Signed)
Continue weekly vit D, recheck lab in 5 month

## 2016-03-29 NOTE — Patient Instructions (Signed)
F/u mid to end August, call if you need me sooner  Stop prednisone once you finish current set  Pls continue to work on stopping smoking and drinking to improve your health  Thank you  for choosing [facility]. We consider it a privelige to serve you.  Delivering excellent health care in a caring and  compassionate way is our goal.  Partnering with you,  so that together we can achieve this goal is our strategy.

## 2016-03-29 NOTE — Assessment & Plan Note (Signed)
Controlled, no change in medication  

## 2016-03-29 NOTE — Assessment & Plan Note (Signed)
Progressively worsening due to ongoing nicotine use. Encouraged pt to quit smoking

## 2016-03-29 NOTE — Progress Notes (Signed)
   Subjective:    Patient ID: [name], male    DOB: August 23, 1956, 60 y.o.   MRN: [id number]  HPI   [name]     MRN: [id number]      DOB: August 23, 1956   HPI [name] is here for follow up and re-evaluation of chronic medical conditions, medication management and review of any available recent lab and radiology data.  Preventive health is updated, specifically  Cancer screening and Immunization.   Has not reached out for outpatient rehab The PT denies any adverse reactions to current medications since the last visit.  There are no new concerns.  There are no specific complaints   ROS Denies recent fever or chills. Denies sinus pressure, nasal congestion, ear pain or sore throat. Denies chest congestion, productive cough or wheezing. Denies chest pains, palpitations and leg swelling Denies abdominal pain, nausea, vomiting,diarrhea or constipation.   Denies dysuria, frequency, hesitancy or incontinence. Denies joint pain, swelling and limitation in mobility. Denies headaches, seizures, numbness, or tingling. Denies depression, anxiety or insomnia. Denies skin break down or rash.   PE  BP 115/80 mmHg  Pulse 73  Resp 16  Ht 5\' 6"  (1.676 m)  Wt 141 lb (63.957 kg)  BMI 22.77 kg/m2  SpO2 96%  Patient alert and oriented and in no cardiopulmonary distress.  HEENT: No facial asymmetry, EOMI,   oropharynx pink and moist.  Neck supple no JVD, no mass.  Decreased though adequate air entry bilaterally  CVS: S1, S2 no murmurs, no S3.Regular rate.  ABD: Soft non tender.   Ext: No edema  MS: Adequate ROM spine, shoulders, hips and knees.  Skin: Intact, no ulcerations or rash noted.  Psych: Good eye contact, normal affect. Memory intact not anxious or depressed appearing.  CNS: CN 2-12 intact, power,  normal throughout.no focal deficits noted.   Assessment & Plan   Essential hypertension Controlled, no change in medication .  Underweight Continue  daily megace  Depression with anxiety .Controlled, no change in medication   Nicotine dependence, cigarettes, with other nicotine-induced disorders Patient counseled for approximately 5 minutes regarding the health risks of ongoing nicotine use, specifically all types of cancer, heart disease, stroke and respiratory failure. The options available for help with cessation ,the behavioral changes to assist the process, and the option to either gradully reduce usage  Or abruptly stop.is also discussed. Pt is also encouraged to set specific goals in number of cigarettes used daily, as well as to set a quit date.  Number of cigarettes/cigars currently smoking daily: 5   Asthma with COPD Controlled, no change in medication   Centrilobular emphysema (HCC) Progressively worsening due to ongoing nicotine use. Encouraged pt to quit smoking  Alcohol addiction Still drinking regularly, did not reach out for help to outpatient rehab, states he is working on this on his own  Encouraged him again to get help with this  Vitamin D deficiency Continue weekly vit D, recheck lab in 5 month       Review of Systems     Objective:   Physical Exam        Assessment & Plan:

## 2016-03-29 NOTE — Assessment & Plan Note (Signed)

## 2016-03-29 NOTE — Assessment & Plan Note (Signed)
Still drinking regularly, did not reach out for help to outpatient rehab, states he is working on this on his own  Encouraged him again to get help with this

## 2016-03-29 NOTE — Assessment & Plan Note (Signed)
Continue daily megace

## 2016-04-19 ENCOUNTER — Other Ambulatory Visit: Payer: Self-pay | Admitting: Family Medicine

## 2016-05-17 ENCOUNTER — Other Ambulatory Visit: Payer: Self-pay | Admitting: Family Medicine

## 2016-06-07 ENCOUNTER — Other Ambulatory Visit: Payer: Self-pay | Admitting: Family Medicine

## 2016-08-01 ENCOUNTER — Ambulatory Visit (INDEPENDENT_AMBULATORY_CARE_PROVIDER_SITE_OTHER): Payer: BLUE CROSS/BLUE SHIELD | Admitting: Family Medicine

## 2016-08-01 ENCOUNTER — Encounter: Payer: Self-pay | Admitting: Family Medicine

## 2016-08-01 VITALS — BP 140/88 | HR 100 | Resp 16 | Ht 66.0 in | Wt 137.0 lb

## 2016-08-01 DIAGNOSIS — R636 Underweight: Secondary | ICD-10-CM

## 2016-08-01 DIAGNOSIS — J441 Chronic obstructive pulmonary disease with (acute) exacerbation: Secondary | ICD-10-CM

## 2016-08-01 DIAGNOSIS — E441 Mild protein-calorie malnutrition: Secondary | ICD-10-CM | POA: Diagnosis not present

## 2016-08-01 DIAGNOSIS — F411 Generalized anxiety disorder: Secondary | ICD-10-CM | POA: Insufficient documentation

## 2016-08-01 DIAGNOSIS — Z125 Encounter for screening for malignant neoplasm of prostate: Secondary | ICD-10-CM

## 2016-08-01 DIAGNOSIS — F1010 Alcohol abuse, uncomplicated: Secondary | ICD-10-CM

## 2016-08-01 DIAGNOSIS — F418 Other specified anxiety disorders: Secondary | ICD-10-CM

## 2016-08-01 DIAGNOSIS — I10 Essential (primary) hypertension: Secondary | ICD-10-CM

## 2016-08-01 DIAGNOSIS — F17218 Nicotine dependence, cigarettes, with other nicotine-induced disorders: Secondary | ICD-10-CM

## 2016-08-01 MED ORDER — ALBUTEROL SULFATE HFA 108 (90 BASE) MCG/ACT IN AERS: INHALATION_SPRAY | RESPIRATORY_TRACT | 5 refills | Status: DC

## 2016-08-01 MED ORDER — BUDESONIDE-FORMOTEROL FUMARATE 160-4.5 MCG/ACT IN AERO: 2.0000 | INHALATION_SPRAY | Freq: Two times a day (BID) | RESPIRATORY_TRACT | 5 refills | Status: DC

## 2016-08-01 MED ORDER — BUSPIRONE HCL 5 MG PO TABS: 5.0000 mg | ORAL_TABLET | Freq: Three times a day (TID) | ORAL | 4 refills | Status: DC

## 2016-08-01 MED ORDER — MEGESTROL ACETATE 20 MG PO TABS
20.0000 mg | ORAL_TABLET | Freq: Every day | ORAL | 5 refills | Status: DC
Start: 2016-08-01 — End: 2017-04-13

## 2016-08-01 MED ORDER — IPRATROPIUM-ALBUTEROL 0.5-2.5 (3) MG/3ML IN SOLN: 3.0000 mL | Freq: Four times a day (QID) | RESPIRATORY_TRACT | 3 refills | Status: DC | PRN

## 2016-08-01 NOTE — Patient Instructions (Addendum)
Annual physical exam in 3 month, call if you need me before  Continue to work on getting along with people, and not letting them get on your nerves  New additional medication buspar three times daily for anxiety   Please work on stopping smoking   Generalized Anxiety Disorder Generalized anxiety disorder (GAD) is a mental disorder. It interferes with life functions, including relationships, work, and school. GAD is different from normal anxiety, which everyone experiences at some point in their lives in response to specific life events and activities. Normal anxiety actually helps us prepare for and get through these life events and activities. Normal anxiety goes away after the event or activity is over.  GAD causes anxiety that is not necessarily related to specific events or activities. It also causes excess anxiety in proportion to specific events or activities. The anxiety associated with GAD is also difficult to control. GAD can vary from mild to severe. People with severe GAD can have intense waves of anxiety with physical symptoms (panic attacks).  SYMPTOMS The anxiety and worry associated with GAD are difficult to control. This anxiety and worry are related to many life events and activities and also occur more days than not for 6 months or longer. People with GAD also have three or more of the following symptoms (one or more in children):  Restlessness.   Fatigue.  Difficulty concentrating.   Irritability.  Muscle tension.  Difficulty sleeping or unsatisfying sleep. DIAGNOSIS GAD is diagnosed through an assessment by your health care provider. Your health care provider will ask you questions aboutyour mood,physical symptoms, and events in your life. Your health care provider may ask you about your medical history and use of alcohol or drugs, including prescription medicines. Your health care provider may also do a physical exam and blood tests. Certain medical conditions and  the use of certain substances can cause symptoms similar to those associated with GAD. Your health care provider may refer you to a mental health specialist for further evaluation. TREATMENT The following therapies are usually used to treat GAD:   Medication. Antidepressant medication usually is prescribed for long-term daily control. Antianxiety medicines may be added in severe cases, especially when panic attacks occur.   Talk therapy (psychotherapy). Certain types of talk therapy can be helpful in treating GAD by providing support, education, and guidance. A form of talk therapy called cognitive behavioral therapy can teach you healthy ways to think about and react to daily life events and activities.  Stress managementtechniques. These include yoga, meditation, and exercise and can be very helpful when they are practiced regularly. A mental health specialist can help determine which treatment is best for you. Some people see improvement with one therapy. However, other people require a combination of therapies.   This information is not intended to replace advice given to you by your health care provider. Make sure you discuss any questions you have with your health care provider.   Document Released: 03/30/2013 Document Revised: 12/24/2014 Document Reviewed: 03/30/2013 Elsevier Interactive Patient Education [employer]2016 [employer].

## 2016-08-04 NOTE — Assessment & Plan Note (Signed)
Unchanged, counseled to d/c alcohol use

## 2016-08-04 NOTE — Assessment & Plan Note (Signed)
Uncontrolled inc dose of buspar

## 2016-08-04 NOTE — Assessment & Plan Note (Signed)
Sub optimal control, needs to stop smoking and reduce salt intake no med change DASH diet and commitment to daily physical activity for a minimum of 30 minutes discussed and encouraged, as a part of hypertension management. The importance of attaining a healthy weight is also discussed.  BP/Weight 08/01/2016 03/29/2016 03/02/2016 02/23/2016 02/21/2016 12/29/2015 11/01/2015  Systolic BP 140 115 104 130 - [id number] 118  Diastolic BP 88 80 74 66 - 74 76  Wt. (Lbs) 137 141 139 - 128 135 134.4  BMI 22.11 22.77 22.45 - 20.67 21.8 21.7

## 2016-08-04 NOTE — Progress Notes (Signed)
   [name]     MRN: [id number]      DOB: [birth year]   HPI [name] is here for follow up and re-evaluation of chronic medical conditions, medication management and review of any available recent lab and radiology data.  Preventive health is updated, specifically  Cancer screening and Immunization.   Questions or concerns regarding consultations or procedures which the PT has had in the interim are  addressed. The PT denies any adverse reactions to current medications since the last visit.  ROS Denies recent fever or chills. Denies sinus pressure, nasal congestion, ear pain or sore throat. Denies chest congestion, productive cough or wheezing. Denies chest pains, palpitations and leg swelling Denies abdominal pain, nausea, vomiting,diarrhea or constipation.   Denies dysuria, frequency, hesitancy or incontinence. Denies joint pain, swelling and limitation in mobility. Denies headaches, seizures, numbness, or tingling. C/o increased and uncontrolled anxiety, everyone "gets on his nerves" noy suicidal or homicidal. Denies skin break down or rash.   PE  BP 140/88   Pulse 100   Resp 16   Ht 5\' 6"  (1.676 m)   Wt 137 lb (62.1 kg)   SpO2 96%   BMI 22.11 kg/m   Patient alert and oriented and in no cardiopulmonary distress.  HEENT: No facial asymmetry, EOMI,   oropharynx pink and moist.  Neck supple no JVD, no mass.  Chest: Clear to auscultation bilaterally.decrreased air entry throughout  CVS: S1, S2 no murmurs, no S3.Regular rate.  ABD: Soft non tender.   Ext: No edema  MS: Adequate ROM spine, shoulders, hips and knees.  Skin: Intact, no ulcerations or rash noted.  Psych: Good eye contact, normal affect. Memory intact  anxious not  depressed appearing.  CNS: CN 2-12 intact, power,  normal throughout.no focal deficits noted.   Assessment & Plan  Essential hypertension Sub optimal control, needs to stop smoking and reduce salt intake no med change DASH diet  and commitment to daily physical activity for a minimum of 30 minutes discussed and encouraged, as a part of hypertension management. The importance of attaining a healthy weight is also discussed.  BP/Weight 08/01/2016 03/29/2016 03/02/2016 02/23/2016 02/21/2016 12/29/2015 11/01/2015  Systolic BP 140 115 104 130 - 213112 118  Diastolic BP 88 80 74 66 - 74 76  Wt. (Lbs) 137 141 139 - 128 135 134.4  BMI 22.11 22.77 22.45 - 20.67 21.8 21.7       Nicotine dependence, cigarettes, with other nicotine-induced disorders Patient counseled for approximately 5 minutes regarding the health risks of ongoing nicotine use, specifically all types of cancer, heart disease, stroke and respiratory failure. The options available for help with cessation ,the behavioral changes to assist the process, and the option to either gradully reduce usage  Or abruptly stop.is also discussed. Pt is also encouraged to set specific goals in number of cigarettes used daily, as well as to set a quit date.     Depression with anxiety Depression controlled but anxiety is not, increase in dose of buspar  GAD (generalized anxiety disorder) Uncontrolled inc dose of buspar  Alcohol abuse Unchanged, counseled to d/c alcohol use  Underweight Unchanged despite megace, needs to reduce alcohol

## 2016-08-04 NOTE — Assessment & Plan Note (Signed)
Unchanged despite megace, needs to reduce alcohol

## 2016-08-04 NOTE — Assessment & Plan Note (Signed)

## 2016-08-04 NOTE — Assessment & Plan Note (Signed)
Depression controlled but anxiety is not, increase in dose of buspar

## 2016-10-13 ENCOUNTER — Other Ambulatory Visit: Payer: Self-pay | Admitting: Family Medicine

## 2016-10-17 ENCOUNTER — Other Ambulatory Visit: Payer: Self-pay | Admitting: Family Medicine

## 2016-11-22 LAB — LIPID PANEL
CHOL/HDL RATIO: 1.9 ratio (ref <5.0)
Cholesterol: 166 mg/dL (ref <200)
HDL: 87 mg/dL (ref >40)
LDL CALC: 58 mg/dL (ref <100)
TRIGLYCERIDES: 105 mg/dL (ref <150)
VLDL: 21 mg/dL (ref <30)

## 2016-11-22 LAB — TSH: TSH: 2.5 mIU/L (ref 0.40–4.50)

## 2016-11-22 LAB — PSA: PSA: 1.7 ng/mL (ref <=4.0)

## 2016-11-28 ENCOUNTER — Other Ambulatory Visit: Payer: Self-pay | Admitting: Family Medicine

## 2016-12-05 ENCOUNTER — Encounter: Payer: Self-pay | Admitting: Family Medicine

## 2016-12-05 ENCOUNTER — Ambulatory Visit (INDEPENDENT_AMBULATORY_CARE_PROVIDER_SITE_OTHER): Payer: BLUE CROSS/BLUE SHIELD | Admitting: Family Medicine

## 2016-12-05 VITALS — BP 116/70 | HR 88 | Temp 98.5°F | Resp 20 | Ht 70.0 in | Wt 136.0 lb

## 2016-12-05 DIAGNOSIS — Z0000 Encounter for general adult medical examination without abnormal findings: Secondary | ICD-10-CM

## 2016-12-05 DIAGNOSIS — Z23 Encounter for immunization: Secondary | ICD-10-CM | POA: Diagnosis not present

## 2016-12-05 DIAGNOSIS — Z87891 Personal history of nicotine dependence: Secondary | ICD-10-CM

## 2016-12-05 DIAGNOSIS — I10 Essential (primary) hypertension: Secondary | ICD-10-CM | POA: Diagnosis not present

## 2016-12-05 DIAGNOSIS — E559 Vitamin D deficiency, unspecified: Secondary | ICD-10-CM

## 2016-12-05 DIAGNOSIS — F17218 Nicotine dependence, cigarettes, with other nicotine-induced disorders: Secondary | ICD-10-CM

## 2016-12-05 MED ORDER — BUSPIRONE HCL 5 MG PO TABS: ORAL_TABLET | ORAL | 5 refills | Status: DC

## 2016-12-05 NOTE — Assessment & Plan Note (Signed)

## 2016-12-05 NOTE — Assessment & Plan Note (Signed)

## 2016-12-05 NOTE — Assessment & Plan Note (Signed)
After obtaining informed consent, the vaccine is  administered by LPN.  

## 2016-12-05 NOTE — Progress Notes (Signed)
   Noel Geroldrnest D Teat     MRN: [id number]      DOB: Dec 25, 1955   HPI: Patient is in for annual physical exam. No other health concerns are expressed or addressed at the visit. Recent labs, if available are reviewed. Immunization is reviewed , and  updated if needed.    PE;  BP 116/70 (BP Location: Left Arm, Patient Position: Sitting, Cuff Size: Normal)   Pulse 88   Temp 98.5 F (36.9 C) (Oral)   Resp 20   Ht 5\' 10"  (1.778 m)   Wt 136 lb 0.6 oz (61.7 kg)   SpO2 94%   BMI 19.52 kg/m   Pleasant male, alert and oriented x 3, in no cardio-pulmonary distress. Afebrile. HEENT No facial trauma or asymetry. Sinuses non tender. EOMI, pupils equally reactive to light. External ears normal, tympanic membranes clear. Oropharynx moist, no exudate. Neck: supple, no adenopathy,JVD or thyromegaly.No bruits.  Chest: Clear to ascultation bilaterally.No crackles or wheezes. Non tender to palpation  Breast: No asymetry,no masses. No nipple discharge or inversion. No axillary or supraclavicular adenopathy  Cardiovascular system; Heart sounds normal,  S1 and  S2 ,no S3.  No murmur, or thrill. Apical beat not displaced Peripheral pulses normal.  Abdomen: Soft, non tender, no organomegaly or masses. No bruits. Bowel sounds normal. No guarding, tenderness or rebound.  Rectal:  Normal sphincter tone. No hemorrhoids or  masses. guaiac negative stool. Prostate smooth and firm    Musculoskeletal exam: Full ROM of spine, hips , shoulders and knees. No deformity ,swelling or crepitus noted. No muscle wasting or atrophy.   Neurologic: Cranial nerves 2 to 12 intact. Power, tone ,sensation and reflexes normal throughout. No disturbance in gait. No tremor.  Skin: Intact, no ulceration, erythema , scaling or rash noted. Pigmentation normal throughout  Psych; Normal mood and affect. Judgement and concentration normal   Assessment & Plan:  Annual physical exam Annual exam as  documented. Counseling done  re healthy lifestyle involving commitment to 150 minutes exercise per week, heart healthy diet, and attaining healthy weight.The importance of adequate sleep also discussed. Regular seat belt use and home safety, is also discussed. Changes in health habits are decided on by the patient with goals and time frames  set for achieving them. Immunization and cancer screening needs are specifically addressed at this visit.   Need for prophylactic vaccination and inoculation against influenza After obtaining informed consent, the vaccine is  administered by LPN.   Nicotine dependence, cigarettes, with other nicotine-induced disorders Patient counseled for approximately 5 minutes regarding the health risks of ongoing nicotine use, specifically all types of cancer, heart disease, stroke and respiratory failure. The options available for help with cessation ,the behavioral changes to assist the process, and the option to either gradully reduce usage  Or abruptly stop.is also discussed. Pt is also encouraged to set specific goals in number of cigarettes used daily, as well as to set a quit date.

## 2016-12-05 NOTE — Patient Instructions (Addendum)
F/u in 4 month, call if you need me sooner  Flu vaccine today  You are referred for screening chest scan  Non fasting labs 1 week before next visit please  Work on stopping smoking, you NEED to QUIT  Take citalopram and buspar ONCE daily for anxiety and depression       Steps to Quit Smoking Smoking tobacco can be bad for your health. It can also affect almost every organ in your body. Smoking puts you and people around you at risk for many serious long-lasting (chronic) diseases. Quitting smoking is hard, but it is one of the best things that you can do for your health. It is never too late to quit. What are the benefits of quitting smoking? When you quit smoking, you lower your risk for getting serious diseases and conditions. They can include:  Lung cancer or lung disease.  Heart disease.  Stroke.  Heart attack.  Not being able to have children (infertility).  Weak bones (osteoporosis) and broken bones (fractures). If you have coughing, wheezing, and shortness of breath, those symptoms may get better when you quit. You may also get sick less often. If you are pregnant, quitting smoking can help to lower your chances of having a baby of low birth weight. What can I do to help me quit smoking? Talk with your doctor about what can help you quit smoking. Some things you can do (strategies) include:  Quitting smoking totally, instead of slowly cutting back how much you smoke over a period of time.  Going to in-person counseling. You are more likely to quit if you go to many counseling sessions.  Using resources and support systems, such as:  Online chats with a Veterinary surgeoncounselor.  Phone quitlines.  Printed Materials engineerself-help materials.  Support groups or group counseling.  Text messaging programs.  Mobile phone apps or applications.  Taking medicines. Some of these medicines may have nicotine in them. If you are pregnant or breastfeeding, do not take any medicines to quit smoking  unless your doctor says it is okay. Talk with your doctor about counseling or other things that can help you. Talk with your doctor about using more than one strategy at the same time, such as taking medicines while you are also going to in-person counseling. This can help make quitting easier. What things can I do to make it easier to quit? Quitting smoking might feel very hard at first, but there is a lot that you can do to make it easier. Take these steps:  Talk to your family and friends. Ask them to support and encourage you.  Call phone quitlines, reach out to support groups, or work with a Veterinary surgeoncounselor.  Ask people who smoke to not smoke around you.  Avoid places that make you want (trigger) to smoke, such as:  Bars.  Parties.  Smoke-break areas at work.  Spend time with people who do not smoke.  Lower the stress in your life. Stress can make you want to smoke. Try these things to help your stress:  Getting regular exercise.  Deep-breathing exercises.  Yoga.  Meditating.  Doing a body scan. To do this, close your eyes, focus on one area of your body at a time from head to toe, and notice which parts of your body are tense. Try to relax the muscles in those areas.  Download or buy apps on your mobile phone or tablet that can help you stick to your quit plan. There are many free apps,  such as QuitGuide from the [employer] developer for Disease Control and Prevention). You can find more support from smokefree.gov and other websites. This information is not intended to replace advice given to you by your health care provider. Make sure you discuss any questions you have with your health care provider. Document Released: 09/29/2009 Document Revised: 07/31/2016 Document Reviewed: 04/19/2015 Elsevier Interactive Patient Education  2017 [employer].  Steps to Quit Smoking Smoking tobacco can be bad for your health. It can also affect almost every organ in your body. Smoking puts you and  people around you at risk for many serious long-lasting (chronic) diseases. Quitting smoking is hard, but it is one of the best things that you can do for your health. It is never too late to quit. What are the benefits of quitting smoking? When you quit smoking, you lower your risk for getting serious diseases and conditions. They can include:  Lung cancer or lung disease.  Heart disease.  Stroke.  Heart attack.  Not being able to have children (infertility).  Weak bones (osteoporosis) and broken bones (fractures). If you have coughing, wheezing, and shortness of breath, those symptoms may get better when you quit. You may also get sick less often. If you are pregnant, quitting smoking can help to lower your chances of having a baby of low birth weight. What can I do to help me quit smoking? Talk with your doctor about what can help you quit smoking. Some things you can do (strategies) include:  Quitting smoking totally, instead of slowly cutting back how much you smoke over a period of time.  Going to in-person counseling. You are more likely to quit if you go to many counseling sessions.  Using resources and support systems, such as:  Online chats with a Veterinary surgeon.  Phone quitlines.  Printed Materials engineer.  Support groups or group counseling.  Text messaging programs.  Mobile phone apps or applications.  Taking medicines. Some of these medicines may have nicotine in them. If you are pregnant or breastfeeding, do not take any medicines to quit smoking unless your doctor says it is okay. Talk with your doctor about counseling or other things that can help you. Talk with your doctor about using more than one strategy at the same time, such as taking medicines while you are also going to in-person counseling. This can help make quitting easier. What things can I do to make it easier to quit? Quitting smoking might feel very hard at first, but there is a lot that you can  do to make it easier. Take these steps:  Talk to your family and friends. Ask them to support and encourage you.  Call phone quitlines, reach out to support groups, or work with a Veterinary surgeon.  Ask people who smoke to not smoke around you.  Avoid places that make you want (trigger) to smoke, such as:  Bars.  Parties.  Smoke-break areas at work.  Spend time with people who do not smoke.  Lower the stress in your life. Stress can make you want to smoke. Try these things to help your stress:  Getting regular exercise.  Deep-breathing exercises.  Yoga.  Meditating.  Doing a body scan. To do this, close your eyes, focus on one area of your body at a time from head to toe, and notice which parts of your body are tense. Try to relax the muscles in those areas.  Download or buy apps on your mobile phone or tablet that can  help you stick to your quit plan. There are many free apps, such as QuitGuide from the [employer] developer(Centers for Disease Control and Prevention). You can find more support from smokefree.gov and other websites. This information is not intended to replace advice given to you by your health care provider. Make sure you discuss any questions you have with your health care provider. Document Released: 09/29/2009 Document Revised: 07/31/2016 Document Reviewed: 04/19/2015 Elsevier Interactive Patient Education  2017 [employer]

## 2016-12-06 ENCOUNTER — Other Ambulatory Visit: Payer: Self-pay

## 2016-12-06 MED ORDER — PREDNISONE 5 MG PO TABS: 5.0000 mg | ORAL_TABLET | Freq: Every day | ORAL | 0 refills | Status: DC

## 2016-12-06 MED ORDER — CITALOPRAM HYDROBROMIDE 20 MG PO TABS: 20.0000 mg | ORAL_TABLET | Freq: Every day | ORAL | 5 refills | Status: DC

## 2016-12-06 MED ORDER — AMLODIPINE BESYLATE 10 MG PO TABS: 10.0000 mg | ORAL_TABLET | Freq: Every day | ORAL | 5 refills | Status: DC

## 2016-12-20 ENCOUNTER — Ambulatory Visit (HOSPITAL_COMMUNITY): Admission: RE | Admit: 2016-12-20 | Payer: BLUE CROSS/BLUE SHIELD | Source: Ambulatory Visit

## 2017-01-07 ENCOUNTER — Other Ambulatory Visit: Payer: Self-pay

## 2017-01-07 DIAGNOSIS — J441 Chronic obstructive pulmonary disease with (acute) exacerbation: Secondary | ICD-10-CM

## 2017-01-07 MED ORDER — BUDESONIDE-FORMOTEROL FUMARATE 160-4.5 MCG/ACT IN AERO: 2.0000 | INHALATION_SPRAY | Freq: Two times a day (BID) | RESPIRATORY_TRACT | 5 refills | Status: DC

## 2017-01-07 MED ORDER — IPRATROPIUM-ALBUTEROL 0.5-2.5 (3) MG/3ML IN SOLN: 3.0000 mL | Freq: Four times a day (QID) | RESPIRATORY_TRACT | 3 refills | Status: DC | PRN

## 2017-04-09 LAB — CBC
HEMATOCRIT: 42.4 % (ref 38.5–50.0)
HEMOGLOBIN: 13.5 g/dL (ref 13.2–17.1)
MCH: 27.3 pg (ref 27.0–33.0)
MCHC: 31.8 g/dL — AB (ref 32.0–36.0)
MCV: 85.7 fL (ref 80.0–100.0)
MPV: 9.9 fL (ref 7.5–12.5)
Platelets: 313 10*3/uL (ref 140–400)
RBC: 4.95 MIL/uL (ref 4.20–5.80)
RDW: 13 % (ref 11.0–15.0)
WBC: 7.4 10*3/uL (ref 3.8–10.8)

## 2017-04-09 LAB — COMPREHENSIVE METABOLIC PANEL
ALBUMIN: 3.9 g/dL (ref 3.6–5.1)
ALK PHOS: 62 U/L (ref 40–115)
ALT: 15 U/L (ref 9–46)
AST: 16 U/L (ref 10–35)
BILIRUBIN TOTAL: 0.3 mg/dL (ref 0.2–1.2)
BUN: 18 mg/dL (ref 7–25)
CALCIUM: 9 mg/dL (ref 8.6–10.3)
CO2: 26 mmol/L (ref 20–31)
Chloride: 106 mmol/L (ref 98–110)
Creat: 1.05 mg/dL (ref 0.70–1.33)
Glucose, Bld: 92 mg/dL (ref 65–99)
POTASSIUM: 4.5 mmol/L (ref 3.5–5.3)
Sodium: 140 mmol/L (ref 135–146)
Total Protein: 6.6 g/dL (ref 6.1–8.1)

## 2017-04-10 LAB — VITAMIN D 25 HYDROXY (VIT D DEFICIENCY, FRACTURES): Vit D, 25-Hydroxy: 22 ng/mL — ABNORMAL LOW (ref 30–100)

## 2017-04-11 ENCOUNTER — Encounter: Payer: Self-pay | Admitting: Family Medicine

## 2017-04-11 ENCOUNTER — Ambulatory Visit (INDEPENDENT_AMBULATORY_CARE_PROVIDER_SITE_OTHER): Payer: Self-pay | Admitting: Family Medicine

## 2017-04-11 VITALS — BP 160/100 | HR 99 | Resp 16 | Ht 70.0 in | Wt 146.0 lb

## 2017-04-11 DIAGNOSIS — J432 Centrilobular emphysema: Secondary | ICD-10-CM

## 2017-04-11 DIAGNOSIS — F411 Generalized anxiety disorder: Secondary | ICD-10-CM

## 2017-04-11 DIAGNOSIS — R636 Underweight: Secondary | ICD-10-CM

## 2017-04-11 DIAGNOSIS — I10 Essential (primary) hypertension: Secondary | ICD-10-CM

## 2017-04-11 DIAGNOSIS — F418 Other specified anxiety disorders: Secondary | ICD-10-CM

## 2017-04-11 DIAGNOSIS — F10288 Alcohol dependence with other alcohol-induced disorder: Secondary | ICD-10-CM

## 2017-04-11 DIAGNOSIS — J441 Chronic obstructive pulmonary disease with (acute) exacerbation: Secondary | ICD-10-CM

## 2017-04-11 DIAGNOSIS — F17218 Nicotine dependence, cigarettes, with other nicotine-induced disorders: Secondary | ICD-10-CM

## 2017-04-11 MED ORDER — BUDESONIDE-FORMOTEROL FUMARATE 160-4.5 MCG/ACT IN AERO: 2.0000 | INHALATION_SPRAY | Freq: Two times a day (BID) | RESPIRATORY_TRACT | 3 refills | Status: DC

## 2017-04-11 MED ORDER — BUSPIRONE HCL 5 MG PO TABS: 5.0000 mg | ORAL_TABLET | Freq: Two times a day (BID) | ORAL | 3 refills | Status: DC

## 2017-04-11 MED ORDER — CITALOPRAM HYDROBROMIDE 20 MG PO TABS: 20.0000 mg | ORAL_TABLET | Freq: Every day | ORAL | 5 refills | Status: DC

## 2017-04-11 MED ORDER — IPRATROPIUM-ALBUTEROL 0.5-2.5 (3) MG/3ML IN SOLN: 3.0000 mL | Freq: Four times a day (QID) | RESPIRATORY_TRACT | 3 refills | Status: DC | PRN

## 2017-04-11 MED ORDER — TRIAMTERENE-HCTZ 37.5-25 MG PO TABS: 1.0000 | ORAL_TABLET | Freq: Every day | ORAL | 3 refills | Status: DC

## 2017-04-11 NOTE — Patient Instructions (Addendum)
f/u in 5.5 month, call if you need me before  Nurse bP check in 1 month.  pls fill maxzide for blood pressure and citalopram and buspar at [facility] each is $4  pls get symbicort, and nebulizing  solution and proventil inhaler [facility]  Please work on entoirely stopping smoking and drinking  All the best

## 2017-04-13 NOTE — Assessment & Plan Note (Signed)
Uncontrolled needs to resume affordable medication Start maxzide , nurse BP check in 4 weeks DASH diet and commitment to daily physical activity for a minimum of 30 minutes discussed and encouraged, as a part of hypertension management. The importance of attaining a healthy weight is also discussed.  BP/Weight 04/11/2017 12/05/2016 08/01/2016 03/29/2016 03/02/2016 02/23/2016 02/21/2016  Systolic BP 160 116 140 115 104 130 -  Diastolic BP 100 70 88 80 74 66 -  Wt. (Lbs) 146 136.04 137 141 139 - 128  BMI 20.95 19.52 22.11 22.77 22.45 - 20.67

## 2017-04-13 NOTE — Assessment & Plan Note (Addendum)
Needs to be maintained on chronic medication will be obtained through free prescription program, given info to get help through health dept for these medications

## 2017-04-13 NOTE — Assessment & Plan Note (Signed)
Improved , encouraged to refrain from EtOH and increase healthy nutrition

## 2017-04-13 NOTE — Assessment & Plan Note (Signed)
Continue medications and these will be affordable

## 2017-04-13 NOTE — Assessment & Plan Note (Signed)
Resume medication and may fill at [facility], prescription sent

## 2017-04-13 NOTE — Assessment & Plan Note (Signed)
counseled re need to quit drinking

## 2017-04-13 NOTE — Progress Notes (Signed)
[name]     MRN: [id number]      DOB: 1956-08-16   HPI [name] is here for follow up and re-evaluation of chronic medical conditions, medication management and review of any available recent lab and radiology data.  Currently stressed because he has been unemployed since January, alcoholism has been his main issue, states he does want to work. His sister is in with him, has questions as to whether he will be able to get disability, I have advised her of the channels o go through and I encourage him to attain sobriety and seek re employment in th interim States he is angry with previous employers he had worked there for 10 years , still wants to work, has no plan to harm either himself or anyone  Reports that he is drinking less and he has actually gained 10 pounds since last visit  Medications are reviewed, he is advised to fill affordable meds at [facility] as many cost $4 per month, his antihypertensive is changed from amlodipine to maxzide, and paperwork is completed for medication that he needs through the health dept free and info provided  ROS Denies recent fever or chills. Denies sinus pressure, nasal congestion, ear pain or sore throat. Denies chest congestion, productive cough or wheezing. Denies chest pains, palpitations and leg swelling Denies abdominal pain, nausea, vomiting,diarrhea or constipation.   Denies dysuria, frequency, hesitancy or incontinence. Denies joint pain, swelling and limitation in mobility. Denies headaches, seizures, numbness, or tingling. Denies uncontrolled depression, anxiety or insomnia. Denies skin break down or rash.   PE  BP (!) 160/100   Pulse 99   Resp 16   Ht  (1.778 m)   Wt 146 lb (66.2 kg)   SpO2 100%   BMI 20.95 kg/m   Patient alert and oriented and in no cardiopulmonary distress.  HEENT: No facial asymmetry, EOMI,   oropharynx pink and moist.  Neck supple no JVD, no mass.  Chest: Clear to auscultation  bilaterally.Decreased though adequate air entry  CVS: S1, S2 no murmurs, no S3.Regular rate.  ABD: Soft non tender.   Ext: No edema  MS: Adequate ROM spine, shoulders, hips and knees.  Skin: Intact, no ulcerations or rash noted.  Psych: Good eye contact, normal affect. Memory intact not anxious or depressed appearing.  CNS: CN 2-12 intact, power,  normal throughout.no focal deficits noted.   Assessment & Plan  Essential hypertension Uncontrolled needs to resume affordable medication Start maxzide , nurse BP check in 4 weeks DASH diet and commitment to daily physical activity for a minimum of 30 minutes discussed and encouraged, as a part of hypertension management. The importance of attaining a healthy weight is also discussed.  BP/Weight 04/11/2017 12/05/2016 08/01/2016 03/29/2016 03/02/2016 02/23/2016 02/21/2016  Systolic BP 160 116 140 115 104 130 -  Diastolic BP 100 70 88 80 74 66 -  Wt. (Lbs) 146 136.04 137 141 139 - 128  BMI 20.95 19.52 22.11 22.77 22.45 - 20.67       Depression with anxiety Resume medication and may fill at [facility], prescription sent  Alcohol addiction counseled re need to quit drinking  GAD (generalized anxiety disorder) Continue medications and these will be affordable  Nicotine dependence, cigarettes, with other nicotine-induced disorders Patient counseled for approximately 5 minutes regarding the health risks of ongoing nicotine use, specifically all types of cancer, heart disease, stroke and respiratory failure. The options available for help with cessation ,the behavioral changes to assist the  process, and the option to either gradully reduce usage  Or abruptly stop.is also discussed. Pt is also encouraged to set specific goals in number of cigarettes used daily, as well as to set a quit date.     Underweight Improved , encouraged to refrain from EtOH and increase healthy nutrition  Centrilobular emphysema (HCC) Needs to be maintained on  chronic medication will be obtained through free prescription program, given info to get help through health dept for these medications

## 2017-04-13 NOTE — Assessment & Plan Note (Signed)

## 2017-05-14 ENCOUNTER — Ambulatory Visit: Payer: Self-pay

## 2017-05-14 ENCOUNTER — Other Ambulatory Visit: Payer: Self-pay

## 2017-05-14 VITALS — BP 122/82

## 2017-05-14 DIAGNOSIS — I10 Essential (primary) hypertension: Secondary | ICD-10-CM

## 2017-05-14 DIAGNOSIS — J449 Chronic obstructive pulmonary disease, unspecified: Secondary | ICD-10-CM

## 2017-05-14 NOTE — Progress Notes (Signed)
Aware bp is now normal and was upset at his last visit and thinks that may be why it was abnormal. Referred to thn social worker for help with is medication and nebulizer machine. Will keep his next appt

## 2017-05-19 ENCOUNTER — Other Ambulatory Visit (HOSPITAL_COMMUNITY): Payer: Self-pay

## 2017-05-19 ENCOUNTER — Emergency Department (HOSPITAL_COMMUNITY): Payer: Medicaid Other

## 2017-05-19 ENCOUNTER — Inpatient Hospital Stay (HOSPITAL_COMMUNITY)
Admission: EM | Admit: 2017-05-19 | Discharge: 2017-05-22 | DRG: 191 | Disposition: A | Discharge status: Home / Self Care | Payer: Medicaid Other | Attending: Internal Medicine | Admitting: Internal Medicine

## 2017-05-19 ENCOUNTER — Other Ambulatory Visit: Payer: Self-pay

## 2017-05-19 ENCOUNTER — Encounter (HOSPITAL_COMMUNITY): Payer: Self-pay | Admitting: Emergency Medicine

## 2017-05-19 DIAGNOSIS — Z8249 Family history of ischemic heart disease and other diseases of the circulatory system: Secondary | ICD-10-CM

## 2017-05-19 DIAGNOSIS — Z888 Allergy status to other drugs, medicaments and biological substances status: Secondary | ICD-10-CM | POA: Diagnosis not present

## 2017-05-19 DIAGNOSIS — Z833 Family history of diabetes mellitus: Secondary | ICD-10-CM | POA: Diagnosis not present

## 2017-05-19 DIAGNOSIS — Z9114 Patient's other noncompliance with medication regimen: Secondary | ICD-10-CM | POA: Diagnosis not present

## 2017-05-19 DIAGNOSIS — F17210 Nicotine dependence, cigarettes, uncomplicated: Secondary | ICD-10-CM | POA: Diagnosis present

## 2017-05-19 DIAGNOSIS — R0602 Shortness of breath: Secondary | ICD-10-CM | POA: Diagnosis present

## 2017-05-19 DIAGNOSIS — I471 Supraventricular tachycardia, unspecified: Secondary | ICD-10-CM | POA: Diagnosis present

## 2017-05-19 DIAGNOSIS — J441 Chronic obstructive pulmonary disease with (acute) exacerbation: Secondary | ICD-10-CM | POA: Diagnosis present

## 2017-05-19 DIAGNOSIS — F418 Other specified anxiety disorders: Secondary | ICD-10-CM | POA: Diagnosis present

## 2017-05-19 DIAGNOSIS — I10 Essential (primary) hypertension: Secondary | ICD-10-CM | POA: Diagnosis present

## 2017-05-19 DIAGNOSIS — F1020 Alcohol dependence, uncomplicated: Secondary | ICD-10-CM | POA: Diagnosis present

## 2017-05-19 DIAGNOSIS — T380X5A Adverse effect of glucocorticoids and synthetic analogues, initial encounter: Secondary | ICD-10-CM | POA: Diagnosis not present

## 2017-05-19 DIAGNOSIS — Z7951 Long term (current) use of inhaled steroids: Secondary | ICD-10-CM

## 2017-05-19 DIAGNOSIS — I2510 Atherosclerotic heart disease of native coronary artery without angina pectoris: Secondary | ICD-10-CM | POA: Diagnosis present

## 2017-05-19 DIAGNOSIS — Z79899 Other long term (current) drug therapy: Secondary | ICD-10-CM | POA: Diagnosis not present

## 2017-05-19 DIAGNOSIS — F17218 Nicotine dependence, cigarettes, with other nicotine-induced disorders: Secondary | ICD-10-CM | POA: Diagnosis present

## 2017-05-19 DIAGNOSIS — Z7982 Long term (current) use of aspirin: Secondary | ICD-10-CM

## 2017-05-19 DIAGNOSIS — J9601 Acute respiratory failure with hypoxia: Secondary | ICD-10-CM

## 2017-05-19 DIAGNOSIS — D72829 Elevated white blood cell count, unspecified: Secondary | ICD-10-CM | POA: Diagnosis not present

## 2017-05-19 LAB — CBC WITH DIFFERENTIAL/PLATELET
BASOS ABS: 0.1 10*3/uL (ref 0.0–0.1)
BASOS PCT: 1 %
EOS PCT: 16 %
Eosinophils Absolute: 1.2 10*3/uL — ABNORMAL HIGH (ref 0.0–0.7)
HCT: 48.5 % (ref 39.0–52.0)
Hemoglobin: 16.2 g/dL (ref 13.0–17.0)
Lymphocytes Relative: 29 %
Lymphs Abs: 2.2 10*3/uL (ref 0.7–4.0)
MCH: 29.1 pg (ref 26.0–34.0)
MCHC: 33.4 g/dL (ref 30.0–36.0)
MCV: 87.2 fL (ref 78.0–100.0)
MONO ABS: 1 10*3/uL (ref 0.1–1.0)
Monocytes Relative: 13 %
Neutro Abs: 3.2 10*3/uL (ref 1.7–7.7)
Neutrophils Relative %: 41 %
PLATELETS: 253 10*3/uL (ref 150–400)
RBC: 5.56 MIL/uL (ref 4.22–5.81)
RDW: 12.9 % (ref 11.5–15.5)
WBC: 7.7 10*3/uL (ref 4.0–10.5)

## 2017-05-19 LAB — BASIC METABOLIC PANEL
ANION GAP: 13 (ref 5–15)
BUN: 8 mg/dL (ref 6–20)
CALCIUM: 9.9 mg/dL (ref 8.9–10.3)
CO2: 29 mmol/L (ref 22–32)
Chloride: 101 mmol/L (ref 101–111)
Creatinine, Ser: 1.02 mg/dL (ref 0.61–1.24)
GLUCOSE: 119 mg/dL — AB (ref 65–99)
POTASSIUM: 4.2 mmol/L (ref 3.5–5.1)
SODIUM: 143 mmol/L (ref 135–145)

## 2017-05-19 LAB — ETHANOL

## 2017-05-19 MED ORDER — ALBUTEROL SULFATE (2.5 MG/3ML) 0.083% IN NEBU
5.0000 mg | INHALATION_SOLUTION | Freq: Once | RESPIRATORY_TRACT | Status: AC
  Administered 2017-05-19: 5 mg via RESPIRATORY_TRACT
  Filled 2017-05-19: qty 6

## 2017-05-19 MED ORDER — LEVALBUTEROL HCL 0.63 MG/3ML IN NEBU
INHALATION_SOLUTION | RESPIRATORY_TRACT | Status: AC
  Administered 2017-05-19: 0.63 mg
  Filled 2017-05-19: qty 3

## 2017-05-19 MED ORDER — SODIUM CHLORIDE 0.9% FLUSH
3.0000 mL | Freq: Two times a day (BID) | INTRAVENOUS | Status: DC
  Administered 2017-05-20 – 2017-05-21 (×2): 3 mL via INTRAVENOUS

## 2017-05-19 MED ORDER — CITALOPRAM HYDROBROMIDE 20 MG PO TABS
20.0000 mg | ORAL_TABLET | Freq: Every day | ORAL | Status: DC
  Administered 2017-05-20 – 2017-05-22 (×3): 20 mg via ORAL
  Filled 2017-05-19 (×3): qty 1

## 2017-05-19 MED ORDER — ENOXAPARIN SODIUM 40 MG/0.4ML ~~LOC~~ SOLN
40.0000 mg | SUBCUTANEOUS | Status: DC
  Administered 2017-05-19 – 2017-05-21 (×3): 40 mg via SUBCUTANEOUS
  Filled 2017-05-19 (×3): qty 0.4

## 2017-05-19 MED ORDER — DEXTROSE 5 % IV SOLN
500.0000 mg | INTRAVENOUS | Status: DC
  Administered 2017-05-19 – 2017-05-21 (×3): 500 mg via INTRAVENOUS
  Filled 2017-05-19 (×4): qty 500

## 2017-05-19 MED ORDER — ONDANSETRON HCL 4 MG PO TABS: 4.0000 mg | ORAL_TABLET | Freq: Four times a day (QID) | ORAL | Status: DC | PRN

## 2017-05-19 MED ORDER — TRIAMTERENE-HCTZ 37.5-25 MG PO TABS
ORAL_TABLET | ORAL | Status: AC
Start: 2017-05-19 — End: ?
  Filled 2017-05-19: qty 1

## 2017-05-19 MED ORDER — ACETAMINOPHEN 650 MG RE SUPP: 650.0000 mg | Freq: Four times a day (QID) | RECTAL | Status: DC | PRN

## 2017-05-19 MED ORDER — LACTATED RINGERS IV SOLN
INTRAVENOUS | Status: DC
  Administered 2017-05-19 – 2017-05-22 (×3): via INTRAVENOUS

## 2017-05-19 MED ORDER — TRIAMTERENE-HCTZ 37.5-25 MG PO TABS
1.0000 | ORAL_TABLET | Freq: Once | ORAL | Status: AC
  Administered 2017-05-19: 1 via ORAL
  Filled 2017-05-19: qty 1

## 2017-05-19 MED ORDER — ASPIRIN EC 81 MG PO TBEC
81.0000 mg | DELAYED_RELEASE_TABLET | Freq: Every day | ORAL | Status: DC
  Administered 2017-05-20 – 2017-05-22 (×3): 81 mg via ORAL
  Filled 2017-05-19 (×3): qty 1

## 2017-05-19 MED ORDER — IPRATROPIUM-ALBUTEROL 0.5-2.5 (3) MG/3ML IN SOLN
3.0000 mL | Freq: Four times a day (QID) | RESPIRATORY_TRACT | Status: DC
  Administered 2017-05-19 – 2017-05-22 (×12): 3 mL via RESPIRATORY_TRACT
  Filled 2017-05-19 (×12): qty 3

## 2017-05-19 MED ORDER — DOCUSATE SODIUM 100 MG PO CAPS
100.0000 mg | ORAL_CAPSULE | Freq: Two times a day (BID) | ORAL | Status: DC
  Administered 2017-05-19 – 2017-05-22 (×5): 100 mg via ORAL
  Filled 2017-05-19 (×6): qty 1

## 2017-05-19 MED ORDER — IPRATROPIUM-ALBUTEROL 0.5-2.5 (3) MG/3ML IN SOLN: 3.0000 mL | RESPIRATORY_TRACT | Status: AC

## 2017-05-19 MED ORDER — ONDANSETRON HCL 4 MG/2ML IJ SOLN: 4.0000 mg | Freq: Four times a day (QID) | INTRAMUSCULAR | Status: DC | PRN

## 2017-05-19 MED ORDER — BUSPIRONE HCL 5 MG PO TABS
5.0000 mg | ORAL_TABLET | Freq: Two times a day (BID) | ORAL | Status: DC
  Administered 2017-05-19 – 2017-05-22 (×6): 5 mg via ORAL
  Filled 2017-05-19 (×6): qty 1

## 2017-05-19 MED ORDER — IPRATROPIUM BROMIDE 0.02 % IN SOLN
RESPIRATORY_TRACT | Status: AC
  Administered 2017-05-19: 0.5 mg
  Filled 2017-05-19: qty 2.5

## 2017-05-19 MED ORDER — ALBUTEROL SULFATE (2.5 MG/3ML) 0.083% IN NEBU: 2.5000 mg | INHALATION_SOLUTION | RESPIRATORY_TRACT | Status: DC | PRN

## 2017-05-19 MED ORDER — METHYLPREDNISOLONE SODIUM SUCC 40 MG IJ SOLR
125.0000 mg | Freq: Once | INTRAMUSCULAR | Status: AC
  Administered 2017-05-19: 125 mg via INTRAVENOUS
  Filled 2017-05-19: qty 4

## 2017-05-19 MED ORDER — TRIAMTERENE-HCTZ 37.5-25 MG PO TABS
1.0000 | ORAL_TABLET | Freq: Every day | ORAL | Status: DC
  Administered 2017-05-20 – 2017-05-22 (×3): 1 via ORAL
  Filled 2017-05-19 (×3): qty 1

## 2017-05-19 MED ORDER — METHYLPREDNISOLONE SODIUM SUCC 125 MG IJ SOLR
80.0000 mg | Freq: Two times a day (BID) | INTRAMUSCULAR | Status: DC
  Administered 2017-05-20: 80 mg via INTRAVENOUS
  Filled 2017-05-19: qty 2

## 2017-05-19 MED ORDER — ACETAMINOPHEN 325 MG PO TABS: 650.0000 mg | ORAL_TABLET | Freq: Four times a day (QID) | ORAL | Status: DC | PRN

## 2017-05-19 MED ORDER — PREDNISONE 10 MG PO TABS: 60.0000 mg | ORAL_TABLET | Freq: Once | ORAL | Status: DC

## 2017-05-19 NOTE — ED Triage Notes (Signed)
Patient c/o shortness of breath that started on Thursday. Denies any chest pain. Per patient productive cough with thick, white sputum. Denies any fevers. Hx of asthma/COPD. Patient uses neb treatments and inhalers at home but states he ran out of inhaler and nebulizer machine broke.

## 2017-05-19 NOTE — ED Provider Notes (Signed)
[facility]-EMER[facility]Y DEPT Provider Note   CSN: [id number] Arrival date & time: 05/19/17  1741     History   Chief Complaint Chief Complaint  Patient presents with  . Shortness of Breath    HPI [name] is a 61 y.o. male.  The history is provided by the patient.  Shortness of Breath  This is a new problem. The average episode lasts 4 days. The problem occurs continuously.The current episode started more than 2 days ago. The problem has been gradually worsening. Associated symptoms include cough, sputum production and wheezing. Pertinent negatives include no fever, no coryza, no rhinorrhea, no sore throat, no chest pain, no syncope, no vomiting, no leg pain and no leg swelling. It is unknown what precipitated the problem. He has tried nothing for the symptoms. The treatment provided no relief.   61 year old male who presents with shortness of breath, progressively worsening over the past 4 days. No home oxygen. States that he ran out of his inhalers and breathing treatments, and over the past 4 days has had increasing shortness of breath with cough and clear sputum. No fevers, chills, lower extremity edema, calf tenderness, or chest pain.   Past Medical History:  Diagnosis Date  . Alcohol abuse   . Asthma   . COPD (chronic obstructive pulmonary disease) (HCC)   . Coronary atherosclerosis    Based on chest CT 2012  . Essential hypertension, benign   . History of neutropenia    Also lymphocytosis - followed by Dr. Zigmund Daniel  . Nicotine addiction     Patient Active Problem List   Diagnosis Date Noted  . GAD (generalized anxiety disorder) 08/01/2016  . Centrilobular emphysema (HCC) 11/01/2015  . Alcohol addiction (HCC) 06/23/2015  . Underweight 03/24/2015  . Vitamin D deficiency 03/24/2015  . Coronary atherosclerosis 03/11/2014  . Nonspecific abnormal electrocardiogram (ECG) (EKG) 02/17/2014  . Depression with anxiety 02/10/2013  . Neutropenia (HCC) 04/07/2012  . Asthma  with COPD (HCC) 11/01/2011  . Lymphocytosis 11/01/2011  . ALLERGIC RHINITIS CAUSE UNSPECIFIED 01/10/2011  . MALNUTRITION, MILD 11/27/2010  . Nicotine dependence, cigarettes, with other nicotine-induced disorders 03/30/2008  . Essential hypertension 03/30/2008  . COPD 03/30/2008    Past Surgical History:  Procedure Laterality Date  . MULTIPLE TOOTH EXTRACTIONS  June 2012       Home Medications    Prior to Admission medications   Medication Sig Start Date End Date Taking? Authorizing Provider  albuterol (PROVENTIL HFA;VENTOLIN HFA) 108 (90 Base) MCG/ACT inhaler Inhale 2 puffs into the lungs every 6 (six) hours as needed for wheezing or shortness of breath.   Yes Kerri Perches, MD  aspirin EC 81 MG tablet Take 81 mg by mouth daily.   Yes [provider]  budesonide-formoterol (SYMBICORT) 160-4.5 MCG/ACT inhaler Inhale 2 puffs into the lungs 2 (two) times daily. 04/11/17  Yes Kerri Perches, MD  busPIRone (BUSPAR) 5 MG tablet Take 1 tablet (5 mg total) by mouth 2 (two) times daily. 04/11/17  Yes Kerri Perches, MD  citalopram (CELEXA) 20 MG tablet Take 1 tablet (20 mg total) by mouth daily. 04/11/17  Yes Kerri Perches, MD  ipratropium-albuterol (DUONEB) 0.5-2.5 (3) MG/3ML SOLN Take 3 mLs by nebulization every 6 (six) hours as needed. Patient taking differently: Take 3 mLs by nebulization every 6 (six) hours as needed (for wheezing/shortness of breath).  04/11/17  Yes Kerri Perches, MD  triamterene-hydrochlorothiazide (MAXZIDE-25) 37.5-25 MG tablet Take 1 tablet by mouth daily. 04/11/17  Yes Lodema Hong,  Milus MallickMargaret E, MD    Family History Family History  Problem Relation Age of Onset  . Diabetes Mother   . Hypertension Mother   . Diabetes Father   . Hypertension Sister     Social History Social History  Substance Use Topics  . Smoking status: Current Every Day Smoker    Packs/day: 1.00    Years: 0.50    Types: Cigarettes  . Smokeless tobacco: Never  Used     Comment: quit around christmas 2016  . Alcohol use 10.8 oz/week    18 Cans of beer per week     Comment: "drinks beer every once in awhile"     Allergies   Lisinopril   Review of Systems Review of Systems  Constitutional: Negative for fever.  HENT: Negative for rhinorrhea and sore throat.   Respiratory: Positive for cough, sputum production, shortness of breath and wheezing.   Cardiovascular: Negative for chest pain, leg swelling and syncope.  Gastrointestinal: Negative for vomiting.  Hematological: Does not bruise/bleed easily.  Psychiatric/Behavioral: Negative for confusion.  All other systems reviewed and are negative.    Physical Exam Updated Vital Signs BP (!) 168/109   Pulse 100   Temp 98.4 F (36.9 C) (Oral)   Resp (!) 29   Ht 5\' 9"  (1.753 m)   Wt 64.9 kg (143 lb)   SpO2 100%   BMI 21.12 kg/m   Physical Exam Physical Exam  Nursing note and vitals reviewed. Constitutional: Non-toxic, and in no acute distress Head: Normocephalic and atraumatic.  Mouth/Throat: Oropharynx is clear and moist.  Neck: Normal range of motion. Neck supple.  Cardiovascular: Normal rate and regular rhythm.   Pulmonary/Chest: Effort mildly increased, and breath sounds with diffuse expiratory wheezing.  Abdominal: Soft. There is no tenderness. There is no rebound and no guarding.  Musculoskeletal: Normal range of motion. no edema. No calf tenderness Neurological: Alert, no facial droop, fluent speech, moves all extremities symmetrically Skin: Skin is warm and dry.  Psychiatric: Cooperative   ED Treatments / Results  Labs (all labs ordered are listed, but only abnormal results are displayed) Labs Reviewed  CBC WITH DIFFERENTIAL/PLATELET - Abnormal; Notable for the following:       Result Value   Eosinophils Absolute 1.2 (*)    All other components within normal limits  BASIC METABOLIC PANEL - Abnormal; Notable for the following:    Glucose, Bld 119 (*)    All other  components within normal limits    EKG  EKG Interpretation None       Radiology Dg Chest Portable 1 View  Result Date: 05/19/2017 CLINICAL DATA:  Dyspnea since Thursday. Denies chest pain. Cough with greenish weight sputum. EXAM: PORTABLE CHEST 1 VIEW COMPARISON:  02/21/2016 CXR FINDINGS: The heart size and mediastinal contours are within normal limits. Both lungs are clear. The visualized skeletal structures are unremarkable. IMPRESSION: No active disease. Electronically Signed   By: Tollie Ethavid  Kwon M.D.   On: 05/19/2017 19:20    Procedures Procedures (including critical care time) CRITICAL CARE Performed by: Lavera Guiseana Duo Kalia Vahey   Total critical care time: 35 minutes  Critical care time was exclusive of separately billable procedures and treating other patients.  Critical care was necessary to treat or prevent imminent or life-threatening deterioration.  Critical care was time spent personally by me on the following activities: development of treatment plan with patient and/or surrogate as well as nursing, discussions with consultants, evaluation of patient's response to treatment, examination of patient, obtaining history  from patient or surrogate, ordering and performing treatments and interventions, ordering and review of laboratory studies, ordering and review of radiographic studies, pulse oximetry and re-evaluation of patient's condition.  Medications Ordered in ED Medications  ipratropium-albuterol (DUONEB) 0.5-2.5 (3) MG/3ML nebulizer solution 3 mL (not administered)  triamterene-hydrochlorothiazide (MAXZIDE-25) 37.5-25 MG per tablet 1 tablet (not administered)  azithromycin (ZITHROMAX) 500 mg in dextrose 5 % 250 mL IVPB (not administered)  albuterol (PROVENTIL) (2.5 MG/3ML) 0.083% nebulizer solution 5 mg (5 mg Nebulization Given 05/19/17 1820)  methylPREDNISolone sodium succinate (SOLU-MEDROL) 40 mg/mL injection 125 mg (125 mg Intravenous Given 05/19/17 1854)  levalbuterol (XOPENEX)  0.63 MG/3ML nebulizer solution (0.63 mg  Given 05/19/17 1940)  ipratropium (ATROVENT) 0.02 % nebulizer solution (0.5 mg  Given 05/19/17 1940)  levalbuterol (XOPENEX) 0.63 MG/3ML nebulizer solution (0.63 mg  Given 05/19/17 2010)     Initial Impression / Assessment and Plan / ED Course  I have reviewed the triage vital signs and the nursing notes.  Pertinent labs & imaging results that were available during my care of the patient were reviewed by me and considered in my medical decision making (see chart for details).     Presenting with acute COPD exacerbation. Hypoxic on room air 88% on room air. Diffuse wheezing in all lung fields. Became diaphoretic with significantly increased work of breathing while using the urinal. Placed on BiPAP with improvement. Also went into SVT during this, and converted to sinus tachycardia with vagal maneuvers. Chest x-ray shows no evidence of edema, pulmonary edema, ammonia, or other acute cardiopulmonary processes. Received solumedrol, xopenox, duoneb, azithormycin for COPD. On re-evaluation, feels improved but persistent wheezing. Discussed with Dr. Ophelia Charter, who will admit for ongoing management.  Final Clinical Impressions(s) / ED Diagnoses   Final diagnoses:  Acute exacerbation of chronic obstructive pulmonary disease (COPD) (HCC)  Acute respiratory failure with hypoxia [facility])    New Prescriptions New Prescriptions   No medications on file     Lavera Guise, MD 05/19/17 2025

## 2017-05-19 NOTE — ED Notes (Signed)
Pt up to side of bed to use urinal and had some respiratory distress.  sats down in the 80'2.  Pt diaphoretic.  RT at bedside.  Obtaining IV access.

## 2017-05-19 NOTE — ED Notes (Signed)
Placing pt on bi-pap/   Dr. Hyacinth MeekerMiller and Dr Reynolds BowlLu at bedside.

## 2017-05-19 NOTE — Progress Notes (Signed)
Patient taken off BIPAP and placed on 2LNC. Patient's 02 saturations are 98%. Tolerating well at this time. RT will continue to monitor.

## 2017-05-19 NOTE — H&P (Signed)
History and Physical    [name] ZOX:[id number] DOB: 06/15/1956 DOA: 05/19/2017  PCP: Kerri Perches, MD Consultants:  None Patient coming from: Home - lives with mom and sister; NOK: mother and sister; [phone]  Chief Complaint: SOB  HPI: [name] is a 61 y.o. male with medical history significant of HTN, COPD (not on home O2), tobacco and ETOH dependence (reportedly stopped both within the last weeks to month) presenting with SOB.  He reports problems for about 2 weeks after he ran out of medications and his nebulizer machine broke.  He called Dr. Anthony Sar office on 5/29 to try to get another one.  +SOB with any kind of exertion.  No h/o home O2.  Slight coughing, worse in the last few days.  Productive of whitish sputum the last few days, since Thursday evening.  No fevers.  No other symptoms.   ED Course: Acute COPD exacerbation - 88% on room air at rest.  Diffuse wheezing.  Diaphoresis and increased WOB just in using the urinal.  Placed on BIPAP with improvement.  Also developed SVT which resolved with vagal maneuvers.  Negative CXR.  Given Solumedrol, Xopenex, Duoneb, and Azithromycin.  Review of Systems: As per HPI; otherwise review of systems reviewed and negative.   Ambulatory Status:  Ambulates without assistance  Past Medical History:  Diagnosis Date  . Alcohol abuse   . Asthma   . COPD (chronic obstructive pulmonary disease) (HCC)   . Coronary atherosclerosis    Based on chest CT 2012  . Essential hypertension, benign   . History of neutropenia    Also lymphocytosis - followed by Dr. Zigmund Daniel  . Nicotine addiction     Past Surgical History:  Procedure Laterality Date  . MULTIPLE TOOTH EXTRACTIONS  June 2012    Social History   Social History  . Marital status: Single    Spouse name: N/A  . Number of children: N/A  . Years of education: N/A   Occupational History  . unemployed    Social History Main Topics  . Smoking status:  Current Every Day Smoker    Packs/day: 1.00    Years: 38.00    Types: Cigarettes  . Smokeless tobacco: Never Used     Comment: quit "about a week ago" - does not think he needs a patch  . Alcohol use 10.8 oz/week    18 Cans of beer per week     Comment: "I slowed down on that too", reports last drank beer about 3 weeks ago  . Drug use: No  . Sexual activity: No   Other Topics Concern  . Not on file   Social History Narrative  . No narrative on file    Allergies  Allergen Reactions  . Lisinopril Swelling and Other (See Comments)    Reaction:  Angioedema    Family History  Problem Relation Age of Onset  . Diabetes Mother   . Hypertension Mother   . Diabetes Father   . Hypertension Sister     Prior to Admission medications   Medication Sig Start Date End Date Taking? Authorizing Provider  albuterol (PROVENTIL HFA;VENTOLIN HFA) 108 (90 Base) MCG/ACT inhaler Inhale 2 puffs into the lungs every 6 (six) hours as needed for wheezing or shortness of breath.   Yes Kerri Perches, MD  aspirin EC 81 MG tablet Take 81 mg by mouth daily.   Yes [provider]  budesonide-formoterol (SYMBICORT) 160-4.5 MCG/ACT inhaler Inhale 2 puffs into the  lungs 2 (two) times daily. 04/11/17  Yes Kerri PerchesSimpson, Margaret E, MD  busPIRone (BUSPAR) 5 MG tablet Take 1 tablet (5 mg total) by mouth 2 (two) times daily. 04/11/17  Yes Kerri PerchesSimpson, Margaret E, MD  citalopram (CELEXA) 20 MG tablet Take 1 tablet (20 mg total) by mouth daily. 04/11/17  Yes Kerri PerchesSimpson, Margaret E, MD  ipratropium-albuterol (DUONEB) 0.5-2.5 (3) MG/3ML SOLN Take 3 mLs by nebulization every 6 (six) hours as needed. Patient taking differently: Take 3 mLs by nebulization every 6 (six) hours as needed (for wheezing/shortness of breath).  04/11/17  Yes Kerri PerchesSimpson, Margaret E, MD  triamterene-hydrochlorothiazide (MAXZIDE-25) 37.5-25 MG tablet Take 1 tablet by mouth daily. 04/11/17  Yes Kerri PerchesSimpson, Margaret E, MD    Physical Exam: Vitals:    05/19/17 1900 05/19/17 1930 05/19/17 1940 05/19/17 1945  BP: (!) 187/118 (!) 168/109    Pulse: (!) 109 93  100  Resp: (!) 27   (!) 29  Temp:      TempSrc:      SpO2: 100% 100% 99% 100%  Weight:      Height:         General: Appears calm and comfortable and is NAD on BIPAP Eyes:  PERRL, EOMI, normal lids, iris ENT:  grossly normal hearing, lips & tongue, mmm Neck:  no LAD, masses or thyromegaly Cardiovascular:  RRR, no m/r/g. No LE edema.  Respiratory:  Diffuse wheezing throughout all lung fields, but relatively good air movement at this time on BIPAP. Normal respiratory effort. Abdomen:  soft, ntnd, NABS Skin:  no rash or induration seen on limited exam Musculoskeletal:  grossly normal tone BUE/BLE, good ROM, no bony abnormality Psychiatric:  grossly normal mood and affect, speech fluent and appropriate, AOx3 Neurologic:  CN 2-12 grossly intact, moves all extremities in coordinated fashion, sensation intact  Labs on Admission: I have personally reviewed following labs and imaging studies  CBC:  Recent Labs Lab 05/19/17 1850  WBC 7.7  NEUTROABS 3.2  HGB 16.2  HCT 48.5  MCV 87.2  PLT 253   Basic Metabolic Panel:  Recent Labs Lab 05/19/17 1850  NA 143  K 4.2  CL 101  CO2 29  GLUCOSE 119*  BUN 8  CREATININE 1.02  CALCIUM 9.9   GFR: Estimated Creatinine Clearance: 72.5 mL/min (by C-G formula based on SCr of 1.02 mg/dL). Liver Function Tests: No results for input(s): AST, ALT, ALKPHOS, BILITOT, PROT, ALBUMIN in the last 168 hours. No results for input(s): LIPASE, AMYLASE in the last 168 hours. No results for input(s): AMMONIA in the last 168 hours. Coagulation Profile: No results for input(s): INR, PROTIME in the last 168 hours. Cardiac Enzymes: No results for input(s): CKTOTAL, CKMB, CKMBINDEX, TROPONINI in the last 168 hours. BNP (last 3 results) No results for input(s): PROBNP in the last 8760 hours. HbA1C: No results for input(s): HGBA1C in the last 72  hours. CBG: No results for input(s): GLUCAP in the last 168 hours. Lipid Profile: No results for input(s): CHOL, HDL, LDLCALC, TRIG, CHOLHDL, LDLDIRECT in the last 72 hours. Thyroid Function Tests: No results for input(s): TSH, T4TOTAL, FREET4, T3FREE, THYROIDAB in the last 72 hours. Anemia Panel: No results for input(s): VITAMINB12, FOLATE, FERRITIN, TIBC, IRON, RETICCTPCT in the last 72 hours. Urine analysis:    Component Value Date/Time   COLORURINE YELLOW 02/21/2016 2242   APPEARANCEUR CLEAR 02/21/2016 2242   LABSPEC 1.010 02/21/2016 2242   PHURINE 5.5 02/21/2016 2242   GLUCOSEU NEGATIVE 02/21/2016 2242   HGBUR NEGATIVE 02/21/2016  2242   BILIRUBINUR NEGATIVE 02/21/2016 2242   KETONESUR NEGATIVE 02/21/2016 2242   PROTEINUR NEGATIVE 02/21/2016 2242   NITRITE NEGATIVE 02/21/2016 2242   LEUKOCYTESUR NEGATIVE 02/21/2016 2242    Creatinine Clearance: Estimated Creatinine Clearance: 72.5 mL/min (by C-G formula based on SCr of 1.02 mg/dL).  Sepsis Labs: @LABRCNTIP (procalcitonin:4,lacticidven:4) )No results found for this or any previous visit (from the past 240 hour(s)).   Radiological Exams on Admission: Dg Chest Portable 1 View  Result Date: 05/19/2017 CLINICAL DATA:  Dyspnea since Thursday. Denies chest pain. Cough with greenish weight sputum. EXAM: PORTABLE CHEST 1 VIEW COMPARISON:  02/21/2016 CXR FINDINGS: The heart size and mediastinal contours are within normal limits. Both lungs are clear. The visualized skeletal structures are unremarkable. IMPRESSION: No active disease. Electronically Signed   By: Tollie Eth M.D.   On: 05/19/2017 19:20    EKG: Independently reviewed.  Initial at 1821 - NSR, rate 99, no acute ischemia; repeat at 1853 - SVT with rate 155; nonspecific ST changes that are likely rate-related  Assessment/Plan Principal Problem:   COPD exacerbation (HCC) Active Problems:   Nicotine dependence, cigarettes, with other nicotine-induced disorders   Essential  hypertension   Depression with anxiety   Alcohol addiction (HCC)   SVT (supraventricular tachycardia) (HCC)   COPD exacerbation -Patient's shortness of breath and productive cough are most likely caused by acute COPD exacerbation.  -He has history of COPD and has had recent noncompliance with medications (ran out and nebulizer machine broke) and continued tobacco abuse.  -Chest x-ray is not consistent with pneumonia or CHF  -Patient received 1 dose of IV azithromycin, Solumedrol, duoneb, and Albuterol in the ED.  -He was placed on BIPAP with improvement in respiratory effort (ventilation) as well as oxygenation.  -will admit patient to SDU -Continue BIPAP through the night  -Nebulizers: scheduled Duoneb and prn albuterol -Solu-Medrol 80 mg IV BID  -Continue azithromycin for 5 days.  -Urine drug screen, HIV -He has never seen pulmonology; will request consult  SVT -Patient experienced an episode of SVT while in the ER; it immediately followed an episode of respiratory distress that led to initiation of BIPAP -SVT resolved with vagal maneuvers -Will continue to monitor on telemetry for now  HTN -Recently changed to Maxzide from Norvasc for cost reasons -If he has recurrent SVT, a beta blocker (selective) may be a better choice -Will follow  Depression/anxiety -Continue Celexa and BuSpar  H/o tobacco and alcohol dependence -Reports stopping ETOH 3 weeks ago; will check ETOH level to decide if he needs CIWA now -If he exhibits evidence of withdrawal, low threshold for starting CIWA protocol -He reports stopping smoking about a week ago when these symptoms worsened -He declines a patch at this time -ongoing counseling to encourage cessation will be important  DVT prophylaxis: Lovenox  Code Status:  Full - confirmed with patient Family Communication: None present Disposition Plan:  Home once clinically improved Consults called: Pulm; SW/CM/PT/OT/Nutrition  Admission status:  Admit - It is my clinical opinion that admission to INPATIENT is reasonable and necessary because this patient will require at least 2 midnights in the hospital to treat this condition based on the medical complexity of the problems presented.  Given the aforementioned information, the predictability of an adverse outcome is felt to be significant.    Jonah Blue MD [facility]  If 7PM-7AM, please contact night-coverage [email or web address] Password [facility]  05/19/2017, 8:54 PM

## 2017-05-20 LAB — RAPID URINE DRUG SCREEN, HOSP PERFORMED
Amphetamines: NOT DETECTED
Barbiturates: NOT DETECTED
Benzodiazepines: NOT DETECTED
COCAINE: NOT DETECTED
Opiates: NOT DETECTED
Tetrahydrocannabinol: POSITIVE — AB

## 2017-05-20 LAB — BASIC METABOLIC PANEL
Anion gap: 12 (ref 5–15)
BUN: 9 mg/dL (ref 6–20)
CHLORIDE: 98 mmol/L — AB (ref 101–111)
CO2: 26 mmol/L (ref 22–32)
Calcium: 9.6 mg/dL (ref 8.9–10.3)
Creatinine, Ser: 0.98 mg/dL (ref 0.61–1.24)
GFR calc non Af Amer: >60 mL/min (ref >60)
Glucose, Bld: 165 mg/dL — ABNORMAL HIGH (ref 65–99)
Potassium: 4 mmol/L (ref 3.5–5.1)
Sodium: 136 mmol/L (ref 135–145)

## 2017-05-20 LAB — CBC
HCT: 45.4 % (ref 39.0–52.0)
HEMOGLOBIN: 14.9 g/dL (ref 13.0–17.0)
MCH: 28.4 pg (ref 26.0–34.0)
MCHC: 32.8 g/dL (ref 30.0–36.0)
MCV: 86.6 fL (ref 78.0–100.0)
Platelets: 255 10*3/uL (ref 150–400)
RBC: 5.24 MIL/uL (ref 4.22–5.81)
RDW: 12.9 % (ref 11.5–15.5)
WBC: 3.8 10*3/uL — ABNORMAL LOW (ref 4.0–10.5)

## 2017-05-20 LAB — MRSA PCR SCREENING: MRSA by PCR: NEGATIVE

## 2017-05-20 MED ORDER — FOLIC ACID 1 MG PO TABS
1.0000 mg | ORAL_TABLET | Freq: Every day | ORAL | Status: DC
  Administered 2017-05-20 – 2017-05-22 (×3): 1 mg via ORAL
  Filled 2017-05-20 (×3): qty 1

## 2017-05-20 MED ORDER — VITAMIN B-1 100 MG PO TABS
100.0000 mg | ORAL_TABLET | Freq: Every day | ORAL | Status: DC
  Administered 2017-05-20 – 2017-05-22 (×3): 100 mg via ORAL
  Filled 2017-05-20 (×3): qty 1

## 2017-05-20 MED ORDER — METHYLPREDNISOLONE SODIUM SUCC 125 MG IJ SOLR
80.0000 mg | Freq: Three times a day (TID) | INTRAMUSCULAR | Status: DC
  Administered 2017-05-20 – 2017-05-22 (×7): 80 mg via INTRAVENOUS
  Filled 2017-05-20 (×7): qty 2

## 2017-05-20 MED ORDER — ENSURE ENLIVE PO LIQD
237.0000 mL | Freq: Two times a day (BID) | ORAL | Status: DC
  Administered 2017-05-20 – 2017-05-21 (×3): 237 mL via ORAL

## 2017-05-20 NOTE — Progress Notes (Signed)
PROGRESS NOTE    [name]  ZOX:[id number] DOB: 06/07/1956 DOA: 05/19/2017 PCP: Kerri Perches, MD     Brief Narrative:  61 y/o man admitted from home due to SOB. Found to have a COPD exacerbation. Because he required NIPPV in the ED decision was made to admit him to SDU.   Assessment & Plan:   Principal Problem:   COPD exacerbation (HCC) Active Problems:   Nicotine dependence, cigarettes, with other nicotine-induced disorders   Essential hypertension   Depression with anxiety   Alcohol addiction (HCC)   SVT (supraventricular tachycardia) (HCC)   COPD with Acute Exacerbation -Has been weaned off bipap; good sats on [place] oxygen. -Still with significant wheezes on exam, will increase steroid dose. -Continue azithro.  HTN -Fair control. -Do not anticipated further medications changes while in the hospital.  SVT -Transient episode, likely related to beta-agonist therapy.  ETOH -Thiamine/folate.     DVT prophylaxis: lovenox Code Status: full code Family Communication: patient only Disposition Plan: transfer to floor. Anticipate DC home in 48-72 hours  Consultants:   None  Procedures:   None  Antimicrobials:  Anti-infectives    Start     Dose/Rate Route Frequency Ordered Stop   05/19/17 2030  azithromycin (ZITHROMAX) 500 mg in dextrose 5 % 250 mL IVPB     500 mg 250 mL/hr over 60 Minutes Intravenous Every 24 hours 05/19/17 2022         Subjective: Lying in bed. States his SOB is much improved. Still with intercostal retractions, tho.  Objective: Vitals:   05/20/17 0700 05/20/17 0723 05/20/17 0737 05/20/17 0800  BP: (!) 147/102   (!) 143/97  Pulse: (!) 107  83 94  Resp: (!) 22  (!) 24 (!) 25  Temp:   99.4 F (37.4 C)   TempSrc:   Oral   SpO2: 94% 94% 94% 93%  Weight:      Height:        Intake/Output Summary (Last 24 hours) at 05/20/17 1004 Last data filed at 05/20/17 0850  Gross per 24 hour  Intake           713.75 ml  Output              1200 ml  Net          -486.25 ml   Filed Weights   05/19/17 1747 05/19/17 2206 05/20/17 0500  Weight: 64.9 kg (143 lb) 62.6 kg (138 lb 0.1 oz) 62.6 kg (138 lb 0.1 oz)    Examination:  General exam: Alert, awake, oriented x 3 Respiratory system: Diffuse bilateral expiratory wheezes, ronchi and intercostal muscle retractions. Cardiovascular system:RRR. No murmurs, rubs, gallops. Gastrointestinal system: Abdomen is nondistended, soft and nontender. No organomegaly or masses felt. Normal bowel sounds heard. Central nervous system: Alert and oriented. No focal neurological deficits. Extremities: No C/C/E, +pedal pulses Skin: No rashes, lesions or ulcers Psychiatry: Judgement and insight appear normal. Mood & affect appropriate.     Data Reviewed: I have personally reviewed following labs and imaging studies  CBC:  Recent Labs Lab 05/19/17 1850 05/20/17 0450  WBC 7.7 3.8*  NEUTROABS 3.2  --   HGB 16.2 14.9  HCT 48.5 45.4  MCV 87.2 86.6  PLT 253 255   Basic Metabolic Panel:  Recent Labs Lab 05/19/17 1850 05/20/17 0450  NA 143 136  K 4.2 4.0  CL 101 98*  CO2 29 26  GLUCOSE 119* 165*  BUN 8 9  CREATININE 1.02 0.98  CALCIUM 9.9 9.6   GFR: Estimated Creatinine Clearance: 72.7 mL/min (by C-G formula based on SCr of 0.98 mg/dL). Liver Function Tests: No results for input(s): AST, ALT, ALKPHOS, BILITOT, PROT, ALBUMIN in the last 168 hours. No results for input(s): LIPASE, AMYLASE in the last 168 hours. No results for input(s): AMMONIA in the last 168 hours. Coagulation Profile: No results for input(s): INR, PROTIME in the last 168 hours. Cardiac Enzymes: No results for input(s): CKTOTAL, CKMB, CKMBINDEX, TROPONINI in the last 168 hours. BNP (last 3 results) No results for input(s): PROBNP in the last 8760 hours. HbA1C: No results for input(s): HGBA1C in the last 72 hours. CBG: No results for input(s): GLUCAP in the last 168 hours. Lipid Profile: No  results for input(s): CHOL, HDL, LDLCALC, TRIG, CHOLHDL, LDLDIRECT in the last 72 hours. Thyroid Function Tests: No results for input(s): TSH, T4TOTAL, FREET4, T3FREE, THYROIDAB in the last 72 hours. Anemia Panel: No results for input(s): VITAMINB12, FOLATE, FERRITIN, TIBC, IRON, RETICCTPCT in the last 72 hours. Urine analysis:    Component Value Date/Time   COLORURINE YELLOW 02/21/2016 2242   APPEARANCEUR CLEAR 02/21/2016 2242   LABSPEC 1.010 02/21/2016 2242   PHURINE 5.5 02/21/2016 2242   GLUCOSEU NEGATIVE 02/21/2016 2242   HGBUR NEGATIVE 02/21/2016 2242   BILIRUBINUR NEGATIVE 02/21/2016 2242   KETONESUR NEGATIVE 02/21/2016 2242   PROTEINUR NEGATIVE 02/21/2016 2242   NITRITE NEGATIVE 02/21/2016 2242   LEUKOCYTESUR NEGATIVE 02/21/2016 2242   Sepsis Labs: @LABRCNTIP (procalcitonin:4,lacticidven:4)  ) Recent Results (from the past 240 hour(s))  MRSA PCR Screening     Status: None   Collection Time: 05/19/17 10:55 PM  Result Value Ref Range Status   MRSA by PCR NEGATIVE NEGATIVE Final    Comment:        The GeneXpert MRSA Assay (FDA approved for NASAL specimens only), is one component of a comprehensive MRSA colonization surveillance program. It is not intended to diagnose MRSA infection nor to guide or monitor treatment for MRSA infections.          Radiology Studies: Dg Chest Portable 1 View  Result Date: 05/19/2017 CLINICAL DATA:  Dyspnea since Thursday. Denies chest pain. Cough with greenish weight sputum. EXAM: PORTABLE CHEST 1 VIEW COMPARISON:  02/21/2016 CXR FINDINGS: The heart size and mediastinal contours are within normal limits. Both lungs are clear. The visualized skeletal structures are unremarkable. IMPRESSION: No active disease. Electronically Signed   By: Tollie Ethavid  Kwon M.D.   On: 05/19/2017 19:20        Scheduled Meds: . aspirin EC  81 mg Oral Daily  . busPIRone  5 mg Oral BID  . citalopram  20 mg Oral Daily  . docusate sodium  100 mg Oral BID  .  enoxaparin (LOVENOX) injection  40 mg Subcutaneous Q24H  . ipratropium-albuterol  3 mL Nebulization Q6H  . methylPREDNISolone (SOLU-MEDROL) injection  80 mg Intravenous Q8H  . sodium chloride flush  3 mL Intravenous Q12H  . triamterene-hydrochlorothiazide  1 tablet Oral Daily   Continuous Infusions: . azithromycin Stopped (05/19/17 2149)  . lactated ringers 75 mL/hr at 05/20/17 0600     LOS: 1 day    Time spent: 35 minutes. Greater than 50% of this time was spent in direct contact with the patient coordinating care.     Chaya JanHERNANDEZ ACOSTA,ESTELA, MD [facility] Pager [phone]  If 7PM-7AM, please contact night-coverage [email or web address] Password [email or web address] 05/20/2017, 10:04 AM

## 2017-05-20 NOTE — Clinical Social Work Note (Signed)
Patient does not meet COPD Gold criteria. Patient has not had the required three admission in the past six months.   Please re-consult to CSW if additional CSW needs are made aware.   LCSW signing off.      [name], Nicholas ChinaHeather D, LCSW

## 2017-05-20 NOTE — Progress Notes (Signed)
Initial Nutrition Assessment    INTERVENTION:  Ensure Enlive po BID, each supplement provides 350 kcal and 20 grams of protein    NUTRITION DIAGNOSIS:   Inadequate oral intake related to  (Shortness of breath) as evidenced by per patient/family report (decreased from usual due to breathing difficulty).   GOAL:   Patient will meet greater than or equal to 90% of their needs   MONITOR:   PO intake, Supplement acceptance, Labs, Weight trends  REASON FOR ASSESSMENT:   Consult COPD Protocol  ASSESSMENT:   Pt is a 61 yo who has long hx of smoking cigarettes (1PPD x 38 yrs) and drinking alchohol daily. He lives with his sister and has not worked since January.   The patient says he home diet is regular. He lives with his sister and she cooks for them most days.  Diet hx: 3-4 times weekly he has eggs, bacon and toast for breakfast and a hot meal in the evening a protein, starch and vegetable. He drinks water with his meals. Although, he has not been eating well with the acute COPD exacerbation. For the past week his intake has been decreased.  Weight hx: stable in the upper 130's most of time per pt. At times he has been in the 140's but not recently.   Nutrition-Focused physical exam completed. Findings are mild buccal fat depletion, mild temporal muscle depletion, and no edema.     Recent Labs Lab 05/19/17 1850 05/20/17 0450  NA 143 136  K 4.2 4.0  CL 101 98*  CO2 29 26  BUN 8 9  CREATININE 1.02 0.98  CALCIUM 9.9 9.6  GLUCOSE 119* 165*   Labs- Glu 165   Meds- Colace, folic acid, Thiamine, solumedrol  Diet Order:  Diet Heart Room service appropriate? Yes; Fluid consistency: Thin  Skin:  Reviewed, no issues  Last BM:  Prior to admission   Height:   Ht Readings from Last 1 Encounters:  05/19/17 5\' 9"  (1.753 m)    Weight:   Wt Readings from Last 1 Encounters:  05/20/17 138 lb 0.1 oz (62.6 kg)    Ideal Body Weight:  73 kg  BMI:  Body mass index is 20.38  kg/m.  Estimated Nutritional Needs:   Kcal:  1890-2205 (30-35 kcal/kg) to prevent wt loss  Protein:  76-82 gr (1.2-1.3 gr/kg)  Fluid:  > 1.9 liters daily  EDUCATION NEEDS:   No education needs identified at this time   Royann ShiversLynn Garrell Flagg MS,RD,CSG,LDN Office: [phone] Pager: [phone]

## 2017-05-20 NOTE — Progress Notes (Signed)
Patient alert and oriented. Vital signs are stable. IV patent. Patient denies pain or any distress. Report given to Leotis ShamesLauren, RN. Patient transferred to room 340 via wheelchair.

## 2017-05-20 NOTE — Evaluation (Signed)
Physical Therapy Evaluation Patient Details Name: [name] MRN: [id number] DOB: 1956-05-11 Today's Date: 05/20/2017   History of Present Illness  [name] is a 61yo bl;ack male who comes to [facility] on 6/3 after 2 W SOB and DOE. His nebulizer machien broke at home and he has not been able to take treatments.  PMH: ETOH abuse, tobacco use, HTN, COPD.    Clinical Impression  Pt admitted with above diagnosis. Pt currently with functional limitations due to the deficits listed below (see "PT Problem List"). Pt performing all functional mobility independentlyly. Pt AMB limited community distances on RA with Sp02>91%. Pt is near baseline, steadily improving ability to breathe. No skilled services needed at this time. PT signing off.       Follow Up Recommendations No PT follow up    Equipment Recommendations  None recommended by PT    Recommendations for Other Services       Precautions / Restrictions Precautions Precautions: None      Mobility  Bed Mobility Overal bed mobility: Independent                Transfers Overall transfer level: Independent                  Ambulation/Gait Ambulation/Gait assistance: Modified independent (Device/Increase time);Independent Ambulation Distance (Feet): 220 Feet Assistive device: None Gait Pattern/deviations: WFL(Within Functional Limits) Gait velocity: 1.4m/s      Stairs            Wheelchair Mobility    Modified Rankin (Stroke Patients Only)       Balance Overall balance assessment: Independent;No apparent balance deficits (not formally assessed)                                           Pertinent Vitals/Pain Pain Assessment: No/denies pain    Home Living Family/patient expects to be discharged to:: Private residence Living Arrangements: Parent;Other relatives [employer] forces training and education officer) Available Help at Discharge: Family Type of Home: House Home Access: Stairs to enter Entrance  Stairs-Rails: Right Entrance Stairs-Number of Steps: 14 steps into basement where he resides Home Layout: Two level Home Equipment: None      Prior Function Level of Independence: Independent               Hand Dominance        Extremity/Trunk Assessment   Upper Extremity Assessment Upper Extremity Assessment: Overall WFL for tasks assessed    Lower Extremity Assessment Lower Extremity Assessment: Overall WFL for tasks assessed       Communication   Communication: No difficulties  Cognition Arousal/Alertness: Awake/alert Behavior During Therapy: WFL for tasks assessed/performed Overall Cognitive Status: Within Functional Limits for tasks assessed                                        General Comments      Exercises     Assessment/Plan    PT Assessment Patent does not need any further PT services  PT Problem List         PT Treatment Interventions      PT Goals (Current goals can be found in the Care Plan section)  Acute Rehab PT Goals PT Goal Formulation: All assessment and education complete, DC therapy    Frequency  Barriers to discharge        Co-evaluation               AM-PAC PT "6 Clicks" Daily Activity  Outcome Measure Difficulty turning over in bed (including adjusting bedclothes, sheets and blankets)?: None Difficulty moving from lying on back to sitting on the side of the bed? : None Difficulty sitting down on and standing up from a chair with arms (e.g., wheelchair, bedside commode, etc,.)?: None Help needed moving to and from a bed to chair (including a wheelchair)?: None Help needed walking in hospital room?: None Help needed climbing 3-5 steps with a railing? : None 6 Click Score: 24    End of Session   Activity Tolerance: Patient tolerated treatment well;No increased pain Patient left: in bed Nurse Communication: Mobility status PT Visit Diagnosis: Difficulty in walking, not elsewhere classified  (R26.2)    Time: [id number] PT Time Calculation (min) (ACUTE ONLY): 11 min   Charges:   PT Evaluation $PT Eval Low Complexity: 1 Procedure     PT G Codes:        11:51 AM, 05/20/17 Rosamaria LintsAllan C Adonys Wildes, PT, DPT [facility] [phone] (office)     Marlys Stegmaier C 05/20/2017, 11:50 AM

## 2017-05-20 NOTE — Care Management Note (Signed)
Case Management Note  Patient Details  Name: [name] MRN: [id number] Date of Birth: Nov 23, 1956  Subjective/Objective: Adm with COPD exacerbation. From home with family. Ind PTA. No oxygen PTA. No HH or other DME PTA. Currently without insurance. [facility]. Patient will need new neb machine.                  Action/Plan: [facility] notified of neb machine order and will f/u with patient. CM will follow for oxygen needs, Anticipate patient will wean to room air. [facility] Has several boxes of neb treatments at home. May need MATCH Voucher for other prescriptions.    Expected Discharge Date:     05/21/2017             Expected Discharge Plan:  Home/Self Care  In-House Referral:     Discharge planning Services  CM Consult  Post Acute Care Choice:  Durable Medical Equipment Choice offered to:  Patient  DME Arranged:  Nebulizer machine DME Agency:  [facility]  [facility] Agency:     Status of Service:  In process, will continue to follow  If discussed at Long Length of Stay Meetings, dates discussed:    Additional Comments:  Priscillia Fouch, Chrystine OilerSharley Diane, RN 05/20/2017, 12:02 PM

## 2017-05-21 LAB — CBC
HCT: 45.1 % (ref 39.0–52.0)
Hemoglobin: 14.8 g/dL (ref 13.0–17.0)
MCH: 28.1 pg (ref 26.0–34.0)
MCHC: 32.8 g/dL (ref 30.0–36.0)
MCV: 85.6 fL (ref 78.0–100.0)
PLATELETS: 277 10*3/uL (ref 150–400)
RBC: 5.27 MIL/uL (ref 4.22–5.81)
RDW: 13.1 % (ref 11.5–15.5)
WBC: 15.1 10*3/uL — AB (ref 4.0–10.5)

## 2017-05-21 LAB — BASIC METABOLIC PANEL
Anion gap: 9 (ref 5–15)
BUN: 21 mg/dL — AB (ref 6–20)
CALCIUM: 9 mg/dL (ref 8.9–10.3)
CO2: 27 mmol/L (ref 22–32)
CREATININE: 1.15 mg/dL (ref 0.61–1.24)
Chloride: 98 mmol/L — ABNORMAL LOW (ref 101–111)
GFR calc Af Amer: >60 mL/min (ref >60)
GFR calc non Af Amer: >60 mL/min (ref >60)
Glucose, Bld: 165 mg/dL — ABNORMAL HIGH (ref 65–99)
Potassium: 3.9 mmol/L (ref 3.5–5.1)
Sodium: 134 mmol/L — ABNORMAL LOW (ref 135–145)

## 2017-05-21 LAB — HIV ANTIBODY (ROUTINE TESTING W REFLEX): HIV SCREEN 4TH GENERATION: NONREACTIVE

## 2017-05-21 NOTE — Progress Notes (Signed)
OT Cancellation Note  Patient Details Name: Noel Geroldrnest D Mish MRN: [id number] DOB: [birth year]   Cancelled Treatment:    Reason Eval/Treat Not Completed: OT screened, no needs identified, will sign off. Chart reviewed, pt screened for OT needs. Pt is at baseline level of functioning with ADL and functional mobility tasks. No further OT services required at this time.    Ezra SitesLeslie Troxler, OTR/L  [phone] 05/21/2017, 8:59 AM

## 2017-05-21 NOTE — Progress Notes (Signed)
PROGRESS NOTE    [name]  ZOX:[id number] DOB: [birth year] DOA: 05/19/2017 PCP: Kerri Perches, MD     Brief Narrative:  61 y/o man admitted from home due to SOB. Found to have a COPD exacerbation. Because he required NIPPV in the ED decision was made to admit him to SDU. As of 6/5 he has clinically improved but still has significant wheezing on exam and increased SOB with ambulation.   Assessment & Plan:   Principal Problem:   COPD exacerbation (HCC) Active Problems:   Nicotine dependence, cigarettes, with other nicotine-induced disorders   Essential hypertension   Depression with anxiety   Alcohol addiction (HCC)   SVT (supraventricular tachycardia) (HCC)   COPD with Acute Exacerbation -Has been weaned off bipap; currently has been weaned completely off oxygen. -Still with significant wheezes on exam, keep current steroid dose without titrating today. -Continue azithro for 5 days.  HTN -Fair control. -Do not anticipate further medications changes while in the hospital.  SVT -Transient episode, likely related to beta-agonist therapy.  ETOH -Thiamine/folate. -Counseled on cessation.  Leukocytosis -Likely steroid induced and WBCs were normal on admission.     DVT prophylaxis: lovenox Code Status: full code Family Communication: patient only Disposition Plan: transfer to floor. Anticipate DC home in 24-48 hours  Consultants:   None  Procedures:   None  Antimicrobials:  Anti-infectives    Start     Dose/Rate Route Frequency Ordered Stop   05/19/17 2030  azithromycin (ZITHROMAX) 500 mg in dextrose 5 % 250 mL IVPB     500 mg 250 mL/hr over 60 Minutes Intravenous Every 24 hours 05/19/17 2022         Subjective: Feels much better. Still some SOB with ambulation. Also feels like he wheezes at times.  Objective: Vitals:   05/20/17 2151 05/21/17 0248 05/21/17 0536 05/21/17 0802  BP: 122/71  127/76   Pulse: 78  83   Resp: 16  16   Temp:  98.2 F (36.8 C)  98.6 F (37 C)   TempSrc: Oral  Oral   SpO2: 94% 91% 93% 90%  Weight:      Height:        Intake/Output Summary (Last 24 hours) at 05/21/17 1400 Last data filed at 05/21/17 0900  Gross per 24 hour  Intake           2787.5 ml  Output             2300 ml  Net            487.5 ml   Filed Weights   05/19/17 1747 05/19/17 2206 05/20/17 0500  Weight: 64.9 kg (143 lb) 62.6 kg (138 lb 0.1 oz) 62.6 kg (138 lb 0.1 oz)    Examination:  General exam: Alert, awake, oriented x 3 Respiratory system: Diffuse bilateral expiratory wheezes Cardiovascular system:RRR. No murmurs, rubs, gallops. Gastrointestinal system: Abdomen is nondistended, soft and nontender. No organomegaly or masses felt. Normal bowel sounds heard. Central nervous system: Alert and oriented. No focal neurological deficits. Extremities: No C/C/E, +pedal pulses Skin: No rashes, lesions or ulcers Psychiatry: Judgement and insight appear normal. Mood & affect appropriate.      Data Reviewed: I have personally reviewed following labs and imaging studies  CBC:  Recent Labs Lab 05/19/17 1850 05/20/17 0450 05/21/17 0413  WBC 7.7 3.8* 15.1*  NEUTROABS 3.2  --   --   HGB 16.2 14.9 14.8  HCT 48.5 45.4 45.1  MCV 87.2 86.6 85.6  PLT 253 255 277   Basic Metabolic Panel:  Recent Labs Lab 05/19/17 1850 05/20/17 0450 05/21/17 0413  NA 143 136 134*  K 4.2 4.0 3.9  CL 101 98* 98*  CO2 29 26 27   GLUCOSE 119* 165* 165*  BUN 8 9 21*  CREATININE 1.02 0.98 1.15  CALCIUM 9.9 9.6 9.0   GFR: Estimated Creatinine Clearance: 62 mL/min (by C-G formula based on SCr of 1.15 mg/dL). Liver Function Tests: No results for input(s): AST, ALT, ALKPHOS, BILITOT, PROT, ALBUMIN in the last 168 hours. No results for input(s): LIPASE, AMYLASE in the last 168 hours. No results for input(s): AMMONIA in the last 168 hours. Coagulation Profile: No results for input(s): INR, PROTIME in the last 168 hours. Cardiac  Enzymes: No results for input(s): CKTOTAL, CKMB, CKMBINDEX, TROPONINI in the last 168 hours. BNP (last 3 results) No results for input(s): PROBNP in the last 8760 hours. HbA1C: No results for input(s): HGBA1C in the last 72 hours. CBG: No results for input(s): GLUCAP in the last 168 hours. Lipid Profile: No results for input(s): CHOL, HDL, LDLCALC, TRIG, CHOLHDL, LDLDIRECT in the last 72 hours. Thyroid Function Tests: No results for input(s): TSH, T4TOTAL, FREET4, T3FREE, THYROIDAB in the last 72 hours. Anemia Panel: No results for input(s): VITAMINB12, FOLATE, FERRITIN, TIBC, IRON, RETICCTPCT in the last 72 hours. Urine analysis:    Component Value Date/Time   COLORURINE YELLOW 02/21/2016 2242   APPEARANCEUR CLEAR 02/21/2016 2242   LABSPEC 1.010 02/21/2016 2242   PHURINE 5.5 02/21/2016 2242   GLUCOSEU NEGATIVE 02/21/2016 2242   HGBUR NEGATIVE 02/21/2016 2242   BILIRUBINUR NEGATIVE 02/21/2016 2242   KETONESUR NEGATIVE 02/21/2016 2242   PROTEINUR NEGATIVE 02/21/2016 2242   NITRITE NEGATIVE 02/21/2016 2242   LEUKOCYTESUR NEGATIVE 02/21/2016 2242   Sepsis Labs: @LABRCNTIP (procalcitonin:4,lacticidven:4)  ) Recent Results (from the past 240 hour(s))  MRSA PCR Screening     Status: None   Collection Time: 05/19/17 10:55 PM  Result Value Ref Range Status   MRSA by PCR NEGATIVE NEGATIVE Final    Comment:        The GeneXpert MRSA Assay (FDA approved for NASAL specimens only), is one component of a comprehensive MRSA colonization surveillance program. It is not intended to diagnose MRSA infection nor to guide or monitor treatment for MRSA infections.          Radiology Studies: Dg Chest Portable 1 View  Result Date: 05/19/2017 CLINICAL DATA:  Dyspnea since Thursday. Denies chest pain. Cough with greenish weight sputum. EXAM: PORTABLE CHEST 1 VIEW COMPARISON:  02/21/2016 CXR FINDINGS: The heart size and mediastinal contours are within normal limits. Both lungs are  clear. The visualized skeletal structures are unremarkable. IMPRESSION: No active disease. Electronically Signed   By: Tollie Ethavid  Kwon M.D.   On: 05/19/2017 19:20        Scheduled Meds: . aspirin EC  81 mg Oral Daily  . busPIRone  5 mg Oral BID  . citalopram  20 mg Oral Daily  . docusate sodium  100 mg Oral BID  . enoxaparin (LOVENOX) injection  40 mg Subcutaneous Q24H  . feeding supplement (ENSURE ENLIVE)  237 mL Oral BID BM  . folic acid  1 mg Oral Daily  . ipratropium-albuterol  3 mL Nebulization Q6H  . methylPREDNISolone (SOLU-MEDROL) injection  80 mg Intravenous Q8H  . sodium chloride flush  3 mL Intravenous Q12H  . thiamine  100 mg Oral Daily  . triamterene-hydrochlorothiazide  1 tablet Oral Daily  Continuous Infusions: . azithromycin Stopped (05/20/17 2206)  . lactated ringers 75 mL/hr at 05/20/17 2106     LOS: 2 days    Time spent: 25 minutes. Greater than 50% of this time was spent in direct contact with the patient coordinating care.     Chaya Jan, MD [facility] Pager [phone]  If 7PM-7AM, please contact night-coverage [email or web address] Password [email or web address] 05/21/2017, 2:00 PM

## 2017-05-22 DIAGNOSIS — J9601 Acute respiratory failure with hypoxia: Secondary | ICD-10-CM

## 2017-05-22 DIAGNOSIS — F10288 Alcohol dependence with other alcohol-induced disorder: Secondary | ICD-10-CM

## 2017-05-22 DIAGNOSIS — J441 Chronic obstructive pulmonary disease with (acute) exacerbation: Principal | ICD-10-CM

## 2017-05-22 MED ORDER — PREDNISONE 10 MG (21) PO TBPK: ORAL_TABLET | ORAL | 0 refills | Status: DC

## 2017-05-22 MED ORDER — BUDESONIDE-FORMOTEROL FUMARATE 160-4.5 MCG/ACT IN AERO: 2.0000 | INHALATION_SPRAY | Freq: Two times a day (BID) | RESPIRATORY_TRACT | 3 refills | Status: DC

## 2017-05-22 MED ORDER — AZITHROMYCIN 250 MG PO TABS: ORAL_TABLET | ORAL | 0 refills | Status: AC

## 2017-05-22 NOTE — Discharge Summary (Signed)
Physician Discharge Summary  [name]:[id number] DOB: [birth year] DOA: 05/19/2017  PCP: Kerri Perches, MD  Admit date: 05/19/2017 Discharge date: 05/22/2017  Admitted From: Home.  Disposition:  Home.   Recommendations for Outpatient Follow-up:  1. Follow up with PCP in 1-2 weeks 2. Follow up with Dr Blase Mess as soon as possible.    Home Health: None.  Equipment/Devices: None.  Discharge Condition: No DOE, no wheezing, required no oxygen.  CODE STATUS: FULL CODE.  Diet recommendation: Cardiac.   Brief/Interim Summary: Patient was admitted for COPD exacerbation on May 19, 2017 by Dr Ophelia Charter.  As per her H and P:  " [name] is a 61 y.o. male with medical history significant of HTN, COPD (not on home O2), tobacco and ETOH dependence (reportedly stopped both within the last weeks to month) presenting with SOB.  He reports problems for about 2 weeks after he ran out of medications and his nebulizer machine broke.  He called Dr. Anthony Sar office on 5/29 to try to get another one.  +SOB with any kind of exertion.  No h/o home O2.  Slight coughing, worse in the last few days.  Productive of whitish sputum the last few days, since Thursday evening.  No fevers.  No other symptoms.   ED Course: Acute COPD exacerbation - 88% on room air at rest.  Diffuse wheezing.  Diaphoresis and increased WOB just in using the urinal.  Placed on BIPAP with improvement.  Also developed SVT which resolved with vagal maneuvers.  Negative CXR.  Given Solumedrol, Xopenex, Duoneb, and Azithromycin.  HOSPITAL COURSE:  Patient was placed on NIPPV in the ED, and admitted into the SDU.  He was given steroid and Zithromax IV.  He was seen in consultation with Dr Juanetta Gosling and was able to been weaned off Bipap.  His IV steroid was continued, and he was subsequently transferred to the floor.  His oxygen was weaned off to RA, and he tolerated well.  His HTN was controlled, and his transient SVT was felt to be due to  neb did not recur.  He is committed to quitting cigarettes.  His breathing improved to baseline, no wheezing, no DOE, and on RA.  He is stable for discharge, and will be discharge on 2 more tablets of Zithromax, along with rapid oral steroid taper.  He will see his PCP next week, and will need to see Dr Juanetta Gosling in follow up as well.  He will likely need PFT outpatient.   Thank you and Good Day.   Discharge Diagnoses:  Principal Problem:   COPD exacerbation (HCC) Active Problems:   Nicotine dependence, cigarettes, with other nicotine-induced disorders   Essential hypertension   Depression with anxiety   Alcohol addiction (HCC)   SVT (supraventricular tachycardia) (HCC)    Discharge Instructions  Discharge Instructions    Diet - low sodium heart healthy    Complete by:  As directed    Discharge instructions    Complete by:  As directed    Take your medications as instructed.  See Dr Juanetta Gosling outpatient as recommended.  DO NOT RESUME SMOKING>   Increase activity slowly    Complete by:  As directed      Allergies as of 05/22/2017      Reactions   Lisinopril Swelling, Other (See Comments)   Reaction:  Angioedema      Medication List    TAKE these medications   albuterol 108 (90 Base) MCG/ACT inhaler Commonly known  as:  PROVENTIL HFA;VENTOLIN HFA Inhale 2 puffs into the lungs every 6 (six) hours as needed for wheezing or shortness of breath.   aspirin EC 81 MG tablet Take 81 mg by mouth daily.   azithromycin 250 MG tablet Commonly known as:  ZITHROMAX Z-PAK Take one tablet per day for 2 more days.   budesonide-formoterol 160-4.5 MCG/ACT inhaler Commonly known as:  SYMBICORT Inhale 2 puffs into the lungs 2 (two) times daily.   busPIRone 5 MG tablet Commonly known as:  BUSPAR Take 1 tablet (5 mg total) by mouth 2 (two) times daily.   citalopram 20 MG tablet Commonly known as:  CELEXA Take 1 tablet (20 mg total) by mouth daily.   ipratropium-albuterol 0.5-2.5 (3) MG/3ML  Soln Commonly known as:  DUONEB Take 3 mLs by nebulization every 6 (six) hours as needed. What changed:  reasons to take this   predniSONE 10 MG (21) Tbpk tablet Commonly known as:  STERAPRED UNI-PAK 21 TAB Take as per instruction.   triamterene-hydrochlorothiazide 37.5-25 MG tablet Commonly known as:  MAXZIDE-25 Take 1 tablet by mouth daily.            Durable Medical Equipment        Start     Ordered   05/22/17 [phone]  For home use only DME Nebulizer machine  Once    Question:  Patient needs a nebulizer to treat with the following condition  Answer:  COPD (chronic obstructive pulmonary disease) (HCC)   05/22/17 0852   05/20/17 1212  For home use only DME Nebulizer machine  Once    Question:  Patient needs a nebulizer to treat with the following condition  Answer:  COPD (chronic obstructive pulmonary disease) (HCC)   05/20/17 1211      Allergies  Allergen Reactions  . Lisinopril Swelling and Other (See Comments)    Reaction:  Angioedema    Consultations:  Dr Juanetta GoslingHawkins of pulmonary medicine.    Procedures/Studies: Dg Chest Portable 1 View  Result Date: 05/19/2017 CLINICAL DATA:  Dyspnea since Thursday. Denies chest pain. Cough with greenish weight sputum. EXAM: PORTABLE CHEST 1 VIEW COMPARISON:  02/21/2016 CXR FINDINGS: The heart size and mediastinal contours are within normal limits. Both lungs are clear. The visualized skeletal structures are unremarkable. IMPRESSION: No active disease. Electronically Signed   By: Tollie Ethavid  Kwon M.D.   On: 05/19/2017 19:20       Subjective: Feeling well and want to go home.   Discharge Exam: Vitals:   05/21/17 2245 05/22/17 0554  BP: 132/77 128/69  Pulse: 80 78  Resp: 16 16  Temp: 98.3 F (36.8 C) 98 F (36.7 C)   Vitals:   05/22/17 0234 05/22/17 0554 05/22/17 0754 05/22/17 1429  BP:  128/69    Pulse:  78    Resp:  16    Temp:  98 F (36.7 C)    TempSrc:  Oral    SpO2: 93% 94% 94% 94%  Weight:      Height:         General: Pt is alert, awake, not in acute distress Cardiovascular: RRR, S1/S2 +, no rubs, no gallops Respiratory: CTA bilaterally, no wheezing, no rhonchi Abdominal: Soft, NT, ND, bowel sounds + Extremities: no edema, no cyanosis    The results of significant diagnostics from this hospitalization (including imaging, microbiology, ancillary and laboratory) are listed below for reference.     Microbiology: Recent Results (from the past 240 hour(s))  MRSA PCR Screening  Status: None   Collection Time: 05/19/17 10:55 PM  Result Value Ref Range Status   MRSA by PCR NEGATIVE NEGATIVE Final    Comment:        The GeneXpert MRSA Assay (FDA approved for NASAL specimens only), is one component of a comprehensive MRSA colonization surveillance program. It is not intended to diagnose MRSA infection nor to guide or monitor treatment for MRSA infections.     Basic Metabolic Panel:  Recent Labs Lab 05/19/17 1850 05/20/17 0450 05/21/17 0413  NA 143 136 134*  K 4.2 4.0 3.9  CL 101 98* 98*  CO2 29 26 27   GLUCOSE 119* 165* 165*  BUN 8 9 21*  CREATININE 1.02 0.98 1.15  CALCIUM 9.9 9.6 9.0   CBC:  Recent Labs Lab 05/19/17 1850 05/20/17 0450 05/21/17 0413  WBC 7.7 3.8* 15.1*  NEUTROABS 3.2  --   --   HGB 16.2 14.9 14.8  HCT 48.5 45.4 45.1  MCV 87.2 86.6 85.6  PLT 253 255 277   Urinalysis    Component Value Date/Time   COLORURINE YELLOW 02/21/2016 2242   APPEARANCEUR CLEAR 02/21/2016 2242   LABSPEC 1.010 02/21/2016 2242   PHURINE 5.5 02/21/2016 2242   GLUCOSEU NEGATIVE 02/21/2016 2242   HGBUR NEGATIVE 02/21/2016 2242   BILIRUBINUR NEGATIVE 02/21/2016 2242   KETONESUR NEGATIVE 02/21/2016 2242   PROTEINUR NEGATIVE 02/21/2016 2242   NITRITE NEGATIVE 02/21/2016 2242   LEUKOCYTESUR NEGATIVE 02/21/2016 2242  Microbiology Recent Results (from the past 240 hour(s))  MRSA PCR Screening     Status: None   Collection Time: 05/19/17 10:55 PM  Result Value Ref Range  Status   MRSA by PCR NEGATIVE NEGATIVE Final    Comment:        The GeneXpert MRSA Assay (FDA approved for NASAL specimens only), is one component of a comprehensive MRSA colonization surveillance program. It is not intended to diagnose MRSA infection nor to guide or monitor treatment for MRSA infections.     Time coordinating discharge: Over 30 minutes SIGNED:  Houston Siren, MD FACP [facility] 05/22/2017, 2:30 PM   If 7PM-7AM, please contact night-coverage [email or web address] Password [email or web address]

## 2017-05-22 NOTE — Consult Note (Signed)
Consult requested by: [facility], Dr. Conley RollsLe Consult requested for COPD exacerbation:  HPI: This is a 61 year old who came to the hospital with shortness of breath. He was found to have a COPD exacerbation. He was started on BiPAP and had to have that for about 12 hours. He has been on steroids inhaled bronchodilators and he says he feels much better. He says he feels back to baseline now. He has a substantial smoking history of at least 40 pack years and says he stopped when he came into the hospital. He had asthma when he was growing up. He has a history of hypertension. He's not been coughing up any sputum. No nausea vomiting diarrhea. No abdominal pain. No chest pain.  Past Medical History:  Diagnosis Date  . Alcohol abuse   . Asthma   . COPD (chronic obstructive pulmonary disease) (HCC)   . Coronary atherosclerosis    Based on chest CT 2012  . Essential hypertension, benign   . History of neutropenia    Also lymphocytosis - followed by Dr. Zigmund DanielFormanek  . Nicotine addiction      Family History  Problem Relation Age of Onset  . Diabetes Mother   . Hypertension Mother   . Diabetes Father   . Hypertension Sister      Social History   Social History  . Marital status: Single    Spouse name: N/A  . Number of children: N/A  . Years of education: N/A   Occupational History  . unemployed    Social History Main Topics  . Smoking status: Current Every Day Smoker    Packs/day: 1.00    Years: 38.00    Types: Cigarettes  . Smokeless tobacco: Never Used     Comment: quit "about a week ago" - does not think he needs a patch  . Alcohol use 10.8 oz/week    18 Cans of beer per week     Comment: "I slowed down on that too", reports last drank beer about 3 weeks ago  . Drug use: No  . Sexual activity: No   Other Topics Concern  . None   Social History Narrative  . None     ROS: Except as mentioned 10 point review of systems is negative    Objective: Vital signs in  last 24 hours: Temp:  [98 F (36.7 C)-98.7 F (37.1 C)] 98 F (36.7 C) (06/06 0554) Pulse Rate:  [78-82] 78 (06/06 0554) Resp:  [16] 16 (06/06 0554) BP: (128-143)/(69-85) 128/69 (06/06 0554) SpO2:  [93 %-96 %] 94 % (06/06 0754) FiO2 (%):  [92 %] 92 % (06/05 1448) Weight change:  Last BM Date: 05/19/17  Intake/Output from previous day: 06/05 0701 - 06/06 0700 In: 2362.5 [P.O.:720; I.V.:1642.5] Out: 2000 [Urine:2000]  PHYSICAL EXAM Constitutional: He is awake and alert and in no acute distress. Eyes: His pupils react. EOMI. Ears nose mouth and throat: His mucous membranes are moist. His hearing is grossly normal. Cardiovascular: His heart is regular with normal heart sounds. No edema. Respiratory: His respiratory effort is normal. His lungs showed diminished breath sounds prolonged expiratory phase and end-expiratory wheezing. He is moving air well. Gastrointestinal: His abdomen is soft with no masses. Skin: Warm and dry. Musculoskeletal: His muscle strength is normal. Neurological: No focal neurological abnormalities. Psychiatric: Normal mood and affect  Lab Results: Basic Metabolic Panel:  Recent Labs  84/13/2405/04/03 0450 05/21/17 0413  NA 136 134*  K 4.0 3.9  CL 98* 98*  CO2 26 27  GLUCOSE 165* 165*  BUN 9 21*  CREATININE 0.98 1.15  CALCIUM 9.6 9.0   Liver Function Tests: No results for input(s): AST, ALT, ALKPHOS, BILITOT, PROT, ALBUMIN in the last 72 hours. No results for input(s): LIPASE, AMYLASE in the last 72 hours. No results for input(s): AMMONIA in the last 72 hours. CBC:  Recent Labs  05/19/17 1850 05/20/17 0450 05/21/17 0413  WBC 7.7 3.8* 15.1*  NEUTROABS 3.2  --   --   HGB 16.2 14.9 14.8  HCT 48.5 45.4 45.1  MCV 87.2 86.6 85.6  PLT 253 255 277   Cardiac Enzymes: No results for input(s): CKTOTAL, CKMB, CKMBINDEX, TROPONINI in the last 72 hours. BNP: No results for input(s): PROBNP in the last 72 hours. D-Dimer: No results for input(s): DDIMER in the  last 72 hours. CBG: No results for input(s): GLUCAP in the last 72 hours. Hemoglobin A1C: No results for input(s): HGBA1C in the last 72 hours. Fasting Lipid Panel: No results for input(s): CHOL, HDL, LDLCALC, TRIG, CHOLHDL, LDLDIRECT in the last 72 hours. Thyroid Function Tests: No results for input(s): TSH, T4TOTAL, FREET4, T3FREE, THYROIDAB in the last 72 hours. Anemia Panel: No results for input(s): VITAMINB12, FOLATE, FERRITIN, TIBC, IRON, RETICCTPCT in the last 72 hours. Coagulation: No results for input(s): LABPROT, INR in the last 72 hours. Urine Drug Screen: Drugs of Abuse     Component Value Date/Time   LABOPIA NONE DETECTED 05/19/2017 2346   COCAINSCRNUR NONE DETECTED 05/19/2017 2346   LABBENZ NONE DETECTED 05/19/2017 2346   AMPHETMU NONE DETECTED 05/19/2017 2346   THCU POSITIVE (A) 05/19/2017 2346   LABBARB NONE DETECTED 05/19/2017 2346    Alcohol Level:  Recent Labs  05/19/17 2118  ETH <5   Urinalysis: No results for input(s): COLORURINE, LABSPEC, PHURINE, GLUCOSEU, HGBUR, BILIRUBINUR, KETONESUR, PROTEINUR, UROBILINOGEN, NITRITE, LEUKOCYTESUR in the last 72 hours.  Invalid input(s): APPERANCEUR Misc. Labs:   ABGS: No results for input(s): PHART, PO2ART, TCO2, HCO3 in the last 72 hours.  Invalid input(s): PCO2   MICROBIOLOGY: Recent Results (from the past 240 hour(s))  MRSA PCR Screening     Status: None   Collection Time: 05/19/17 10:55 PM  Result Value Ref Range Status   MRSA by PCR NEGATIVE NEGATIVE Final    Comment:        The GeneXpert MRSA Assay (FDA approved for NASAL specimens only), is one component of a comprehensive MRSA colonization surveillance program. It is not intended to diagnose MRSA infection nor to guide or monitor treatment for MRSA infections.     Studies/Results: No results found.  Medications:  Prior to Admission:  Prescriptions Prior to Admission  Medication Sig Dispense Refill Last Dose  . albuterol (PROVENTIL  HFA;VENTOLIN HFA) 108 (90 Base) MCG/ACT inhaler Inhale 2 puffs into the lungs every 6 (six) hours as needed for wheezing or shortness of breath. 1 Inhaler 0 Past Month at Unknown time  . aspirin EC 81 MG tablet Take 81 mg by mouth daily.   05/19/2017 at 0700  . budesonide-formoterol (SYMBICORT) 160-4.5 MCG/ACT inhaler Inhale 2 puffs into the lungs 2 (two) times daily. 3 Inhaler 3 Past Month at Unknown time  . busPIRone (BUSPAR) 5 MG tablet Take 1 tablet (5 mg total) by mouth 2 (two) times daily. 60 tablet 3 05/19/2017 at Unknown time  . citalopram (CELEXA) 20 MG tablet Take 1 tablet (20 mg total) by mouth daily. 30 tablet 5 05/19/2017 at Unknown time  . ipratropium-albuterol (DUONEB) 0.5-2.5 (3) MG/3ML SOLN Take  3 mLs by nebulization every 6 (six) hours as needed. (Patient taking differently: Take 3 mLs by nebulization every 6 (six) hours as needed (for wheezing/shortness of breath). ) 360 mL 3 Past Month at Unknown time  . triamterene-hydrochlorothiazide (MAXZIDE-25) 37.5-25 MG tablet Take 1 tablet by mouth daily. 30 tablet 3 05/19/2017 at Unknown time   Scheduled: . aspirin EC  81 mg Oral Daily  . busPIRone  5 mg Oral BID  . citalopram  20 mg Oral Daily  . docusate sodium  100 mg Oral BID  . enoxaparin (LOVENOX) injection  40 mg Subcutaneous Q24H  . feeding supplement (ENSURE ENLIVE)  237 mL Oral BID BM  . folic acid  1 mg Oral Daily  . ipratropium-albuterol  3 mL Nebulization Q6H  . methylPREDNISolone (SOLU-MEDROL) injection  80 mg Intravenous Q8H  . sodium chloride flush  3 mL Intravenous Q12H  . thiamine  100 mg Oral Daily  . triamterene-hydrochlorothiazide  1 tablet Oral Daily   Continuous: . azithromycin Stopped (05/21/17 2228)  . lactated ringers 75 mL/hr at 05/22/17 0602   JXB:[id number] **OR** acetaminophen, albuterol, ondansetron **OR** ondansetron (ZOFRAN) IV  Assesment: He has COPD exacerbation. He seems to be better. He has history of nicotine dependence and says he has stopped  smoking now. He has history of alcohol addiction hypertension and depression all of which seem to be pretty stable right now. Principal Problem:   COPD exacerbation (HCC) Active Problems:   Nicotine dependence, cigarettes, with other nicotine-induced disorders   Essential hypertension   Depression with anxiety   Alcohol addiction (HCC)   SVT (supraventricular tachycardia) (HCC)    Plan: He is on appropriate treatment. He will need to have outpatient pulmonary function testing. He seems to be approaching baseline.    LOS: 3 days   Lenin Kuhnle L 05/22/2017, 9:41 AM

## 2017-05-22 NOTE — Progress Notes (Signed)
SATURATION QUALIFICATIONS: (This note is used to comply with regulatory documentation for home oxygen)  Patient Saturations on Room Air at Rest = 93%  Patient Saturations on Room Air while Ambulating = 93%    

## 2017-05-22 NOTE — Progress Notes (Signed)
AVS reviewed with patient.  Verbalized understanding of discharge instructions, physician follow-up, medications.  MATCH voucher and prescriptions given to patient.  Patient's IV removed.  Site WNL.  Patient transported by wheelchair to main entrance.  Patient stable at time of discharge.

## 2017-05-22 NOTE — Care Management Note (Signed)
Case Management Note  Patient Details  Name: Noel Geroldrnest D Fye MRN: [id number] Date of Birth: February 26, 1956  Expected Discharge Date:  05/22/17               Expected Discharge Plan:  Home/Self Care  In-House Referral:  NA  Discharge planning Services  CM Consult  Post Acute Care Choice:  Durable Medical Equipment Choice offered to:  Patient  DME Arranged:  Nebulizer machine DME Agency:  [facility] Status of Service:  Completed, signed off  Additional Comments: Pt discharging home today with self care. Neb machine has been delivered. Pt asked about seeing the FC. Pt provided phone number and office hours for [facility]'s financial counselor. MATCH voucher given. No other needs communicated.    Malcolm Metrohildress, Renlee Floor Demske, RN 05/22/2017, 2:54 PM

## 2017-06-06 IMAGING — CR DG CHEST 1V PORT
1 series · 2 of 2 positions shown · non-contrast
Comparison: 02/21/2016 CXR

CLINICAL DATA: Dyspnea since [REDACTED]. Denies chest pain. Cough
with greenish weight sputum.

EXAM:
PORTABLE CHEST 1 VIEW

[Series 1: portable · 0.17mm/px · 2 of 2 slices shown]
[im 1/2]
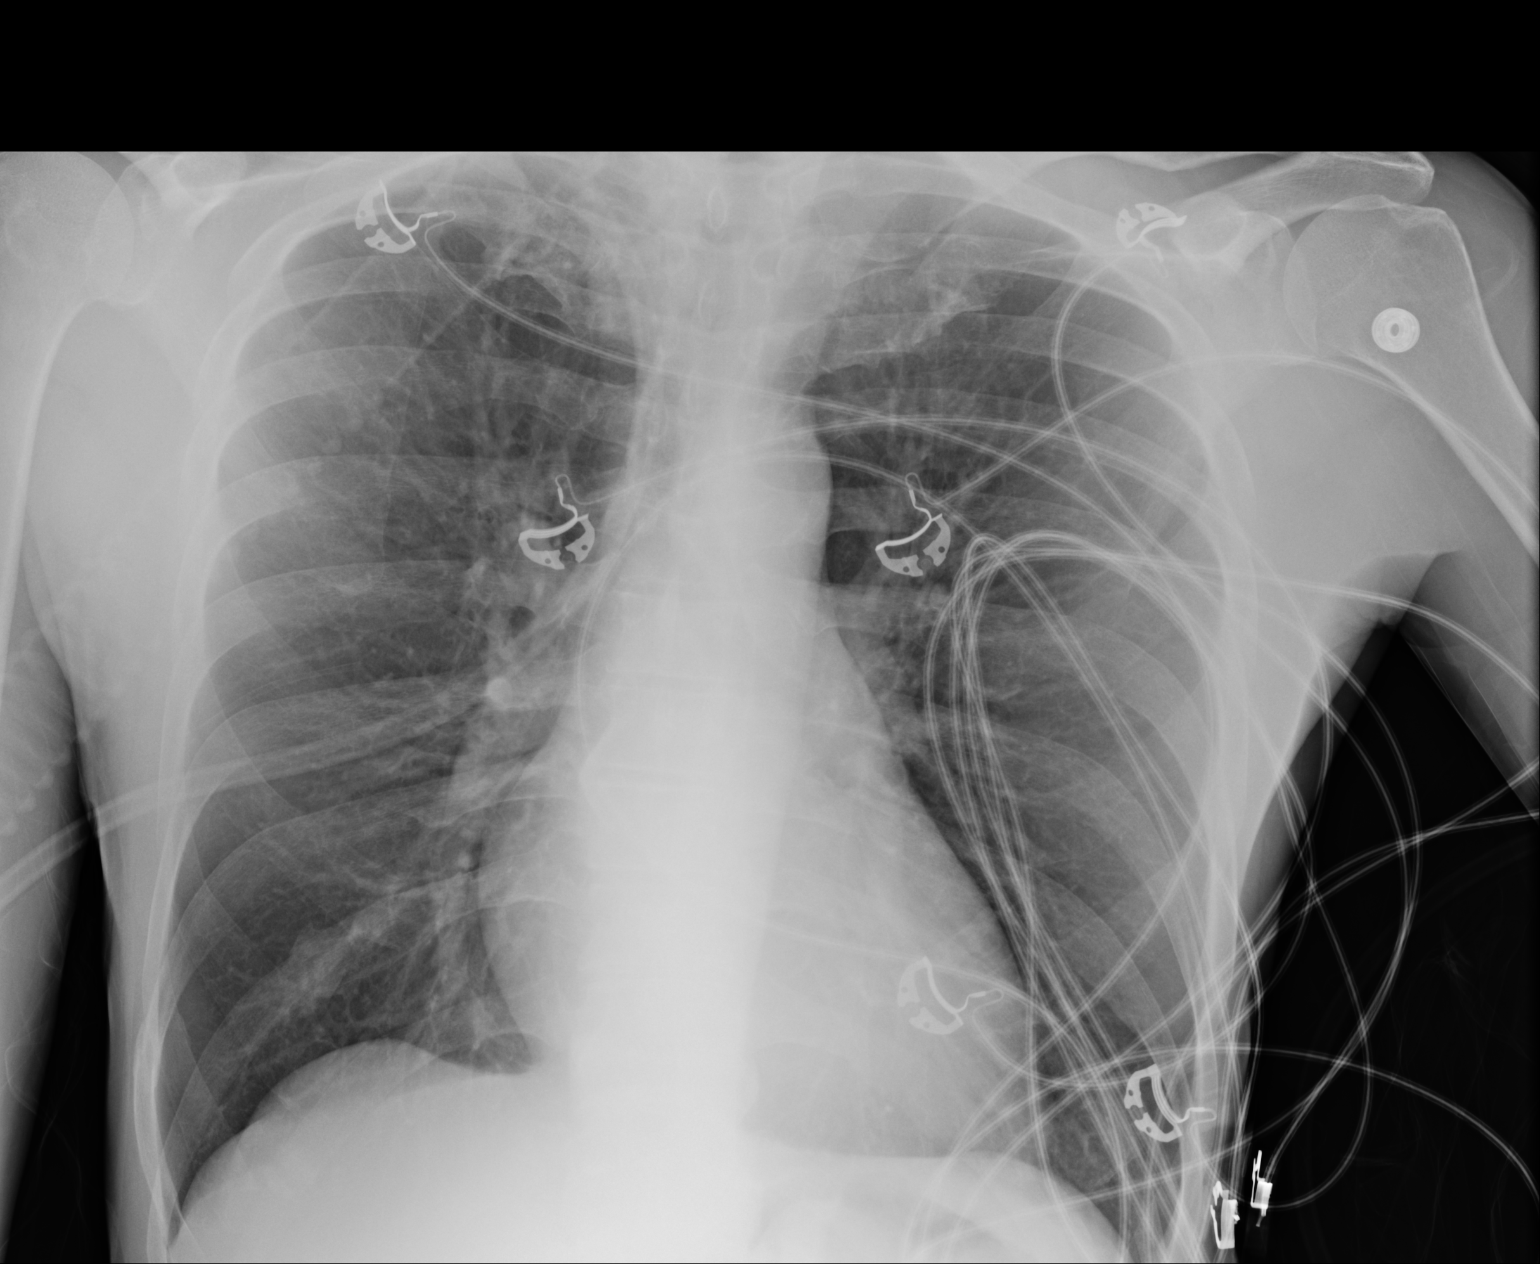
[im 2/2]
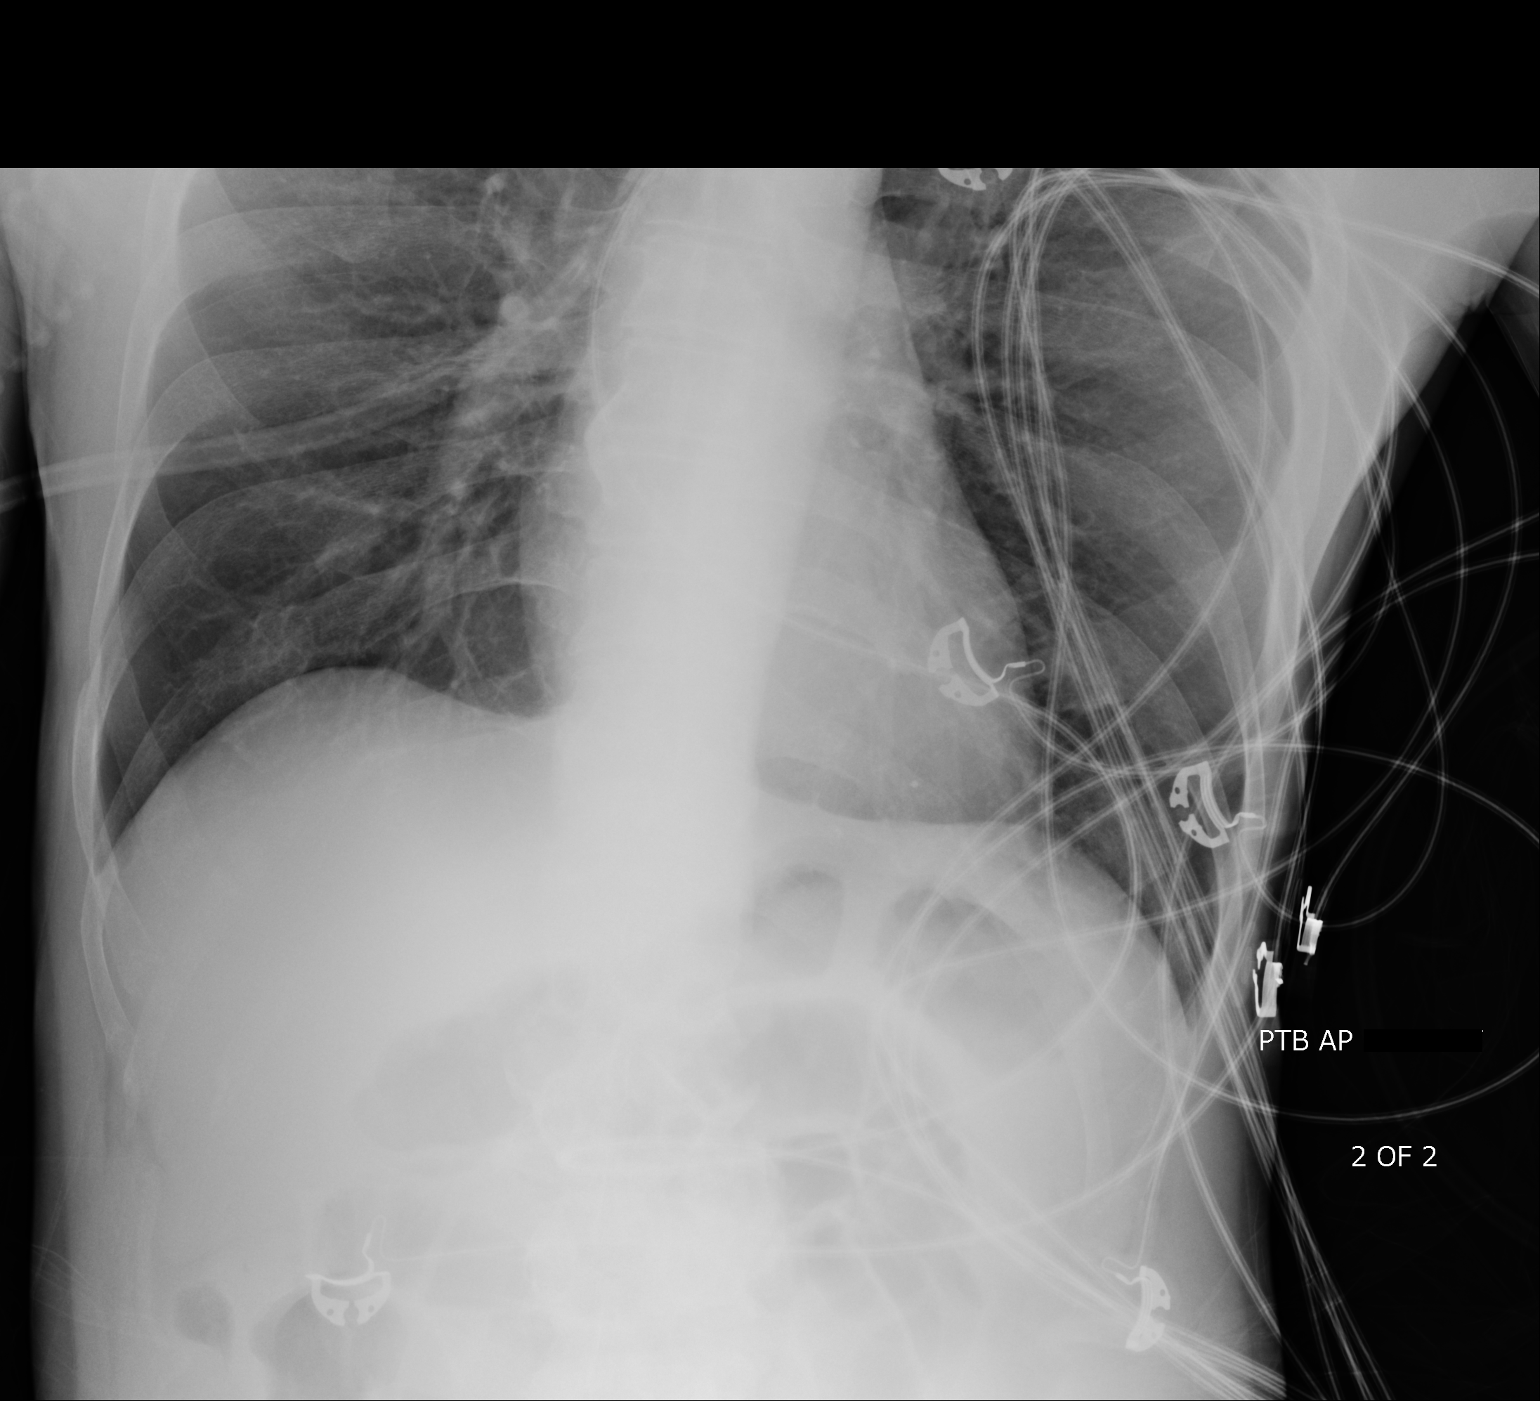

[2 of 2 positions shown; findings below may reference images not displayed]

FINDINGS: The heart size and mediastinal contours are within normal limits.
Both lungs are clear. The visualized skeletal structures are
unremarkable.
IMPRESSION: No active disease.

## 2017-06-11 ENCOUNTER — Ambulatory Visit (INDEPENDENT_AMBULATORY_CARE_PROVIDER_SITE_OTHER): Payer: Self-pay | Admitting: Family Medicine

## 2017-06-11 ENCOUNTER — Encounter: Payer: Self-pay | Admitting: Family Medicine

## 2017-06-11 VITALS — BP 150/100 | HR 82 | Resp 16 | Ht 70.0 in | Wt 150.8 lb

## 2017-06-11 DIAGNOSIS — Z09 Encounter for follow-up examination after completed treatment for conditions other than malignant neoplasm: Secondary | ICD-10-CM

## 2017-06-11 DIAGNOSIS — I10 Essential (primary) hypertension: Secondary | ICD-10-CM

## 2017-06-11 DIAGNOSIS — F17218 Nicotine dependence, cigarettes, with other nicotine-induced disorders: Secondary | ICD-10-CM

## 2017-06-11 NOTE — Assessment & Plan Note (Signed)
Uncontrolled, non cmpliant, given med in office today DASH diet and commitment to daily physical activity for a minimum of 30 minutes discussed and encouraged, as a part of hypertension management. The importance of attaining a healthy weight is also discussed.  BP/Weight 06/11/2017 05/22/2017 05/20/2017 05/14/2017 04/11/2017 12/05/2016 08/01/2016  Systolic BP 150 136 - 122 160 [id number] 140  Diastolic BP 100 89 - 82 100 70 88  Wt. (Lbs) 150.8 - 138.01 - 146 136.04 137  BMI 21.64 - 20.38 - 20.95 19.52 22.11

## 2017-06-11 NOTE — Patient Instructions (Signed)
F/u in October as before  You need to make and keep hospital f/u appt with Dr Juanetta GoslingHawkins  Pls pursue getting medicaid or other insurance, or if no insurance please establish at health department so you ensure that you can get access to medication and chronic disease manageement  Continue to ABSTAIN from alll alcohol including beer , and cigarettes  Thank you  for choosing  [facility]. We consider it a privelige to serve you.  Delivering excellent health care in a caring and  compassionate way is our goal.  Partnering with you,  so that together we can achieve this goal is our strategy.  BP high today , take medication every day please

## 2017-06-11 NOTE — Assessment & Plan Note (Addendum)
No smoke since last week Thursday, refer to pulmonary, reviewed the importance of smoking and alcohol cessation with [name], also medications reviewed and redirected him to the health dept for ongoing access to is chronic meds esp maintenance inhalers , until he is able to secure health ins. Need to see pulmonary is also stresses

## 2017-06-16 NOTE — Assessment & Plan Note (Signed)

## 2017-06-16 NOTE — Progress Notes (Signed)
[name]     MRN: [id number]      DOB: March 28, 1956   HPI [name] is here for follow up of hospitalization from 6/3 to 6/6 when he presented with COPD flare. Still has not seen pulmonary, not has he established at the health department for chronic disease management until he can get onto some form  Of medical insurance. He  Reports that he no longer drinks alcohol, and also smoking. States his goal is to return to full time employment asap. Hospital d/c summary is reviewed at the visit and The fact that ongoing alcohol and nicotine use will only ensure deterioration in his health and early death is stressed, he states he understands this clearly and does not want this for himself  ROS: See HPI Denies current wheezing or excessive shortness of breath,back to baseline. Denies productive cough , fever, chills or sputum production PE  BP (!) 150/100   Pulse 82   Resp 16   Ht 5\' 10"  (1.778 m)   Wt 150 lb 12.8 oz (68.4 kg)   SpO2 99%   BMI 21.64 kg/m   Patient alert and oriented and in no cardiopulmonary distress.  HEENT: No facial asymmetry, EOMI,   oropharynx pink and moist.  Neck supple no JVD, no mass.  Chest: Clear to auscultation bilaterally.Decreased though adeqaute air entry, no crackles or wheezes  CVS: S1, S2 no murmurs, no S3.Regular rate.  ABD: Soft non tender.   Ext: No edema  MS: Adequate ROM spine, shoulders, hips and knees.  Skin: Intact, no ulcerations or rash noted.  Psych: Good eye contact, normal affect. Memory intact not anxious or depressed appearing.  CNS: CN 2-12 intact, power,  normal throughout.no focal deficits noted.   Assessment & Plan  Hospital discharge follow-up No smoke since last week Thursday, refer to pulmonary, reviewed the importance of smoking and alcohol cessation with [name], also medications reviewed and redirected him to the health dept for ongoing access to is chronic meds esp maintenance inhalers , until he is  able to secure health ins. Need to see pulmonary is also stresses  Essential hypertension Uncontrolled, non cmpliant, given med in office today DASH diet and commitment to daily physical activity for a minimum of 30 minutes discussed and encouraged, as a part of hypertension management. The importance of attaining a healthy weight is also discussed.  BP/Weight 06/11/2017 05/22/2017 05/20/2017 05/14/2017 04/11/2017 12/05/2016 08/01/2016  Systolic BP 150 136 - 122 160 [id number] 140  Diastolic BP 100 89 - 82 100 70 88  Wt. (Lbs) 150.8 - 138.01 - 146 136.04 137  BMI 21.64 - 20.38 - 20.95 19.52 22.11       Alcohol addiction (HCC) counseled to d/c all alcohol use  Nicotine dependence, cigarettes, with other nicotine-induced disorders Patient is asked and  confirms current  Nicotine use.  Five to seven minutes of time is spent in counseling the patient of the need to quit smoking  Advice to quit is delivered clearly specifically in reducing the risk of developing heart disease, having a stroke, or of developing all types of cancer, especially lung and oral cancer. Improvement in breathing and exercise tolerance and quality of life is also discussed, as is the economic benefit.  Assessment of willingness to quit or to make an attempt to quit is made and documented  Assistance in quit attempt is made with several and varied options presented, based on patient's desire and need. These include  literature, local classes  available, 1800 QUIT NOW number, OTC and prescription medication.  The GOAL to be NICOTINE FREE is re emphasized.  The patient has set a personal goal of either reduction or discontinuation and follow up is arranged between 6 an 16 weeks.

## 2017-06-16 NOTE — Assessment & Plan Note (Signed)
counseled to d/c all alcohol use

## 2017-08-12 ENCOUNTER — Encounter (HOSPITAL_COMMUNITY): Payer: Self-pay | Admitting: Emergency Medicine

## 2017-08-12 ENCOUNTER — Emergency Department (HOSPITAL_COMMUNITY): Payer: Medicaid Other

## 2017-08-12 ENCOUNTER — Observation Stay (HOSPITAL_COMMUNITY)
Admission: EM | Admit: 2017-08-12 | Discharge: 2017-08-13 | Disposition: A | Discharge status: Home / Self Care | Payer: Medicaid Other | Attending: Internal Medicine | Admitting: Internal Medicine

## 2017-08-12 DIAGNOSIS — F17210 Nicotine dependence, cigarettes, uncomplicated: Secondary | ICD-10-CM | POA: Insufficient documentation

## 2017-08-12 DIAGNOSIS — I10 Essential (primary) hypertension: Secondary | ICD-10-CM | POA: Diagnosis present

## 2017-08-12 DIAGNOSIS — Z79899 Other long term (current) drug therapy: Secondary | ICD-10-CM | POA: Insufficient documentation

## 2017-08-12 DIAGNOSIS — Z7982 Long term (current) use of aspirin: Secondary | ICD-10-CM | POA: Diagnosis not present

## 2017-08-12 DIAGNOSIS — J9601 Acute respiratory failure with hypoxia: Secondary | ICD-10-CM

## 2017-08-12 DIAGNOSIS — J441 Chronic obstructive pulmonary disease with (acute) exacerbation: Principal | ICD-10-CM | POA: Diagnosis present

## 2017-08-12 DIAGNOSIS — R0602 Shortness of breath: Secondary | ICD-10-CM | POA: Diagnosis present

## 2017-08-12 DIAGNOSIS — N179 Acute kidney failure, unspecified: Secondary | ICD-10-CM | POA: Diagnosis present

## 2017-08-12 LAB — COMPREHENSIVE METABOLIC PANEL
ALK PHOS: 67 U/L (ref 38–126)
ALT: 22 U/L (ref 17–63)
AST: 28 U/L (ref 15–41)
Albumin: 4.7 g/dL (ref 3.5–5.0)
Anion gap: 12 (ref 5–15)
BILIRUBIN TOTAL: 1 mg/dL (ref 0.3–1.2)
BUN: 20 mg/dL (ref 6–20)
CHLORIDE: 96 mmol/L — AB (ref 101–111)
CO2: 27 mmol/L (ref 22–32)
CREATININE: 1.76 mg/dL — AB (ref 0.61–1.24)
Calcium: 9.6 mg/dL (ref 8.9–10.3)
GFR calc Af Amer: 47 mL/min — ABNORMAL LOW (ref >60)
GFR, EST NON AFRICAN AMERICAN: 41 mL/min — AB (ref >60)
Glucose, Bld: 103 mg/dL — ABNORMAL HIGH (ref 65–99)
Potassium: 5 mmol/L (ref 3.5–5.1)
Sodium: 135 mmol/L (ref 135–145)
TOTAL PROTEIN: 7.9 g/dL (ref 6.5–8.1)

## 2017-08-12 LAB — CBC WITH DIFFERENTIAL/PLATELET
BASOS ABS: 0.1 10*3/uL (ref 0.0–0.1)
Basophils Relative: 1 %
Eosinophils Absolute: 1.2 10*3/uL — ABNORMAL HIGH (ref 0.0–0.7)
Eosinophils Relative: 14 %
HEMATOCRIT: 54.6 % — AB (ref 39.0–52.0)
HEMOGLOBIN: 18.1 g/dL — AB (ref 13.0–17.0)
LYMPHS PCT: 29 %
Lymphs Abs: 2.5 10*3/uL (ref 0.7–4.0)
MCH: 28.7 pg (ref 26.0–34.0)
MCHC: 33.2 g/dL (ref 30.0–36.0)
MCV: 86.5 fL (ref 78.0–100.0)
Monocytes Absolute: 1 10*3/uL (ref 0.1–1.0)
Monocytes Relative: 12 %
NEUTROS ABS: 3.8 10*3/uL (ref 1.7–7.7)
Neutrophils Relative %: 44 %
Platelets: 302 10*3/uL (ref 150–400)
RBC: 6.31 MIL/uL — ABNORMAL HIGH (ref 4.22–5.81)
RDW: 12.7 % (ref 11.5–15.5)
WBC: 8.5 10*3/uL (ref 4.0–10.5)

## 2017-08-12 MED ORDER — AZITHROMYCIN 250 MG PO TABS
500.0000 mg | ORAL_TABLET | Freq: Every day | ORAL | Status: AC
  Administered 2017-08-12: 500 mg via ORAL
  Filled 2017-08-12: qty 2

## 2017-08-12 MED ORDER — ONDANSETRON HCL 4 MG PO TABS
4.0000 mg | ORAL_TABLET | Freq: Four times a day (QID) | ORAL | Status: DC | PRN
Start: 2017-08-12 — End: 2017-08-13

## 2017-08-12 MED ORDER — ACETAMINOPHEN 325 MG PO TABS: 650.0000 mg | ORAL_TABLET | Freq: Four times a day (QID) | ORAL | Status: DC | PRN

## 2017-08-12 MED ORDER — GUAIFENESIN ER 600 MG PO TB12
1200.0000 mg | ORAL_TABLET | Freq: Two times a day (BID) | ORAL | Status: DC
  Administered 2017-08-12 – 2017-08-13 (×2): 1200 mg via ORAL
  Filled 2017-08-12 (×2): qty 2

## 2017-08-12 MED ORDER — IPRATROPIUM-ALBUTEROL 0.5-2.5 (3) MG/3ML IN SOLN
3.0000 mL | Freq: Once | RESPIRATORY_TRACT | Status: AC
  Administered 2017-08-12: 3 mL via RESPIRATORY_TRACT
  Filled 2017-08-12: qty 3

## 2017-08-12 MED ORDER — SODIUM CHLORIDE 0.9 % IV SOLN
INTRAVENOUS | Status: DC
  Administered 2017-08-12 – 2017-08-13 (×2): via INTRAVENOUS

## 2017-08-12 MED ORDER — ONDANSETRON HCL 4 MG/2ML IJ SOLN: 4.0000 mg | Freq: Four times a day (QID) | INTRAMUSCULAR | Status: DC | PRN

## 2017-08-12 MED ORDER — ASPIRIN EC 81 MG PO TBEC
81.0000 mg | DELAYED_RELEASE_TABLET | Freq: Every day | ORAL | Status: DC
  Administered 2017-08-12 – 2017-08-13 (×2): 81 mg via ORAL
  Filled 2017-08-12 (×2): qty 1

## 2017-08-12 MED ORDER — IPRATROPIUM-ALBUTEROL 0.5-2.5 (3) MG/3ML IN SOLN
3.0000 mL | Freq: Four times a day (QID) | RESPIRATORY_TRACT | Status: DC
  Administered 2017-08-12 – 2017-08-13 (×4): 3 mL via RESPIRATORY_TRACT
  Filled 2017-08-12 (×5): qty 3

## 2017-08-12 MED ORDER — ALBUTEROL SULFATE (2.5 MG/3ML) 0.083% IN NEBU
5.0000 mg | INHALATION_SOLUTION | Freq: Once | RESPIRATORY_TRACT | Status: AC
  Administered 2017-08-12: 5 mg via RESPIRATORY_TRACT
  Filled 2017-08-12: qty 6

## 2017-08-12 MED ORDER — CITALOPRAM HYDROBROMIDE 20 MG PO TABS
20.0000 mg | ORAL_TABLET | Freq: Every day | ORAL | Status: DC
  Administered 2017-08-12 – 2017-08-13 (×2): 20 mg via ORAL
  Filled 2017-08-12 (×2): qty 1

## 2017-08-12 MED ORDER — SODIUM CHLORIDE 0.9 % IV BOLUS (SEPSIS)
1000.0000 mL | Freq: Once | INTRAVENOUS | Status: AC
  Administered 2017-08-12: 1000 mL via INTRAVENOUS

## 2017-08-12 MED ORDER — IPRATROPIUM-ALBUTEROL 0.5-2.5 (3) MG/3ML IN SOLN: 3.0000 mL | Freq: Four times a day (QID) | RESPIRATORY_TRACT | Status: DC | PRN

## 2017-08-12 MED ORDER — ALBUTEROL SULFATE (2.5 MG/3ML) 0.083% IN NEBU
2.5000 mg | INHALATION_SOLUTION | Freq: Once | RESPIRATORY_TRACT | Status: AC
  Administered 2017-08-12: 2.5 mg via RESPIRATORY_TRACT
  Filled 2017-08-12: qty 3

## 2017-08-12 MED ORDER — MOMETASONE FURO-FORMOTEROL FUM 200-5 MCG/ACT IN AERO
2.0000 | INHALATION_SPRAY | Freq: Two times a day (BID) | RESPIRATORY_TRACT | Status: DC
  Administered 2017-08-12 – 2017-08-13 (×2): 2 via RESPIRATORY_TRACT
  Filled 2017-08-12 (×2): qty 8.8

## 2017-08-12 MED ORDER — METHYLPREDNISOLONE SODIUM SUCC 125 MG IJ SOLR
60.0000 mg | Freq: Four times a day (QID) | INTRAMUSCULAR | Status: DC
  Administered 2017-08-12 – 2017-08-13 (×3): 60 mg via INTRAVENOUS
  Filled 2017-08-12 (×3): qty 2

## 2017-08-12 MED ORDER — METHYLPREDNISOLONE SODIUM SUCC 125 MG IJ SOLR
125.0000 mg | Freq: Once | INTRAMUSCULAR | Status: AC
  Administered 2017-08-12: 125 mg via INTRAVENOUS
  Filled 2017-08-12: qty 2

## 2017-08-12 MED ORDER — ENOXAPARIN SODIUM 40 MG/0.4ML ~~LOC~~ SOLN
40.0000 mg | SUBCUTANEOUS | Status: DC
  Administered 2017-08-12: 40 mg via SUBCUTANEOUS
  Filled 2017-08-12: qty 0.4

## 2017-08-12 MED ORDER — AZITHROMYCIN 250 MG PO TABS
250.0000 mg | ORAL_TABLET | Freq: Every day | ORAL | Status: DC
  Administered 2017-08-13: 250 mg via ORAL
  Filled 2017-08-12: qty 1

## 2017-08-12 MED ORDER — BUSPIRONE HCL 5 MG PO TABS
5.0000 mg | ORAL_TABLET | Freq: Two times a day (BID) | ORAL | Status: DC
  Administered 2017-08-12 – 2017-08-13 (×2): 5 mg via ORAL
  Filled 2017-08-12 (×2): qty 1

## 2017-08-12 MED ORDER — ACETAMINOPHEN 650 MG RE SUPP: 650.0000 mg | Freq: Four times a day (QID) | RECTAL | Status: DC | PRN

## 2017-08-12 NOTE — ED Triage Notes (Signed)
Patient complains of shortness of breath x 2 weeks. Denies chest pain.

## 2017-08-12 NOTE — ED Provider Notes (Signed)
[facility]-EMER[facility]ENCY DEPT Provider Note   CSN: [id number] Arrival date & time: 08/12/17  1211     History   Chief Complaint Chief Complaint  Patient presents with  . Shortness of Breath    HPI [name] is a 61 y.o. male.  Patient complains of shortness of breath and wheezing. Patient has a history of COPD and had been admitted once for it   The history is provided by the patient. No language interpreter was used.  Shortness of Breath  This is a new problem. The problem occurs intermittently.The current episode started 2 days ago. The problem has not changed since onset.Associated symptoms include wheezing. Pertinent negatives include no fever, no headaches, no cough, no chest pain, no abdominal pain and no rash. The problem's precipitants include exercise. He has tried ipratropium inhalers for the symptoms. The treatment provided mild relief. He has had prior hospitalizations. He has had prior ED visits. He has had no prior ICU admissions. Associated medical issues include asthma and COPD.    Past Medical History:  Diagnosis Date  . Alcohol abuse   . Asthma   . COPD (chronic obstructive pulmonary disease) (HCC)   . Coronary atherosclerosis    Based on chest CT 2012  . Essential hypertension, benign   . History of neutropenia    Also lymphocytosis - followed by Dr. Zigmund Daniel  . Nicotine addiction     Patient Active Problem List   Diagnosis Date Noted  . COPD exacerbation (HCC) 08/12/2017  . Hospital discharge follow-up 06/11/2017  . SVT (supraventricular tachycardia) (HCC) 05/19/2017  . GAD (generalized anxiety disorder) 08/01/2016  . Centrilobular emphysema (HCC) 11/01/2015  . Alcohol addiction (HCC) 06/23/2015  . Underweight 03/24/2015  . Vitamin D deficiency 03/24/2015  . Coronary atherosclerosis 03/11/2014  . Nonspecific abnormal electrocardiogram (ECG) (EKG) 02/17/2014  . Depression with anxiety 02/10/2013  . Neutropenia (HCC) 04/07/2012  . Lymphocytosis  11/01/2011  . ALLERGIC RHINITIS CAUSE UNSPECIFIED 01/10/2011  . MALNUTRITION, MILD 11/27/2010  . Nicotine dependence, cigarettes, with other nicotine-induced disorders 03/30/2008  . Essential hypertension 03/30/2008  . COPD 03/30/2008    Past Surgical History:  Procedure Laterality Date  . MULTIPLE TOOTH EXTRACTIONS  June 2012       Home Medications    Prior to Admission medications   Medication Sig Start Date End Date Taking? Authorizing Provider  albuterol (PROVENTIL HFA;VENTOLIN HFA) 108 (90 Base) MCG/ACT inhaler Inhale 2 puffs into the lungs every 6 (six) hours as needed for wheezing or shortness of breath.   Yes Kerri Perches, MD  aspirin EC 81 MG tablet Take 81 mg by mouth daily.   Yes [provider]  budesonide-formoterol (SYMBICORT) 160-4.5 MCG/ACT inhaler Inhale 2 puffs into the lungs 2 (two) times daily. 05/22/17  Yes Houston Siren, MD  busPIRone (BUSPAR) 5 MG tablet Take 1 tablet (5 mg total) by mouth 2 (two) times daily. 04/11/17  Yes Kerri Perches, MD  citalopram (CELEXA) 20 MG tablet Take 1 tablet (20 mg total) by mouth daily. 04/11/17  Yes Kerri Perches, MD  ipratropium-albuterol (DUONEB) 0.5-2.5 (3) MG/3ML SOLN Take 3 mLs by nebulization every 6 (six) hours as needed. Patient taking differently: Take 3 mLs by nebulization every 6 (six) hours as needed (for wheezing/shortness of breath).  04/11/17  Yes Kerri Perches, MD  triamterene-hydrochlorothiazide (MAXZIDE-25) 37.5-25 MG tablet Take 1 tablet by mouth daily. 04/11/17  Yes Kerri Perches, MD    Family History Family History  Problem Relation Age of  Onset  . Diabetes Mother   . Hypertension Mother   . Diabetes Father   . Hypertension Sister     Social History Social History  Substance Use Topics  . Smoking status: Current Every Day Smoker    Packs/day: 1.00    Years: 38.00    Types: Cigarettes  . Smokeless tobacco: Never Used     Comment: quit "about a week ago" - does not  think he needs a patch  . Alcohol use 10.8 oz/week    18 Cans of beer per week     Comment: "I slowed down on that too", reports last drank beer about 3 weeks ago     Allergies   Lisinopril   Review of Systems Review of Systems  Constitutional: Negative for appetite change, fatigue and fever.  HENT: Negative for congestion, ear discharge and sinus pressure.   Eyes: Negative for discharge.  Respiratory: Positive for shortness of breath and wheezing. Negative for cough.   Cardiovascular: Negative for chest pain.  Gastrointestinal: Negative for abdominal pain and diarrhea.  Genitourinary: Negative for frequency and hematuria.  Musculoskeletal: Negative for back pain.  Skin: Negative for rash.  Neurological: Negative for seizures and headaches.  Psychiatric/Behavioral: Negative for hallucinations.     Physical Exam Updated Vital Signs BP (!) 137/109   Pulse 98   Temp (!) 97 F (36.1 C) (Oral)   Resp (!) 22   Ht 5\' 6"  (1.676 m)   Wt 68 kg (150 lb)   SpO2 (S) (!) 87% Comment: Dr. Estell Harpin notified  BMI 24.21 kg/m   Physical Exam  Constitutional: He is oriented to person, place, and time. He appears well-developed.  HENT:  Head: Normocephalic.  Eyes: Conjunctivae and EOM are normal. No scleral icterus.  Neck: Neck supple. No thyromegaly present.  Cardiovascular: Normal rate and regular rhythm.  Exam reveals no gallop and no friction rub.   No murmur heard. Pulmonary/Chest: No stridor. He has wheezes. He has no rales. He exhibits no tenderness.  Abdominal: He exhibits no distension. There is no tenderness. There is no rebound.  Musculoskeletal: Normal range of motion. He exhibits no edema.  Lymphadenopathy:    He has no cervical adenopathy.  Neurological: He is oriented to person, place, and time. He exhibits normal muscle tone. Coordination normal.  Skin: No rash noted. No erythema.  Psychiatric: He has a normal mood and affect. His behavior is normal.     ED  Treatments / Results  Labs (all labs ordered are listed, but only abnormal results are displayed) Labs Reviewed  CBC WITH DIFFERENTIAL/PLATELET - Abnormal; Notable for the following:       Result Value   RBC 6.31 (*)    Hemoglobin 18.1 (*)    HCT 54.6 (*)    Eosinophils Absolute 1.2 (*)    All other components within normal limits  COMPREHENSIVE METABOLIC PANEL - Abnormal; Notable for the following:    Chloride 96 (*)    Glucose, Bld 103 (*)    Creatinine, Ser 1.76 (*)    GFR calc non Af Amer 41 (*)    GFR calc Af Amer 47 (*)    All other components within normal limits    EKG  EKG Interpretation  Date/Time:  Monday August 12 2017 12:21:52 EDT Ventricular Rate:  113 PR Interval:  130 QRS Duration: 76 QT Interval:  352 QTC Calculation: 482 R Axis:   62 Text Interpretation:  Sinus tachycardia with occasional Premature ventricular complexes Biatrial enlargement Septal  infarct , age undetermined Abnormal ECG Confirmed by Bethann Berkshire [phone]) on 08/12/2017 4:20:04 PM       Radiology Dg Chest 2 View  Result Date: 08/12/2017 CLINICAL DATA:  Two weeks of shortness of breath. History of asthma -COPD, current smoker, coronary artery disease. EXAM: CHEST  2 VIEW COMPARISON:  Portable chest x-ray of May 19, 2017 FINDINGS: The lungs remain hyperinflated. There is no focal infiltrate. There is no pneumothorax or pleural effusion. The heart and pulmonary vascularity are normal. The mediastinum is normal in width. There is calcification in the wall of the aortic arch. The bony thorax exhibits no acute abnormality. IMPRESSION: COPD-reactive airway disease. No pneumonia nor other acute cardiopulmonary abnormality. Thoracic aortic atherosclerosis. Electronically Signed   By: David  [place] M.D.   On: 08/12/2017 13:11    Procedures Procedures (including critical care time)  Medications Ordered in ED Medications  albuterol (PROVENTIL) (2.5 MG/3ML) 0.083% nebulizer solution 5 mg (5 mg  Nebulization Given 08/12/17 1229)  ipratropium-albuterol (DUONEB) 0.5-2.5 (3) MG/3ML nebulizer solution 3 mL (3 mLs Nebulization Given 08/12/17 1358)  albuterol (PROVENTIL) (2.5 MG/3ML) 0.083% nebulizer solution 2.5 mg (2.5 mg Nebulization Given 08/12/17 1358)  methylPREDNISolone sodium succinate (SOLU-MEDROL) 125 mg/2 mL injection 125 mg (125 mg Intravenous Given 08/12/17 1346)  sodium chloride 0.9 % bolus 1,000 mL (0 mLs Intravenous Stopped 08/12/17 1612)  ipratropium-albuterol (DUONEB) 0.5-2.5 (3) MG/3ML nebulizer solution 3 mL (3 mLs Nebulization Given 08/12/17 1514)  albuterol (PROVENTIL) (2.5 MG/3ML) 0.083% nebulizer solution 2.5 mg (2.5 mg Nebulization Given 08/12/17 1514)     Initial Impression / Assessment and Plan / ED Course  I have reviewed the triage vital signs and the nursing notes.  Pertinent labs & imaging results that were available during my care of the patient were reviewed by me and considered in my medical decision making (see chart for details).     Patient improved some with neb treatments. Although he was still having mild to moderate wheezing. His O2 sats dropped down to 86% on room air. He will be admitted to medicine for further treatment  Final Clinical Impressions(s) / ED Diagnoses   Final diagnoses:  COPD exacerbation (HCC)    New Prescriptions New Prescriptions   No medications on file     Bethann Berkshire, MD 08/12/17 [phone]

## 2017-08-12 NOTE — ED Notes (Signed)
Started pt on 2L [place]. Pt now 92% on 2L.

## 2017-08-12 NOTE — H&P (Signed)
History and Physical    [name]:[id number] DOB: 06-15-1956 DOA: 08/12/2017  PCP: Kerri Perches, MD Patient coming from: home  I have personally briefly reviewed patient's old medical records in [facility]  Chief Complaint: Shortness of breath  HPI: [name] is a 61 y.o. male with medical history significant of COPD, hypertension, presents to the hospital with progressive shortness of breath. Patient reports worsening shortness of breath over the past 2 weeks. His breathing got significantly worse over the past 24 hours. He's had intermittent wheezing, cough productive of brownish colored sputum. He still feverish although has not checked his temperature. This is worse on exertion. He's been using albuterol nebulizer without significant benefit. He has not had any sick contacts. The patient quit smoking approximately 4 months ago. Patient was smoking for the past 30 years. He also worked in a [employer] for 30 years.  ED Course: Patient was noted to be hypoxic on room air. He received an hour-long neb, but continued to wheeze and was short of breath. Chest x-ray did not show any signs of pneumonia. He was noted to have elevated creatinine at 1.7. He is admitted for further evaluation of COPD exacerbation.  Review of Systems: As per HPI otherwise 10 point review of systems negative.    Past Medical History:  Diagnosis Date  . Alcohol abuse   . Asthma   . COPD (chronic obstructive pulmonary disease) (HCC)   . Coronary atherosclerosis    Based on chest CT 2012  . Essential hypertension, benign   . History of neutropenia    Also lymphocytosis - followed by Dr. Zigmund Daniel  . Nicotine addiction     Past Surgical History:  Procedure Laterality Date  . MULTIPLE TOOTH EXTRACTIONS  June 2012     reports that he has been smoking Cigarettes.  He has a 38.00 pack-year smoking history. He has never used smokeless tobacco. He reports that he drinks about 10.8 oz  of alcohol per week . He reports that he does not use drugs.  Allergies  Allergen Reactions  . Lisinopril Swelling and Other (See Comments)    Reaction:  Angioedema    Family History  Problem Relation Age of Onset  . Diabetes Mother   . Hypertension Mother   . Diabetes Father   . Hypertension Sister      Prior to Admission medications   Medication Sig Start Date End Date Taking? Authorizing Provider  albuterol (PROVENTIL HFA;VENTOLIN HFA) 108 (90 Base) MCG/ACT inhaler Inhale 2 puffs into the lungs every 6 (six) hours as needed for wheezing or shortness of breath.   Yes Kerri Perches, MD  aspirin EC 81 MG tablet Take 81 mg by mouth daily.   Yes [provider]  budesonide-formoterol (SYMBICORT) 160-4.5 MCG/ACT inhaler Inhale 2 puffs into the lungs 2 (two) times daily. 05/22/17  Yes Houston Siren, MD  busPIRone (BUSPAR) 5 MG tablet Take 1 tablet (5 mg total) by mouth 2 (two) times daily. 04/11/17  Yes Kerri Perches, MD  citalopram (CELEXA) 20 MG tablet Take 1 tablet (20 mg total) by mouth daily. 04/11/17  Yes Kerri Perches, MD  ipratropium-albuterol (DUONEB) 0.5-2.5 (3) MG/3ML SOLN Take 3 mLs by nebulization every 6 (six) hours as needed. Patient taking differently: Take 3 mLs by nebulization every 6 (six) hours as needed (for wheezing/shortness of breath).  04/11/17  Yes Kerri Perches, MD  triamterene-hydrochlorothiazide (MAXZIDE-25) 37.5-25 MG tablet Take 1 tablet by mouth daily.  04/11/17  Yes Kerri Perches, MD    Physical Exam: Vitals:   08/12/17 1609 08/12/17 1630 08/12/17 1800 08/12/17 1855  BP: (!) 137/109 (!) 132/103 (!) 145/98 (!) 143/97  Pulse: 98 95 (!) 102 (!) 103  Resp: (!) 22 (!) 22 (!) 23 (!) 22  Temp:    98.5 F (36.9 C)  TempSrc:    Oral  SpO2: (S) (!) 87% 92% 95% 94%  Weight:    63.1 kg (139 lb 1.8 oz)  Height:    5\' 11"  (1.803 m)    Constitutional: NAD, calm, comfortable Vitals:   08/12/17 1609 08/12/17 1630 08/12/17 1800  08/12/17 1855  BP: (!) 137/109 (!) 132/103 (!) 145/98 (!) 143/97  Pulse: 98 95 (!) 102 (!) 103  Resp: (!) 22 (!) 22 (!) 23 (!) 22  Temp:    98.5 F (36.9 C)  TempSrc:    Oral  SpO2: (S) (!) 87% 92% 95% 94%  Weight:    63.1 kg (139 lb 1.8 oz)  Height:    5\' 11"  (1.803 m)   Eyes: PERRL, lids and conjunctivae normal ENMT: Mucous membranes are moist. Posterior pharynx clear of any exudate or lesions.Normal dentition.  Neck: normal, supple, no masses, no thyromegaly Respiratory:Diminished breath sounds with wheezes bilaterally Normal respiratory effort. No accessory muscle use.  Cardiovascular: Regular rate and rhythm, no murmurs / rubs / gallops. No extremity edema. 2+ pedal pulses. No carotid bruits.  Abdomen: no tenderness, no masses palpated. No hepatosplenomegaly. Bowel sounds positive.  Musculoskeletal: no clubbing / cyanosis. No joint deformity upper and lower extremities. Good ROM, no contractures. Normal muscle tone.  Skin: no rashes, lesions, ulcers. No induration Neurologic: CN 2-12 grossly intact. Sensation intact, DTR normal. Strength 5/5 in all 4.  Psychiatric: Normal judgment and insight. Alert and oriented x 3. Normal mood.   Labs on Admission: I have personally reviewed following labs and imaging studies  CBC:  Recent Labs Lab 08/12/17 1343  WBC 8.5  NEUTROABS 3.8  HGB 18.1*  HCT 54.6*  MCV 86.5  PLT 302   Basic Metabolic Panel:  Recent Labs Lab 08/12/17 1343  NA 135  K 5.0  CL 96*  CO2 27  GLUCOSE 103*  BUN 20  CREATININE 1.76*  CALCIUM 9.6   GFR: Estimated Creatinine Clearance: 40.8 mL/min (A) (by C-G formula based on SCr of 1.76 mg/dL (H)). Liver Function Tests:  Recent Labs Lab 08/12/17 1343  AST 28  ALT 22  ALKPHOS 67  BILITOT 1.0  PROT 7.9  ALBUMIN 4.7   No results for input(s): LIPASE, AMYLASE in the last 168 hours. No results for input(s): AMMONIA in the last 168 hours. Coagulation Profile: No results for input(s): INR, PROTIME in  the last 168 hours. Cardiac Enzymes: No results for input(s): CKTOTAL, CKMB, CKMBINDEX, TROPONINI in the last 168 hours. BNP (last 3 results) No results for input(s): PROBNP in the last 8760 hours. HbA1C: No results for input(s): HGBA1C in the last 72 hours. CBG: No results for input(s): GLUCAP in the last 168 hours. Lipid Profile: No results for input(s): CHOL, HDL, LDLCALC, TRIG, CHOLHDL, LDLDIRECT in the last 72 hours. Thyroid Function Tests: No results for input(s): TSH, T4TOTAL, FREET4, T3FREE, THYROIDAB in the last 72 hours. Anemia Panel: No results for input(s): VITAMINB12, FOLATE, FERRITIN, TIBC, IRON, RETICCTPCT in the last 72 hours. Urine analysis:    Component Value Date/Time   COLORURINE YELLOW 02/21/2016 2242   APPEARANCEUR CLEAR 02/21/2016 2242   LABSPEC 1.010 02/21/2016  2242   PHURINE 5.5 02/21/2016 2242   GLUCOSEU NEGATIVE 02/21/2016 2242   HGBUR NEGATIVE 02/21/2016 2242   BILIRUBINUR NEGATIVE 02/21/2016 2242   KETONESUR NEGATIVE 02/21/2016 2242   PROTEINUR NEGATIVE 02/21/2016 2242   NITRITE NEGATIVE 02/21/2016 2242   LEUKOCYTESUR NEGATIVE 02/21/2016 2242    Radiological Exams on Admission: Dg Chest 2 View  Result Date: 08/12/2017 CLINICAL DATA:  Two weeks of shortness of breath. History of asthma -COPD, current smoker, coronary artery disease. EXAM: CHEST  2 VIEW COMPARISON:  Portable chest x-ray of May 19, 2017 FINDINGS: The lungs remain hyperinflated. There is no focal infiltrate. There is no pneumothorax or pleural effusion. The heart and pulmonary vascularity are normal. The mediastinum is normal in width. There is calcification in the wall of the aortic arch. The bony thorax exhibits no acute abnormality. IMPRESSION: COPD-reactive airway disease. No pneumonia nor other acute cardiopulmonary abnormality. Thoracic aortic atherosclerosis. Electronically Signed   By: David  [place] M.D.   On: 08/12/2017 13:11    EKG: Independently reviewed. Sinus  tachycardia  Assessment/Plan Active Problems:   Essential hypertension   COPD exacerbation (HCC)   Acute respiratory failure with hypoxia (HCC)   AKI (acute kidney injury) (HCC)     1. COPD exacerbation. Start the patient on nebulizer treatments, intravenous steroids and antibiotics. Continue pulmonary hygiene. 2. Acute respiratory failure with hypoxia. Wean off oxygen as tolerated. 3. Acute kidney injury. Suspect this is prerenal related to dehydration. Start on IV fluids. Hold further diuretics. If creatinine fails to improve, can consider further workup. 4. Hypertension. Continue outpatient regimen.  DVT prophylaxis: lovenox Code Status: full code Family Communication:  No family present Disposition Plan: discharge home once improved Consults called:  Admission status: observation, [name] MD [facility] Pager [phone]  If 7PM-7AM, please contact night-coverage [email or web address] Password [facility]  08/12/2017, 7:04 PM

## 2017-08-12 NOTE — ED Notes (Signed)
Respiratory giving treatment  

## 2017-08-12 NOTE — ED Notes (Signed)
EKG given to Dr. Zammit 

## 2017-08-12 NOTE — ED Notes (Signed)
Pt placed back on 2 luiters of O2

## 2017-08-13 LAB — CBC
HCT: 47.3 % (ref 39.0–52.0)
HEMOGLOBIN: 15.9 g/dL (ref 13.0–17.0)
MCH: 28.4 pg (ref 26.0–34.0)
MCHC: 33.6 g/dL (ref 30.0–36.0)
MCV: 84.6 fL (ref 78.0–100.0)
Platelets: 281 10*3/uL (ref 150–400)
RBC: 5.59 MIL/uL (ref 4.22–5.81)
RDW: 12.6 % (ref 11.5–15.5)
WBC: 7.2 10*3/uL (ref 4.0–10.5)

## 2017-08-13 LAB — BASIC METABOLIC PANEL
ANION GAP: 9 (ref 5–15)
BUN: 24 mg/dL — ABNORMAL HIGH (ref 6–20)
CALCIUM: 8.6 mg/dL — AB (ref 8.9–10.3)
CO2: 25 mmol/L (ref 22–32)
Chloride: 99 mmol/L — ABNORMAL LOW (ref 101–111)
Creatinine, Ser: 1.22 mg/dL (ref 0.61–1.24)
Glucose, Bld: 162 mg/dL — ABNORMAL HIGH (ref 65–99)
Potassium: 3.9 mmol/L (ref 3.5–5.1)
SODIUM: 133 mmol/L — AB (ref 135–145)

## 2017-08-13 MED ORDER — PREDNISONE 10 MG PO TABS: ORAL_TABLET | ORAL | 0 refills | Status: DC

## 2017-08-13 MED ORDER — AZITHROMYCIN 250 MG PO TABS: 250.0000 mg | ORAL_TABLET | Freq: Every day | ORAL | 0 refills | Status: DC

## 2017-08-13 MED ORDER — BUDESONIDE-FORMOTEROL FUMARATE 160-4.5 MCG/ACT IN AERO: 2.0000 | INHALATION_SPRAY | Freq: Two times a day (BID) | RESPIRATORY_TRACT | 3 refills | Status: DC

## 2017-08-13 NOTE — Progress Notes (Signed)
SATURATION QUALIFICATIONS: (This note is used to comply with regulatory documentation for home oxygen)  Patient Saturations on Room Air at Rest = 85%  Patient Saturations on 2 Liters of oxygen at rest = 96%  Please briefly explain why patient needs home oxygen: Patient is unable to maintain oxygen saturation above 90% on his own. Patient would benefit from oxygen at home in case of hypoxia.

## 2017-08-13 NOTE — Care Management Note (Signed)
Case Management Note  Patient Details  Name: [name] MRN: [id number] Date of Birth: 04-03-1956  Subjective/Objective:                  Pt admitted with COPD exacerbation. From home, ind with ADL's. He has no insurance, PCP is margaret simpson who he see's regularly. Pt has neb machine and was given [facility] voucher last admission in June 2018. Pt will need home oxygen at DC.   Action/Plan: [facility] rep, Linda, aware of referral and will obtain pt info from chart and deliver DME to pt room prior to DC. Pt not eligible for [facility] voucher this time as it has no been a year.   Expected Discharge Date:    08/13/2017              Expected Discharge Plan:  Home/Self Care  In-House Referral:  NA  Discharge planning Services  CM Consult  Post Acute Care Choice:  Durable Medical Equipment Choice offered to:  Patient  DME Arranged:  Oxygen DME Agency:  [facility]  Status of Service:  Completed, signed off  Malcolm Metro, RN 08/13/2017, 2:41 PM

## 2017-08-13 NOTE — Discharge Summary (Signed)
Physician Discharge Summary  [name] ZOX:[id number] DOB: December 02, 1956 DOA: 08/12/2017  PCP: Kerri Perches, MD  Admit date: 08/12/2017 Discharge date: 08/13/2017  Admitted From: Home Disposition:  Home  Recommendations for Outpatient Follow-up:  1. Follow up with PCP in 1-2 weeks 2. Please obtain BMP/CBC in one week 3. Triamterene/hydrochlorothiazide discontinued due to elevated creatinine  Home Health: Equipment/Devices: oxygen 2L  Discharge Condition: stable CODE STATUS:full code Diet recommendation: Heart Healthy   Brief/Interim Summary: 61 year old male with a history of COPD, hypertension, presented to the hospital with progressive shortness of breath. He is found to have COPD exacerbation. Treated with intravenous steroids, antibiotics and bronchodilators. The following day after admission, he felt that his respiratory status has significantly improved. He was able to breathe comfortably and able to ambulate. When oxygen was removed, he was noted to desaturate on ambulation, qualifying for supplemental oxygen at home. This can be weaned off as an outpatient. He'll be sent home on a prednisone taper, course of antibiotics and continued on bronchodilators. Of note, he was noted to be dehydrated on admission and had elevated creatinine. Triamterene/hydrochlorothiazide was held on admission and he was given IV fluids. Renal function is better at time of discharge. We'll continue to hold diuretics until he follows up with his primary care physician. Patient otherwise feels comfortable for discharge home today.  Discharge Diagnoses:  Active Problems:   Essential hypertension   COPD exacerbation (HCC)   Acute respiratory failure with hypoxia (HCC)   AKI (acute kidney injury) [facility])    Discharge Instructions  Discharge Instructions    Diet - low sodium heart healthy    Complete by:  As directed    Increase activity slowly    Complete by:  As directed      Allergies as  of 08/13/2017      Reactions   Lisinopril Swelling, Other (See Comments)   Reaction:  Angioedema      Medication List    STOP taking these medications   triamterene-hydrochlorothiazide 37.5-25 MG tablet Commonly known as:  MAXZIDE-25     TAKE these medications   albuterol 108 (90 Base) MCG/ACT inhaler Commonly known as:  PROVENTIL HFA;VENTOLIN HFA Inhale 2 puffs into the lungs every 6 (six) hours as needed for wheezing or shortness of breath.   aspirin EC 81 MG tablet Take 81 mg by mouth daily.   azithromycin 250 MG tablet Commonly known as:  ZITHROMAX Take 1 tablet (250 mg total) by mouth daily.   budesonide-formoterol 160-4.5 MCG/ACT inhaler Commonly known as:  SYMBICORT Inhale 2 puffs into the lungs 2 (two) times daily.   busPIRone 5 MG tablet Commonly known as:  BUSPAR Take 1 tablet (5 mg total) by mouth 2 (two) times daily.   citalopram 20 MG tablet Commonly known as:  CELEXA Take 1 tablet (20 mg total) by mouth daily.   ipratropium-albuterol 0.5-2.5 (3) MG/3ML Soln Commonly known as:  DUONEB Take 3 mLs by nebulization every 6 (six) hours as needed. What changed:  reasons to take this   predniSONE 10 MG tablet Commonly known as:  DELTASONE Take 40mg  po daily for 2 days then 30mg  daily for 2 days then 20mg  daily for 2 days then 10mg  daily for 2 days then stop            Durable Medical Equipment        Start     Ordered   08/13/17 1505  For home use only DME oxygen  Once  Question Answer Comment  Mode or (Route) Nasal cannula   Liters per Minute 2   Frequency Continuous (stationary and portable oxygen unit needed)   Oxygen conserving device Yes   Oxygen delivery system Gas      08/13/17 1504       Discharge Care Instructions        Start     Ordered   08/13/17 0000  azithromycin (ZITHROMAX) 250 MG tablet  Daily     08/13/17 1636   08/13/17 0000  budesonide-formoterol (SYMBICORT) 160-4.5 MCG/ACT inhaler  2 times daily     08/13/17 1636    08/13/17 0000  predniSONE (DELTASONE) 10 MG tablet     08/13/17 1636   08/13/17 0000  Increase activity slowly     08/13/17 1636   08/13/17 0000  Diet - low sodium heart healthy     08/13/17 1636      Allergies  Allergen Reactions  . Lisinopril Swelling and Other (See Comments)    Reaction:  Angioedema    Consultations:     Procedures/Studies: Dg Chest 2 View  Result Date: 08/12/2017 CLINICAL DATA:  Two weeks of shortness of breath. History of asthma -COPD, current smoker, coronary artery disease. EXAM: CHEST  2 VIEW COMPARISON:  Portable chest x-ray of May 19, 2017 FINDINGS: The lungs remain hyperinflated. There is no focal infiltrate. There is no pneumothorax or pleural effusion. The heart and pulmonary vascularity are normal. The mediastinum is normal in width. There is calcification in the wall of the aortic arch. The bony thorax exhibits no acute abnormality. IMPRESSION: COPD-reactive airway disease. No pneumonia nor other acute cardiopulmonary abnormality. Thoracic aortic atherosclerosis. Electronically Signed   By: David  [place] M.D.   On: 08/12/2017 13:11       Subjective: Feels everything is improving. Less cough. Wheezing is better. Able to ambulate with oxygen.  Discharge Exam: Vitals:   08/13/17 1303 08/13/17 1412  BP: 111/75   Pulse: 84   Resp: 18   Temp: 98.9 F (37.2 C)   SpO2: 96% (!) 85%   Vitals:   08/13/17 0735 08/13/17 0743 08/13/17 1303 08/13/17 1412  BP:   111/75   Pulse:   84   Resp:   18   Temp:   98.9 F (37.2 C)   TempSrc:   Oral   SpO2: 92% 92% 96% (!) 85%  Weight:      Height:        General: Pt is alert, awake, not in acute distress Cardiovascular: RRR, S1/S2 +, no rubs, no gallops Respiratory: fair air movement bilaterally. Slight wheeze bilaterally. Normal respiratory effort Abdominal: Soft, NT, ND, bowel sounds + Extremities: no edema, no cyanosis    The results of significant diagnostics from this hospitalization  (including imaging, microbiology, ancillary and laboratory) are listed below for reference.     Microbiology: No results found for this or any previous visit (from the past 240 hour(s)).   Labs: BNP (last 3 results) No results for input(s): BNP in the last 8760 hours. Basic Metabolic Panel:  Recent Labs Lab 08/12/17 1343 08/13/17 0713  NA 135 133*  K 5.0 3.9  CL 96* 99*  CO2 27 25  GLUCOSE 103* 162*  BUN 20 24*  CREATININE 1.76* 1.22  CALCIUM 9.6 8.6*   Liver Function Tests:  Recent Labs Lab 08/12/17 1343  AST 28  ALT 22  ALKPHOS 67  BILITOT 1.0  PROT 7.9  ALBUMIN 4.7   No results for input(s): LIPASE,  AMYLASE in the last 168 hours. No results for input(s): AMMONIA in the last 168 hours. CBC:  Recent Labs Lab 08/12/17 1343 08/13/17 0713  WBC 8.5 7.2  NEUTROABS 3.8  --   HGB 18.1* 15.9  HCT 54.6* 47.3  MCV 86.5 84.6  PLT 302 281   Cardiac Enzymes: No results for input(s): CKTOTAL, CKMB, CKMBINDEX, TROPONINI in the last 168 hours. BNP: Invalid input(s): POCBNP CBG: No results for input(s): GLUCAP in the last 168 hours. D-Dimer No results for input(s): DDIMER in the last 72 hours. Hgb A1c No results for input(s): HGBA1C in the last 72 hours. Lipid Profile No results for input(s): CHOL, HDL, LDLCALC, TRIG, CHOLHDL, LDLDIRECT in the last 72 hours. Thyroid function studies No results for input(s): TSH, T4TOTAL, T3FREE, THYROIDAB in the last 72 hours.  Invalid input(s): FREET3 Anemia work up No results for input(s): VITAMINB12, FOLATE, FERRITIN, TIBC, IRON, RETICCTPCT in the last 72 hours. Urinalysis    Component Value Date/Time   COLORURINE YELLOW 02/21/2016 2242   APPEARANCEUR CLEAR 02/21/2016 2242   LABSPEC 1.010 02/21/2016 2242   PHURINE 5.5 02/21/2016 2242   GLUCOSEU NEGATIVE 02/21/2016 2242   HGBUR NEGATIVE 02/21/2016 2242   BILIRUBINUR NEGATIVE 02/21/2016 2242   KETONESUR NEGATIVE 02/21/2016 2242   PROTEINUR NEGATIVE 02/21/2016 2242    NITRITE NEGATIVE 02/21/2016 2242   LEUKOCYTESUR NEGATIVE 02/21/2016 2242   Sepsis Labs Invalid input(s): PROCALCITONIN,  WBC,  LACTICIDVEN Microbiology No results found for this or any previous visit (from the past 240 hour(s)).   Time coordinating discharge: Over 30 minutes  SIGNED:   Erick Blinks, MD  [facility] 08/13/2017, 4:40 PM Pager   If 7PM-7AM, please contact night-coverage [email or web address] Password [email or web address]

## 2017-08-13 NOTE — Care Management (Signed)
    Durable Medical Equipment        Start     Ordered   08/13/17 1505  For home use only DME oxygen  Once    Question Answer Comment  Mode or (Route) Nasal cannula   Liters per Minute 2   Frequency Continuous (stationary and portable oxygen unit needed)   Oxygen conserving device Yes   Oxygen delivery system Gas      08/13/17 1504     Supplemental oxygen is needed because alternate treatments (duo nebs and IV steroids) have been tried and found to be insufficient.

## 2017-08-13 NOTE — Progress Notes (Signed)
Respiratory Care Note: The patient was 85% at rest on room air.

## 2017-08-15 ENCOUNTER — Telehealth: Payer: Self-pay

## 2017-08-15 NOTE — Telephone Encounter (Signed)
Transition Care Management Follow-up Telephone Call   Date discharged? 08/13/2017  How have you been since you were released from the hospital? Patient states he is doing better but is still short of breath. The SOB is improved after using his nebulizer treatment. He was discharges home on 2L of O2 and states he is doing well so far.    Do you understand why you were in the hospital? yes   Do you understand the discharge instructions? yes   Where were you discharged to? Home   Items Reviewed:  Medications reviewed: yes  Allergies reviewed: yes  Dietary changes reviewed: yes  Referrals reviewed: yes   Functional Questionnaire:   Activities of Daily Living (ADLs):   He states they are independent in the following: ambulation, bathing and hygiene, feeding, continence, grooming, toileting and dressing States they require assistance with the following: none   Any transportation issues/concerns?: no   Any patient concerns? no   Confirmed importance and date/time of follow-up visits scheduled yes  Provider Appointment booked with Dr. Lodema HongSimpson on 08/26/2017 at 9:40 am.   Confirmed with patient if condition begins to worsen call PCP or go to the ER.  Patient was given the office number and encouraged to call back with question or concerns.  : yes

## 2017-08-26 ENCOUNTER — Encounter: Payer: Self-pay | Admitting: Family Medicine

## 2017-08-26 ENCOUNTER — Ambulatory Visit (INDEPENDENT_AMBULATORY_CARE_PROVIDER_SITE_OTHER): Payer: Self-pay | Admitting: Family Medicine

## 2017-08-26 VITALS — BP 138/88 | HR 64 | Temp 98.7°F | Resp 98 | Ht 71.0 in | Wt 143.5 lb

## 2017-08-26 DIAGNOSIS — Z09 Encounter for follow-up examination after completed treatment for conditions other than malignant neoplasm: Secondary | ICD-10-CM

## 2017-08-26 DIAGNOSIS — I10 Essential (primary) hypertension: Secondary | ICD-10-CM

## 2017-08-26 DIAGNOSIS — F17218 Nicotine dependence, cigarettes, with other nicotine-induced disorders: Secondary | ICD-10-CM

## 2017-08-26 NOTE — Patient Instructions (Signed)
Cancel October  Appointment, reschedule for mid March   Call if you need me sooner  Please continue to work on STOPPING smoking and drinking  Please continue to work on disability and or [employer]icare/ medicaid, as soon as you get either , let us know, we take both  Flu shots are avialble in the community at no cost,m from tim to time throughout the season, ask at the health dept and listen out from local Churches to get info on this. You do need a flu vaccine  Hope yopu keep well  MUST always have  All meds for your breathing and you get these through health dept

## 2017-08-28 ENCOUNTER — Encounter: Payer: Self-pay | Admitting: Family Medicine

## 2017-08-28 NOTE — Assessment & Plan Note (Signed)
Hospitalized with acute COPD flare from 8/27 to 08/13/2017, improved and still challenged with access to medication and healthcare, however continuing to apply for help. Maintenance medications to remain the same

## 2017-08-28 NOTE — Progress Notes (Signed)
   [name]     MRN: [id number]      DOB: [birth year]   HPI [name] is here for follow up of recent hospitalization from 8/27 to 8/28 due to COPD flare. Reports breathing is good currently, and he is still having challenges getting his medication , but is attempting to get help trough health dept as he awaits disability or medicare/medicaid  Antihypertensive medication was discontinued due to abnormal renal function which has since corrected, and  Today at the visit his blood pressure is mildly elevated He denies any current alcohol or nicotine use ROS Denies recent fever or chills. Denies sinus pressure, nasal congestion, ear pain or sore throat. Denies chest congestion, productive cough does have mild  wheezing. Denies chest pains, palpitations and leg swelling Denies abdominal pain, nausea, vomiting,diarrhea or constipation.   Denies dysuria, frequency, hesitancy or incontinence. Denies joint pain, swelling and limitation in mobility. Denies headaches, seizures, numbness, or tingling. Denies depression, anxiety or insomnia. Denies skin break down or rash.   PE  BP 138/88 (BP Location: Left Arm, Patient Position: Sitting, Cuff Size: Normal)   Pulse 64   Temp 98.7 F (37.1 C) (Other (Comment))   Resp (!) 98   Ht 5\' 11"  (1.803 m)   Wt 143 lb 8 oz (65.1 kg)   SpO2 (!) 16%   BMI 20.01 kg/m  O2 is 96%  Patient alert and oriented and in no cardiopulmonary distress.  HEENT: No facial asymmetry, EOMI,   oropharynx pink and moist.  Neck supple no JVD, no mass.  Chest: Clear to auscultation bilaterally.Decreased air entry bilaterally CVS: S1, S2 no murmurs, no S3.Regular rate.  ABD: Soft non tender.   Ext: No edema  MS: Adequate ROM spine, shoulders, hips and knees.  Skin: Intact, no ulcerations or rash noted.  Psych: Good eye contact, normal affect. Memory intact not anxious or depressed appearing.  CNS: CN 2-12 intact, power,  normal throughout.no focal  deficits noted.   Assessment & Plan  Hospital discharge follow-up Hospitalized with acute COPD flare from 8/27 to 08/13/2017, improved and still challenged with access to medication and healthcare, however continuing to apply for help. Maintenance medications to remain the same  Essential hypertension Normotensive off of medication  Alcohol addiction (HCC) counsel led re the need to remain alcohol free  Nicotine dependence, cigarettes, with other nicotine-induced disorders Reports that he is no longer smoking , counseled re the need to remain nicotine free

## 2017-08-28 NOTE — Assessment & Plan Note (Signed)
Normotensive off of medication 

## 2017-08-28 NOTE — Assessment & Plan Note (Signed)
counsel led re the need to remain alcohol free

## 2017-08-28 NOTE — Assessment & Plan Note (Signed)
Reports that he is no longer smoking , counseled re the need to remain nicotine free

## 2017-09-30 ENCOUNTER — Ambulatory Visit: Payer: Self-pay | Admitting: Family Medicine

## 2017-10-18 ENCOUNTER — Other Ambulatory Visit: Payer: Self-pay

## 2017-10-18 ENCOUNTER — Observation Stay (HOSPITAL_COMMUNITY)
Admission: EM | Admit: 2017-10-18 | Discharge: 2017-10-19 | Disposition: A | Discharge status: Home / Self Care | Payer: Medicaid Other | Attending: Family Medicine | Admitting: Family Medicine

## 2017-10-18 ENCOUNTER — Emergency Department (HOSPITAL_COMMUNITY): Payer: Medicaid Other

## 2017-10-18 ENCOUNTER — Encounter (HOSPITAL_COMMUNITY): Payer: Self-pay | Admitting: Emergency Medicine

## 2017-10-18 DIAGNOSIS — R0602 Shortness of breath: Secondary | ICD-10-CM | POA: Diagnosis present

## 2017-10-18 DIAGNOSIS — I2510 Atherosclerotic heart disease of native coronary artery without angina pectoris: Secondary | ICD-10-CM | POA: Diagnosis not present

## 2017-10-18 DIAGNOSIS — J45909 Unspecified asthma, uncomplicated: Secondary | ICD-10-CM | POA: Insufficient documentation

## 2017-10-18 DIAGNOSIS — I10 Essential (primary) hypertension: Secondary | ICD-10-CM | POA: Diagnosis present

## 2017-10-18 DIAGNOSIS — J441 Chronic obstructive pulmonary disease with (acute) exacerbation: Principal | ICD-10-CM | POA: Diagnosis present

## 2017-10-18 DIAGNOSIS — F10288 Alcohol dependence with other alcohol-induced disorder: Secondary | ICD-10-CM

## 2017-10-18 DIAGNOSIS — F17210 Nicotine dependence, cigarettes, uncomplicated: Secondary | ICD-10-CM | POA: Diagnosis not present

## 2017-10-18 DIAGNOSIS — F1020 Alcohol dependence, uncomplicated: Secondary | ICD-10-CM | POA: Diagnosis present

## 2017-10-18 LAB — ETHANOL

## 2017-10-18 LAB — URINALYSIS, ROUTINE W REFLEX MICROSCOPIC
BACTERIA UA: NONE SEEN
BILIRUBIN URINE: NEGATIVE
Glucose, UA: NEGATIVE mg/dL
KETONES UR: 5 mg/dL — AB
LEUKOCYTES UA: NEGATIVE
NITRITE: NEGATIVE
PROTEIN: NEGATIVE mg/dL
Specific Gravity, Urine: 1.023 (ref 1.005–1.030)
pH: 5 (ref 5.0–8.0)

## 2017-10-18 LAB — COMPREHENSIVE METABOLIC PANEL
ALT: 38 U/L (ref 17–63)
AST: 27 U/L (ref 15–41)
Albumin: 5 g/dL (ref 3.5–5.0)
Alkaline Phosphatase: 80 U/L (ref 38–126)
Anion gap: 10 (ref 5–15)
BUN: 15 mg/dL (ref 6–20)
CHLORIDE: 98 mmol/L — AB (ref 101–111)
CO2: 32 mmol/L (ref 22–32)
Calcium: 10 mg/dL (ref 8.9–10.3)
Creatinine, Ser: 1.1 mg/dL (ref 0.61–1.24)
Glucose, Bld: 107 mg/dL — ABNORMAL HIGH (ref 65–99)
POTASSIUM: 4.7 mmol/L (ref 3.5–5.1)
SODIUM: 140 mmol/L (ref 135–145)
Total Bilirubin: 0.9 mg/dL (ref 0.3–1.2)
Total Protein: 8.7 g/dL — ABNORMAL HIGH (ref 6.5–8.1)

## 2017-10-18 LAB — CBC WITH DIFFERENTIAL/PLATELET
BASOS ABS: 0.1 10*3/uL (ref 0.0–0.1)
Basophils Relative: 1 %
EOS ABS: 1.4 10*3/uL — AB (ref 0.0–0.7)
EOS PCT: 13 %
HCT: 52.8 % — ABNORMAL HIGH (ref 39.0–52.0)
Hemoglobin: 16.9 g/dL (ref 13.0–17.0)
LYMPHS PCT: 25 %
Lymphs Abs: 2.7 10*3/uL (ref 0.7–4.0)
MCH: 28.9 pg (ref 26.0–34.0)
MCHC: 32 g/dL (ref 30.0–36.0)
MCV: 90.4 fL (ref 78.0–100.0)
MONO ABS: 0.9 10*3/uL (ref 0.1–1.0)
Monocytes Relative: 9 %
Neutro Abs: 5.5 10*3/uL (ref 1.7–7.7)
Neutrophils Relative %: 52 %
PLATELETS: 303 10*3/uL (ref 150–400)
RBC: 5.84 MIL/uL — AB (ref 4.22–5.81)
RDW: 13.1 % (ref 11.5–15.5)
WBC: 10.6 10*3/uL — AB (ref 4.0–10.5)

## 2017-10-18 LAB — BRAIN NATRIURETIC PEPTIDE: B Natriuretic Peptide: 42 pg/mL (ref 0.0–100.0)

## 2017-10-18 LAB — CREATININE, SERUM
CREATININE: 1.38 mg/dL — AB (ref 0.61–1.24)
GFR calc Af Amer: >60 mL/min (ref >60)
GFR, EST NON AFRICAN AMERICAN: 55 mL/min — AB (ref >60)

## 2017-10-18 LAB — TROPONIN I

## 2017-10-18 LAB — PROTIME-INR
INR: 1
PROTHROMBIN TIME: 13.1 s (ref 11.4–15.2)

## 2017-10-18 LAB — LACTIC ACID, PLASMA
LACTIC ACID, VENOUS: 2 mmol/L — AB (ref 0.5–1.9)
LACTIC ACID, VENOUS: 2.2 mmol/L — AB (ref 0.5–1.9)

## 2017-10-18 MED ORDER — ONDANSETRON HCL 4 MG/2ML IJ SOLN: 4.0000 mg | Freq: Four times a day (QID) | INTRAMUSCULAR | Status: DC | PRN

## 2017-10-18 MED ORDER — PREDNISONE 20 MG PO TABS
40.0000 mg | ORAL_TABLET | Freq: Every day | ORAL | Status: DC
  Administered 2017-10-19: 40 mg via ORAL
  Filled 2017-10-18: qty 2

## 2017-10-18 MED ORDER — ENOXAPARIN SODIUM 40 MG/0.4ML ~~LOC~~ SOLN
40.0000 mg | SUBCUTANEOUS | Status: DC
  Administered 2017-10-18: 40 mg via SUBCUTANEOUS
  Filled 2017-10-18: qty 0.4

## 2017-10-18 MED ORDER — ALBUTEROL (5 MG/ML) CONTINUOUS INHALATION SOLN
15.0000 mg | INHALATION_SOLUTION | Freq: Once | RESPIRATORY_TRACT | Status: AC
  Administered 2017-10-18: 15 mg via RESPIRATORY_TRACT
  Filled 2017-10-18: qty 20

## 2017-10-18 MED ORDER — IPRATROPIUM-ALBUTEROL 0.5-2.5 (3) MG/3ML IN SOLN
3.0000 mL | Freq: Four times a day (QID) | RESPIRATORY_TRACT | Status: DC
  Administered 2017-10-18 – 2017-10-19 (×3): 3 mL via RESPIRATORY_TRACT
  Filled 2017-10-18 (×4): qty 3

## 2017-10-18 MED ORDER — IBUPROFEN 600 MG PO TABS
600.0000 mg | ORAL_TABLET | Freq: Four times a day (QID) | ORAL | Status: DC | PRN
  Administered 2017-10-18: 600 mg via ORAL
  Filled 2017-10-18: qty 1

## 2017-10-18 MED ORDER — MOMETASONE FURO-FORMOTEROL FUM 200-5 MCG/ACT IN AERO
2.0000 | INHALATION_SPRAY | Freq: Two times a day (BID) | RESPIRATORY_TRACT | Status: DC
  Administered 2017-10-19: 2 via RESPIRATORY_TRACT
  Filled 2017-10-18: qty 8.8

## 2017-10-18 MED ORDER — AZITHROMYCIN 250 MG PO TABS
250.0000 mg | ORAL_TABLET | Freq: Every day | ORAL | Status: DC
  Administered 2017-10-19: 250 mg via ORAL
  Filled 2017-10-18: qty 1

## 2017-10-18 MED ORDER — DOCUSATE SODIUM 100 MG PO CAPS
100.0000 mg | ORAL_CAPSULE | Freq: Two times a day (BID) | ORAL | Status: DC
  Administered 2017-10-18 – 2017-10-19 (×2): 100 mg via ORAL
  Filled 2017-10-18 (×2): qty 1

## 2017-10-18 MED ORDER — IPRATROPIUM-ALBUTEROL 0.5-2.5 (3) MG/3ML IN SOLN
3.0000 mL | Freq: Once | RESPIRATORY_TRACT | Status: AC
  Administered 2017-10-18: 3 mL via RESPIRATORY_TRACT
  Filled 2017-10-18: qty 3

## 2017-10-18 MED ORDER — ONDANSETRON HCL 4 MG PO TABS: 4.0000 mg | ORAL_TABLET | Freq: Four times a day (QID) | ORAL | Status: DC | PRN

## 2017-10-18 MED ORDER — ACETAMINOPHEN 325 MG PO TABS: 650.0000 mg | ORAL_TABLET | Freq: Four times a day (QID) | ORAL | Status: DC | PRN

## 2017-10-18 MED ORDER — METHYLPREDNISOLONE SODIUM SUCC 125 MG IJ SOLR
125.0000 mg | Freq: Once | INTRAMUSCULAR | Status: AC
  Administered 2017-10-18: 125 mg via INTRAVENOUS
  Filled 2017-10-18: qty 2

## 2017-10-18 MED ORDER — POLYETHYLENE GLYCOL 3350 17 G PO PACK: 17.0000 g | PACK | Freq: Every day | ORAL | Status: DC | PRN

## 2017-10-18 MED ORDER — AZITHROMYCIN 250 MG PO TABS
500.0000 mg | ORAL_TABLET | Freq: Once | ORAL | Status: AC
  Administered 2017-10-18: 500 mg via ORAL
  Filled 2017-10-18: qty 2

## 2017-10-18 MED ORDER — ALBUTEROL SULFATE (2.5 MG/3ML) 0.083% IN NEBU
5.0000 mg | INHALATION_SOLUTION | Freq: Once | RESPIRATORY_TRACT | Status: AC
  Administered 2017-10-18: 5 mg via RESPIRATORY_TRACT
  Filled 2017-10-18: qty 6

## 2017-10-18 MED ORDER — ACETAMINOPHEN 650 MG RE SUPP: 650.0000 mg | Freq: Four times a day (QID) | RECTAL | Status: DC | PRN

## 2017-10-18 NOTE — ED Triage Notes (Signed)
Patient c/o shortness of breath with congested cough since Wednesday. Per patient thick green sputum. Patient reports hx of COPD and asthma. Patient uses inhalers, O2 as needed, and nebulizer. Patient states "tank ran out of oxygen 3 days ago." Patient also states inhaler is empty. Patient last used nebulizer at 3am. Denies any fevers or chest pain.

## 2017-10-18 NOTE — ED Provider Notes (Signed)
Emergency Department Provider Note   I have reviewed the triage vital signs and the nursing notes.   HISTORY  Chief Complaint Shortness of Breath   HPI [name] is a 61 y.o. male with PMH of asthma, COPD with home O2 at night and PRN during the day presents to the emergency department for evaluation worsening shortness of breath and cough over the last 2 days.  Patient describes the cough is productive of green sputum.  He notes also that his oxygen tank ran out 3 days ago and a new and has not been delivered.  He has been using his nebulizers over the last 2 days with some mild relief in symptoms but nothing that lasts.  He denies any chest pain or fevers.  No chills.  No radiation of symptoms.  Shortness of breath is worse with exertion.  No sick contacts.   Past Medical History:  Diagnosis Date  . Alcohol abuse   . Asthma   . COPD (chronic obstructive pulmonary disease) (HCC)   . Coronary atherosclerosis    Based on chest CT 2012  . Essential hypertension, benign   . History of neutropenia    Also lymphocytosis - followed by Dr. Zigmund DanielFormanek  . Nicotine addiction     Patient Active Problem List   Diagnosis Date Noted  . COPD exacerbation (HCC) 08/12/2017  . Hospital discharge follow-up 06/11/2017  . SVT (supraventricular tachycardia) (HCC) 05/19/2017  . GAD (generalized anxiety disorder) 08/01/2016  . Centrilobular emphysema (HCC) 11/01/2015  . Alcohol addiction (HCC) 06/23/2015  . Underweight 03/24/2015  . Vitamin D deficiency 03/24/2015  . Coronary atherosclerosis 03/11/2014  . Nonspecific abnormal electrocardiogram (ECG) (EKG) 02/17/2014  . Depression with anxiety 02/10/2013  . Neutropenia (HCC) 04/07/2012  . Lymphocytosis 11/01/2011  . ALLERGIC RHINITIS CAUSE UNSPECIFIED 01/10/2011  . MALNUTRITION, MILD 11/27/2010  . Nicotine dependence, cigarettes, with other nicotine-induced disorders 03/30/2008  . Essential hypertension 03/30/2008  . COPD 03/30/2008      Past Surgical History:  Procedure Laterality Date  . MULTIPLE TOOTH EXTRACTIONS  June 2012    Current Outpatient Rx  . Order #: [id number] Class: Historical Med  . Order #: [id number] Class: Print  . Order #: [id number] Class: Print  . Order #: [id number] Class: Print  . Order #: [id number] Class: Normal  . Order #: [id number] Class: Normal    Allergies Lisinopril  Family History  Problem Relation Age of Onset  . Diabetes Mother   . Hypertension Mother   . Diabetes Father   . Hypertension Sister     Social History Social History  Substance Use Topics  . Smoking status: Current Every Day Smoker    Packs/day: 1.00    Years: 38.00    Types: Cigarettes  . Smokeless tobacco: Never Used     Comment: quit "about a week ago" - does not think he needs a patch  . Alcohol use 10.8 oz/week    18 Cans of beer per week     Comment: "I slowed down on that too", reports last drank beer about 3 weeks ago    Review of Systems  Constitutional: No fever/chills Eyes: No visual changes. ENT: No sore throat. Cardiovascular: Denies chest pain. Respiratory: Positive shortness of breath and cough.  Gastrointestinal: No abdominal pain.  No nausea, no vomiting.  No diarrhea.  No constipation. Genitourinary: Negative for dysuria. Musculoskeletal: Negative for back pain. Skin: Negative for rash. Neurological: Negative for headaches, focal weakness or numbness.  10-point ROS otherwise negative.  ____________________________________________   PHYSICAL  EXAM:  VITAL SIGNS: ED Triage Vitals  Enc Vitals Group     BP 10/18/17 1008 (!) 166/112     Pulse Rate 10/18/17 1008 (!) 114     Resp 10/18/17 1008 (!) 28     Temp 10/18/17 1008 99.4 F (37.4 C)     Temp src --      SpO2 10/18/17 1008 (!) 84 %     Weight 10/18/17 1007 145 lb (65.8 kg)     Height 10/18/17 1007 5\' 8"  (1.727 m)   Constitutional: Alert and oriented. Appears SOB on arrival but speaking in 3-4 word sentences.  Eyes:  Conjunctivae are normal.  Head: Atraumatic. Nose: No congestion/rhinnorhea. Mouth/Throat: Mucous membranes are moist. Neck: No stridor.  Cardiovascular: Tachycardia. Good peripheral circulation. Grossly normal heart sounds.   Respiratory: Increased respiratory effort.  No retractions. Lungs with inspiratory and expiratory wheezing throughout.  Gastrointestinal: Soft and nontender. No distention.  Musculoskeletal: No lower extremity tenderness nor edema. No gross deformities of extremities. Neurologic:  Normal speech and language. No gross focal neurologic deficits are appreciated.  Skin:  Skin is warm, dry and intact. No rash noted.  ____________________________________________   LABS (all labs ordered are listed, but only abnormal results are displayed)  Labs Reviewed  CBC WITH DIFFERENTIAL/PLATELET - Abnormal; Notable for the following:       Result Value   WBC 10.6 (*)    RBC 5.84 (*)    HCT 52.8 (*)    Eosinophils Absolute 1.4 (*)    All other components within normal limits  COMPREHENSIVE METABOLIC PANEL - Abnormal; Notable for the following:    Chloride 98 (*)    Glucose, Bld 107 (*)    Total Protein 8.7 (*)    All other components within normal limits  LACTIC ACID, PLASMA - Abnormal; Notable for the following:    Lactic Acid, Venous 2.0 (*)    All other components within normal limits  LACTIC ACID, PLASMA - Abnormal; Notable for the following:    Lactic Acid, Venous 2.2 (*)    All other components within normal limits  CULTURE, BLOOD (ROUTINE X 2)  CULTURE, BLOOD (ROUTINE X 2)  PROTIME-INR  URINALYSIS, ROUTINE W REFLEX MICROSCOPIC   ____________________________________________  EKG   EKG Interpretation  Date/Time:  Friday October 18 2017 11:55:49 EDT Ventricular Rate:  102 PR Interval:  172 QRS Duration: 83 QT Interval:  328 QTC Calculation: 428 R Axis:   79 Text Interpretation:  Sinus tachycardia Atrial premature complexes Biatrial enlargement Borderline  T wave abnormalities No STEMI.  Confirmed by Alona Bene [phone]) on 10/18/2017 12:09:29 PM       ____________________________________________  RADIOLOGY  Dg Chest 2 View  Result Date: 10/18/2017 CLINICAL DATA:  61 year old male with productive cough, congestion and shortness of breath for 2 days. EXAM: CHEST  2 VIEW COMPARISON:  Chest radiographs 08/12/2017 and earlier. FINDINGS: Stable somewhat large lung volumes. Normal cardiac size and mediastinal contours. Visualized tracheal air column is within normal limits. No pneumothorax, pulmonary edema, pleural effusion or confluent pulmonary opacity. No acute osseous abnormality identified. Negative visible bowel gas pattern. IMPRESSION: No acute cardiopulmonary abnormality. Electronically Signed   By: Odessa Fleming M.D.   On: 10/18/2017 10:24    ____________________________________________   PROCEDURES  Procedure(s) performed:   Procedures  CRITICAL CARE Performed by: Maia Plan Total critical care time: 45 minutes Critical care time was exclusive of separately billable procedures and treating other patients. Critical care was necessary to treat or  prevent imminent or life-threatening deterioration. Critical care was time spent personally by me on the following activities: development of treatment plan with patient and/or surrogate as well as nursing, discussions with consultants, evaluation of patient's response to treatment, examination of patient, obtaining history from patient or surrogate, ordering and performing treatments and interventions, ordering and review of laboratory studies, ordering and review of radiographic studies, pulse oximetry and re-evaluation of patient's condition.  Alona Bene, MD Emergency Medicine  ____________________________________________   INITIAL IMPRESSION / ASSESSMENT AND PLAN / ED COURSE  Pertinent labs & imaging results that were available during my care of the patient were reviewed by me and  considered in my medical decision making (see chart for details).  Patient presents to the emergency department with COPD exacerbation symptoms.  He is on 2 L oxygen at home as needed but ran out 3 days ago.  Also running low on his albuterol nebulizer.  No fevers.  Patient has tachycardia and hypoxia requiring 4 L nasal cannula.  Plan for additional nebulizer treatments, steroid, and reassess. Some chest tightness but doubt 2/2 to CAD or PE.   12:55 PM Mild improvement with Duoneb. Will transition to CAT at 15 mg/hr and reassess. Patient continues to have both inspiratory and expiratory wheezing. Does not need BiPAP at this time.   01:40 PM Finishing up his CAT. He is feeling better but continues to be SOB with wheezing.   Discussed patient's case with Hospitalist to request admission. Patient and family (if present) updated with plan. Care transferred to Hospitalist service.  I reviewed all nursing notes, vitals, pertinent old records, EKGs, labs, imaging (as available).  ____________________________________________  FINAL CLINICAL IMPRESSION(S) / ED DIAGNOSES  Final diagnoses:  COPD exacerbation (HCC)     MEDICATIONS GIVEN DURING THIS VISIT:  Medications  albuterol (PROVENTIL) (2.5 MG/3ML) 0.083% nebulizer solution 5 mg (5 mg Nebulization Given 10/18/17 1046)  ipratropium-albuterol (DUONEB) 0.5-2.5 (3) MG/3ML nebulizer solution 3 mL (3 mLs Nebulization Given 10/18/17 1100)  methylPREDNISolone sodium succinate (SOLU-MEDROL) 125 mg/2 mL injection 125 mg (125 mg Intravenous Given 10/18/17 1116)  albuterol (PROVENTIL,VENTOLIN) solution continuous neb (15 mg Nebulization Given 10/18/17 1308)     NEW OUTPATIENT MEDICATIONS STARTED DURING THIS VISIT:  None   Note:  This document was prepared using Dragon voice recognition software and may include unintentional dictation errors.  Alona Bene, MD Emergency Medicine    Takita Riecke, Arlyss Repress, MD 10/18/17 1524

## 2017-10-18 NOTE — H&P (Signed)
History and Physical    [name]:[id number] DOB: February 20, 1956 DOA: 10/18/2017  PCP: Kerri Perches, MD  Patient coming from: Home  Chief Complaint: Shortness of breath  HPI: [name] is a 61 y.o. male with medical history significant of alcohol abuse, COPD, CAD, HTN presented to ED with concerns of shortness of breath.  Reports that he ran out of supplemental oxygen 3 days ago (he was placed on oxygen during his last admission in August) and he had been rationing his nebulizer treatments. He ran out of symbicort as well after losing his job.  Has had cough, productive of tan colored sputum.  Denies fevers, chills, chest pain, palpitations, nausea, vomiting.  Felt tight in the chest and like his throat was closing.  Says he has been unable to sleep at home because he cannot lay down flat for about 2 days.  Denies any sick contacts.  History of asbestos exposure in his twenties.  Was previously on 3L of oxygen.  ED Course: Patient found to be hypoxic on room air and placed on supplemental oxygen.  He was given a nebulizer treatment and then a continuous nebulizer treatment.  He was still wheezing after nebulizer treatment and IV steroids.  [facility] asked to admit for further management of likely COPD exacerbation.   Review of Systems: As per HPI otherwise 10 point review of systems negative.    Past Medical History:  Diagnosis Date  . Alcohol abuse   . Asthma   . COPD (chronic obstructive pulmonary disease) (HCC)   . Coronary atherosclerosis    Based on chest CT 2012  . Essential hypertension, benign   . History of neutropenia    Also lymphocytosis - followed by Dr. Zigmund Daniel  . Nicotine addiction     Past Surgical History:  Procedure Laterality Date  . MULTIPLE TOOTH EXTRACTIONS  June 2012     reports that he has been smoking Cigarettes.  He has a 38.00 pack-year smoking history. He has never used smokeless tobacco. He reports that he drinks about 10.8 oz of alcohol  per week . He reports that he does not use drugs. Has not smoked for the last two weeks.  Allergies  Allergen Reactions  . Lisinopril Swelling and Other (See Comments)    Reaction:  Angioedema    Family History  Problem Relation Age of Onset  . Diabetes Mother   . Hypertension Mother   . Diabetes Father   . Hypertension Sister     Prior to Admission medications   Medication Sig Start Date End Date Taking? Authorizing Provider  aspirin EC 81 MG tablet Take 81 mg by mouth daily.   Yes [provider]  busPIRone (BUSPAR) 5 MG tablet Take 1 tablet (5 mg total) by mouth 2 (two) times daily. 04/11/17  Yes Kerri Perches, MD  citalopram (CELEXA) 20 MG tablet Take 1 tablet (20 mg total) by mouth daily. 04/11/17  Yes Kerri Perches, MD  ipratropium-albuterol (DUONEB) 0.5-2.5 (3) MG/3ML SOLN Take 3 mLs by nebulization every 6 (six) hours as needed. Patient taking differently: Take 3 mLs by nebulization every 6 (six) hours as needed (for wheezing/shortness of breath).  04/11/17  Yes Kerri Perches, MD  azithromycin (ZITHROMAX) 250 MG tablet Take 1 tablet (250 mg total) by mouth daily. Patient not taking: Reported on 08/26/2017 08/13/17   Erick Blinks, MD  budesonide-formoterol [facility]) 160-4.5 MCG/ACT inhaler Inhale 2 puffs into the lungs 2 (two) times daily. Patient not taking:  Reported on 08/26/2017 08/13/17   Erick Blinks, MD    Physical Exam: Vitals:   10/18/17 1645 10/18/17 1650 10/18/17 1657 10/18/17 1737  BP:  (!) 146/86 (!) 146/86 (!) 157/109  Pulse: 98 (!) 104    Resp: 20 18  (!) 22  Temp:    99.2 F (37.3 C)  TempSrc:    Oral  SpO2: 99% 100%  100%  Weight:    63.7 kg (140 lb 6.9 oz)  Height:    5\' 8"  (1.727 m)      Constitutional: mild distress Vitals:   10/18/17 1645 10/18/17 1650 10/18/17 1657 10/18/17 1737  BP:  (!) 146/86 (!) 146/86 (!) 157/109  Pulse: 98 (!) 104    Resp: 20 18  (!) 22  Temp:    99.2 F (37.3 C)  TempSrc:    Oral    SpO2: 99% 100%  100%  Weight:    63.7 kg (140 lb 6.9 oz)  Height:    5\' 8"  (1.727 m)   Eyes: PERRL, lids and conjunctivae normal ENMT: Mucous membranes are moist. Posterior pharynx clear of any exudate or lesions.Normal dentition.  Neck: normal, supple, no masses, no thyromegaly Respiratory: accessory muscle use, increased work of breathing, diffuse expiratory wheezing throughout Cardiovascular: Regular rate and rhythm, no murmurs / rubs / gallops. No extremity edema. 2+ pedal pulses. No carotid bruits.  Abdomen: no tenderness, no masses palpated. No hepatosplenomegaly. Bowel sounds positive.  Musculoskeletal: no clubbing / cyanosis. No joint deformity upper and lower extremities. Good ROM, no contractures. Normal muscle tone.  Skin: no rashes, lesions, ulcers. No induration Neurologic: CN 2-12 grossly intact. Sensation intact, DTR normal. Strength 5/5 in all 4.  Psychiatric:  Alert and oriented x 3. Normal mood.    Labs on Admission: I have personally reviewed following labs and imaging studies  CBC:  Recent Labs Lab 10/18/17 1058  WBC 10.6*  NEUTROABS 5.5  HGB 16.9  HCT 52.8*  MCV 90.4  PLT 303   Basic Metabolic Panel:  Recent Labs Lab 10/18/17 1058  NA 140  K 4.7  CL 98*  CO2 32  GLUCOSE 107*  BUN 15  CREATININE 1.10  CALCIUM 10.0   GFR: Estimated Creatinine Clearance: 66 mL/min (by C-G formula based on SCr of 1.1 mg/dL). Liver Function Tests:  Recent Labs Lab 10/18/17 1058  AST 27  ALT 38  ALKPHOS 80  BILITOT 0.9  PROT 8.7*  ALBUMIN 5.0   No results for input(s): LIPASE, AMYLASE in the last 168 hours. No results for input(s): AMMONIA in the last 168 hours. Coagulation Profile:  Recent Labs Lab 10/18/17 1058  INR 1.00   Cardiac Enzymes: No results for input(s): CKTOTAL, CKMB, CKMBINDEX, TROPONINI in the last 168 hours. BNP (last 3 results) No results for input(s): PROBNP in the last 8760 hours. HbA1C: No results for input(s): HGBA1C in the  last 72 hours. CBG: No results for input(s): GLUCAP in the last 168 hours. Lipid Profile: No results for input(s): CHOL, HDL, LDLCALC, TRIG, CHOLHDL, LDLDIRECT in the last 72 hours. Thyroid Function Tests: No results for input(s): TSH, T4TOTAL, FREET4, T3FREE, THYROIDAB in the last 72 hours. Anemia Panel: No results for input(s): VITAMINB12, FOLATE, FERRITIN, TIBC, IRON, RETICCTPCT in the last 72 hours. Urine analysis:    Component Value Date/Time   COLORURINE YELLOW 10/18/2017 1013   APPEARANCEUR CLEAR 10/18/2017 1013   LABSPEC 1.023 10/18/2017 1013   PHURINE 5.0 10/18/2017 1013   GLUCOSEU NEGATIVE 10/18/2017 1013   HGBUR  SMALL (A) 10/18/2017 1013   BILIRUBINUR NEGATIVE 10/18/2017 1013   KETONESUR 5 (A) 10/18/2017 1013   PROTEINUR NEGATIVE 10/18/2017 1013   NITRITE NEGATIVE 10/18/2017 1013   LEUKOCYTESUR NEGATIVE 10/18/2017 1013   Sepsis Labs: !!!!!!!!!!!!!!!!!!!!!!!!!!!!!!!!!!!!!!!!!!!! @LABRCNTIP (procalcitonin:4,lacticidven:4) ) Recent Results (from the past 240 hour(s))  Culture, blood (Routine x 2)     Status: None (Preliminary result)   Collection Time: 10/18/17 10:58 AM  Result Value Ref Range Status   Specimen Description LEFT ANTECUBITAL  Final   Special Requests   Final    BOTTLES DRAWN AEROBIC AND ANAEROBIC Blood Culture results may not be optimal due to an inadequate volume of blood received in culture bottles   Culture PENDING  Incomplete   Report Status PENDING  Incomplete  Culture, blood (Routine x 2)     Status: None (Preliminary result)   Collection Time: 10/18/17 10:58 AM  Result Value Ref Range Status   Specimen Description BLOOD LEFT HAND  Final   Special Requests   Final    BOTTLES DRAWN AEROBIC ONLY Blood Culture adequate volume   Culture PENDING  Incomplete   Report Status PENDING  Incomplete     Radiological Exams on Admission: Dg Chest 2 View  Result Date: 10/18/2017 CLINICAL DATA:  61 year old male with productive cough, congestion and  shortness of breath for 2 days. EXAM: CHEST  2 VIEW COMPARISON:  Chest radiographs 08/12/2017 and earlier. FINDINGS: Stable somewhat large lung volumes. Normal cardiac size and mediastinal contours. Visualized tracheal air column is within normal limits. No pneumothorax, pulmonary edema, pleural effusion or confluent pulmonary opacity. No acute osseous abnormality identified. Negative visible bowel gas pattern. IMPRESSION: No acute cardiopulmonary abnormality. Electronically Signed   By: Odessa FlemingH  Hall M.D.   On: 10/18/2017 10:24    EKG: Independently reviewed. Sinus tachy with possible t wave changes  Assessment/Plan Principal Problem:   COPD with acute exacerbation (HCC) Active Problems:   Essential hypertension   Coronary atherosclerosis   Alcohol addiction (HCC)   COPD exacerbation (HCC)    COPD exacerbation - will give continue PO steroids - azithromycin - duonebs - dulera - supplemental oxygen - COPD orderset used - CM consult for assistance in getting oxygen and medications/ inhalers - smoking cessation  Essential HTN - does not appear as though patient is on any medication for HTN - start HCTZ - monitor BP - may need to add CCB  CAD - aspirin - BP control - statin  Alcohol abuse - monitor for signs of withdrawal - will order CIWA if beginning to withdrawal - alcohol level  Tobacco abuse - patient states he quite     DVT prophylaxis: lovenox Code Status: full Family Communication: none present  Disposition Plan: home when improved Consults called: none Admission status: obs, tele   Katrinka BlazingAlex U Kadolph MD [facility] Pager [phone]  If 7PM-7AM, please contact night-coverage [email or web address] Password [email or web address]  10/18/2017, 5:42 PM

## 2017-10-19 LAB — BASIC METABOLIC PANEL
Anion gap: 10 (ref 5–15)
BUN: 24 mg/dL — ABNORMAL HIGH (ref 6–20)
CHLORIDE: 101 mmol/L (ref 101–111)
CO2: 25 mmol/L (ref 22–32)
Calcium: 9 mg/dL (ref 8.9–10.3)
Creatinine, Ser: 1.01 mg/dL (ref 0.61–1.24)
GFR calc non Af Amer: >60 mL/min (ref >60)
Glucose, Bld: 111 mg/dL — ABNORMAL HIGH (ref 65–99)
POTASSIUM: 3.9 mmol/L (ref 3.5–5.1)
SODIUM: 136 mmol/L (ref 135–145)

## 2017-10-19 LAB — TROPONIN I
Troponin I: <0.03 ng/mL (ref <0.03)
Troponin I: <0.03 ng/mL (ref <0.03)

## 2017-10-19 MED ORDER — AZITHROMYCIN 250 MG PO TABS: 250.0000 mg | ORAL_TABLET | Freq: Every day | ORAL | 0 refills | Status: AC

## 2017-10-19 MED ORDER — BUDESONIDE-FORMOTEROL FUMARATE 160-4.5 MCG/ACT IN AERO: 2.0000 | INHALATION_SPRAY | Freq: Two times a day (BID) | RESPIRATORY_TRACT | 0 refills | Status: DC

## 2017-10-19 MED ORDER — IPRATROPIUM-ALBUTEROL 0.5-2.5 (3) MG/3ML IN SOLN: 3.0000 mL | Freq: Four times a day (QID) | RESPIRATORY_TRACT | 0 refills | Status: DC | PRN

## 2017-10-19 MED ORDER — PREDNISONE 20 MG PO TABS: 40.0000 mg | ORAL_TABLET | Freq: Every day | ORAL | 0 refills | Status: AC

## 2017-10-19 MED ORDER — ALBUTEROL SULFATE HFA 108 (90 BASE) MCG/ACT IN AERS: 2.0000 | INHALATION_SPRAY | Freq: Four times a day (QID) | RESPIRATORY_TRACT | 0 refills | Status: DC | PRN

## 2017-10-19 NOTE — Progress Notes (Signed)
Patient discharged home.  IV removed - WNL.  Reviewed AVS and medications.  Emphasized importance of taking meds as prescribed.  Instructed to call Monday AM to schedule follow up with PCP.  Verbalizes understanding.  No questions at this time.  Patient in NAD - waiting for ride to arrive to take him home.

## 2017-10-19 NOTE — Plan of Care (Signed)
Problem: Activity: Goal: Risk for activity intolerance will decrease Outcome: Completed/Met Date Met: 10/19/17 Patient able to ambulate on RA and tolerate well

## 2017-10-19 NOTE — Care Management Note (Addendum)
Case Management Note  Patient Details  Name: [name] MRN: [id number] Date of Birth: 09/12/1956  Subjective/Objective:CM received call from provider re: [facility] and [facility] as pt to be discharged with Spiriva, prednisone and Duonebs. CM contacted Jermaine, with [facility] who will call pt to [facility]. CM will call to explain MATCH. Pt is familiar with MATCH as he received assistance in June of this year. Already has [employer].  No questions about MATCH will fax to the [employer] on [address]. Pt tells me he is in the process of getting disability. Aware he needs to take his Rxs and 3.00 per Rx to Pharmacy to get his meds.                    Action/Plan: CM will sign off for now but will be available should additional discharge needs arise or disposition change.    Expected Discharge Date:                  Expected Discharge Plan:  Home/Self Care  In-House Referral:  NA  Discharge planning Services  CM Consult, MATCH Program, Other - See comment [facility](Charity care for Nebulizer)  Post Acute Care Choice:  Durable Medical Equipment Choice offered to:  Patient  DME Arranged:  Nebulizer/meds, Nebulizer machine DME Agency:  [facility]  HH Arranged:    [facility] Agency:     Status of Service:  In process, will continue to follow  If discussed at Long Length of Stay Meetings, dates discussed:    Additional Comments:  Yvone NeuCrutchfield, Paula Busenbark M, RN 10/19/2017, 12:59 PM

## 2017-10-19 NOTE — Progress Notes (Signed)
Patients O2 sats on RA at rest 92%. While ambulating on RA sats maintained 90-92%.  Patient reports that SOB has improved - only mild at this time.  Pt does complain of feeling dizzy though.  Able to remain steady and appeared to tolerate well

## 2017-10-19 NOTE — Discharge Summary (Signed)
Physician Discharge Summary  [name] WUJ:[id number] DOB: 15-May-1956 DOA: 10/18/2017  PCP: Kerri Perches, MD  Admit date: 10/18/2017 Discharge date: 10/19/2017  Admitted From: Home  Disposition:  Home  Recommendations for Outpatient Follow-up:  1. Follow up with PCP in 1-2 weeks 2. Please obtain BMP/CBC in one week 3. Please follow up on the following pending results:  Home Health: No  Equipment/Devices: None   Discharge Condition: Stable CODE STATUS: Full Code  Diet recommendation: Heart Healthy  Brief/Interim Summary: [name] is a 61 y.o. male with medical history significant of alcohol abuse, COPD, CAD, HTN presented to ED with concerns of shortness of breath.  Reports that he ran out of supplemental oxygen 3 days ago (he was placed on oxygen during his last admission in August) and he had been rationing his nebulizer treatments. He ran out of symbicort as well after losing his job.  Has had cough, productive of tan colored sputum.  Denies fevers, chills, chest pain, palpitations, nausea, vomiting.  Felt tight in the chest and like his throat was closing.  Says he has been unable to sleep at home because he cannot lay down flat for about 2 days.  Denies any sick contacts.  History of asbestos exposure in his twenties.  Was previously on 3L of oxygen.  ED Course: Patient found to be hypoxic on room air and placed on supplemental oxygen.  He was given a nebulizer treatment and then a continuous nebulizer treatment.  He was still wheezing after nebulizer treatment and IV steroids.  TRH asked to admit for further management of likely COPD exacerbation.   Patient improved with steroids, antibiotics and breathing treatments.  He was ambulated and his oxygen saturations were found to be 90% or greater.  Case management assisted in having MATCH voucher for patient so he could get his medications at time of discharge.  He was instructed to pick up his medications and use as  directed and follow up with Dr. Lodema Hong outpatient.  Discharge Diagnoses:  Principal Problem:   COPD with acute exacerbation (HCC) Active Problems:   Essential hypertension   Coronary atherosclerosis   Alcohol addiction (HCC)   COPD exacerbation [facility])    Discharge Instructions  Discharge Instructions    Call MD for:  difficulty breathing, headache or visual disturbances    Complete by:  As directed    Call MD for:  extreme fatigue    Complete by:  As directed    Call MD for:  hives    Complete by:  As directed    Call MD for:  persistant dizziness or light-headedness    Complete by:  As directed    Call MD for:  persistant nausea and vomiting    Complete by:  As directed    Call MD for:  redness, tenderness, or signs of infection (pain, swelling, redness, odor or green/yellow discharge around incision site)    Complete by:  As directed    Call MD for:  severe uncontrolled pain    Complete by:  As directed    Call MD for:  temperature >100.4    Complete by:  As directed    Diet - low sodium heart healthy    Complete by:  As directed    Discharge instructions    Complete by:  As directed    Get prescriptions filled Use medications as instructed Follow up with Dr. Lodema Hong   Increase activity slowly    Complete by:  As  directed      Allergies as of 10/19/2017      Reactions   Lisinopril Swelling, Other (See Comments)   Reaction:  Angioedema      Medication List    TAKE these medications   albuterol 108 (90 Base) MCG/ACT inhaler Commonly known as:  PROVENTIL HFA;VENTOLIN HFA Inhale 2 puffs into the lungs every 6 (six) hours as needed for wheezing or shortness of breath.   aspirin EC 81 MG tablet Take 81 mg by mouth daily.   azithromycin 250 MG tablet Commonly known as:  ZITHROMAX Take 1 tablet (250 mg total) by mouth daily.   budesonide-formoterol 160-4.5 MCG/ACT inhaler Commonly known as:  SYMBICORT Inhale 2 puffs into the lungs 2 (two) times daily.    busPIRone 5 MG tablet Commonly known as:  BUSPAR Take 1 tablet (5 mg total) by mouth 2 (two) times daily.   citalopram 20 MG tablet Commonly known as:  CELEXA Take 1 tablet (20 mg total) by mouth daily.   ipratropium-albuterol 0.5-2.5 (3) MG/3ML Soln Commonly known as:  DUONEB Take 3 mLs by nebulization every 6 (six) hours as needed (for wheezing/shortness of breath).   predniSONE 20 MG tablet Commonly known as:  DELTASONE Take 2 tablets (40 mg total) by mouth daily with breakfast.       Allergies  Allergen Reactions  . Lisinopril Swelling and Other (See Comments)    Reaction:  Angioedema    Consultations:  CM   Procedures/Studies: Dg Chest 2 View  Result Date: 10/18/2017 CLINICAL DATA:  61 year old male with productive cough, congestion and shortness of breath for 2 days. EXAM: CHEST  2 VIEW COMPARISON:  Chest radiographs 08/12/2017 and earlier. FINDINGS: Stable somewhat large lung volumes. Normal cardiac size and mediastinal contours. Visualized tracheal air column is within normal limits. No pneumothorax, pulmonary edema, pleural effusion or confluent pulmonary opacity. No acute osseous abnormality identified. Negative visible bowel gas pattern. IMPRESSION: No acute cardiopulmonary abnormality. Electronically Signed   By: Odessa FlemingH  Hall M.D.   On: 10/18/2017 10:24       Subjective: Patient feeling well this morning.  Reports that his breathing feels markedly better after breathing treatments and steroids.  Discharge Exam: Vitals:   10/19/17 1256 10/19/17 1356  BP: (!) 135/99   Pulse: 75   Resp: 18   Temp: 97.8 F (36.6 C)   SpO2: 94% 93%   Vitals:   10/19/17 0540 10/19/17 0934 10/19/17 1256 10/19/17 1356  BP: 123/83  (!) 135/99   Pulse: 71  75   Resp: 18  18   Temp: 98.3 F (36.8 C)  97.8 F (36.6 C)   TempSrc: Oral  Oral   SpO2: 99% 95% 94% 93%  Weight: 63.4 kg (139 lb 12.8 oz)     Height:        General: Pt is alert, awake, not in acute  distress Cardiovascular: RRR, S1/S2 +, no rubs, no gallops Respiratory: expiratory wheezes bilaterally, good aeration, no increased respiratory effort Abdominal: Soft, NT, ND, bowel sounds + Extremities: no edema, no cyanosis    The results of significant diagnostics from this hospitalization (including imaging, microbiology, ancillary and laboratory) are listed below for reference.     Microbiology: Recent Results (from the past 240 hour(s))  Culture, blood (Routine x 2)     Status: None (Preliminary result)   Collection Time: 10/18/17 10:58 AM  Result Value Ref Range Status   Specimen Description LEFT ANTECUBITAL  Final   Special Requests   Final  BOTTLES DRAWN AEROBIC AND ANAEROBIC Blood Culture results may not be optimal due to an inadequate volume of blood received in culture bottles   Culture NO GROWTH < 24 HOURS  Final   Report Status PENDING  Incomplete  Culture, blood (Routine x 2)     Status: None (Preliminary result)   Collection Time: 10/18/17 10:58 AM  Result Value Ref Range Status   Specimen Description BLOOD LEFT HAND  Final   Special Requests   Final    BOTTLES DRAWN AEROBIC ONLY Blood Culture adequate volume   Culture NO GROWTH < 24 HOURS  Final   Report Status PENDING  Incomplete     Labs: BNP (last 3 results)  Recent Labs  10/18/17 1851  BNP 42.0   Basic Metabolic Panel:  Recent Labs Lab 10/18/17 1058 10/18/17 1851 10/19/17 0640  NA 140  --  136  K 4.7  --  3.9  CL 98*  --  101  CO2 32  --  25  GLUCOSE 107*  --  111*  BUN 15  --  24*  CREATININE 1.10 1.38* 1.01  CALCIUM 10.0  --  9.0   Liver Function Tests:  Recent Labs Lab 10/18/17 1058  AST 27  ALT 38  ALKPHOS 80  BILITOT 0.9  PROT 8.7*  ALBUMIN 5.0   No results for input(s): LIPASE, AMYLASE in the last 168 hours. No results for input(s): AMMONIA in the last 168 hours. CBC:  Recent Labs Lab 10/18/17 1058  WBC 10.6*  NEUTROABS 5.5  HGB 16.9  HCT 52.8*  MCV 90.4  PLT  303   Cardiac Enzymes:  Recent Labs Lab 10/18/17 1851 10/18/17 2351 10/19/17 0640  TROPONINI <0.03 <0.03 <0.03   BNP: Invalid input(s): POCBNP CBG: No results for input(s): GLUCAP in the last 168 hours. D-Dimer No results for input(s): DDIMER in the last 72 hours. Hgb A1c No results for input(s): HGBA1C in the last 72 hours. Lipid Profile No results for input(s): CHOL, HDL, LDLCALC, TRIG, CHOLHDL, LDLDIRECT in the last 72 hours. Thyroid function studies No results for input(s): TSH, T4TOTAL, T3FREE, THYROIDAB in the last 72 hours.  Invalid input(s): FREET3 Anemia work up No results for input(s): VITAMINB12, FOLATE, FERRITIN, TIBC, IRON, RETICCTPCT in the last 72 hours. Urinalysis    Component Value Date/Time   COLORURINE YELLOW 10/18/2017 1013   APPEARANCEUR CLEAR 10/18/2017 1013   LABSPEC 1.023 10/18/2017 1013   PHURINE 5.0 10/18/2017 1013   GLUCOSEU NEGATIVE 10/18/2017 1013   HGBUR SMALL (A) 10/18/2017 1013   BILIRUBINUR NEGATIVE 10/18/2017 1013   KETONESUR 5 (A) 10/18/2017 1013   PROTEINUR NEGATIVE 10/18/2017 1013   NITRITE NEGATIVE 10/18/2017 1013   LEUKOCYTESUR NEGATIVE 10/18/2017 1013   Sepsis Labs Invalid input(s): PROCALCITONIN,  WBC,  LACTICIDVEN Microbiology Recent Results (from the past 240 hour(s))  Culture, blood (Routine x 2)     Status: None (Preliminary result)   Collection Time: 10/18/17 10:58 AM  Result Value Ref Range Status   Specimen Description LEFT ANTECUBITAL  Final   Special Requests   Final    BOTTLES DRAWN AEROBIC AND ANAEROBIC Blood Culture results may not be optimal due to an inadequate volume of blood received in culture bottles   Culture NO GROWTH < 24 HOURS  Final   Report Status PENDING  Incomplete  Culture, blood (Routine x 2)     Status: None (Preliminary result)   Collection Time: 10/18/17 10:58 AM  Result Value Ref Range Status   Specimen Description  BLOOD LEFT HAND  Final   Special Requests   Final    BOTTLES DRAWN  AEROBIC ONLY Blood Culture adequate volume   Culture NO GROWTH < 24 HOURS  Final   Report Status PENDING  Incomplete     Time coordinating discharge: 35 minutes  SIGNED:   [name], MD  [facility] 10/19/2017, 3:12 PM Pager [phone] If 7PM-7AM, please contact night-coverage [email or web address] Password [email or web address]

## 2017-10-19 NOTE — Evaluation (Signed)
Physical Therapy Evaluation Patient Details Name: [name] MRN: [id number] DOB: [birth year] Today's Date: 10/19/2017   History of Present Illness  [name] is a 61 y.o. male with medical history significant of alcohol abuse, COPD, CAD, HTN presented to ED with concerns of shortness of breath.  Reports that he ran out of supplemental oxygen 3 days ago (he was placed on oxygen during his last admission in August) and he had been rationing his nebulizer treatments. He ran out of symbicort as well after losing his job.  Has had cough, productive of tan colored sputum.  Denies fevers, chills, chest pain, palpitations, nausea, vomiting.  Felt tight in the chest and like his throat was closing.  Says he has been unable to sleep at home because he cannot lay down flat for about 2 days.  Denies any sick contacts.  History of asbestos exposure in his twenties.  Was previously on 3L of oxygen.    Clinical Impression  Patient functioning at baseline for mobility and gait.  Patient discharged to care of nursing staff for ambulation ad lib while in hospital for length of stay.    Follow Up Recommendations No PT follow up    Equipment Recommendations  None recommended by PT    Recommendations for Other Services       Precautions / Restrictions Precautions Precautions: None Restrictions Weight Bearing Restrictions: No      Mobility  Bed Mobility Overal bed mobility: Independent                Transfers Overall transfer level: Independent                  Ambulation/Gait Ambulation/Gait assistance: Independent Ambulation Distance (Feet): 150 Feet Assistive device: None Gait Pattern/deviations: WFL(Within Functional Limits)   Gait velocity interpretation: at or above normal speed for age/gender    Stairs Stairs: Yes Stairs assistance: Modified independent (Device/Increase time)   Number of Stairs: 27 General stair comments: went up stairs without using  siderail, used siderail on left side going down, no loss of balance and good return demonstrated  Wheelchair Mobility    Modified Rankin (Stroke Patients Only)       Balance Overall balance assessment: Independent                                           Pertinent Vitals/Pain Pain Assessment: No/denies pain    Home Living Family/patient expects to be discharged to:: Private residence Living Arrangements: Parent;Other relatives Available Help at Discharge: Family Type of Home: House Home Access: Stairs to enter Entrance Stairs-Rails: Left Entrance Stairs-Number of Steps: 8-9 steps with 1 siderail left side going up at car port entry Home Layout: Two level Home Equipment: Cane - single point;Walker - 2 wheels;Shower seat;Bedside commode;Wheelchair - manual      Prior Function Level of Independence: Independent               Hand Dominance        Extremity/Trunk Assessment   Upper Extremity Assessment Upper Extremity Assessment: Overall WFL for tasks assessed    Lower Extremity Assessment Lower Extremity Assessment: Overall WFL for tasks assessed    Cervical / Trunk Assessment Cervical / Trunk Assessment: Normal  Communication   Communication: No difficulties  Cognition Arousal/Alertness: Awake/alert Behavior During Therapy: WFL for tasks assessed/performed Overall Cognitive Status: Within Functional Limits for tasks  assessed                                        General Comments      Exercises     Assessment/Plan    PT Assessment Patent does not need any further PT services  PT Problem List         PT Treatment Interventions      PT Goals (Current goals can be found in the Care Plan section)  Acute Rehab PT Goals Patient Stated Goal: return home Time For Goal Achievement: 10/19/17 Potential to Achieve Goals: Good    Frequency     Barriers to discharge        Co-evaluation                AM-PAC PT "6 Clicks" Daily Activity  Outcome Measure Difficulty turning over in bed (including adjusting bedclothes, sheets and blankets)?: None Difficulty moving from lying on back to sitting on the side of the bed? : None Difficulty sitting down on and standing up from a chair with arms (e.g., wheelchair, bedside commode, etc,.)?: None Help needed moving to and from a bed to chair (including a wheelchair)?: None Help needed walking in hospital room?: None Help needed climbing 3-5 steps with a railing? : None 6 Click Score: 24    End of Session   Activity Tolerance: Patient tolerated treatment well Patient left: in chair;with call bell/phone within reach Nurse Communication: Mobility status PT Visit Diagnosis: Unsteadiness on feet (R26.81);Other abnormalities of gait and mobility (R26.89);Muscle weakness (generalized) (M62.81)    Time: [id number] PT Time Calculation (min) (ACUTE ONLY): 25 min   Charges:   PT Evaluation $PT Eval Low Complexity: 1 Low PT Treatments $Gait Training: 8-22 mins   PT G Codes:   PT G-Codes **NOT FOR INPATIENT CLASS** Functional Assessment Tool Used: AM-PAC 6 Clicks Basic Mobility Functional Limitation: Mobility: Walking and moving around Mobility: Walking and Moving Around Current Status ([id number](G8978): 0 percent impaired, limited or restricted Mobility: Walking and Moving Around Goal Status (F6213(G8979): 0 percent impaired, limited or restricted Mobility: Walking and Moving Around Discharge Status [phone](G8980): 0 percent impaired, limited or restricted    8:57 AM, 10/19/17 Ocie BobJames Nilsa Macht, MPT Physical Therapist [facility] 336 [phone] office [phone] mobile phone

## 2017-10-23 LAB — CULTURE, BLOOD (ROUTINE X 2)
CULTURE: NO GROWTH
Culture: NO GROWTH
Special Requests: ADEQUATE

## 2017-10-31 ENCOUNTER — Ambulatory Visit (INDEPENDENT_AMBULATORY_CARE_PROVIDER_SITE_OTHER): Payer: Self-pay | Admitting: Family Medicine

## 2017-10-31 ENCOUNTER — Encounter: Payer: Self-pay | Admitting: Family Medicine

## 2017-10-31 VITALS — BP 132/90 | HR 88 | Resp 16 | Ht 68.0 in | Wt 152.0 lb

## 2017-10-31 DIAGNOSIS — I10 Essential (primary) hypertension: Secondary | ICD-10-CM

## 2017-10-31 DIAGNOSIS — F10288 Alcohol dependence with other alcohol-induced disorder: Secondary | ICD-10-CM

## 2017-10-31 DIAGNOSIS — Z09 Encounter for follow-up examination after completed treatment for conditions other than malignant neoplasm: Secondary | ICD-10-CM

## 2017-10-31 MED ORDER — PREDNISONE 10 MG (21) PO TBPK: ORAL_TABLET | ORAL | 0 refills | Status: DC

## 2017-10-31 NOTE — Patient Instructions (Signed)
F/u as before , call if you need me sooner  Prednisone is sent in for an additional 6 days, starting with 6 tablets daily down to one daily  Continue to remain nicotine free, and alcohol free  All the best with diability, you should get this

## 2017-11-25 NOTE — Progress Notes (Signed)
   [name]     MRN: [id number]      DOB: 1956-03-23   HPI [name] is here for follow up of hospitalization from 11/2 to 10/19/2017 for COPD flare. He reports that his breathing is much improved. He however is asking for an additional burst of prednisone t help with his breathing, he denies any current  fever, chills or drainage or sputum requests  Still working on getting health care coverage ROS  Denies chest pains, palpitations and leg swelling Denies abdominal pain, nausea, vomiting,diarrhea or constipation.   Denies dysuria, frequency, hesitancy or incontinence. Denies joint pain, swelling and limitation in mobility. Denies headaches, seizures, numbness, or tingling. Denies depression, anxiety or insomnia. Denies skin break down or rash.   PE  BP 132/90   Pulse 88   Resp 16   Ht 5\' 8"  (1.727 m)   Wt 152 lb (68.9 kg)   SpO2 93%   BMI 23.11 kg/m   Patient alert and oriented and in no cardiopulmonary distress.  HEENT: No facial asymmetry, EOMI,   oropharynx pink and moist.  Neck supple no JVD, no mass.  Chest: Adequate though reduced air enry, scattered high pitched wheezes, no crackles.  CVS: S1, S2 no murmurs, no S3.Regular rate.  ABD: Soft non tender.   Ext: No edema  MS: Adequate ROM spine, shoulders, hips and knees.  Skin: Intact, no ulcerations or rash noted.  Psych: Good eye contact, normal affect. Memory intact not anxious or depressed appearing.  CNS: CN 2-12 intact, power,  normal throughout.no focal deficits noted.   Assessment & Plan  Hospital discharge follow-up Hospitalized overnight for COPD flare from 11/2 to 10/19/2017, improved , but still has some wheezing and continues to be challenged in terms of access to healthcare. Prednisone dose pack prescribed for an additional steroid burst based on today'sexam  Essential hypertension Elevated diastolic pressure,  needs to reduce sodium intake and quit smoking entirely  Alcohol  addiction (HCC) Needs to quit alcohol entirely to improve health , encouraged to become a part of a rehab program once more, no interest at this time, does not feel he needs this, states he has cut way back on his own

## 2017-11-25 NOTE — Assessment & Plan Note (Signed)
Hospitalized overnight for COPD flare from 11/2 to 10/19/2017, improved , but still has some wheezing and continues to be challenged in terms of access to healthcare. Prednisone dose pack prescribed for an additional steroid burst based on today'sexam

## 2017-11-25 NOTE — Assessment & Plan Note (Addendum)
Needs to quit alcohol entirely to improve health , encouraged to become a part of a rehab program once more, no interest at this time, does not feel he needs this, states he has cut way back on his own

## 2017-11-25 NOTE — Assessment & Plan Note (Signed)
Elevated diastolic pressure,  needs to reduce sodium intake and quit smoking entirely

## 2017-12-20 ENCOUNTER — Emergency Department (HOSPITAL_COMMUNITY): Payer: Medicaid Other

## 2017-12-20 ENCOUNTER — Other Ambulatory Visit: Payer: Self-pay

## 2017-12-20 ENCOUNTER — Encounter (HOSPITAL_COMMUNITY): Payer: Self-pay | Admitting: *Deleted

## 2017-12-20 ENCOUNTER — Emergency Department (HOSPITAL_COMMUNITY)
Admission: EM | Admit: 2017-12-20 | Discharge: 2017-12-20 | Disposition: A | Discharge status: Home / Self Care | Payer: Medicaid Other | Attending: Emergency Medicine | Admitting: Emergency Medicine

## 2017-12-20 DIAGNOSIS — J441 Chronic obstructive pulmonary disease with (acute) exacerbation: Secondary | ICD-10-CM | POA: Insufficient documentation

## 2017-12-20 DIAGNOSIS — Z79899 Other long term (current) drug therapy: Secondary | ICD-10-CM | POA: Insufficient documentation

## 2017-12-20 DIAGNOSIS — R062 Wheezing: Secondary | ICD-10-CM | POA: Diagnosis not present

## 2017-12-20 DIAGNOSIS — Z87891 Personal history of nicotine dependence: Secondary | ICD-10-CM | POA: Insufficient documentation

## 2017-12-20 DIAGNOSIS — Z7982 Long term (current) use of aspirin: Secondary | ICD-10-CM | POA: Diagnosis not present

## 2017-12-20 DIAGNOSIS — I10 Essential (primary) hypertension: Secondary | ICD-10-CM | POA: Diagnosis not present

## 2017-12-20 DIAGNOSIS — Z9981 Dependence on supplemental oxygen: Secondary | ICD-10-CM | POA: Insufficient documentation

## 2017-12-20 DIAGNOSIS — R0602 Shortness of breath: Secondary | ICD-10-CM | POA: Diagnosis present

## 2017-12-20 DIAGNOSIS — R05 Cough: Secondary | ICD-10-CM | POA: Diagnosis not present

## 2017-12-20 LAB — BASIC METABOLIC PANEL
ANION GAP: 12 (ref 5–15)
BUN: 15 mg/dL (ref 6–20)
CHLORIDE: 97 mmol/L — AB (ref 101–111)
CO2: 27 mmol/L (ref 22–32)
Calcium: 9.5 mg/dL (ref 8.9–10.3)
Creatinine, Ser: 1.09 mg/dL (ref 0.61–1.24)
GFR calc non Af Amer: >60 mL/min (ref >60)
Glucose, Bld: 116 mg/dL — ABNORMAL HIGH (ref 65–99)
Potassium: 4.4 mmol/L (ref 3.5–5.1)
SODIUM: 136 mmol/L (ref 135–145)

## 2017-12-20 LAB — CBC
HCT: 54.6 % — ABNORMAL HIGH (ref 39.0–52.0)
HEMOGLOBIN: 17.7 g/dL — AB (ref 13.0–17.0)
MCH: 28.9 pg (ref 26.0–34.0)
MCHC: 32.4 g/dL (ref 30.0–36.0)
MCV: 89.2 fL (ref 78.0–100.0)
Platelets: 284 10*3/uL (ref 150–400)
RBC: 6.12 MIL/uL — AB (ref 4.22–5.81)
RDW: 12.5 % (ref 11.5–15.5)
WBC: 7 10*3/uL (ref 4.0–10.5)

## 2017-12-20 MED ORDER — ALBUTEROL SULFATE (2.5 MG/3ML) 0.083% IN NEBU
5.0000 mg | INHALATION_SOLUTION | Freq: Once | RESPIRATORY_TRACT | Status: AC
  Administered 2017-12-20: 5 mg via RESPIRATORY_TRACT
  Filled 2017-12-20: qty 6

## 2017-12-20 MED ORDER — ALBUTEROL SULFATE (2.5 MG/3ML) 0.083% IN NEBU: 2.5000 mg | INHALATION_SOLUTION | Freq: Four times a day (QID) | RESPIRATORY_TRACT | 12 refills | Status: DC | PRN

## 2017-12-20 MED ORDER — METHYLPREDNISOLONE SODIUM SUCC 125 MG IJ SOLR
125.0000 mg | Freq: Once | INTRAMUSCULAR | Status: AC
  Administered 2017-12-20: 125 mg via INTRAVENOUS
  Filled 2017-12-20: qty 2

## 2017-12-20 MED ORDER — ALBUTEROL (5 MG/ML) CONTINUOUS INHALATION SOLN
10.0000 mg/h | INHALATION_SOLUTION | Freq: Once | RESPIRATORY_TRACT | Status: AC
  Administered 2017-12-20: 10 mg/h via RESPIRATORY_TRACT
  Filled 2017-12-20: qty 20

## 2017-12-20 MED ORDER — ALBUTEROL SULFATE HFA 108 (90 BASE) MCG/ACT IN AERS
2.0000 | INHALATION_SPRAY | Freq: Once | RESPIRATORY_TRACT | Status: AC
  Administered 2017-12-20: 2 via RESPIRATORY_TRACT
  Filled 2017-12-20: qty 6.7

## 2017-12-20 MED ORDER — ALBUTEROL SULFATE (2.5 MG/3ML) 0.083% IN NEBU
2.5000 mg | INHALATION_SOLUTION | Freq: Once | RESPIRATORY_TRACT | Status: AC
  Administered 2017-12-20: 2.5 mg via RESPIRATORY_TRACT
  Filled 2017-12-20: qty 3

## 2017-12-20 MED ORDER — IPRATROPIUM-ALBUTEROL 0.5-2.5 (3) MG/3ML IN SOLN
3.0000 mL | Freq: Once | RESPIRATORY_TRACT | Status: AC
  Administered 2017-12-20: 3 mL via RESPIRATORY_TRACT
  Filled 2017-12-20: qty 3

## 2017-12-20 MED ORDER — PREDNISONE 50 MG PO TABS: ORAL_TABLET | ORAL | 0 refills | Status: DC

## 2017-12-20 NOTE — ED Triage Notes (Signed)
Pt reports SOB, hx of COPD, reports out of his inhaler

## 2017-12-20 NOTE — ED Notes (Signed)
Pt states he feels better and is breathing better after the breathing treatment

## 2017-12-20 NOTE — ED Provider Notes (Signed)
[facility][name] EMERGENCY DEPARTMENT Provider Note   CSN: [id number] Arrival date & time: 12/20/17  0104     History   Chief Complaint Chief Complaint  Patient presents with  . Shortness of Breath   Level 5 caveat due to acuity of condition HPI [name] is a 62 y.o. male.  The history is provided by the patient. The history is limited by the condition of the patient.  Shortness of Breath  This is a new problem. The average episode lasts 1 day. The problem occurs frequently.The problem has been rapidly worsening. Associated symptoms include sputum production and wheezing. Pertinent negatives include no hemoptysis and no leg swelling. Associated medical issues include COPD.  Patient with history of COPD, on home oxygen as needed, presents with worsening shortness of breath over the past day.  He reports cough as well as wheezing and shortness of breath that is not improving at home.  No other details are known at this time due to acuity of condition  Past Medical History:  Diagnosis Date  . Alcohol abuse   . Asthma   . COPD (chronic obstructive pulmonary disease) (HCC)   . Coronary atherosclerosis    Based on chest CT 2012  . Essential hypertension, benign   . History of neutropenia    Also lymphocytosis - followed by Dr. Zigmund DanielFormanek  . Nicotine addiction     Patient Active Problem List   Diagnosis Date Noted  . COPD exacerbation (HCC) 08/12/2017  . Hospital discharge follow-up 06/11/2017  . SVT (supraventricular tachycardia) (HCC) 05/19/2017  . GAD (generalized anxiety disorder) 08/01/2016  . Centrilobular emphysema (HCC) 11/01/2015  . Alcohol addiction (HCC) 06/23/2015  . Underweight 03/24/2015  . Vitamin D deficiency 03/24/2015  . Coronary atherosclerosis 03/11/2014  . Nonspecific abnormal electrocardiogram (ECG) (EKG) 02/17/2014  . Depression with anxiety 02/10/2013  . Neutropenia (HCC) 04/07/2012  . Lymphocytosis 11/01/2011  . ALLERGIC RHINITIS CAUSE UNSPECIFIED  01/10/2011  . MALNUTRITION, MILD 11/27/2010  . Nicotine dependence, cigarettes, with other nicotine-induced disorders 03/30/2008  . Essential hypertension 03/30/2008  . COPD with acute exacerbation (HCC) 03/30/2008    Past Surgical History:  Procedure Laterality Date  . MULTIPLE TOOTH EXTRACTIONS  June 2012       Home Medications    Prior to Admission medications   Medication Sig Start Date End Date Taking? Authorizing Provider  albuterol (PROVENTIL HFA;VENTOLIN HFA) 108 (90 Base) MCG/ACT inhaler Inhale 2 puffs into the lungs every 6 (six) hours as needed for wheezing or shortness of breath. 10/19/17   Filbert SchilderKadolph, Alexandria U, MD  aspirin EC 81 MG tablet Take 81 mg by mouth daily.    [provider]  budesonide-formoterol (SYMBICORT) 160-4.5 MCG/ACT inhaler Inhale 2 puffs into the lungs 2 (two) times daily. Patient not taking: Reported on 10/31/2017 10/19/17   Filbert SchilderKadolph, Alexandria U, MD  busPIRone (BUSPAR) 5 MG tablet Take 1 tablet (5 mg total) by mouth 2 (two) times daily. 04/11/17   Kerri PerchesSimpson, Margaret E, MD  citalopram (CELEXA) 20 MG tablet Take 1 tablet (20 mg total) by mouth daily. 04/11/17   Kerri PerchesSimpson, Margaret E, MD  ipratropium-albuterol (DUONEB) 0.5-2.5 (3) MG/3ML SOLN Take 3 mLs by nebulization every 6 (six) hours as needed (for wheezing/shortness of breath). 10/19/17 10/24/17  Filbert SchilderKadolph, Alexandria U, MD  mometasone-formoterol [facility](DULERA) 200-5 MCG/ACT AERO Inhale 2 puffs 2 (two) times daily into the lungs.    [provider]  predniSONE (STERAPRED UNI-PAK 21 TAB) 10 MG (21) TBPK tablet Use as direced 10/31/17  Kerri Perches, MD    Family History Family History  Problem Relation Age of Onset  . Diabetes Mother   . Hypertension Mother   . Diabetes Father   . Hypertension Sister     Social History Social History   Tobacco Use  . Smoking status: Current Every Day Smoker    Packs/day: 1.00    Years: 38.00    Pack years: 38.00    Types: Cigarettes  .  Smokeless tobacco: Never Used  . Tobacco comment: quit "about a week ago" - does not think he needs a patch  Substance Use Topics  . Alcohol use: Yes    Alcohol/week: 10.8 oz    Types: 18 Cans of beer per week    Comment: "I slowed down on that too", reports last drank beer about 3 weeks ago  . Drug use: No     Allergies   Lisinopril   Review of Systems Review of Systems  Unable to perform ROS: Acuity of condition  Respiratory: Positive for sputum production, shortness of breath and wheezing. Negative for hemoptysis.   Cardiovascular: Negative for leg swelling.     Physical Exam Updated Vital Signs BP (!) 139/93   Pulse (!) 103   Temp 98.6 F (37 C)   Resp (!) 25   SpO2 94%   Physical Exam CONSTITUTIONAL: Well developed/well nourished, distress noted HEAD: Normocephalic/atraumatic EYES: EOMI ENMT: Mucous membranes moist NECK: supple no meningeal signs SPINE/BACK:entire spine nontender CV: S1/S2 noted, no murmurs/rubs/gallops noted LUNGS: Wheezing bilaterally, tachypnea noted ABDOMEN: soft, nontender, no rebound or guarding, bowel sounds noted throughout abdomen GU:no cva tenderness NEURO: Pt is awake/alert/appropriate, moves all extremitiesx4.  No facial droop.   EXTREMITIES: pulses normal/equal, full ROM, no calf tenderness or edema SKIN: warm, color normal PSYCH: no abnormalities of mood noted, alert and oriented to situation   ED Treatments / Results  Labs (all labs ordered are listed, but only abnormal results are displayed) Labs Reviewed  BASIC METABOLIC PANEL - Abnormal; Notable for the following components:      Result Value   Chloride 97 (*)    Glucose, Bld 116 (*)    All other components within normal limits  CBC - Abnormal; Notable for the following components:   RBC 6.12 (*)    Hemoglobin 17.7 (*)    HCT 54.6 (*)    All other components within normal limits    EKG ED ECG REPORT   Date: 12/20/2017 0129  Rate: 99  Rhythm: sinus  tachycardia  QRS Axis: normal  Intervals: normal  ST/T Wave abnormalities: nonspecific ST changes  Conduction Disutrbances:none  Narrative Interpretation:   Old EKG Reviewed: unchanged Artifact noted I have personally reviewed the EKG tracing and agree with the computerized printout as noted.  Radiology Dg Chest Portable 1 View  Result Date: 12/20/2017 CLINICAL DATA:  Shortness of breath and cough. History of asthma, COPD, hypertension, current smoker. EXAM: PORTABLE CHEST 1 VIEW COMPARISON:  10/18/2017 FINDINGS: The heart size and mediastinal contours are within normal limits. Both lungs are clear. The visualized skeletal structures are unremarkable. IMPRESSION: No active disease. Electronically Signed   By: Burman Nieves M.D.   On: 12/20/2017 02:18    Procedures Procedures  CRITICAL CARE Performed by: Joya Gaskins Total critical care time: 30 minutes Critical care time was exclusive of separately billable procedures and treating other patients. Critical care was necessary to treat or prevent imminent or life-threatening deterioration. Critical care was time spent personally by me  on the following activities: development of treatment plan with patient and/or surrogate as well as nursing, discussions with consultants, evaluation of patient's response to treatment, examination of patient, obtaining history from patient or surrogate, ordering and performing treatments and interventions, ordering and review of laboratory studies, ordering and review of radiographic studies, pulse oximetry and re-evaluation of patient's condition.   Medications Ordered in ED Medications  ipratropium-albuterol (DUONEB) 0.5-2.5 (3) MG/3ML nebulizer solution 3 mL (3 mLs Nebulization Given 12/20/17 0131)  albuterol (PROVENTIL) (2.5 MG/3ML) 0.083% nebulizer solution 2.5 mg (2.5 mg Nebulization Given 12/20/17 0131)  methylPREDNISolone sodium succinate (SOLU-MEDROL) 125 mg/2 mL injection 125 mg (125 mg  Intravenous Given 12/20/17 0216)  albuterol (PROVENTIL,VENTOLIN) solution continuous neb (10 mg/hr Nebulization Given 12/20/17 0225)  albuterol (PROVENTIL) (2.5 MG/3ML) 0.083% nebulizer solution 5 mg (5 mg Nebulization Given 12/20/17 0445)  albuterol (PROVENTIL HFA;VENTOLIN HFA) 108 (90 Base) MCG/ACT inhaler 2 puff (2 puffs Inhalation Given 12/20/17 0636)  albuterol (PROVENTIL) (2.5 MG/3ML) 0.083% nebulizer solution 5 mg (5 mg Nebulization Given 12/20/17 9604)     Initial Impression / Assessment and Plan / ED Course  I have reviewed the triage vital signs and the nursing notes.  Pertinent labs & imaging results that were available during my care of the patient were reviewed by me and considered in my medical decision making (see chart for details).     2:49 AM Continue wheezing, will give an hour-long nebulized treatment 7:04 AM  Patient monitored for several hours After multiple treatments, patient reports he feels improved He is still wheezing, but work of breathing is improved He would like to go home I did offer admission to the patient, but he prefers to go home Advised him to quit smoking He will use albuterol every 2-4 hours the next few days We will also start prednisone Told him that if he feels worse at all in the next 1-2 days he must call 911 to return Final Clinical Impressions(s) / ED Diagnoses   Final diagnoses:  COPD exacerbation [facility])    ED Discharge Orders        Ordered    predniSONE (DELTASONE) 50 MG tablet     12/20/17 0559    albuterol (PROVENTIL) (2.5 MG/3ML) 0.083% nebulizer solution  Every 6 hours PRN     12/20/17 0604       Zadie Rhine, MD 12/20/17 [phone]

## 2017-12-20 NOTE — ED Notes (Addendum)
Pt ambulatory around nurses station with steady gait. On room air, o2 sats before ambulation at 93%, during ambulation at 90%, upon re-entry to exam room at 85%. Placed pt back on 2L [place]. MD aware. Pt c/o SOB after ambulation.

## 2017-12-31 ENCOUNTER — Encounter (HOSPITAL_COMMUNITY): Payer: Self-pay | Admitting: Emergency Medicine

## 2017-12-31 ENCOUNTER — Other Ambulatory Visit: Payer: Self-pay

## 2017-12-31 ENCOUNTER — Emergency Department (HOSPITAL_COMMUNITY): Payer: Medicaid Other

## 2017-12-31 ENCOUNTER — Inpatient Hospital Stay (HOSPITAL_COMMUNITY)
Admission: EM | Admit: 2017-12-31 | Discharge: 2018-01-01 | DRG: 192 | Disposition: A | Discharge status: Home / Self Care | Payer: Medicaid Other | Attending: Internal Medicine | Admitting: Internal Medicine

## 2017-12-31 DIAGNOSIS — F1010 Alcohol abuse, uncomplicated: Secondary | ICD-10-CM | POA: Diagnosis present

## 2017-12-31 DIAGNOSIS — Z7951 Long term (current) use of inhaled steroids: Secondary | ICD-10-CM

## 2017-12-31 DIAGNOSIS — Z9981 Dependence on supplemental oxygen: Secondary | ICD-10-CM | POA: Diagnosis not present

## 2017-12-31 DIAGNOSIS — I10 Essential (primary) hypertension: Secondary | ICD-10-CM | POA: Diagnosis present

## 2017-12-31 DIAGNOSIS — J309 Allergic rhinitis, unspecified: Secondary | ICD-10-CM | POA: Diagnosis present

## 2017-12-31 DIAGNOSIS — Z888 Allergy status to other drugs, medicaments and biological substances status: Secondary | ICD-10-CM

## 2017-12-31 DIAGNOSIS — R0902 Hypoxemia: Secondary | ICD-10-CM | POA: Diagnosis present

## 2017-12-31 DIAGNOSIS — F418 Other specified anxiety disorders: Secondary | ICD-10-CM | POA: Diagnosis present

## 2017-12-31 DIAGNOSIS — Z79899 Other long term (current) drug therapy: Secondary | ICD-10-CM

## 2017-12-31 DIAGNOSIS — F17210 Nicotine dependence, cigarettes, uncomplicated: Secondary | ICD-10-CM | POA: Diagnosis present

## 2017-12-31 DIAGNOSIS — Z7982 Long term (current) use of aspirin: Secondary | ICD-10-CM

## 2017-12-31 DIAGNOSIS — I2510 Atherosclerotic heart disease of native coronary artery without angina pectoris: Secondary | ICD-10-CM | POA: Diagnosis present

## 2017-12-31 DIAGNOSIS — J441 Chronic obstructive pulmonary disease with (acute) exacerbation: Principal | ICD-10-CM | POA: Diagnosis present

## 2017-12-31 DIAGNOSIS — R0603 Acute respiratory distress: Secondary | ICD-10-CM | POA: Diagnosis present

## 2017-12-31 DIAGNOSIS — Z8249 Family history of ischemic heart disease and other diseases of the circulatory system: Secondary | ICD-10-CM

## 2017-12-31 LAB — CBC WITH DIFFERENTIAL/PLATELET
BASOS ABS: 0.1 10*3/uL (ref 0.0–0.1)
BASOS PCT: 1 %
EOS PCT: 19 %
Eosinophils Absolute: 1.8 10*3/uL — ABNORMAL HIGH (ref 0.0–0.7)
HEMATOCRIT: 54.2 % — AB (ref 39.0–52.0)
Hemoglobin: 17.1 g/dL — ABNORMAL HIGH (ref 13.0–17.0)
Lymphocytes Relative: 27 %
Lymphs Abs: 2.5 10*3/uL (ref 0.7–4.0)
MCH: 28.5 pg (ref 26.0–34.0)
MCHC: 31.5 g/dL (ref 30.0–36.0)
MCV: 90.3 fL (ref 78.0–100.0)
MONOS PCT: 13 %
Monocytes Absolute: 1.2 10*3/uL (ref 0.1–1.0)
NEUTROS ABS: 3.8 10*3/uL (ref 1.7–7.7)
Neutrophils Relative %: 40 %
PLATELETS: 280 10*3/uL (ref 150–400)
RBC: 6 MIL/uL — ABNORMAL HIGH (ref 4.22–5.81)
RDW: 12.4 % (ref 11.5–15.5)
WBC: 9.4 10*3/uL (ref 4.0–10.5)

## 2017-12-31 LAB — COMPREHENSIVE METABOLIC PANEL
ALBUMIN: 4.4 g/dL (ref 3.5–5.0)
ALT: 28 U/L (ref 17–63)
AST: 25 U/L (ref 15–41)
Alkaline Phosphatase: 69 U/L (ref 38–126)
Anion gap: 9 (ref 5–15)
BILIRUBIN TOTAL: 0.5 mg/dL (ref 0.3–1.2)
BUN: 19 mg/dL (ref 6–20)
CHLORIDE: 96 mmol/L — AB (ref 101–111)
CO2: 31 mmol/L (ref 22–32)
Calcium: 9.4 mg/dL (ref 8.9–10.3)
Creatinine, Ser: 1.17 mg/dL (ref 0.61–1.24)
GFR calc Af Amer: >60 mL/min (ref >60)
Glucose, Bld: 120 mg/dL — ABNORMAL HIGH (ref 65–99)
POTASSIUM: 4.6 mmol/L (ref 3.5–5.1)
Sodium: 136 mmol/L (ref 135–145)
TOTAL PROTEIN: 8.1 g/dL (ref 6.5–8.1)

## 2017-12-31 LAB — BRAIN NATRIURETIC PEPTIDE: B Natriuretic Peptide: 17 pg/mL (ref 0.0–100.0)

## 2017-12-31 LAB — TROPONIN I

## 2017-12-31 MED ORDER — AZITHROMYCIN 250 MG PO TABS
500.0000 mg | ORAL_TABLET | Freq: Every day | ORAL | Status: AC
  Administered 2017-12-31: 500 mg via ORAL
  Filled 2017-12-31: qty 2

## 2017-12-31 MED ORDER — METHYLPREDNISOLONE SODIUM SUCC 125 MG IJ SOLR
60.0000 mg | Freq: Four times a day (QID) | INTRAMUSCULAR | Status: DC
  Administered 2017-12-31 – 2018-01-01 (×5): 60 mg via INTRAVENOUS
  Filled 2017-12-31 (×4): qty 2

## 2017-12-31 MED ORDER — HYDRALAZINE HCL 20 MG/ML IJ SOLN
5.0000 mg | Freq: Once | INTRAMUSCULAR | Status: AC
  Administered 2017-12-31: 5 mg via INTRAVENOUS
  Filled 2017-12-31: qty 1

## 2017-12-31 MED ORDER — METHYLPREDNISOLONE SODIUM SUCC 125 MG IJ SOLR
125.0000 mg | Freq: Once | INTRAMUSCULAR | Status: AC
  Administered 2017-12-31: 125 mg via INTRAVENOUS
  Filled 2017-12-31: qty 2

## 2017-12-31 MED ORDER — ALBUTEROL SULFATE (2.5 MG/3ML) 0.083% IN NEBU: 2.5000 mg | INHALATION_SOLUTION | Freq: Four times a day (QID) | RESPIRATORY_TRACT | Status: DC | PRN

## 2017-12-31 MED ORDER — ASPIRIN EC 81 MG PO TBEC
81.0000 mg | DELAYED_RELEASE_TABLET | Freq: Every day | ORAL | Status: DC
  Administered 2017-12-31 – 2018-01-01 (×2): 81 mg via ORAL
  Filled 2017-12-31 (×2): qty 1

## 2017-12-31 MED ORDER — SODIUM CHLORIDE 0.9% FLUSH
3.0000 mL | Freq: Two times a day (BID) | INTRAVENOUS | Status: DC
  Administered 2017-12-31 – 2018-01-01 (×2): 3 mL via INTRAVENOUS

## 2017-12-31 MED ORDER — OXYCODONE-ACETAMINOPHEN 5-325 MG PO TABS
1.0000 | ORAL_TABLET | Freq: Once | ORAL | Status: AC
  Administered 2017-12-31: 1 via ORAL
  Filled 2017-12-31: qty 1

## 2017-12-31 MED ORDER — BUSPIRONE HCL 5 MG PO TABS
5.0000 mg | ORAL_TABLET | Freq: Two times a day (BID) | ORAL | Status: DC
  Administered 2017-12-31 – 2018-01-01 (×2): 5 mg via ORAL
  Filled 2017-12-31 (×2): qty 1

## 2017-12-31 MED ORDER — CITALOPRAM HYDROBROMIDE 20 MG PO TABS
20.0000 mg | ORAL_TABLET | Freq: Every day | ORAL | Status: DC
  Administered 2017-12-31 – 2018-01-01 (×2): 20 mg via ORAL
  Filled 2017-12-31 (×2): qty 1

## 2017-12-31 MED ORDER — ALBUTEROL SULFATE (2.5 MG/3ML) 0.083% IN NEBU
2.5000 mg | INHALATION_SOLUTION | Freq: Once | RESPIRATORY_TRACT | Status: AC
  Administered 2017-12-31: 2.5 mg via RESPIRATORY_TRACT
  Filled 2017-12-31: qty 3

## 2017-12-31 MED ORDER — SODIUM CHLORIDE 0.9 % IV SOLN: 250.0000 mL | INTRAVENOUS | Status: DC | PRN

## 2017-12-31 MED ORDER — ONDANSETRON HCL 4 MG PO TABS: 4.0000 mg | ORAL_TABLET | Freq: Four times a day (QID) | ORAL | Status: DC | PRN

## 2017-12-31 MED ORDER — ONDANSETRON HCL 4 MG/2ML IJ SOLN: 4.0000 mg | Freq: Four times a day (QID) | INTRAMUSCULAR | Status: DC | PRN

## 2017-12-31 MED ORDER — IPRATROPIUM-ALBUTEROL 0.5-2.5 (3) MG/3ML IN SOLN
3.0000 mL | Freq: Once | RESPIRATORY_TRACT | Status: AC
  Administered 2017-12-31: 3 mL via RESPIRATORY_TRACT
  Filled 2017-12-31: qty 3

## 2017-12-31 MED ORDER — SODIUM CHLORIDE 0.9 % IV BOLUS (SEPSIS)
1000.0000 mL | Freq: Once | INTRAVENOUS | Status: AC
  Administered 2017-12-31: 1000 mL via INTRAVENOUS

## 2017-12-31 MED ORDER — GUAIFENESIN ER 600 MG PO TB12
1200.0000 mg | ORAL_TABLET | Freq: Two times a day (BID) | ORAL | Status: DC
  Administered 2017-12-31 – 2018-01-01 (×3): 1200 mg via ORAL
  Filled 2017-12-31 (×3): qty 2

## 2017-12-31 MED ORDER — ACETAMINOPHEN 650 MG RE SUPP: 650.0000 mg | Freq: Four times a day (QID) | RECTAL | Status: DC | PRN

## 2017-12-31 MED ORDER — ACETAMINOPHEN 325 MG PO TABS: 650.0000 mg | ORAL_TABLET | Freq: Four times a day (QID) | ORAL | Status: DC | PRN

## 2017-12-31 MED ORDER — AZITHROMYCIN 250 MG PO TABS
250.0000 mg | ORAL_TABLET | Freq: Every day | ORAL | Status: DC
  Administered 2018-01-01: 250 mg via ORAL
  Filled 2017-12-31: qty 1

## 2017-12-31 MED ORDER — ENOXAPARIN SODIUM 40 MG/0.4ML ~~LOC~~ SOLN
40.0000 mg | SUBCUTANEOUS | Status: DC
  Administered 2017-12-31: 40 mg via SUBCUTANEOUS
  Filled 2017-12-31: qty 0.4

## 2017-12-31 MED ORDER — IPRATROPIUM-ALBUTEROL 0.5-2.5 (3) MG/3ML IN SOLN
3.0000 mL | Freq: Four times a day (QID) | RESPIRATORY_TRACT | Status: DC
  Administered 2017-12-31 – 2018-01-01 (×4): 3 mL via RESPIRATORY_TRACT
  Filled 2017-12-31 (×5): qty 3

## 2017-12-31 MED ORDER — MAGNESIUM SULFATE 2 GM/50ML IV SOLN
2.0000 g | Freq: Once | INTRAVENOUS | Status: AC
  Administered 2017-12-31: 2 g via INTRAVENOUS
  Filled 2017-12-31: qty 50

## 2017-12-31 MED ORDER — MOMETASONE FURO-FORMOTEROL FUM 200-5 MCG/ACT IN AERO
2.0000 | INHALATION_SPRAY | Freq: Two times a day (BID) | RESPIRATORY_TRACT | Status: DC
  Administered 2017-12-31 – 2018-01-01 (×2): 2 via RESPIRATORY_TRACT
  Filled 2017-12-31: qty 8.8

## 2017-12-31 MED ORDER — ALBUTEROL SULFATE (2.5 MG/3ML) 0.083% IN NEBU
INHALATION_SOLUTION | RESPIRATORY_TRACT | Status: AC
  Administered 2017-12-31: 2.5 mg via RESPIRATORY_TRACT
  Filled 2017-12-31: qty 3

## 2017-12-31 MED ORDER — AMLODIPINE BESYLATE 5 MG PO TABS
5.0000 mg | ORAL_TABLET | Freq: Every day | ORAL | Status: DC
  Administered 2017-12-31 – 2018-01-01 (×2): 5 mg via ORAL
  Filled 2017-12-31 (×2): qty 1

## 2017-12-31 MED ORDER — HYDRALAZINE HCL 20 MG/ML IJ SOLN: 10.0000 mg | Freq: Four times a day (QID) | INTRAMUSCULAR | Status: DC | PRN

## 2017-12-31 MED ORDER — SODIUM CHLORIDE 0.9% FLUSH: 3.0000 mL | INTRAVENOUS | Status: DC | PRN

## 2017-12-31 NOTE — ED Provider Notes (Signed)
[facility] EMERGENCY DEPARTMENT Provider Note   CSN: [id number] Arrival date & time: 12/31/17  4098     History   Chief Complaint Chief Complaint  Patient presents with  . Shortness of Breath    HPI [name] is a 62 y.o. male.    Patient complains of shortness of breath and cough for a few days he had yellow green sputum production   The history is provided by the patient.  Shortness of Breath  This is a recurrent problem. The problem occurs continuously.The current episode started 6 to 12 hours ago. The problem has not changed since onset.Associated symptoms include wheezing. Pertinent negatives include no fever, no headaches, no cough, no chest pain, no abdominal pain and no rash. He has tried nothing for the symptoms. The treatment provided no relief. He has had prior hospitalizations. He has had prior ED visits. He has had no prior ICU admissions. Associated medical issues include COPD.    Past Medical History:  Diagnosis Date  . Alcohol abuse   . Asthma   . COPD (chronic obstructive pulmonary disease) (HCC)   . Coronary atherosclerosis    Based on chest CT 2012  . Essential hypertension, benign   . History of neutropenia    Also lymphocytosis - followed by Dr. Zigmund Daniel  . Nicotine addiction     Patient Active Problem List   Diagnosis Date Noted  . COPD exacerbation (HCC) 08/12/2017  . Hospital discharge follow-up 06/11/2017  . SVT (supraventricular tachycardia) (HCC) 05/19/2017  . GAD (generalized anxiety disorder) 08/01/2016  . Centrilobular emphysema (HCC) 11/01/2015  . Alcohol addiction (HCC) 06/23/2015  . Underweight 03/24/2015  . Vitamin D deficiency 03/24/2015  . Coronary atherosclerosis 03/11/2014  . Nonspecific abnormal electrocardiogram (ECG) (EKG) 02/17/2014  . Depression with anxiety 02/10/2013  . Neutropenia (HCC) 04/07/2012  . Lymphocytosis 11/01/2011  . ALLERGIC RHINITIS CAUSE UNSPECIFIED 01/10/2011  . MALNUTRITION, MILD 11/27/2010    . Nicotine dependence, cigarettes, with other nicotine-induced disorders 03/30/2008  . Essential hypertension 03/30/2008  . COPD with acute exacerbation (HCC) 03/30/2008    Past Surgical History:  Procedure Laterality Date  . MULTIPLE TOOTH EXTRACTIONS  June 2012       Home Medications    Prior to Admission medications   Medication Sig Start Date End Date Taking? Authorizing Provider  albuterol (PROVENTIL) (2.5 MG/3ML) 0.083% nebulizer solution Take 3 mLs (2.5 mg total) by nebulization every 6 (six) hours as needed for wheezing or shortness of breath. 12/20/17  Yes Zadie Rhine, MD  aspirin EC 81 MG tablet Take 81 mg by mouth daily.   Yes [provider]  busPIRone (BUSPAR) 5 MG tablet Take 1 tablet (5 mg total) by mouth 2 (two) times daily. 04/11/17  Yes Kerri Perches, MD  citalopram (CELEXA) 20 MG tablet Take 1 tablet (20 mg total) by mouth daily. 04/11/17  Yes Kerri Perches, MD  mometasone-formoterol (DULERA) 200-5 MCG/ACT AERO Inhale 2 puffs 2 (two) times daily into the lungs.   Yes [provider]  budesonide-formoterol (SYMBICORT) 160-4.5 MCG/ACT inhaler Inhale 2 puffs into the lungs 2 (two) times daily. Patient not taking: Reported on 10/31/2017 10/19/17   Filbert Schilder, MD  ipratropium-albuterol (DUONEB) 0.5-2.5 (3) MG/3ML SOLN Take 3 mLs by nebulization every 6 (six) hours as needed (for wheezing/shortness of breath). 10/19/17 10/24/17  Filbert Schilder, MD    Family History Family History  Problem Relation Age of Onset  . Diabetes Mother   . Hypertension Mother   .  Diabetes Father   . Hypertension Sister     Social History Social History   Tobacco Use  . Smoking status: Current Every Day Smoker    Packs/day: 1.00    Years: 38.00    Pack years: 38.00    Types: Cigarettes  . Smokeless tobacco: Never Used  . Tobacco comment: quit "about a week ago" - does not think he needs a patch  Substance Use Topics  . Alcohol use:  Yes    Alcohol/week: 10.8 oz    Types: 18 Cans of beer per week    Comment: "I slowed down on that too", reports last drank beer about 3 weeks ago  . Drug use: No     Allergies   Lisinopril   Review of Systems Review of Systems  Constitutional: Negative for appetite change, fatigue and fever.  HENT: Negative for congestion, ear discharge and sinus pressure.   Eyes: Negative for discharge.  Respiratory: Positive for shortness of breath and wheezing. Negative for cough.   Cardiovascular: Negative for chest pain.  Gastrointestinal: Negative for abdominal pain and diarrhea.  Genitourinary: Negative for frequency and hematuria.  Musculoskeletal: Negative for back pain.  Skin: Negative for rash.  Neurological: Negative for seizures and headaches.  Psychiatric/Behavioral: Negative for hallucinations.     Physical Exam Updated Vital Signs BP (!) 165/122   Pulse 95   Temp (!) 97.5 F (36.4 C) (Oral)   Resp (!) 27   Ht 5\' 11"  (1.803 m)   Wt 68 kg (150 lb)   SpO2 98%   BMI 20.92 kg/m   Physical Exam  Constitutional: He is oriented to person, place, and time. He appears well-developed.  HENT:  Head: Normocephalic.  Eyes: Conjunctivae and EOM are normal. No scleral icterus.  Neck: Neck supple. No thyromegaly present.  Cardiovascular: Normal rate and regular rhythm. Exam reveals no gallop and no friction rub.  No murmur heard. Pulmonary/Chest: No stridor. He has wheezes. He has no rales. He exhibits no tenderness.  Abdominal: He exhibits no distension. There is no tenderness. There is no rebound.  Musculoskeletal: Normal range of motion. He exhibits no edema.  Lymphadenopathy:    He has no cervical adenopathy.  Neurological: He is oriented to person, place, and time. He exhibits normal muscle tone. Coordination normal.  Skin: No rash noted. No erythema.  Psychiatric: He has a normal mood and affect. His behavior is normal.     ED Treatments / Results  Labs (all labs  ordered are listed, but only abnormal results are displayed) Labs Reviewed  CBC WITH DIFFERENTIAL/PLATELET - Abnormal; Notable for the following components:      Result Value   RBC 6.00 (*)    Hemoglobin 17.1 (*)    HCT 54.2 (*)    Eosinophils Absolute 1.8 (*)    All other components within normal limits  COMPREHENSIVE METABOLIC PANEL - Abnormal; Notable for the following components:   Chloride 96 (*)    Glucose, Bld 120 (*)    All other components within normal limits  TROPONIN I  BRAIN NATRIURETIC PEPTIDE    EKG  EKG Interpretation None       Radiology Dg Chest Portable 1 View  Result Date: 12/31/2017 CLINICAL DATA:  Shortness of breath and EXAM: PORTABLE CHEST 1 VIEW COMPARISON:  Chest x-ray of December 20, 2017 FINDINGS: The lungs are hyperinflated despite the lordotic positioning. There is no focal infiltrate. There is no pleural effusion. The heart and pulmonary vascularity are normal. The mediastinum  is normal in width. The bony thorax exhibits no acute abnormality. IMPRESSION: COPD. No pneumonia, CHF, nor other acute cardiopulmonary abnormality. Electronically Signed   By: David  Swazil[place]Jordan M.D.   On: 12/31/2017 08:34    Procedures Procedures (including critical care time)  Medications Ordered in ED Medications  hydrALAZINE (APRESOLINE) injection 5 mg (not administered)  albuterol (PROVENTIL) (2.5 MG/3ML) 0.083% nebulizer solution 2.5 mg (not administered)  ipratropium-albuterol (DUONEB) 0.5-2.5 (3) MG/3ML nebulizer solution 3 mL (not administered)  ipratropium-albuterol (DUONEB) 0.5-2.5 (3) MG/3ML nebulizer solution 3 mL (3 mLs Nebulization Given 12/31/17 0826)  methylPREDNISolone sodium succinate (SOLU-MEDROL) 125 mg/2 mL injection 125 mg (125 mg Intravenous Given 12/31/17 0846)  albuterol (PROVENTIL) (2.5 MG/3ML) 0.083% nebulizer solution (2.5 mg  Given 12/31/17 0826)  magnesium sulfate IVPB 2 g 50 mL (0 g Intravenous Stopped 12/31/17 0949)  sodium chloride 0.9 % bolus  1,000 mL (1,000 mLs Intravenous New Bag/Given 12/31/17 0848)  oxyCODONE-acetaminophen (PERCOCET/ROXICET) 5-325 MG per tablet 1 tablet (1 tablet Oral Given 12/31/17 0906)     Initial Impression / Assessment and Plan / ED Course  I have reviewed the triage vital signs and the nursing notes.  Pertinent labs & imaging results that were available during my care of the patient were reviewed by me and considered in my medical decision making (see chart for details). CRITICAL CARE Performed by: Bethann BerkshireJoseph Elisabella Hacker Total critical care time: 40 minutes Critical care time was exclusive of separately billable procedures and treating other patients. Critical care was necessary to treat or prevent imminent or life-threatening deterioration. Critical care was time spent personally by me on the following activities: development of treatment plan with patient and/or surrogate as well as nursing, discussions with consultants, evaluation of patient's response to treatment, examination of patient, obtaining history from patient or surrogate, ordering and performing treatments and interventions, ordering and review of laboratory studies, ordering and review of radiographic studies, pulse oximetry and re-evaluation of patient's condition.    Patient with exacerbation of COPD and hypoxia.  Patient will be admitted to medicine for further treatment   Final Clinical Impressions(s) / ED Diagnoses   Final diagnoses:  COPD exacerbation [facility](HCC)    ED Discharge Orders    None       Bethann BerkshireZammit, Athanasia Stanwood, MD 12/31/17 1002

## 2017-12-31 NOTE — H&P (Signed)
History and Physical    [name]:[id number] DOB: 04-03-1956 DOA: 12/31/2017  PCP: Kerri Perches, MD  Patient coming from: Home  I have personally briefly reviewed patient's old medical records in [facility]  Chief Complaint: Shortness of breath  HPI: [name] is a 62 y.o. male with medical history significant of COPD who is intermittently on oxygen, mostly at night while he sleeps.  Patient reports over the past 4 days, has had progressive shortness of breath, wheezing, cough.  He has had worsening dyspnea on exertion as well as orthopnea.  Denies any peripheral edema.  He has cough productive of yellowish to brown colored sputum.  He is not sure whether he is been febrile, but does report chills.  He has not had any chest pain.  No vomiting or diarrhea.  He does have some sore throat.  Denies any sick contacts.  He was using his nebulizer without significant benefit..  ED Course: Patient was noted to be extremely dyspneic on arrival, received nebulizer treatments as well as steroids with improvement of breathing.  Labs have been unremarkable.  Chest x-ray does not show any acute changes.  Review of Systems: As per HPI otherwise 10 point review of systems negative.    Past Medical History:  Diagnosis Date  . Alcohol abuse   . Asthma   . COPD (chronic obstructive pulmonary disease) (HCC)   . Coronary atherosclerosis    Based on chest CT 2012  . Essential hypertension, benign   . History of neutropenia    Also lymphocytosis - followed by Dr. Zigmund Daniel  . Nicotine addiction     Past Surgical History:  Procedure Laterality Date  . MULTIPLE TOOTH EXTRACTIONS  June 2012     reports that he has been smoking cigarettes.  He has a 38.00 pack-year smoking history. he has never used smokeless tobacco. He reports that he drinks about 10.8 oz of alcohol per week. He reports that he does not use drugs.  Allergies  Allergen Reactions  . Lisinopril Swelling and  Other (See Comments)    Reaction:  Angioedema    Family History  Problem Relation Age of Onset  . Diabetes Mother   . Hypertension Mother   . Diabetes Father   . Hypertension Sister     Prior to Admission medications   Medication Sig Start Date End Date Taking? Authorizing Provider  albuterol (PROVENTIL) (2.5 MG/3ML) 0.083% nebulizer solution Take 3 mLs (2.5 mg total) by nebulization every 6 (six) hours as needed for wheezing or shortness of breath. 12/20/17  Yes Zadie Rhine, MD  aspirin EC 81 MG tablet Take 81 mg by mouth daily.   Yes [provider]  busPIRone (BUSPAR) 5 MG tablet Take 1 tablet (5 mg total) by mouth 2 (two) times daily. 04/11/17  Yes Kerri Perches, MD  citalopram (CELEXA) 20 MG tablet Take 1 tablet (20 mg total) by mouth daily. 04/11/17  Yes Kerri Perches, MD  mometasone-formoterol (DULERA) 200-5 MCG/ACT AERO Inhale 2 puffs 2 (two) times daily into the lungs.   Yes [provider]  ipratropium-albuterol (DUONEB) 0.5-2.5 (3) MG/3ML SOLN Take 3 mLs by nebulization every 6 (six) hours as needed (for wheezing/shortness of breath). 10/19/17 10/24/17  Filbert Schilder, MD    Physical Exam: Vitals:   12/31/17 1009 12/31/17 1110 12/31/17 1341 12/31/17 1424  BP: (!) 158/94 (!) 165/95 (!) 156/89   Pulse: 81 84 82   Resp: (!) 22 20 20  Temp:  97.8 F (36.6 C) 97.6 F (36.4 C)   TempSrc:  Oral Oral   SpO2: 100% 100% 100% 99%  Weight:  68 kg (150 lb 0 oz)    Height:  5\' 11"  (1.803 m)      Constitutional: NAD, calm,  Vitals:   12/31/17 1009 12/31/17 1110 12/31/17 1341 12/31/17 1424  BP: (!) 158/94 (!) 165/95 (!) 156/89   Pulse: 81 84 82   Resp: (!) 22 20 20    Temp:  97.8 F (36.6 C) 97.6 F (36.4 C)   TempSrc:  Oral Oral   SpO2: 100% 100% 100% 99%  Weight:  68 kg (150 lb 0 oz)    Height:  5\' 11"  (1.803 m)     Eyes: PERRL, lids and conjunctivae normal ENMT: Mucous membranes are moist. Posterior pharynx clear of any exudate or  lesions.Normal dentition.  Neck: normal, supple, no masses, no thyromegaly Respiratory: Bilateral wheezing.  Normal respiratory effort. No accessory muscle use.  Cardiovascular: Regular rate and rhythm, no murmurs / rubs / gallops. No extremity edema. 2+ pedal pulses. No carotid bruits.  Abdomen: no tenderness, no masses palpated. No hepatosplenomegaly. Bowel sounds positive.  Musculoskeletal: no clubbing / cyanosis. No joint deformity upper and lower extremities. Good ROM, no contractures. Normal muscle tone.  Skin: no rashes, lesions, ulcers. No induration Neurologic: CN 2-12 grossly intact. Sensation intact, DTR normal. Strength 5/5 in all 4.  Psychiatric: Normal judgment and insight. Alert and oriented x 3. Normal mood.    Labs on Admission: I have personally reviewed following labs and imaging studies  CBC: Recent Labs  Lab 12/31/17 0822  WBC 9.4  NEUTROABS 3.8  HGB 17.1*  HCT 54.2*  MCV 90.3  PLT 280   Basic Metabolic Panel: Recent Labs  Lab 12/31/17 0822  NA 136  K 4.6  CL 96*  CO2 31  GLUCOSE 120*  BUN 19  CREATININE 1.17  CALCIUM 9.4   GFR: Estimated Creatinine Clearance: 65.4 mL/min (by C-G formula based on SCr of 1.17 mg/dL). Liver Function Tests: Recent Labs  Lab 12/31/17 0822  AST 25  ALT 28  ALKPHOS 69  BILITOT 0.5  PROT 8.1  ALBUMIN 4.4   No results for input(s): LIPASE, AMYLASE in the last 168 hours. No results for input(s): AMMONIA in the last 168 hours. Coagulation Profile: No results for input(s): INR, PROTIME in the last 168 hours. Cardiac Enzymes: Recent Labs  Lab 12/31/17 0822  TROPONINI <0.03   BNP (last 3 results) No results for input(s): PROBNP in the last 8760 hours. HbA1C: No results for input(s): HGBA1C in the last 72 hours. CBG: No results for input(s): GLUCAP in the last 168 hours. Lipid Profile: No results for input(s): CHOL, HDL, LDLCALC, TRIG, CHOLHDL, LDLDIRECT in the last 72 hours. Thyroid Function Tests: No  results for input(s): TSH, T4TOTAL, FREET4, T3FREE, THYROIDAB in the last 72 hours. Anemia Panel: No results for input(s): VITAMINB12, FOLATE, FERRITIN, TIBC, IRON, RETICCTPCT in the last 72 hours. Urine analysis:    Component Value Date/Time   COLORURINE YELLOW 10/18/2017 1013   APPEARANCEUR CLEAR 10/18/2017 1013   LABSPEC 1.023 10/18/2017 1013   PHURINE 5.0 10/18/2017 1013   GLUCOSEU NEGATIVE 10/18/2017 1013   HGBUR SMALL (A) 10/18/2017 1013   BILIRUBINUR NEGATIVE 10/18/2017 1013   KETONESUR 5 (A) 10/18/2017 1013   PROTEINUR NEGATIVE 10/18/2017 1013   NITRITE NEGATIVE 10/18/2017 1013   LEUKOCYTESUR NEGATIVE 10/18/2017 1013    Radiological Exams on Admission: Dg Chest Portable  1 View  Result Date: 12/31/2017 CLINICAL DATA:  Shortness of breath and EXAM: PORTABLE CHEST 1 VIEW COMPARISON:  Chest x-ray of December 20, 2017 FINDINGS: The lungs are hyperinflated despite the lordotic positioning. There is no focal infiltrate. There is no pleural effusion. The heart and pulmonary vascularity are normal. The mediastinum is normal in width. The bony thorax exhibits no acute abnormality. IMPRESSION: COPD. No pneumonia, CHF, nor other acute cardiopulmonary abnormality. Electronically Signed   By: David  [place]azil[place]Jordan M.D.   On: 12/31/2017 08:34    EKG: Independently reviewed. Sinus tachycardia  Assessment/Plan Active Problems:   Essential hypertension   Depression with anxiety   COPD exacerbation (HCC)    1. COPD exacerbation.  Continue the patient on duo nebs, intravenous steroids.  Will start a course of azithromycin.  Continue mucolytic's. 2. Hypertension.  Likely exacerbated due to respiratory distress.  Start the patient on Norvasc.  Will use hydralazine as needed 3. depression and anxiety.  Continue on citalopram and buspirone.  DVT prophylaxis: lovenox Code Status: full code Family Communication: no family present Disposition Plan: discharge home once improved Consults called:    Admission status: inpatient, medsurg   Erick BlinksJehanzeb Memon MD [facility] Pager [phone]  If 7PM-7AM, please contact night-coverage [email or web address] Password [email or web address]  12/31/2017, 5:07 PM

## 2017-12-31 NOTE — ED Triage Notes (Signed)
PT c/o SOB at rest with productive yellow cough x3 days with hx of COPD.

## 2018-01-01 ENCOUNTER — Other Ambulatory Visit: Payer: Self-pay | Admitting: Family Medicine

## 2018-01-01 DIAGNOSIS — J441 Chronic obstructive pulmonary disease with (acute) exacerbation: Principal | ICD-10-CM

## 2018-01-01 LAB — BASIC METABOLIC PANEL
Anion gap: 10 (ref 5–15)
BUN: 16 mg/dL (ref 6–20)
CALCIUM: 8.9 mg/dL (ref 8.9–10.3)
CO2: 24 mmol/L (ref 22–32)
Chloride: 102 mmol/L (ref 101–111)
Creatinine, Ser: 1.04 mg/dL (ref 0.61–1.24)
GFR calc Af Amer: >60 mL/min (ref >60)
GLUCOSE: 149 mg/dL — AB (ref 65–99)
Potassium: 4.6 mmol/L (ref 3.5–5.1)
Sodium: 136 mmol/L (ref 135–145)

## 2018-01-01 LAB — CBC
HEMATOCRIT: 47.7 % (ref 39.0–52.0)
Hemoglobin: 15.4 g/dL (ref 13.0–17.0)
MCH: 27.6 pg (ref 26.0–34.0)
MCHC: 32.3 g/dL (ref 30.0–36.0)
MCV: 85.5 fL (ref 78.0–100.0)
Platelets: 255 10*3/uL (ref 150–400)
RBC: 5.58 MIL/uL (ref 4.22–5.81)
RDW: 12.3 % (ref 11.5–15.5)
WBC: 14 10*3/uL — ABNORMAL HIGH (ref 4.0–10.5)

## 2018-01-01 MED ORDER — GUAIFENESIN ER 600 MG PO TB12: 1200.0000 mg | ORAL_TABLET | Freq: Two times a day (BID) | ORAL | 0 refills | Status: AC

## 2018-01-01 MED ORDER — ALBUTEROL SULFATE (2.5 MG/3ML) 0.083% IN NEBU: 2.5000 mg | INHALATION_SOLUTION | Freq: Four times a day (QID) | RESPIRATORY_TRACT | 12 refills | Status: DC | PRN

## 2018-01-01 MED ORDER — PREDNISONE 20 MG PO TABS: 40.0000 mg | ORAL_TABLET | Freq: Every day | ORAL | 0 refills | Status: AC

## 2018-01-01 MED ORDER — AMLODIPINE BESYLATE 5 MG PO TABS: 5.0000 mg | ORAL_TABLET | Freq: Every day | ORAL | 0 refills | Status: DC

## 2018-01-01 MED ORDER — IPRATROPIUM-ALBUTEROL 0.5-2.5 (3) MG/3ML IN SOLN: 3.0000 mL | Freq: Four times a day (QID) | RESPIRATORY_TRACT | 12 refills | Status: DC | PRN

## 2018-01-01 MED ORDER — AZITHROMYCIN 250 MG PO TABS: ORAL_TABLET | ORAL | 0 refills | Status: DC

## 2018-01-01 NOTE — Progress Notes (Signed)
Discharge instructions gone over with patient, verbalized understanding. Printed prescriptions given to patient. Inhaler from patient's med drawer given to patient. Patient understands he needs to see his PCP in 1 week and that he needs to finish his full course of antibiotics in order to feel better. IV removed, patient tolerated procedure well.

## 2018-01-01 NOTE — Care Management Note (Addendum)
Case Management Note  Patient Details  Name: [name] MRN: [id number] Date of Birth: 08-11-1956       Admitted with COPD exacerbation. Pt is from home, working on disability. Has PCP, Syliva OvermanMargaret Simpson. Pt has had 3 admissions in 6 months. He has home oxygen he uses as needed. Has port tanks but says they are all empty. Pt offered port tank to get home with and pt declines, says he will be fine until he gets home. CM will notify Therisa DoyneKathy Cheek, [facility], that pt needs tanks refilled/replaced as soon as possible. Pt has neb machine from previous admission. On previous admissions pt did not have insurance. Pt did not know he now has medicaid. CM notes it was e-verified on admission. Pt says he used to take Symbicort and did much better on that. He stopped taking it because he could not afford it. CM has verified Symbicort is on the preferred medication list for medicaid.  Pt aware and plans to discuss restarting that with his PCP on next visit. Pt says he is out of medications for neb machine, both duonebs and albuterol. CM has paged MD to request new Rx be faxed to pharmacy for DC. Pt asking if he has medicaid can he use another pharmacy (other than [facility]). He likes [facility]. CM told him that he could use any pharmacy he prefers. Pt will DC home with self care. Not homebound and not [facility]. If pt conts to experience exacerbations and frequent hospitalizations, may consider [facility]. Anticipate DC home in next 24 hrs.             Expected Discharge Date:      01/01/2018            Expected Discharge Plan:  Home/Self Care  In-House Referral:  NA  Discharge planning Services  CM Consult  Post Acute Care Choice:  NA Choice offered to:  NA  Status of Service:  Completed, signed off  Malcolm Metro[place], [place] Rumple Demske, RN 01/01/2018, 10:52 AM

## 2018-01-01 NOTE — Discharge Summary (Signed)
Physician Discharge Summary  [name] ZOX:[id number] DOB: Apr 28, 1956 DOA: 12/31/2017  PCP: Kerri Perches, MD  Admit date: 12/31/2017  Discharge date: 01/01/2018  Admitted From:Home  Disposition:  Home  Recommendations for Outpatient Follow-up:  1. Follow up with PCP in 1 week  Home Health: N/A  Equipment/Devices: Pt has home Oxygen at 3L baseline  Discharge Condition: Stable  CODE STATUS: Full  Diet recommendation: Heart Healthy  Brief/Interim Summary:  1. COPD exacerbation-patient admitted with shortness of breath as well as wheezing and cough over last 4 days.  Please see history and physical for full details. Continue the patient on duo nebs at home as needed, oral prednisone 40mg  x 5 days, azithromycin x 6 more days, and mucolytics as prescribed. No acute events noted during this admission and patient is much improved this am. F/U with PCP Dr. Lodema Hong in 1-2 weeks. 2. Hypertension.  Likely exacerbated due to respiratory distress.  Continue on Norvasc at home 3. Depression and anxiety.  Continue on citalopram and buspirone.   Discharge Diagnoses:  Active Problems:   Essential hypertension   Depression with anxiety   COPD exacerbation [facility])   Discharge Instructions  Discharge Instructions    Call MD for:  difficulty breathing, headache or visual disturbances   Complete by:  As directed    Call MD for:  severe uncontrolled pain   Complete by:  As directed    Call MD for:  temperature >100.4   Complete by:  As directed    Diet - low sodium heart healthy   Complete by:  As directed    Increase activity slowly   Complete by:  As directed      Allergies as of 01/01/2018      Reactions   Lisinopril Swelling, Other (See Comments)   Reaction:  Angioedema      Medication List    TAKE these medications   albuterol (2.5 MG/3ML) 0.083% nebulizer solution Commonly known as:  PROVENTIL Take 3 mLs (2.5 mg total) by nebulization every 6 (six) hours as  needed for wheezing or shortness of breath.   amLODipine 5 MG tablet Commonly known as:  NORVASC Take 1 tablet (5 mg total) by mouth daily. Start taking on:  01/02/2018   aspirin EC 81 MG tablet Take 81 mg by mouth daily.   azithromycin 250 MG tablet Commonly known as:  ZITHROMAX Take 1 by mouth daily for 6 days Start taking on:  01/02/2018   busPIRone 5 MG tablet Commonly known as:  BUSPAR Take 1 tablet (5 mg total) by mouth 2 (two) times daily.   citalopram 20 MG tablet Commonly known as:  CELEXA Take 1 tablet (20 mg total) by mouth daily.   DULERA 200-5 MCG/ACT Aero Generic drug:  mometasone-formoterol Inhale 2 puffs 2 (two) times daily into the lungs.   guaiFENesin 600 MG 12 hr tablet Commonly known as:  MUCINEX Take 2 tablets (1,200 mg total) by mouth 2 (two) times daily for 7 days.   ipratropium-albuterol 0.5-2.5 (3) MG/3ML Soln Commonly known as:  DUONEB Take 3 mLs by nebulization every 6 (six) hours as needed (for wheezing/shortness of breath).   predniSONE 20 MG tablet Commonly known as:  DELTASONE Take 2 tablets (40 mg total) by mouth daily for 5 days.      Follow-up Information    Kerri Perches, MD Follow up in 1 week(s).   Specialty:  Family Medicine Contact information: [address] [place] [address] [phone]  Allergies  Allergen Reactions  . Lisinopril Swelling and Other (See Comments)    Reaction:  Angioedema    Consultations:  None   Procedures/Studies: Dg Chest Portable 1 View  Result Date: 12/31/2017 CLINICAL DATA:  Shortness of breath and EXAM: PORTABLE CHEST 1 VIEW COMPARISON:  Chest x-ray of December 20, 2017 FINDINGS: The lungs are hyperinflated despite the lordotic positioning. There is no focal infiltrate. There is no pleural effusion. The heart and pulmonary vascularity are normal. The mediastinum is normal in width. The bony thorax exhibits no acute abnormality. IMPRESSION: COPD. No pneumonia,  CHF, nor other acute cardiopulmonary abnormality. Electronically Signed   By: David  [place] M.D.   On: 12/31/2017 08:34   Dg Chest Portable 1 View  Result Date: 12/20/2017 CLINICAL DATA:  Shortness of breath and cough. History of asthma, COPD, hypertension, current smoker. EXAM: PORTABLE CHEST 1 VIEW COMPARISON:  10/18/2017 FINDINGS: The heart size and mediastinal contours are within normal limits. Both lungs are clear. The visualized skeletal structures are unremarkable. IMPRESSION: No active disease. Electronically Signed   By: Burman Nieves M.D.   On: 12/20/2017 02:18     Discharge Exam: Vitals:   01/01/18 0631 01/01/18 0750  BP: 127/80   Pulse: 97   Resp: 20   Temp: 98.2 F (36.8 C)   SpO2: 97% 96%   Vitals:   12/31/17 2123 01/01/18 0242 01/01/18 0631 01/01/18 0750  BP: 126/75  127/80   Pulse: 75  97   Resp: 20  20   Temp: 98.4 F (36.9 C)  98.2 F (36.8 C)   TempSrc: Oral  Oral   SpO2: 99% 97% 97% 96%  Weight:      Height:        General: Pt is alert, awake, not in acute distress Cardiovascular: RRR, S1/S2 +, no rubs, no gallops Respiratory: CTA bilaterally, no wheezing, no rhonchi Abdominal: Soft, NT, ND, bowel sounds + Extremities: no edema, no cyanosis    The results of significant diagnostics from this hospitalization (including imaging, microbiology, ancillary and laboratory) are listed below for reference.     Microbiology: No results found for this or any previous visit (from the past 240 hour(s)).   Labs: BNP (last 3 results) Recent Labs    10/18/17 1851 12/31/17 0822  BNP 42.0 17.0   Basic Metabolic Panel: Recent Labs  Lab 12/31/17 0822 01/01/18 0534  NA 136 136  K 4.6 4.6  CL 96* 102  CO2 31 24  GLUCOSE 120* 149*  BUN 19 16  CREATININE 1.17 1.04  CALCIUM 9.4 8.9   Liver Function Tests: Recent Labs  Lab 12/31/17 0822  AST 25  ALT 28  ALKPHOS 69  BILITOT 0.5  PROT 8.1  ALBUMIN 4.4   No results for input(s): LIPASE, AMYLASE  in the last 168 hours. No results for input(s): AMMONIA in the last 168 hours. CBC: Recent Labs  Lab 12/31/17 0822 01/01/18 0534  WBC 9.4 14.0*  NEUTROABS 3.8  --   HGB 17.1* 15.4  HCT 54.2* 47.7  MCV 90.3 85.5  PLT 280 255   Cardiac Enzymes: Recent Labs  Lab 12/31/17 0822  TROPONINI <0.03   BNP: Invalid input(s): POCBNP CBG: No results for input(s): GLUCAP in the last 168 hours. D-Dimer No results for input(s): DDIMER in the last 72 hours. Hgb A1c No results for input(s): HGBA1C in the last 72 hours. Lipid Profile No results for input(s): CHOL, HDL, LDLCALC, TRIG, CHOLHDL, LDLDIRECT in the last 72 hours. Thyroid  function studies No results for input(s): TSH, T4TOTAL, T3FREE, THYROIDAB in the last 72 hours.  Invalid input(s): FREET3 Anemia work up No results for input(s): VITAMINB12, FOLATE, FERRITIN, TIBC, IRON, RETICCTPCT in the last 72 hours. Urinalysis    Component Value Date/Time   COLORURINE YELLOW 10/18/2017 1013   APPEARANCEUR CLEAR 10/18/2017 1013   LABSPEC 1.023 10/18/2017 1013   PHURINE 5.0 10/18/2017 1013   GLUCOSEU NEGATIVE 10/18/2017 1013   HGBUR SMALL (A) 10/18/2017 1013   BILIRUBINUR NEGATIVE 10/18/2017 1013   KETONESUR 5 (A) 10/18/2017 1013   PROTEINUR NEGATIVE 10/18/2017 1013   NITRITE NEGATIVE 10/18/2017 1013   LEUKOCYTESUR NEGATIVE 10/18/2017 1013   Sepsis Labs Invalid input(s): PROCALCITONIN,  WBC,  LACTICIDVEN Microbiology No results found for this or any previous visit (from the past 240 hour(s)).   Time coordinating discharge: Over 30 minutes  SIGNED:   Erick BlinksPratik D Lanah Steines, DO [facility] 01/01/2018, 11:02 AM Pager [phone]  If 7PM-7AM, please contact night-coverage [email or web address] Password [email or web address]

## 2018-01-02 ENCOUNTER — Telehealth: Payer: Self-pay | Admitting: Family Medicine

## 2018-01-02 NOTE — Telephone Encounter (Signed)
Can use same day appt 1/30 at 10:00

## 2018-01-02 NOTE — Telephone Encounter (Signed)
The patient is calling for a transitional appt from hosp, released 01-01-18, I didn't see a open slot? Please advise, I also advised that I would call him back

## 2018-01-03 NOTE — Telephone Encounter (Signed)
Called and advised pt 1-30 at 10am

## 2018-01-15 ENCOUNTER — Encounter: Payer: Self-pay | Admitting: Family Medicine

## 2018-01-15 ENCOUNTER — Ambulatory Visit (INDEPENDENT_AMBULATORY_CARE_PROVIDER_SITE_OTHER): Payer: Medicaid Other | Admitting: Family Medicine

## 2018-01-15 VITALS — BP 126/84 | HR 78 | Temp 98.1°F | Ht 68.0 in | Wt 161.0 lb

## 2018-01-15 DIAGNOSIS — F10288 Alcohol dependence with other alcohol-induced disorder: Secondary | ICD-10-CM | POA: Diagnosis not present

## 2018-01-15 DIAGNOSIS — F418 Other specified anxiety disorders: Secondary | ICD-10-CM | POA: Diagnosis not present

## 2018-01-15 DIAGNOSIS — Z0000 Encounter for general adult medical examination without abnormal findings: Secondary | ICD-10-CM

## 2018-01-15 DIAGNOSIS — Z09 Encounter for follow-up examination after completed treatment for conditions other than malignant neoplasm: Secondary | ICD-10-CM | POA: Diagnosis not present

## 2018-01-15 DIAGNOSIS — I10 Essential (primary) hypertension: Secondary | ICD-10-CM

## 2018-01-15 MED ORDER — BUSPIRONE HCL 5 MG PO TABS: 5.0000 mg | ORAL_TABLET | Freq: Two times a day (BID) | ORAL | 5 refills | Status: DC

## 2018-01-15 MED ORDER — CITALOPRAM HYDROBROMIDE 20 MG PO TABS: 20.0000 mg | ORAL_TABLET | Freq: Every day | ORAL | 3 refills | Status: DC

## 2018-01-15 MED ORDER — ALBUTEROL SULFATE HFA 108 (90 BASE) MCG/ACT IN AERS: 2.0000 | INHALATION_SPRAY | Freq: Four times a day (QID) | RESPIRATORY_TRACT | 2 refills | Status: DC | PRN

## 2018-01-15 MED ORDER — TRIAMTERENE-HCTZ 37.5-25 MG PO TABS: 1.0000 | ORAL_TABLET | Freq: Every day | ORAL | 5 refills | Status: DC

## 2018-01-15 MED ORDER — MOMETASONE FURO-FORMOTEROL FUM 200-5 MCG/ACT IN AERO: 2.0000 | INHALATION_SPRAY | Freq: Two times a day (BID) | RESPIRATORY_TRACT | 11 refills | Status: DC

## 2018-01-15 NOTE — Assessment & Plan Note (Addendum)
Hospitalized from 1/15 to 1/16 for COPD exacerbation, records reviewed. Marked improvement. Medications are sent to his local pharmacy, now he is insured and will be compoliant

## 2018-01-15 NOTE — Patient Instructions (Signed)
Annual physical end March, call if you need me before  CBC, fasting lipid, cmp , pSA, vit D and TSH  Third week in March ]   Please join AA group and continue to keep OFF of cigarettes and alcohol  ALL medication is at [facility]  Thanks for [facility], we consider it a privelige to serve you.

## 2018-01-29 ENCOUNTER — Encounter: Payer: Self-pay | Admitting: Family Medicine

## 2018-01-29 NOTE — Assessment & Plan Note (Signed)
Controlled, no change in medication  

## 2018-01-29 NOTE — Assessment & Plan Note (Signed)
Improved, needs to resume medication on a daily basis

## 2018-01-29 NOTE — Assessment & Plan Note (Signed)
Counseled to quit entirely an encouraged to go to [employer] meetings

## 2018-01-29 NOTE — Progress Notes (Signed)
   [name]     MRN: [id number]      DOB: [birth year]   HPI [name] is here for follow up of recent hospitalization from 1/15 to 01/01/2018 for COPD flare. Breathing much improved, still has some wheezing and shortness of breath denies fever, chills or sputum production. Now has insurance and has got his disability, so finally able to get his medication which is a blessing Has quit nicotine use and cut back on alcohol Hospital course is reviewed and follow up in the office is markedly delayed, no lab or radiologic data were odered  ROS See HPI . Denies sinus pressure, nasal congestion, ear pain or sore throat.  Denies chest pains, palpitations and leg swelling Denies abdominal pain, nausea, vomiting,diarrhea or constipation.   Denies dysuria, frequency, hesitancy or incontinence. Denies joint pain, swelling and limitation in mobility. Denies headaches, seizures, numbness, or tingling. Denies depression, uncontrolled nxiety or insomnia. Denies skin break down or rash.   PE  BP 126/84   Pulse 78   Temp 98.1 F (36.7 C)   Ht 5\' 8"  (1.727 m)   Wt 161 lb 0.6 oz (73 kg)   SpO2 96%   BMI 24.49 kg/m   Patient alert and oriented and in no cardiopulmonary distress.  HEENT: No facial asymmetry, EOMI,   oropharynx pink and moist.  Neck supple no JVD, no mass.  Chest: Clear to auscultation bilaterally.Decreased air entry  CVS: S1, S2 no murmurs, no S3.Regular rate.  ABD: Soft non tender.   Ext: No edema  MS: Adequate ROM spine, shoulders, hips and knees.  Skin: Intact, no ulcerations or rash noted.  Psych: Good eye contact, normal affect. Memory intact not anxious or depressed appearing.  CNS: CN 2-12 intact, power,  normal throughout.no focal deficits noted.   Assessment & Plan  Hospital discharge follow-up Hospitalized from 1/15 to 1/16 for COPD exacerbation, records reviewed. Marked improvement. Medications are sent to his local pharmacy, now he is  insured and will be compoliant  Essential hypertension Controlled, no change in medication   Depression with anxiety Improved, needs to resume medication on a daily basis  Alcohol addiction (HCC) Counseled to quit entirely an encouraged to go to [employer] meetings

## 2018-02-24 ENCOUNTER — Ambulatory Visit: Payer: Self-pay | Admitting: Family Medicine

## 2018-03-11 LAB — COMPREHENSIVE METABOLIC PANEL
AG RATIO: 1.8 (calc) (ref 1.0–2.5)
ALT: 25 U/L (ref 9–46)
AST: 23 U/L (ref 10–35)
Albumin: 4.3 g/dL (ref 3.6–5.1)
Alkaline phosphatase (APISO): 71 U/L (ref 40–115)
BILIRUBIN TOTAL: 0.4 mg/dL (ref 0.2–1.2)
BUN: 22 mg/dL (ref 7–25)
CALCIUM: 9.2 mg/dL (ref 8.6–10.3)
CO2: 29 mmol/L (ref 20–32)
Chloride: 106 mmol/L (ref 98–110)
Creat: 1.26 mg/dL (ref 0.70–1.33)
GLUCOSE: 94 mg/dL (ref 65–99)
Globulin: 2.4 g/dL (calc) (ref 1.9–3.7)
Potassium: 4.7 mmol/L (ref 3.5–5.3)
Sodium: 141 mmol/L (ref 135–146)
Total Protein: 6.7 g/dL (ref 6.1–8.1)

## 2018-03-11 LAB — PSA: PSA: 0.6 ng/mL (ref <=4.0)

## 2018-03-11 LAB — CBC
HCT: 44.7 % (ref 38.5–50.0)
Hemoglobin: 15 g/dL (ref 13.2–17.1)
MCH: 28 pg (ref 27.0–33.0)
MCHC: 33.6 g/dL (ref 32.0–36.0)
MCV: 83.4 fL (ref 80.0–100.0)
MPV: 10.1 fL (ref 7.5–12.5)
PLATELETS: 275 10*3/uL (ref 140–400)
RBC: 5.36 10*6/uL (ref 4.20–5.80)
RDW: 11.7 % (ref 11.0–15.0)
WBC: 8 10*3/uL (ref 3.8–10.8)

## 2018-03-11 LAB — LIPID PANEL
CHOLESTEROL: 152 mg/dL (ref <200)
HDL: 59 mg/dL (ref >40)
LDL CHOLESTEROL (CALC): 76 mg/dL
Non-HDL Cholesterol (Calc): 93 mg/dL (calc) (ref <130)
TRIGLYCERIDES: 83 mg/dL (ref <150)
Total CHOL/HDL Ratio: 2.6 (calc) (ref <5.0)

## 2018-03-11 LAB — VITAMIN D 25 HYDROXY (VIT D DEFICIENCY, FRACTURES): Vit D, 25-Hydroxy: 18 ng/mL — ABNORMAL LOW (ref 30–100)

## 2018-03-11 LAB — TSH: TSH: 2.24 mIU/L (ref 0.40–4.50)

## 2018-03-13 ENCOUNTER — Encounter: Payer: Self-pay | Admitting: Family Medicine

## 2018-03-13 ENCOUNTER — Ambulatory Visit (INDEPENDENT_AMBULATORY_CARE_PROVIDER_SITE_OTHER): Payer: Medicaid Other | Admitting: Family Medicine

## 2018-03-13 VITALS — BP 126/86 | HR 86 | Resp 16 | Ht 68.0 in | Wt 165.0 lb

## 2018-03-13 DIAGNOSIS — B353 Tinea pedis: Secondary | ICD-10-CM | POA: Diagnosis not present

## 2018-03-13 DIAGNOSIS — Z0000 Encounter for general adult medical examination without abnormal findings: Secondary | ICD-10-CM | POA: Diagnosis not present

## 2018-03-13 DIAGNOSIS — F17218 Nicotine dependence, cigarettes, with other nicotine-induced disorders: Secondary | ICD-10-CM | POA: Diagnosis not present

## 2018-03-13 MED ORDER — TERBINAFINE HCL 250 MG PO TABS: 250.0000 mg | ORAL_TABLET | Freq: Every day | ORAL | 2 refills | Status: DC

## 2018-03-13 MED ORDER — ERGOCALCIFEROL 1.25 MG (50000 UT) PO CAPS: 50000.0000 [IU] | ORAL_CAPSULE | ORAL | 3 refills | Status: DC

## 2018-03-13 NOTE — Patient Instructions (Addendum)
F/u in 6 months, call if you need me before  New once daily tablet for fungal infection of skin on feet which is peeling, this is terbinafine 250 mg one daily  New is once weekly vitamin D    Labs are excellent  Congrats on no alcohol, I recommend you join an AA group to help yourself stay alcohol free and to help other  People who are alcoholic remain and become sober  Please work on quitting smoking, you are al;most there!!!

## 2018-03-13 NOTE — Assessment & Plan Note (Addendum)
Annual exam as documented. Counseling done  re healthy lifestyle involving commitment to 150 minutes exercise per week, heart healthy diet, remaining alcohol free, and achieving nicotine free state. Immunization and cancer screening needs are specifically addressed at this visit.

## 2018-03-14 ENCOUNTER — Encounter: Payer: Self-pay | Admitting: Family Medicine

## 2018-03-14 DIAGNOSIS — B353 Tinea pedis: Secondary | ICD-10-CM | POA: Insufficient documentation

## 2018-03-14 NOTE — Progress Notes (Addendum)
   [name]     MRN: [id number]      DOB: [birth year]   HPI: Patient is in for annual physical exam. No other health concerns are expressed or addressed at the visit. Recent labs,  are reviewed.and are excellent, only needs to be diligent with vit D supplement   PE; BP 126/86   Pulse 86   Resp 16   Ht 5\' 8"  (1.727 m)   Wt 165 lb (74.8 kg)   SpO2 98%   BMI 25.09 kg/m   Pleasant male, alert and oriented x 3, in no cardio-pulmonary distress. Afebrile. HEENT No facial trauma or asymetry. Sinuses non tender. EOMI, pupils equally reactive to light. External ears normal, tympanic membranes clear. Oropharynx moist, no exudate.Poor dentition Neck: supple, no adenopathy,JVD or thyromegaly.No bruits.  Chest: Clear to ascultation bilaterally.No crackles or wheezes. Non tender to palpation, decreased air entry bilaterally  Breast: No asymetry,no masses. No nipple discharge or inversion. No axillary or supraclavicular adenopathy  Cardiovascular system; Heart sounds normal,  S1 and  S2 ,no S3.  No murmur, or thrill. Apical beat not displaced Peripheral pulses normal. Clubbing of all digits  Abdomen: Soft, non tender, no organomegaly or masses. No bruits. Bowel sounds normal. No guarding, tenderness or rebound.  Rectal:  Pt to return stool cards   Musculoskeletal exam: Full ROM of spine, hips , shoulders and knees. No deformity ,swelling or crepitus noted. No muscle wasting or atrophy.   Neurologic: Cranial nerves 2 to 12 intact. Power, tone ,sensation and reflexes normal throughout. No disturbance in gait. No tremor.  Skin: Intact,  scaling  rash noted.extensively on both feet Pigmentation normal throughout  Psych; Normal mood and affect. Judgement and concentration normal   Assessment & Plan:  Annual physical exam Annual exam as documented. Counseling done  re healthy lifestyle involving commitment to 150 minutes exercise per week, heart healthy diet,  remaining alcohol free, and achieving nicotine free state. Immunization and cancer screening needs are specifically addressed at this visit.   Nicotine dependence, cigarettes, with other nicotine-induced disorders Asked: confirms current 7/day Assess: not ready to set quit date but wants to quit Advise: need to quit to reduce risk of lung failure, progressive heart disease,both of which he already has, and all types of cancer Arrange: 1800 QUIT NOW # use encouraged, also classes through health dept and smoking cessation material provided Arrange : f/u in 3 to 5 months Counseling time 5 mins  Tinea pedis Terbinafine prescribed x 12 weeks   \

## 2018-03-14 NOTE — Assessment & Plan Note (Signed)
Terbinafine prescribed x 12 weeks 

## 2018-03-14 NOTE — Assessment & Plan Note (Signed)
Asked: confirms current 7/day Assess: not ready to set quit date but wants to quit Advise: need to quit to reduce risk of lung failure, progressive heart disease,both of which he already has, and all types of cancer Arrange: 1800 QUIT NOW # use encouraged, also classes through health dept and smoking cessation material provided Arrange : f/u in 3 to 5 months Counseling time 5 mins

## 2018-03-17 DIAGNOSIS — Z1211 Encounter for screening for malignant neoplasm of colon: Secondary | ICD-10-CM

## 2018-03-17 LAB — HEMOCCULT GUIAC POC 1CARD (OFFICE)
Card #2 Fecal Occult Blod, POC: NEGATIVE
FECAL OCCULT BLD: NEGATIVE
FECAL OCCULT BLD: NEGATIVE

## 2018-04-15 ENCOUNTER — Other Ambulatory Visit: Payer: Self-pay | Admitting: Family Medicine

## 2018-06-06 ENCOUNTER — Encounter: Payer: Self-pay | Admitting: Family Medicine

## 2018-06-13 ENCOUNTER — Other Ambulatory Visit: Payer: Self-pay | Admitting: Family Medicine

## 2018-06-30 ENCOUNTER — Telehealth: Payer: Self-pay | Admitting: Family Medicine

## 2018-06-30 ENCOUNTER — Other Ambulatory Visit: Payer: Self-pay | Admitting: Family Medicine

## 2018-06-30 DIAGNOSIS — R39198 Other difficulties with micturition: Secondary | ICD-10-CM

## 2018-06-30 NOTE — Telephone Encounter (Signed)
have referred to [facility] please call and schedule soonest available [facility]

## 2018-06-30 NOTE — Telephone Encounter (Signed)
Patients nephew called in with concern today because patient is having trouble urinating. Says he has to strain to urinate. Says this has been going on for over 3 months and is only gettting worse. Next available appt is at the end of July for Dr.Simpson, unless she would like me to make his an appointment with urology. Cb# [phone]. Nephew states he is wiling to [facility] if he can get the soonest appt.

## 2018-08-08 ENCOUNTER — Ambulatory Visit: Payer: Medicaid Other | Admitting: Urology

## 2018-08-19 ENCOUNTER — Other Ambulatory Visit: Payer: Self-pay | Admitting: Family Medicine

## 2018-09-11 ENCOUNTER — Ambulatory Visit: Payer: Medicaid Other | Admitting: Family Medicine

## 2018-09-23 ENCOUNTER — Encounter: Payer: Self-pay | Admitting: Family Medicine

## 2018-09-23 ENCOUNTER — Ambulatory Visit (INDEPENDENT_AMBULATORY_CARE_PROVIDER_SITE_OTHER): Payer: BLUE CROSS/BLUE SHIELD | Admitting: Family Medicine

## 2018-09-23 VITALS — BP 120/80 | HR 76 | Resp 16 | Ht 68.0 in | Wt 155.4 lb

## 2018-09-23 DIAGNOSIS — F17218 Nicotine dependence, cigarettes, with other nicotine-induced disorders: Secondary | ICD-10-CM | POA: Diagnosis not present

## 2018-09-23 DIAGNOSIS — Z125 Encounter for screening for malignant neoplasm of prostate: Secondary | ICD-10-CM

## 2018-09-23 DIAGNOSIS — F418 Other specified anxiety disorders: Secondary | ICD-10-CM

## 2018-09-23 DIAGNOSIS — Z23 Encounter for immunization: Secondary | ICD-10-CM

## 2018-09-23 DIAGNOSIS — J432 Centrilobular emphysema: Secondary | ICD-10-CM

## 2018-09-23 DIAGNOSIS — I10 Essential (primary) hypertension: Secondary | ICD-10-CM | POA: Diagnosis not present

## 2018-09-23 DIAGNOSIS — F10288 Alcohol dependence with other alcohol-induced disorder: Secondary | ICD-10-CM

## 2018-09-23 DIAGNOSIS — E559 Vitamin D deficiency, unspecified: Secondary | ICD-10-CM

## 2018-09-23 DIAGNOSIS — F411 Generalized anxiety disorder: Secondary | ICD-10-CM

## 2018-09-23 MED ORDER — IPRATROPIUM-ALBUTEROL 0.5-2.5 (3) MG/3ML IN SOLN: 3.0000 mL | Freq: Four times a day (QID) | RESPIRATORY_TRACT | 3 refills | Status: DC | PRN

## 2018-09-23 NOTE — Patient Instructions (Addendum)
Physical exam first week in April, call if you need me before  CBC, fasting lipid, cmp and eGFR, PSA, tSH , vit D March 31 or shortly after  Flu vaccine today, hold on pneumonia   Keep working on QUITTING totally cigarettes, also cut back weekend beer amount  Thank you  for choosing [facility]. We consider it a privelige to serve you.  Delivering excellent health care in a caring and  compassionate way is our goal.  Partnering with you,  so that together we can achieve this goal is our strategy.

## 2018-10-12 ENCOUNTER — Encounter: Payer: Self-pay | Admitting: Family Medicine

## 2018-10-12 NOTE — Assessment & Plan Note (Signed)
Controlled, no change in medication  

## 2018-10-12 NOTE — Assessment & Plan Note (Signed)
Repots drinking beer only and in lesser amt than n the past , recommended that he quit entirely and seek help through AA

## 2018-10-12 NOTE — Assessment & Plan Note (Signed)
Asked : confirm currently smokes cigarettes Assess: not willing to set quit date but cutting back Advise: needs to quit too reduce lung failure risk and also CAD and stroke and cancer Assist: counseled for 5 min and literature provided for help to quit Arrange f/u in 5 months

## 2018-10-12 NOTE — Progress Notes (Signed)
   MUREL SHENBERGER     MRN: [id number]      DOB: [birth year]   HPI Mr. Heinze is here for follow up and re-evaluation of chronic medical conditions, medication management and review of any available recent lab and radiology data.  Preventive health is updated, specifically  Cancer screening and Immunization.   The PT denies any adverse reactions to current medications since the last visit.  There are no new concerns.  There are no specific complaints   ROS Denies recent fever or chills. Denies sinus pressure, nasal congestion, ear pain or sore throat. Denies chest congestion, productive cough or wheezing. Denies chest pains, palpitations and leg swelling Denies abdominal pain, nausea, vomiting,diarrhea or constipation.   Denies dysuria, frequency, hesitancy or incontinence. Denies joint pain, swelling and limitation in mobility. Denies headaches, seizures, numbness, or tingling. Denies depression, anxiety or insomnia. Denies skin break down or rash.   PE  BP 120/80   Pulse 76   Resp 16   Ht 5\' 8"  (1.727 m)   Wt 155 lb 6.4 oz (70.5 kg)   SpO2 96%   BMI 23.63 kg/m   Patient alert and oriented and in no cardiopulmonary distress.  HEENT: No facial asymmetry, EOMI,   oropharynx pink and moist.  Neck supple no JVD, no mass.  Chest: Clear to auscultation bilaterally.decreased air entry throughout  CVS: S1, S2 no murmurs, no S3.Regular rate.  ABD: Soft non tender.   Ext: No edema  MS: Adequate ROM spine, shoulders, hips and knees.  Skin: Intact, no ulcerations or rash noted.  Psych: Good eye contact, normal affect. Memory intact not anxious or depressed appearing.  CNS: CN 2-12 intact, power,  normal throughout.no focal deficits noted.   Assessment & Plan  Essential hypertension Controlled, no change in medication DASH diet and commitment to daily physical activity for a minimum of 30 minutes discussed and encouraged, as a part of hypertension management. The  importance of attaining a healthy weight is also discussed.  BP/Weight 09/23/2018 03/13/2018 01/15/2018 01/01/2018 12/31/2017 12/20/2017 10/31/2017  Systolic BP 120 126 126 127 - 409 132  Diastolic BP 80 86 84 80 - 89 90  Wt. (Lbs) 155.4 165 161.04 - 150 150 152  BMI 23.63 25.09 24.49 - 20.92 20.92 23.11       GAD (generalized anxiety disorder) Controlled, no change in medication   Depression with anxiety Controlled, no change in medication   Nicotine dependence, cigarettes, with other nicotine-induced disorders Asked : confirm currently smokes cigarettes Assess: not willing to set quit date but cutting back Advise: needs to quit too reduce lung failure risk and also CAD and stroke and cancer Assist: counseled for 5 min and literature provided for help to quit Arrange f/u in 5 months  Alcohol addiction (HCC) Repots drinking beer only and in lesser amt than n the past , recommended that he quit entirely and seek help through AA  Centrilobular emphysema (HCC) Stable , but worsening due to ongoing nicotine use, needs to quit smoking

## 2018-10-12 NOTE — Assessment & Plan Note (Signed)
Controlled, no change in medication DASH diet and commitment to daily physical activity for a minimum of 30 minutes discussed and encouraged, as a part of hypertension management. The importance of attaining a healthy weight is also discussed.  BP/Weight 09/23/2018 03/13/2018 01/15/2018 01/01/2018 12/31/2017 12/20/2017 10/31/2017  Systolic BP 120 126 126 127 - 161 132  Diastolic BP 80 86 84 80 - 89 90  Wt. (Lbs) 155.4 165 161.04 - 150 150 152  BMI 23.63 25.09 24.49 - 20.92 20.92 23.11

## 2018-10-12 NOTE — Assessment & Plan Note (Signed)
Stable , but worsening due to ongoing nicotine use, needs to quit smoking

## 2018-12-05 ENCOUNTER — Other Ambulatory Visit: Payer: Self-pay | Admitting: Family Medicine

## 2019-01-28 ENCOUNTER — Other Ambulatory Visit: Payer: Self-pay | Admitting: Family Medicine

## 2019-02-06 ENCOUNTER — Other Ambulatory Visit: Payer: Self-pay | Admitting: Family Medicine

## 2019-03-13 ENCOUNTER — Other Ambulatory Visit: Payer: Self-pay | Admitting: Family Medicine

## 2019-05-20 ENCOUNTER — Telehealth: Payer: Self-pay

## 2019-05-20 ENCOUNTER — Ambulatory Visit: Payer: BLUE CROSS/BLUE SHIELD | Admitting: Family Medicine

## 2019-05-20 DIAGNOSIS — I10 Essential (primary) hypertension: Secondary | ICD-10-CM

## 2019-05-20 DIAGNOSIS — Z13220 Encounter for screening for lipoid disorders: Secondary | ICD-10-CM

## 2019-05-20 DIAGNOSIS — E559 Vitamin D deficiency, unspecified: Secondary | ICD-10-CM

## 2019-05-20 DIAGNOSIS — R39198 Other difficulties with micturition: Secondary | ICD-10-CM

## 2019-05-20 NOTE — Telephone Encounter (Signed)
Expired labs reordered  

## 2019-05-23 LAB — CBC
HCT: 50.3 % — ABNORMAL HIGH (ref 38.5–50.0)
Hemoglobin: 16.7 g/dL (ref 13.2–17.1)
MCH: 28.5 pg (ref 27.0–33.0)
MCHC: 33.2 g/dL (ref 32.0–36.0)
MCV: 86 fL (ref 80.0–100.0)
MPV: 10.3 fL (ref 7.5–12.5)
Platelets: 238 10*3/uL (ref 140–400)
RBC: 5.85 10*6/uL — ABNORMAL HIGH (ref 4.20–5.80)
RDW: 11.7 % (ref 11.0–15.0)
WBC: 6.4 10*3/uL (ref 3.8–10.8)

## 2019-05-23 LAB — COMPLETE METABOLIC PANEL WITH GFR
AG Ratio: 1.4 (calc) (ref 1.0–2.5)
ALT: 15 U/L (ref 9–46)
AST: 24 U/L (ref 10–35)
Albumin: 4.3 g/dL (ref 3.6–5.1)
Alkaline phosphatase (APISO): 67 U/L (ref 35–144)
BUN: 11 mg/dL (ref 7–25)
CO2: 26 mmol/L (ref 20–32)
Calcium: 9.3 mg/dL (ref 8.6–10.3)
Chloride: 101 mmol/L (ref 98–110)
Creat: 1.07 mg/dL (ref 0.70–1.25)
GFR, Est African American: 87 mL/min/{1.73_m2} (ref >=60)
GFR, Est Non African American: 75 mL/min/{1.73_m2} (ref >=60)
Globulin: 3.1 g/dL (calc) (ref 1.9–3.7)
Glucose, Bld: 78 mg/dL (ref 65–99)
Potassium: 4.6 mmol/L (ref 3.5–5.3)
Sodium: 139 mmol/L (ref 135–146)
Total Bilirubin: 0.9 mg/dL (ref 0.2–1.2)
Total Protein: 7.4 g/dL (ref 6.1–8.1)

## 2019-05-23 LAB — LIPID PANEL
Cholesterol: 182 mg/dL (ref <200)
HDL: 89 mg/dL (ref >=40)
LDL Cholesterol (Calc): 76 mg/dL (calc)
Non-HDL Cholesterol (Calc): 93 mg/dL (calc) (ref <130)
Total CHOL/HDL Ratio: 2 (calc) (ref <5.0)
Triglycerides: 89 mg/dL (ref <150)

## 2019-05-23 LAB — VITAMIN D 25 HYDROXY (VIT D DEFICIENCY, FRACTURES): Vit D, 25-Hydroxy: 21 ng/mL — ABNORMAL LOW (ref 30–100)

## 2019-05-23 LAB — PSA: PSA: 1.4 ng/mL (ref <=4.0)

## 2019-05-23 LAB — TSH: TSH: 2.92 mIU/L (ref 0.40–4.50)

## 2019-05-26 ENCOUNTER — Encounter: Payer: Self-pay | Admitting: Family Medicine

## 2019-05-26 ENCOUNTER — Ambulatory Visit (INDEPENDENT_AMBULATORY_CARE_PROVIDER_SITE_OTHER): Payer: BC Managed Care – PPO | Admitting: Family Medicine

## 2019-05-26 ENCOUNTER — Other Ambulatory Visit: Payer: Self-pay

## 2019-05-26 VITALS — BP 120/80 | Ht 68.0 in | Wt 155.0 lb

## 2019-05-26 DIAGNOSIS — F418 Other specified anxiety disorders: Secondary | ICD-10-CM | POA: Diagnosis not present

## 2019-05-26 DIAGNOSIS — F17218 Nicotine dependence, cigarettes, with other nicotine-induced disorders: Secondary | ICD-10-CM

## 2019-05-26 DIAGNOSIS — E559 Vitamin D deficiency, unspecified: Secondary | ICD-10-CM

## 2019-05-26 DIAGNOSIS — F411 Generalized anxiety disorder: Secondary | ICD-10-CM

## 2019-05-26 DIAGNOSIS — J432 Centrilobular emphysema: Secondary | ICD-10-CM | POA: Diagnosis not present

## 2019-05-26 DIAGNOSIS — F10288 Alcohol dependence with other alcohol-induced disorder: Secondary | ICD-10-CM

## 2019-05-26 DIAGNOSIS — I10 Essential (primary) hypertension: Secondary | ICD-10-CM

## 2019-05-26 MED ORDER — ERGOCALCIFEROL 1.25 MG (50000 UT) PO CAPS: 50000.0000 [IU] | ORAL_CAPSULE | ORAL | 3 refills | Status: DC

## 2019-05-26 MED ORDER — IPRATROPIUM-ALBUTEROL 0.5-2.5 (3) MG/3ML IN SOLN: 3.0000 mL | Freq: Four times a day (QID) | RESPIRATORY_TRACT | 1 refills | Status: DC | PRN

## 2019-05-26 NOTE — Progress Notes (Signed)
Virtual Visit via Telephone Note  I connected with [name] on 05/26/19 at  8:00 AM EDT by telephone and verified that I am speaking with the correct person using two identifiers.  Location: Patient: home Provider: office   I discussed the limitations, risks, security and privacy concerns of performing an evaluation and management service by telephone and the availability of in person appointments. I also discussed with the patient that there may be a patient responsible charge related to this service. The patient expressed understanding and agreed to proceed. This visit type is conducted due to national recommendations for restrictions regarding the COVID -19 Pandemic. Due to the patient's age and / or co morbidities, this format is felt to be most appropriate at this time without adequate follow up. The patient has no access to video technology/ had technical difficulties with video, requiring transitioning to audio format  only ( telephone ). All issues noted this document were discussed and addressed,no physical exam can be performed in this format.    History of Present Illness: F/u chronic problems , review of recent  Labs Denies recent fever or chills. Denies sinus pressure, nasal congestion, ear pain or sore throat. Denies chest congestion, productive cough or wheezing. Denies chest pains, palpitations and leg swelling Denies abdominal pain, nausea, vomiting,diarrhea or constipation.   Denies dysuria, frequency, hesitancy or incontinence. Denies joint pain, swelling and limitation in mobility. Denies headaches, seizures, numbness, or tingling. Denies uncontrolled depression, anxiety or insomnia. Denies skin break down or rash.     .   Observations/Objective: BP 120/80   Ht 5\' 8"  (1.727 m)   Wt 155 lb (70.3 kg)   BMI 23.57 kg/m  Good communication with no confusion and intact memory. Alert and oriented x 3 No signs of respiratory distress during  sppech    Assessment and Plan: Essential hypertension Controlled, no change in medication DASH diet and commitment to daily physical activity for a minimum of 30 minutes discussed and encouraged, as a part of hypertension management. The importance of attaining a healthy weight is also discussed.  BP/Weight 05/26/2019 09/23/2018 03/13/2018 01/15/2018 01/01/2018 0/96/0454 0/08/8118  Systolic BP 147 829 562 130 865 - 784  Diastolic BP 80 80 86 84 80 - 89  Wt. (Lbs) 155 155.4 165 161.04 - 150 150  BMI 23.57 23.63 25.09 24.49 - 20.92 20.92       Nicotine dependence, cigarettes, with other nicotine-induced disorders Asked:confirms currently smokes cigarettes, now 10 / day Assess: Unwilling to quit but cutting back Advise: needs to QUIT to reduce risk of cancer, cardio and cerebrovascular disease Assist: counseled for 5 minutes and literature provided Arrange: follow up in 3 months   Depression with anxiety Controlled, no change in medication   GAD (generalized anxiety disorder) Controlled, no change in medication   Vitamin D deficiency Needs to supplement weekly  Alcohol addiction ([name]) Still does weekend drinking, advised to discontinue and again encouraged to join AA    Follow Up Instructions:    I discussed the assessment and treatment plan with the patient. The patient was provided an opportunity to ask questions and all were answered. The patient agreed with the plan and demonstrated an understanding of the instructions.   The patient was advised to call back or seek an in-person evaluation if the symptoms worsen or if the condition fails to improve as anticipated.  I provided 22 minutes of non-face-to-face time during this encounter.   Tula Nakayama, MD

## 2019-05-26 NOTE — Assessment & Plan Note (Addendum)
Asked:confirms currently smokes cigarettes, now 75 / day Assess: Unwilling to quit but cutting back Advise: needs to QUIT to reduce risk of cancer, cardio and cerebrovascular disease Assist: counseled for 5 minutes and literature provided Arrange: follow up in 3 months

## 2019-05-26 NOTE — Assessment & Plan Note (Signed)
Controlled, no change in medication DASH diet and commitment to daily physical activity for a minimum of 30 minutes discussed and encouraged, as a part of hypertension management. The importance of attaining a healthy weight is also discussed.  BP/Weight 05/26/2019 09/23/2018 03/13/2018 01/15/2018 01/01/2018 4/62/7035 0/0/9381  Systolic BP 829 937 169 678 938 - 101  Diastolic BP 80 80 86 84 80 - 89  Wt. (Lbs) 155 155.4 165 161.04 - 150 150  BMI 23.57 23.63 25.09 24.49 - 20.92 20.92

## 2019-05-26 NOTE — Assessment & Plan Note (Signed)
Controlled, no change in medication  

## 2019-05-26 NOTE — Patient Instructions (Addendum)
Annual physical exam with shingrix # 1 in 6 weeks with MD, call if you need me sooner  Please continue to work on cutting back cigarettes with a view to being down to max of 7 / day in the next 6 weeks PlLease do not drink  Beer, even on the weekends!, this may get you down the path of drinking too much, and you do not want that!  Labs are excellent, except vit D is low so please start once weekly vit d wihich I have prescribved.   Steps to Quit Smoking  Smoking tobacco can be bad for your health. It can also affect almost every organ in your body. Smoking puts you and people around you at risk for many serious long-lasting (chronic) diseases. Quitting smoking is hard, but it is one of the best things that you can do for your health. It is never too late to quit. What are the benefits of quitting smoking? When you quit smoking, you lower your risk for getting serious diseases and conditions. They can include:  Lung cancer or lung disease.  Heart disease.  Stroke.  Heart attack.  Not being able to have children (infertility).  Weak bones (osteoporosis) and broken bones (fractures). If you have coughing, wheezing, and shortness of breath, those symptoms may get better when you quit. You may also get sick less often. If you are pregnant, quitting smoking can help to lower your chances of having a baby of low birth weight. What can I do to help me quit smoking? Talk with your doctor about what can help you quit smoking. Some things you can do (strategies) include:  Quitting smoking totally, instead of slowly cutting back how much you smoke over a period of time.  Going to in-person counseling. You are more likely to quit if you go to many counseling sessions.  Using resources and support systems, such as: ? Database administrator with a Social worker. ? Phone quitlines. ? Careers information officer. ? Support groups or group counseling. ? Text messaging programs. ? Mobile phone apps or  applications.  Taking medicines. Some of these medicines may have nicotine in them. If you are pregnant or breastfeeding, do not take any medicines to quit smoking unless your doctor says it is okay. Talk with your doctor about counseling or other things that can help you. Talk with your doctor about using more than one strategy at the same time, such as taking medicines while you are also going to in-person counseling. This can help make quitting easier. What things can I do to make it easier to quit? Quitting smoking might feel very hard at first, but there is a lot that you can do to make it easier. Take these steps:  Talk to your family and friends. Ask them to support and encourage you.  Call phone quitlines, reach out to support groups, or work with a Social worker.  Ask people who smoke to not smoke around you.  Avoid places that make you want (trigger) to smoke, such as: ? Bars. ? Parties. ? Smoke-break areas at work.  Spend time with people who do not smoke.  Lower the stress in your life. Stress can make you want to smoke. Try these things to help your stress: ? Getting regular exercise. ? Deep-breathing exercises. ? Yoga. ? Meditating. ? Doing a body scan. To do this, close your eyes, focus on one area of your body at a time from head to toe, and notice which parts of your  body are tense. Try to relax the muscles in those areas.  Download or buy apps on your mobile phone or tablet that can help you stick to your quit plan. There are many free apps, such as QuitGuide from the [employer] developer(Centers for Disease Control and Prevention). You can find more support from smokefree.gov and other websites. This information is not intended to replace advice given to you by your health care provider. Make sure you discuss any questions you have with your health care provider. Document Released: 09/29/2009 Document Revised: 07/31/2016 Document Reviewed: 04/19/2015 Elsevier Interactive Patient  Education  2019 [employer]

## 2019-05-29 NOTE — Assessment & Plan Note (Signed)
Needs to supplement weekly

## 2019-05-29 NOTE — Assessment & Plan Note (Signed)
Still does weekend drinking, advised to discontinue and again encouraged to join AA

## 2019-07-07 ENCOUNTER — Other Ambulatory Visit: Payer: Self-pay | Admitting: Family Medicine

## 2019-07-08 ENCOUNTER — Encounter: Payer: Self-pay | Admitting: Family Medicine

## 2019-07-08 ENCOUNTER — Other Ambulatory Visit: Payer: Self-pay

## 2019-07-08 ENCOUNTER — Ambulatory Visit (INDEPENDENT_AMBULATORY_CARE_PROVIDER_SITE_OTHER): Payer: Medicare Other | Admitting: Family Medicine

## 2019-07-08 VITALS — BP 140/90 | HR 102 | Resp 16 | Ht 68.0 in | Wt 144.0 lb

## 2019-07-08 DIAGNOSIS — F418 Other specified anxiety disorders: Secondary | ICD-10-CM

## 2019-07-08 DIAGNOSIS — F10288 Alcohol dependence with other alcohol-induced disorder: Secondary | ICD-10-CM | POA: Diagnosis not present

## 2019-07-08 DIAGNOSIS — I10 Essential (primary) hypertension: Secondary | ICD-10-CM | POA: Diagnosis not present

## 2019-07-08 DIAGNOSIS — F411 Generalized anxiety disorder: Secondary | ICD-10-CM

## 2019-07-08 DIAGNOSIS — J432 Centrilobular emphysema: Secondary | ICD-10-CM

## 2019-07-08 MED ORDER — AMLODIPINE BESYLATE 5 MG PO TABS: 5.0000 mg | ORAL_TABLET | Freq: Every day | ORAL | 5 refills | Status: DC

## 2019-07-08 MED ORDER — BUSPIRONE HCL 7.5 MG PO TABS: 7.5000 mg | ORAL_TABLET | Freq: Three times a day (TID) | ORAL | 3 refills | Status: DC

## 2019-07-08 MED ORDER — IPRATROPIUM-ALBUTEROL 0.5-2.5 (3) MG/3ML IN SOLN: 3.0000 mL | Freq: Four times a day (QID) | RESPIRATORY_TRACT | 3 refills | Status: DC | PRN

## 2019-07-08 MED ORDER — CITALOPRAM HYDROBROMIDE 20 MG PO TABS: 20.0000 mg | ORAL_TABLET | Freq: Every day | ORAL | 5 refills | Status: DC

## 2019-07-08 MED ORDER — ERGOCALCIFEROL 1.25 MG (50000 UT) PO CAPS: 50000.0000 [IU] | ORAL_CAPSULE | ORAL | 3 refills | Status: DC

## 2019-07-08 MED ORDER — DULERA 200-5 MCG/ACT IN AERO: 2.0000 | INHALATION_SPRAY | Freq: Two times a day (BID) | RESPIRATORY_TRACT | 5 refills | Status: DC

## 2019-07-08 MED ORDER — ALBUTEROL SULFATE HFA 108 (90 BASE) MCG/ACT IN AERS: INHALATION_SPRAY | RESPIRATORY_TRACT | 5 refills | Status: DC

## 2019-07-08 MED ORDER — TRIAMTERENE-HCTZ 37.5-25 MG PO TABS: ORAL_TABLET | ORAL | 5 refills | Status: DC

## 2019-07-08 NOTE — Patient Instructions (Addendum)
F/U with MD early September, call if you need me before  Increase dose of anxiety pill buspar and take three times daily  You are referred to Counsellor today  No labs needed  Congrats on 4 cigarettes/ day, kleep cutting back to quitting  Please cut down weekend and any drinking, need to stop entirely, listen to music and watch [place], go for walks, and remember your Dad's wisdom

## 2019-07-08 NOTE — Assessment & Plan Note (Addendum)
Uncontrolled  Increase buspar 7.5 three times daily, referred to therapist

## 2019-07-08 NOTE — Assessment & Plan Note (Signed)

## 2019-07-08 NOTE — Progress Notes (Signed)
   [name]     MRN: [id number]      DOB: May 28, 1956   HPI: Patient is in fto f/u chronic problems Anxiety is uncontrolled and this is addressed Has resumed weekend drinking reports 12/ day, however, likely more.  Denies recent fever or chills. Denies sinus pressure, nasal congestion, ear pain or sore throat. Denies chest congestion, productive cough or wheezing. Denies chest pains, palpitations and leg swelling Denies abdominal pain, nausea, vomiting,diarrhea or constipation.   Denies dysuria, frequency, hesitancy or incontinence. Denies joint pain, swelling and limitation in mobility. Denies headaches, seizures, numbness, or tingling. Denies skin break down or rash.       PE; BP 140/90   Pulse (!) 102   Resp 16   Ht 5\' 8"  (1.727 m)   Wt 144 lb (65.3 kg)   SpO2 97%   BMI 21.90 kg/m   Pleasant male, alert and oriented x 3, in no cardio-pulmonary distress.extremely anxious and irritable Afebrile. HEENT No facial trauma or asymetry. Sinuses non tender. EOMI, t. External ears normal,   Neck: supple, no adenopathy,JVD or thyromegaly.No bruits.  Chest: Clear to ascultation bilaterally.No crackles or wheezes.Decreased air entry  Non tender to palpation  Cardiovascular system; Heart sounds normal,  S1 and  S2 ,no S3.  No murmur, or thrill. Apical beat not displaced Peripheral pulses normal.  Abdomen: Soft, non tender, no organomegaly or masses.    Musculoskeletal exam: Full ROM of spine, hips , shoulders and knees. No deformity ,swelling or crepitus noted. No muscle wasting or atrophy.   Neurologic: Cranial nerves 2 to 12 intact. Power, tone , normal throughout. No disturbance in gait. No tremor.  Skin: Intact, no ulceration, erythema , scaling or rash noted. Pigmentation normal throughout  Psych; extremely anxious and agitated, angry with family, feels trapped due to Covid 19 concentration normal   Assessment & Plan:  GAD (generalized  anxiety disorder) Uncontrolled  Increase buspar 7.5 three times daily, referred to therapist  Annual physical exam Annual exam as documented. Counseling done  re healthy lifestyle involving commitment to 150 minutes exercise per week, heart healthy diet, and attaining healthy weight.The importance of adequate sleep also discussed. Regular seat belt use and home safety, is also discussed. Changes in health habits are decided on by the patient with goals and time frames  set for achieving them. Immunization and cancer screening needs are specifically addressed at this visit.   Alcohol addiction ([place]) Has resumed alcohol use reportedly primarily on weekends however I do suspect that he uses alcohol more frequently  than this.  He is encouraged to cut back and quit as well as to count to join an AA group he is not interested in this at this time unfortunately.  Essential hypertension Elevated at visit,  However pt very agitated, as well as the fact that he reports alcohol use, no med change at this visit  Depression with anxiety Uncontrolled, not suicidal or homicidal, no change in medication at this visit  Centrilobular emphysema ([place]) Reminded of need to stop smoking to protect lungs

## 2019-07-09 ENCOUNTER — Telehealth: Payer: Self-pay

## 2019-07-09 DIAGNOSIS — F411 Generalized anxiety disorder: Secondary | ICD-10-CM

## 2019-07-09 DIAGNOSIS — F418 Other specified anxiety disorders: Secondary | ICD-10-CM

## 2019-07-09 NOTE — BH Specialist Note (Signed)
[facility] Initial Clinical Assessment  MRN: [id number] NAME: [name] Date: 07/09/19  Start time: Start Time: 1300 End time: Stop Time: 1330 Total time: Total Time in Minutes (Visit): 30 Call number: Visit Number: 1- Initial Visit  Type of Contact: Type of Contact: Video Visit Initial Contact Patient consent obtained: Patient consent obtained for Virtual Visit: Yes Reason for Visit today: Reason for Your Call/Visit Today: Video Initial Intake Assessment  Treatment History Patient recently received Inpatient Treatment: Have You Recently Been in Any Inpatient Treatment (Hospital/Detox/Crisis Center/28-Day Program)?: No  Facility/Program:  None Reported  Date of discharge:  None Reported  Patient currently being seen by therapist/psychiatrist: Do You Currently Have a Therapist/Psychiatrist?: No Patient currently receiving the following services: Patient Currently Receiving the Following Services:: Medication Management(PCP prescribes psychiatric medication.)   Psychiatric History  Past Psychiatric History/Hospitalization(s): Anxiety: Yes Bipolar Disorder: No Depression: Yes Mania: No Psychosis: No Schizophrenia: No Personality Disorder: No Hospitalization for psychiatric illness: Yes History of Electroconvulsive Shock Therapy: No Prior Suicide Attempts: Yes Decreased need for sleep: No  Euphoria: No Self Injurious behaviors Yes in 1990 - none currently Family History of mental illness: Yes Family History of substance abuse: Yes  Substance Abuse: No  DUI: Yes, in 1990  Insomnia: No  History of violence No  Physical, sexual or emotional abuse:No  Prior outpatient mental health therapy: Yes, in 1990's   Clinical Assessment:   PHQ-9 Assessments: Depression screen [facility] 2/9 07/09/2019 05/29/2019 05/26/2019  Decreased Interest 2 0 0  Down, Depressed, Hopeless 2 0 0  PHQ - 2 Score 4 0 0  Altered sleeping 1 - -  Tired, decreased energy 1 - -  Change in  appetite 0 - -  Feeling bad or failure about yourself  3 - -  Trouble concentrating 1 - -  Moving slowly or fidgety/restless 0 - -  Suicidal thoughts 0 - -  PHQ-9 Score 10 - -  Difficult doing work/chores Somewhat difficult - -     GAD-7 Assessments: GAD 7 : Generalized Anxiety Score 07/09/2019 07/08/2019 05/26/2019 08/01/2016  Nervous, Anxious, on Edge 3 3 0 3  Control/stop worrying 3 2 0 1  Worry too much - different things 2 2 0 1  Trouble relaxing 3 3 0 3  Restless 2 3 0 2  Easily annoyed or irritable 2 3 0 3  Afraid - awful might happen 3 2 0 1  Total GAD 7 Score 18 18 0 14  Anxiety Difficulty Very difficult Extremely difficult - -     Social Functioning Social maturity: Social Maturity: Responsible Social judgement: Social Judgement: Normal   Stress Current stressors: Current Stressors: (Strained relatoinship with his mother and sister.) Familial stressors: Familial Stressors: None Sleep: Sleep: Decreased, Difficulty staying asleep Appetite: Appetite: No problems Coping ability: Coping ability: Normal Patient taking medications as prescribed: Patient taking medications as prescribed: Yes    Current medications:  Outpatient Encounter Medications as of 07/09/2019  Medication Sig  . albuterol (PROAIR HFA) 108 (90 Base) MCG/ACT inhaler INHALE 2 PUFFS INTO THE LUNGS EVERY SIX HOURS AS NEEDED FOR WHEEZING.  [name] amLODipine (NORVASC) 5 MG tablet Take 1 tablet (5 mg total) by mouth daily.  [name] aspirin EC 81 MG tablet Take 81 mg by mouth daily.  . busPIRone (BUSPAR) 7.5 MG tablet Take 1 tablet (7.5 mg total) by mouth 3 (three) times daily.  . citalopram (CELEXA) 20 MG tablet Take 1 tablet (20 mg total) by mouth daily.  . ergocalciferol (  VITAMIN D2) 1.25 MG (50000 UT) capsule Take 1 capsule (50,000 Units total) by mouth once a week. One capsule once weekly  . ipratropium-albuterol (DUONEB) 0.5-2.5 (3) MG/3ML SOLN Take 3 mLs by nebulization every 6 (six) hours as needed (for  wheezing/shortness of breath).  [name]. ipratropium-albuterol (DUONEB) 0.5-2.5 (3) MG/3ML SOLN INHALE 1 VIAL VIA NEBULIZER EVERY SIX HOURS AS NEEDED.  [name]. ipratropium-albuterol (DUONEB) 0.5-2.5 (3) MG/3ML SOLN Take 3 mLs by nebulization every 6 (six) hours as needed for up to 5 days (for wheezing/shortness of breath).  . mometasone-formoterol (DULERA) 200-5 MCG/ACT AERO Inhale 2 puffs into the lungs 2 (two) times daily.  [name]. triamterene-hydrochlorothiazide (MAXZIDE-25) 37.5-25 MG tablet TAKE (1) TABLET BY MOUTH ONCE DAILY.   No facility-administered encounter medications on file as of 07/09/2019.     Self-harm Behaviors Risk Assessment Self-harm risk factors: Self-harm risk factors: (None Reported) Patient endorses recent thoughts of harming self: Have you recently had any thoughts about harming yourself?: No   Danger to Others Risk Assessment Danger to others risk factors: Danger to Others Risk Factors: No risk factors noted Patient endorses recent thoughts of harming others: Notification required: No need or identified person    Substance Use Assessment Patient recently consumed alcohol:  None Reported  Patient recently used drugs:  None Reported     Goals, Interventions and Follow-up Plan Goals: Increase healthy adjustment to current life circumstances Interventions: Motivational Interviewing, Behavioral Activation and Supportive Counseling Follow-up Plan: [facility] Phone FOllow UP   Summary of Clinical Assessment Summary:   Patient is 63 year old male.  [facility] Dr. Lodema HongSimpson.  The patient was assessed in the PCP office on the video skype.    Stressors:  Patient's reports overwhelming anxiety due to being in the home with his 61 year old mother and his 61 year old sister.  Patient reports that he moved home in 2009 after his father died to help take car of his mother.   Patient reports that his father was the only person that understood him.    Patient reports  that he was very close to his father. Patient has few friends [place]  Patient reports that he does not have a lot of contact with others.   Patient has never been married and he does not have any children.   Patient is retired and is not able to leave the home due to COVID-10 virus.  Patient reports that due to his extreme asthma he is at a higher rate of contracting the virus.   Patient has a strained relationship with his mother and sister.  Patient reports that he feels as if his mother is in the early stages of dementia and therefore, she becomes forgetful and argues with him a lot.  Patient reports that he argues a lot with his sister.  Patient reports that his sister is always mean to her.      Social:  Patient reports that he is semi-retired.  Patient reports that he worked 21 years at a [employer] and 12 years at a [employer].  In 2018, he was placed on disability due to his respiratory issues.  Patient reports that he has one older brother and a younger sister.  Patient reports that he is not close to either one of his siblings.    Prior reports a prior DWI in 1990.  Patient reports that he was a heavy drinker in the 1990's.  Patient denies any  substance abuse issues presently. Patient reports that he cut himself in a prior suicide attempt in 1990 that resulted in [facility].  Patient reports going [facility] outpatient [facility] [place] in [place].   Patient reports a family history of substance abuse and mental illness.   Patient reports that he likes to work with wood and create models.  Patient reports that her likes to be outside    Medication:  Patient reports that he takes citalopram (Celexa) 20mg  daily.  Patient ran out of his psychiatric medication 4 days ago.    Patient denies SI/HI/Psychosis/Substance Abuse. If your symptoms worsen or you have thoughts of suicide/homicide, PLEASE  SEEK IMMEDIATE MEDICAL ATTENTION.  You may always call:  National Suicide Hotline: (937)692-4101(530) 594-1171;  Crisis Line: (717)317-1613831-303-1441; Crisis Recovery in [place]: 231-476-2356718-322-3872.  These are available 24 hours a day, 7 days a week.   During the next session:   When patient thinks that his sister or mother is trying to argue with him he will walk away.  Patient will take his psychiatric medication as directed by his PCP.  Patient will engage in self-care activities.       Phillip HealStevenson, Vaida Kerchner [place], LCAS-A

## 2019-07-11 ENCOUNTER — Encounter: Payer: Self-pay | Admitting: Family Medicine

## 2019-07-11 NOTE — Assessment & Plan Note (Signed)
Reminded of need to stop smoking to protect lungs

## 2019-07-11 NOTE — Assessment & Plan Note (Signed)
Has resumed alcohol use reportedly primarily on weekends however I do suspect that he uses alcohol more frequently  than this.  He is encouraged to cut back and quit as well as to count to join an AA group he is not interested in this at this time unfortunately.

## 2019-07-11 NOTE — Assessment & Plan Note (Signed)
Uncontrolled, not suicidal or homicidal, no change in medication at this visit

## 2019-07-11 NOTE — Assessment & Plan Note (Signed)
Elevated at visit,  However pt very agitated, as well as the fact that he reports alcohol use, no med change at this visit

## 2019-07-15 NOTE — Progress Notes (Signed)
[facility] Psychiatric Consultant Case Review   [name] is a 63 y.o. year old male with a history of depression, alcohol use disorder, COPD, hypertension. Worsening in depression in the context of COVID 19 pandemic (unable to go outside) and taking care of her mother, age 64 after his father died. Never married, no children, on disability due to COPD. He ran out of citalopram, although he is willing to reinitiate it. Buspar is uptitrated by PCP.   Alcohol use - he denies any use to [facility] specialist intake (some discrepancy in what is documented in [place]) Psychiatry admission- at [facility] in 1990 after he cut himself as a suicide attempt.   Assessment/Provisional Diagnosis # MDD Will continue current medication at this time given he has been non adherent without known side effect.   Recommendation - Continue citalopram 20 mg daily - Continue buspar 7.5 mg TID  - BH specialist to provide psychoeducation of medication adherence, coach self compassion  -Will continue to monitor alcohol use  Thank you for your consult. We will continue to follow the patient. Please contact [place]  for any questions or concerns.   The above treatment considerations and suggestions are based on consultation with the [facility] specialist and/or PCP and a review of information available in the shared registry and the patient's [place] Record (EHR). I have not personally examined the patient. All recommendations should be implemented with consideration of the patient's relevant prior history and current clinical status. Please feel free to call me with any questions about the care of this patient.

## 2019-07-20 ENCOUNTER — Encounter: Payer: Self-pay | Admitting: Internal Medicine

## 2019-07-20 ENCOUNTER — Telehealth: Payer: Self-pay | Admitting: Family Medicine

## 2019-07-20 NOTE — Telephone Encounter (Signed)
LM for the patient to call us to schedule an appointment for welcome to medicare

## 2019-07-20 NOTE — Telephone Encounter (Signed)
-----   Message from Fayrene Helper, MD sent at 07/11/2019  6:51 PM EDT ----- Regarding: needs welcome to medicare visit scheduled please, thanks

## 2019-07-28 ENCOUNTER — Ambulatory Visit: Payer: Medicare Other | Admitting: Family Medicine

## 2019-07-29 ENCOUNTER — Encounter: Payer: Self-pay | Admitting: Family Medicine

## 2019-07-29 ENCOUNTER — Ambulatory Visit (INDEPENDENT_AMBULATORY_CARE_PROVIDER_SITE_OTHER): Payer: Medicare Other | Admitting: Family Medicine

## 2019-07-29 ENCOUNTER — Other Ambulatory Visit: Payer: Self-pay

## 2019-07-29 VITALS — BP 156/100 | HR 77 | Temp 98.9°F | Resp 12 | Ht 71.0 in | Wt 149.0 lb

## 2019-07-29 DIAGNOSIS — Z0000 Encounter for general adult medical examination without abnormal findings: Secondary | ICD-10-CM | POA: Diagnosis not present

## 2019-07-29 LAB — POCT URINALYSIS DIP (CLINITEK)
Bilirubin, UA: NEGATIVE
Blood, UA: NEGATIVE
Glucose, UA: NEGATIVE mg/dL
Ketones, POC UA: NEGATIVE mg/dL
Leukocytes, UA: NEGATIVE
Nitrite, UA: NEGATIVE
POC PROTEIN,UA: NEGATIVE
Spec Grav, UA: 1.015 (ref 1.010–1.025)
Urobilinogen, UA: 0.2 E.U./dL
pH, UA: 6.5 (ref 5.0–8.0)

## 2019-07-29 NOTE — Progress Notes (Signed)
Subjective:   [name] is a 63 y.o. male who presents for a Welcome to Medicare exam.   Review of Systems:   Cardiac Risk Factors include: advanced age (>4755men, 4>65 women);hypertension;smoking/ tobacco exposure     Objective:    Today's Vitals   07/29/19 0936 07/29/19 0948  BP: (!) 157/100   Pulse: 77   Resp: 12   Temp: 98.9 F (37.2 C)   TempSrc: Oral   SpO2: 96%   Weight: 149 lb (67.6 kg)   Height: 5\' 11"  (1.803 m)   PainSc: 0-No pain 0-No pain   Body mass index is 20.78 kg/m.  Medications Outpatient Encounter Medications as of 07/29/2019  Medication Sig  . albuterol (PROAIR HFA) 108 (90 Base) MCG/ACT inhaler INHALE 2 PUFFS INTO THE LUNGS EVERY SIX HOURS AS NEEDED FOR WHEEZING.  [name]. aspirin EC 81 MG tablet Take 81 mg by mouth daily.  . busPIRone (BUSPAR) 5 MG tablet Take 5 mg by mouth 3 (three) times daily.  . ergocalciferol (VITAMIN D2) 1.25 MG (50000 UT) capsule Take 1 capsule (50,000 Units total) by mouth once a week. One capsule once weekly  . ipratropium-albuterol (DUONEB) 0.5-2.5 (3) MG/3ML SOLN INHALE 1 VIAL VIA NEBULIZER EVERY SIX HOURS AS NEEDED.  [name]. mometasone-formoterol (DULERA) 200-5 MCG/ACT AERO Inhale 2 puffs into the lungs 2 (two) times daily.  [name]. triamterene-hydrochlorothiazide (MAXZIDE-25) 37.5-25 MG tablet TAKE (1) TABLET BY MOUTH ONCE DAILY.  . [DISCONTINUED] amLODipine (NORVASC) 5 MG tablet Take 1 tablet (5 mg total) by mouth daily. (Patient not taking: Reported on 07/29/2019)  . [DISCONTINUED] busPIRone (BUSPAR) 7.5 MG tablet Take 1 tablet (7.5 mg total) by mouth 3 (three) times daily. (Patient not taking: Reported on 07/29/2019)  . [DISCONTINUED] citalopram (CELEXA) 20 MG tablet Take 1 tablet (20 mg total) by mouth daily.  . [DISCONTINUED] ipratropium-albuterol (DUONEB) 0.5-2.5 (3) MG/3ML SOLN Take 3 mLs by nebulization every 6 (six) hours as needed (for wheezing/shortness of breath).  . [DISCONTINUED] ipratropium-albuterol (DUONEB) 0.5-2.5 (3)  MG/3ML SOLN INHALE 1 VIAL VIA NEBULIZER EVERY SIX HOURS AS NEEDED FOR WHEEZING/SHORTNESS OF BREATH FOR UP TO 5 DAYS. (Patient not taking: Reported on 07/29/2019)  . [DISCONTINUED] ipratropium-albuterol (DUONEB) 0.5-2.5 (3) MG/3ML SOLN Take 3 mLs by nebulization every 6 (six) hours as needed for up to 5 days (for wheezing/shortness of breath).   No facility-administered encounter medications on file as of 07/29/2019.      History: Past Medical History:  Diagnosis Date  . Alcohol abuse   . Asthma   . COPD (chronic obstructive pulmonary disease) (HCC)   . Coronary atherosclerosis    Based on chest CT 2012  . Essential hypertension, benign   . History of neutropenia    Also lymphocytosis - followed by Dr. Zigmund DanielFormanek  . Nicotine addiction    Past Surgical History:  Procedure Laterality Date  . MULTIPLE TOOTH EXTRACTIONS  June 2012    Family History  Problem Relation Age of Onset  . Diabetes Mother   . Hypertension Mother   . Diabetes Father   . Hypertension Sister    Social History   Occupational History  . Occupation: unemployed  Tobacco Use  . Smoking status: Current Every Day Smoker    Packs/day: 0.25    Years: 38.00    Pack years: 9.50    Types: Cigarettes    Last attempt to quit: 12/02/2017    Years since quitting: 1.6  . Smokeless tobacco: Never Used  Substance and Sexual Activity  .  Alcohol use: No    Alcohol/week: 18.0 standard drinks    Types: 18 Cans of beer per week    Frequency: Never    Comment: quit before Christmas 2018  . Drug use: No  . Sexual activity: Never   Tobacco Counseling Ready to quit: No Counseling given: Yes   Immunizations and Health Maintenance Immunization History  Administered Date(s) Administered  . Influenza,inj,Quad PF,6+ Mos 10/15/2013, 09/27/2014, 09/21/2015, 09/23/2018  . Influenza-Unspecified 10/08/2017  . Pneumococcal Conjugate-13 06/22/2014  . Pneumococcal Polysaccharide-23 12/04/2012  . Tdap 05/09/2011   Health  Maintenance Due  Topic Date Due  . INFLUENZA VACCINE  07/18/2019    Activities of Daily Living In your present state of health, do you have any difficulty performing the following activities: 07/29/2019  Hearing? N  Vision? N  Difficulty concentrating or making decisions? N  Walking or climbing stairs? N  Dressing or bathing? N  Doing errands, shopping? N  Preparing Food and eating ? N  Using the Toilet? N  In the past six months, have you accidently leaked urine? N  Do you have problems with loss of bowel control? N  Managing your Medications? N  Managing your Finances? N  Housekeeping or managing your Housekeeping? N  Some recent data might be hidden    Physical Exam  (optional), or other factors deemed appropriate based on the beneficiary's medical and social history and current clinical standards.  Physical Exam Vitals signs and nursing note reviewed.  Constitutional:      Appearance: Normal appearance. He is well-developed, well-groomed and normal weight.  HENT:     Head: Normocephalic and atraumatic.     Right Ear: External ear normal.     Left Ear: External ear normal.     Nose: Nose normal.     Mouth/Throat:     Mouth: Mucous membranes are moist.     Pharynx: Oropharynx is clear.  Eyes:     General:        Right eye: No discharge.        Left eye: No discharge.     Conjunctiva/sclera: Conjunctivae normal.  Neck:     Musculoskeletal: Normal range of motion and neck supple.  Cardiovascular:     Rate and Rhythm: Normal rate and regular rhythm.     Pulses: Normal pulses.     Heart sounds: Normal heart sounds.  Pulmonary:     Effort: Pulmonary effort is normal.     Breath sounds: Normal breath sounds.  Musculoskeletal: Normal range of motion.  Skin:    General: Skin is warm.  Neurological:     General: No focal deficit present.     Mental Status: He is alert and oriented to person, place, and time.  Psychiatric:        Attention and Perception: Attention  normal.        Mood and Affect: Mood normal.        Speech: Speech normal.        Behavior: Behavior normal. Behavior is cooperative.        Thought Content: Thought content normal.        Cognition and Memory: Cognition normal.        Judgment: Judgment normal.     Advanced Directives: Family knows wishes, no paperwork      Assessment:    This is a routine wellness  examination for this patient .    Vision/Hearing screen  Hearing Screening   125Hz  250Hz  500Hz  1000Hz  2000Hz  3000Hz   4000Hz  6000Hz  8000Hz   Right ear:           Left ear:             Visual Acuity Screening   Right eye Left eye Both eyes  Without correction: 20/25 20/20 20/20   With correction:       Dietary issues and exercise activities discussed:  Current Exercise Habits: Home exercise routine, Type of exercise: walking, Time (Minutes): > 60, Frequency (Times/Week): 7, Weekly Exercise (Minutes/Week): 0, Intensity: Mild, Exercise limited by: None identified  Goals    . Quit smoking / using tobacco       Depression Screen PHQ 2/9 Scores 07/29/2019 07/09/2019 05/29/2019 05/26/2019  PHQ - 2 Score 1 4 0 0  PHQ- 9 Score - 10 - -     Fall Risk Fall Risk  07/29/2019  Falls in the past year? 0  Number falls in past yr: -  Injury with Fall? 0    Cognitive Function     6CIT Screen 07/29/2019  What Year? 0 points  What month? 0 points  What time? 0 points  Count back from 20 0 points  Months in reverse 2 points  Repeat phrase 0 points  Total Score 2    Patient Care Team: Kerri PerchesSimpson, Margaret E, MD as PCP - General     Plan:      1. Encounter for Medicare annual wellness exam  -EKG - POCT URINALYSIS DIP (CLINITEK)  I have personally reviewed and noted the following in the patient's chart:   . Medical and social history . Use of alcohol, tobacco or illicit drugs  . Current medications and supplements . Functional ability and status . Nutritional status . Physical activity . Advanced directives .  List of other physicians . Hospitalizations, surgeries, and ER visits in previous 12 months . Vitals . Screenings to include cognitive, depression, and falls . Referrals and appointments  In addition, I have reviewed and discussed with patient certain preventive protocols, quality metrics, and best practice recommendations. A written personalized care plan for preventive services as well as general preventive health recommendations were provided to patient.    Freddy FinnerHannah M Bristol Soy, NP 07/29/2019

## 2019-07-29 NOTE — Patient Instructions (Addendum)
[name] , Thank you for taking time to come for your Medicare Wellness Visit. I appreciate your ongoing commitment to your health goals. Please review the following plan we discussed and let me know if I can assist you in the future.   Please continue to practice social distancing to keep you, your family, and our community safe.  If you must go out, please wear a Mask and practice good handwashing.  Screening recommendations/referrals: Colonoscopy: Due 2020 Recommended yearly ophthalmology/optometry visit for glaucoma screening and checkup Recommended yearly dental visit for hygiene and checkup  Vaccinations: Influenza vaccine: Due Fall 2020 Pneumococcal vaccine: Completed Tdap vaccine: Due 2022 Shingles vaccine: Check insurance coverage  Advanced directives: Family knows wishes  Conditions/risks identified: FALLS  Next appointment: 08/27/2019   Preventive Care 40-64 Years, Male Preventive care refers to lifestyle choices and visits with your health care provider that can promote health and wellness. What does preventive care include?  A yearly physical exam. This is also called an annual well check.  Dental exams once or twice a year.  Routine eye exams. Ask your health care provider how often you should have your eyes checked.  Personal lifestyle choices, including:  Daily care of your teeth and gums.  Regular physical activity.  Eating a healthy diet.  Avoiding tobacco and drug use.  Limiting alcohol use.  Practicing safe sex.  Taking low-dose aspirin every day starting at age 61. What happens during an annual well check? The services and screenings done by your health care provider during your annual well check will depend on your age, overall health, lifestyle risk factors, and family history of disease. Counseling  Your health care provider may ask you questions about your:  Alcohol use.  Tobacco use.  Drug use.  Emotional well-being.  Home and  relationship well-being.  Sexual activity.  Eating habits.  Work and work Astronomerenvironment. Screening  You may have the following tests or measurements:  Height, weight, and BMI.  Blood pressure.  Lipid and cholesterol levels. These may be checked every 5 years, or more frequently if you are over 61 years old.  Skin check.  Lung cancer screening. You may have this screening every year starting at age 61 if you have a 30-pack-year history of smoking and currently smoke or have quit within the past 15 years.  Fecal occult blood test (FOBT) of the stool. You may have this test every year starting at age 61.  Flexible sigmoidoscopy or colonoscopy. You may have a sigmoidoscopy every 5 years or a colonoscopy every 10 years starting at age 61.  Prostate cancer screening. Recommendations will vary depending on your family history and other risks.  Hepatitis C blood test.  Hepatitis B blood test.  Sexually transmitted disease (STD) testing.  Diabetes screening. This is done by checking your blood sugar (glucose) after you have not eaten for a while (fasting). You may have this done every 1-3 years. Discuss your test results, treatment options, and if necessary, the need for more tests with your health care provider. Vaccines  Your health care provider may recommend certain vaccines, such as:  Influenza vaccine. This is recommended every year.  Tetanus, diphtheria, and acellular pertussis (Tdap, Td) vaccine. You may need a Td booster every 10 years.  Zoster vaccine. You may need this after age 61.  Pneumococcal 13-valent conjugate (PCV13) vaccine. You may need this if you have certain conditions and have not been vaccinated.  Pneumococcal polysaccharide (PPSV23) vaccine. You may need one or  two doses if you smoke cigarettes or if you have certain conditions. Talk to your health care provider about which screenings and vaccines you need and how often you need them. This information is  not intended to replace advice given to you by your health care provider. Make sure you discuss any questions you have with your health care provider. Document Released: 12/30/2015 Document Revised: 08/22/2016 Document Reviewed: 10/04/2015 Elsevier Interactive Patient Education  2017 [employer]  Fall Prevention in the Home Falls can cause injuries. They can happen to people of all ages. There are many things you can do to make your home safe and to help prevent falls. What can I do on the outside of my home?  Regularly fix the edges of walkways and driveways and fix any cracks.  Remove anything that might make you trip as you walk through a door, such as a raised step or threshold.  Trim any bushes or trees on the path to your home.  Use bright outdoor lighting.  Clear any walking paths of anything that might make someone trip, such as rocks or tools.  Regularly check to see if handrails are loose or broken. Make sure that both sides of any steps have handrails.  Any raised decks and porches should have guardrails on the edges.  Have any leaves, snow, or ice cleared regularly.  Use sand or salt on walking paths during winter.  Clean up any spills in your garage right away. This includes oil or grease spills. What can I do in the bathroom?  Use night lights.  Install grab bars by the toilet and in the tub and shower. Do not use towel bars as grab bars.  Use non-skid mats or decals in the tub or shower.  If you need to sit down in the shower, use a plastic, non-slip stool.  Keep the floor dry. Clean up any water that spills on the floor as soon as it happens.  Remove soap buildup in the tub or shower regularly.  Attach bath mats securely with double-sided non-slip rug tape.  Do not have throw rugs and other things on the floor that can make you trip. What can I do in the bedroom?  Use night lights.  Make sure that you have a light by your bed that is easy to reach.   Do not use any sheets or blankets that are too big for your bed. They should not hang down onto the floor.  Have a firm chair that has side arms. You can use this for support while you get dressed.  Do not have throw rugs and other things on the floor that can make you trip. What can I do in the kitchen?  Clean up any spills right away.  Avoid walking on wet floors.  Keep items that you use a lot in easy-to-reach places.  If you need to reach something above you, use a strong step stool that has a grab bar.  Keep electrical cords out of the way.  Do not use floor polish or wax that makes floors slippery. If you must use wax, use non-skid floor wax.  Do not have throw rugs and other things on the floor that can make you trip. What can I do with my stairs?  Do not leave any items on the stairs.  Make sure that there are handrails on both sides of the stairs and use them. Fix handrails that are broken or loose. Make sure that handrails are  as long as the stairways.  Check any carpeting to make sure that it is firmly attached to the stairs. Fix any carpet that is loose or worn.  Avoid having throw rugs at the top or bottom of the stairs. If you do have throw rugs, attach them to the floor with carpet tape.  Make sure that you have a light switch at the top of the stairs and the bottom of the stairs. If you do not have them, ask someone to add them for you. What else can I do to help prevent falls?  Wear shoes that:  Do not have high heels.  Have rubber bottoms.  Are comfortable and fit you well.  Are closed at the toe. Do not wear sandals.  If you use a stepladder:  Make sure that it is fully opened. Do not climb a closed stepladder.  Make sure that both sides of the stepladder are locked into place.  Ask someone to hold it for you, if possible.  Clearly mark and make sure that you can see:  Any grab bars or handrails.  First and last steps.  Where the edge of  each step is.  Use tools that help you move around (mobility aids) if they are needed. These include:  Canes.  Walkers.  Scooters.  Crutches.  Turn on the lights when you go into a dark area. Replace any light bulbs as soon as they burn out.  Set up your furniture so you have a clear path. Avoid moving your furniture around.  If any of your floors are uneven, fix them.  If there are any pets around you, be aware of where they are.  Review your medicines with your doctor. Some medicines can make you feel dizzy. This can increase your chance of falling. Ask your doctor what other things that you can do to help prevent falls. This information is not intended to replace advice given to you by your health care provider. Make sure you discuss any questions you have with your health care provider. Document Released: 09/29/2009 Document Revised: 05/10/2016 Document Reviewed: 01/07/2015 Elsevier Interactive Patient Education  2017 [employer].

## 2019-08-05 ENCOUNTER — Telehealth: Payer: Self-pay

## 2019-08-05 DIAGNOSIS — F411 Generalized anxiety disorder: Secondary | ICD-10-CM

## 2019-08-05 DIAGNOSIS — F418 Other specified anxiety disorders: Secondary | ICD-10-CM

## 2019-08-05 NOTE — BH Specialist Note (Signed)
[facility] Telephone Follow-up  MRN: [id number] NAME: [name] Date: 08/05/19  Start time: Start Time: 1000(1000) End time: Stop Time: 1030 Total time: Total Time in Minutes (Visit): 30 Call number:    Reason for call today: Reason for Contact: PHQ9-2 weeks  PHQ-9 Scores:  [facility] 08/05/2019 07/29/2019 07/09/2019 05/29/2019 05/26/2019  Decreased Interest 1 0 2 0 0  Down, Depressed, Hopeless 0 1 2 0 0  PHQ - 2 Score 1 1 4  0 0  Altered sleeping 0 - 1 - -  Tired, decreased energy 0 - 1 - -  Change in appetite 0 - 0 - -  Feeling bad or failure about yourself  0 - 3 - -  Trouble concentrating 0 - 1 - -  Moving slowly or fidgety/restless 0 - 0 - -  Suicidal thoughts 0 - 0 - -  PHQ-9 Score 1 - 10 - -  Difficult doing work/chores Not difficult at all - Somewhat difficult - -    GAD-7 Scores:  GAD 7 : Generalized Anxiety Score 08/05/2019 07/09/2019 07/08/2019 05/26/2019  Nervous, Anxious, on Edge 1 3 3  0  Control/stop worrying 1 3 2  0  Worry too much - different things 0 2 2 0  Trouble relaxing 0 3 3 0  Restless 0 2 3 0  Easily annoyed or irritable 2 2 3  0  Afraid - awful might happen 0 3 2 0  Total GAD 7 Score 4 18 18  0  Anxiety Difficulty Not difficult at all Very difficult Extremely difficult -    Stress Current stressors: Current Stressors: (Patient has stopped taking his psychiatric medication.) Sleep: Sleep: No problems Appetite: Appetite: No problems Coping ability: Coping ability: Normal Patient taking medications as prescribed: Patient taking medications as prescribed: No  Current medications:  Outpatient Encounter Medications as of 08/05/2019  Medication Sig  . albuterol (PROAIR HFA) 108 (90 Base) MCG/ACT inhaler INHALE 2 PUFFS INTO THE LUNGS EVERY SIX HOURS AS NEEDED FOR WHEEZING.  [name]. aspirin EC 81 MG tablet Take 81 mg by mouth daily.  . busPIRone (BUSPAR) 5 MG tablet Take 5 mg by mouth 3 (three) times daily.  . ergocalciferol (VITAMIN  D2) 1.25 MG (50000 UT) capsule Take 1 capsule (50,000 Units total) by mouth once a week. One capsule once weekly  . ipratropium-albuterol (DUONEB) 0.5-2.5 (3) MG/3ML SOLN INHALE 1 VIAL VIA NEBULIZER EVERY SIX HOURS AS NEEDED.  [name]. mometasone-formoterol (DULERA) 200-5 MCG/ACT AERO Inhale 2 puffs into the lungs 2 (two) times daily.  [name]. triamterene-hydrochlorothiazide (MAXZIDE-25) 37.5-25 MG tablet TAKE (1) TABLET BY MOUTH ONCE DAILY.   No facility-administered encounter medications on file as of 08/05/2019.      Self-harm Behaviors Risk Assessment Self-harm risk factors: Self-harm risk factors: (None Reported) Patient endorses recent thoughts of harming self: Have you recently had any thoughts about harming yourself?: No    Danger to Others Risk Assessment Danger to others risk factors: Danger to Others Risk Factors: No risk factors noted Patient endorses recent thoughts of harming others: Notification required: No need or identified person     Substance Use Assessment Patient recently consumed alcohol:  None Reported Patient recently used drugs:  None Reported   Goals, Interventions and Follow-up Plan Goals: Increase healthy adjustment to current life circumstances and Improve medication compliance Interventions: Motivational Interviewing Follow-up Plan: [facility] Phone Follow UP   Summary:   Patient is a 63 year old male.  Patient reports that his PHQ and GAD score has  decreased since his last appt with his PCP on 07-29-2019.   Patient reports that he did not purchase his psychiatric medication because, "his medication cost too much".    Patient reports he was not receptive to counseling.  Patient reports that he was able to handle his own personal issues independently.   Patient reports that he did inform his PCP that he did not purchase his psychiatric medication on 07-29-2019.    Writer informed the patient that I would reach out to the PCP to see if sample could be provided to him.  Patient reports that he was busy and was not able to talk anymore today.     Medication:  Patient reports that he was taking citalopram (Celexa) 20mg  daily.  Patient has not been compliant with taking his psychiatric medication.     Patient denies SI/HI/Psychosis/Substance Abuse. If your symptoms worsen or you have thoughts of suicide/homicide, PLEASE SEEK IMMEDIATE MEDICAL ATTENTION.  You may always call:  National Suicide Hotline: 352-769-7437; [place] Crisis Line: 364-593-9403; Crisis Recovery in [place]: 256-287-2274.  These are available 24 hours a day, 7 days a week.   During the next session:   Writer will route this information to the PCP.       Graciella Freer [place], LCAS-A

## 2019-08-05 NOTE — Progress Notes (Signed)
The patient declined [place] service, and is not adherent to psychotropic (He attributed this to financial strain). Will sign off at this time. Please contact us if any questions or concerns.

## 2019-08-10 ENCOUNTER — Ambulatory Visit: Payer: Medicare Other

## 2019-08-10 ENCOUNTER — Telehealth: Payer: Self-pay | Admitting: *Deleted

## 2019-08-10 ENCOUNTER — Other Ambulatory Visit: Payer: Self-pay

## 2019-08-10 DIAGNOSIS — Z23 Encounter for immunization: Secondary | ICD-10-CM | POA: Diagnosis not present

## 2019-08-10 MED ORDER — IPRATROPIUM-ALBUTEROL 0.5-2.5 (3) MG/3ML IN SOLN: RESPIRATORY_TRACT | 5 refills | Status: DC

## 2019-08-10 NOTE — Telephone Encounter (Signed)
duoneb sent in

## 2019-08-10 NOTE — Telephone Encounter (Signed)
Pt called he needs his nebulizer sent to [facility] he only has two left!

## 2019-08-27 ENCOUNTER — Other Ambulatory Visit: Payer: Self-pay

## 2019-08-27 ENCOUNTER — Encounter: Payer: Self-pay | Admitting: Family Medicine

## 2019-08-27 ENCOUNTER — Ambulatory Visit (INDEPENDENT_AMBULATORY_CARE_PROVIDER_SITE_OTHER): Payer: Medicare Other | Admitting: Family Medicine

## 2019-08-27 VITALS — BP 156/100 | Ht 71.0 in | Wt 149.0 lb

## 2019-08-27 DIAGNOSIS — F10288 Alcohol dependence with other alcohol-induced disorder: Secondary | ICD-10-CM | POA: Diagnosis not present

## 2019-08-27 DIAGNOSIS — F418 Other specified anxiety disorders: Secondary | ICD-10-CM

## 2019-08-27 DIAGNOSIS — I10 Essential (primary) hypertension: Secondary | ICD-10-CM

## 2019-08-27 DIAGNOSIS — Z23 Encounter for immunization: Secondary | ICD-10-CM

## 2019-08-27 DIAGNOSIS — F17218 Nicotine dependence, cigarettes, with other nicotine-induced disorders: Secondary | ICD-10-CM | POA: Diagnosis not present

## 2019-08-27 MED ORDER — DULERA 200-5 MCG/ACT IN AERO: 2.0000 | INHALATION_SPRAY | Freq: Two times a day (BID) | RESPIRATORY_TRACT | 3 refills | Status: DC

## 2019-08-27 MED ORDER — BUSPIRONE HCL 5 MG PO TABS: 5.0000 mg | ORAL_TABLET | Freq: Three times a day (TID) | ORAL | 3 refills | Status: DC

## 2019-08-27 MED ORDER — ALBUTEROL SULFATE HFA 108 (90 BASE) MCG/ACT IN AERS: INHALATION_SPRAY | RESPIRATORY_TRACT | 3 refills | Status: DC

## 2019-08-27 MED ORDER — IPRATROPIUM-ALBUTEROL 0.5-2.5 (3) MG/3ML IN SOLN: RESPIRATORY_TRACT | 3 refills | Status: DC

## 2019-08-27 MED ORDER — ERGOCALCIFEROL 1.25 MG (50000 UT) PO CAPS: 50000.0000 [IU] | ORAL_CAPSULE | ORAL | 3 refills | Status: DC

## 2019-08-27 MED ORDER — TRIAMTERENE-HCTZ 37.5-25 MG PO TABS: ORAL_TABLET | ORAL | 3 refills | Status: DC

## 2019-08-27 NOTE — Patient Instructions (Signed)
F/u in 5 months, call if you need me before  Please commit to stopping smoking  entirely and also no alcohol use.  Nurse will be in touch with you re help with medication as well as how to follow up on your insurance card, I hope this is sorted out soon

## 2019-08-27 NOTE — Assessment & Plan Note (Addendum)
counselled to stop alcohol, reports not currently using

## 2019-08-27 NOTE — Progress Notes (Signed)
Virtual Visit via Telephone Note  I connected with [name] on 08/27/19 at  8:00 AM EDT by telephone and verified that I am speaking with the correct person using two identifiers.  Location: Patient: home Provider: office  I discussed the limitations, risks, security and privacy concerns of performing an evaluation and management service by telephone and the availability of in person appointments. I also discussed with the patient that there may be a patient responsible charge related to this service. The patient expressed understanding and agreed to proceed.   History of Present Illness: F/u chronic problem states he has no insurance coverage and cannot afford prescription meds, states he is taking all medication prescribed but cost is over $100, stressed over this Denies recent fever or chills. Denies sinus pressure, nasal congestion, ear pain or sore throat. Denies chest congestion, productive cough or wheezing. Denies chest pains, palpitations and leg swelling Denies abdominal pain, nausea, vomiting,diarrhea or constipation.   Denies dysuria, frequency, hesitancy or incontinence. Denies joint pain, swelling and limitation in mobility. Denies headaches, seizures, numbness, or tingling. iaDenies skin break down or rash.       Observations/Objective: BP (!) 156/100   Ht 5\' 11"  (1.803 m)   Wt 149 lb (67.6 kg)   BMI 20.78 kg/m  record review Good communication with no confusion and intact memory. Alert and oriented x 3 No signs of respiratory distress during speech       Assessment and Plan:  Essential hypertension Uncontrolled  Needs to cfommit to taking medication regularly , and d/c alcohol and nicotine  Depression with anxiety Staes he is oK despite significant increase in stress  Alcohol addiction ([place]) counselled to stop alcohol, reports not currently using  Nicotine dependence, cigarettes, with other nicotine-induced disorders Asked:confirms currently  smokes cigarettes Assess: Unwilling to quit but cutting back Advise: needs to QUIT to reduce risk of cancer, cardio and cerebrovascular disease Assist: counseled for 5 minutes and literature provided Arrange: follow up in 3 months   Follow Up Instructions:    I discussed the assessment and treatment plan with the patient. The patient was provided an opportunity to ask questions and all were answered. The patient agreed with the plan and demonstrated an understanding of the instructions.   The patient was advised to call back or seek an in-person evaluation if the symptoms worsen or if the condition fails to improve as anticipated.  I provided 10 minutes of non-face-to-face time during this encounter.   Tula Nakayama, MD

## 2019-08-27 NOTE — Assessment & Plan Note (Signed)
Uncontrolled  Needs to cfommit to taking medication regularly , and d/c alcohol and nicotine

## 2019-08-27 NOTE — Assessment & Plan Note (Signed)
Staes he is oK despite significant increase in stress

## 2019-08-27 NOTE — Assessment & Plan Note (Signed)
Asked:confirms currently smokes cigarettes Assess: Unwilling to quit but cutting back Advise: needs to QUIT to reduce risk of cancer, cardio and cerebrovascular disease Assist: counseled for 5 minutes and literature provided Arrange: follow up in 3 months  

## 2019-09-04 ENCOUNTER — Other Ambulatory Visit: Payer: Self-pay | Admitting: *Deleted

## 2019-09-04 ENCOUNTER — Ambulatory Visit (INDEPENDENT_AMBULATORY_CARE_PROVIDER_SITE_OTHER): Payer: Medicare Other | Admitting: Family Medicine

## 2019-09-04 ENCOUNTER — Encounter: Payer: Self-pay | Admitting: Family Medicine

## 2019-09-04 ENCOUNTER — Encounter: Payer: Self-pay | Admitting: *Deleted

## 2019-09-04 ENCOUNTER — Other Ambulatory Visit: Payer: Self-pay

## 2019-09-04 VITALS — BP 146/100 | HR 95 | Temp 98.9°F | Resp 12 | Ht 71.0 in | Wt 145.0 lb

## 2019-09-04 DIAGNOSIS — M71021 Abscess of bursa, right elbow: Secondary | ICD-10-CM

## 2019-09-04 DIAGNOSIS — J432 Centrilobular emphysema: Secondary | ICD-10-CM

## 2019-09-04 MED ORDER — AMOXICILLIN 500 MG PO CAPS: 500.0000 mg | ORAL_CAPSULE | Freq: Three times a day (TID) | ORAL | 0 refills | Status: AC

## 2019-09-04 NOTE — Patient Outreach (Signed)
[facility]) Care Management  09/04/2019  [name] March 04, 1956 [id number]   Telephone Screen  Referral Date: 09/04/19 Referral Source: MD referral -Dr Syliva OvermanMargaret [name] office  Referral Reason: pt has medicare but can't afford medications does not have drug coverage- has emphysema   Insurance:Next[employer] Medicare/medicaid of [place] family planning [name] reports he was previously covered by [facility] and blue shield until his Nephew in [place] (pt reports nephew is a resident) assisted him in a new coverage plan   [name] has had no admission nor any ED visits in a [facility] facility in 2020 or the last 6 months  Care everywhere does not indicate other admissions or ED visits He has visited his primary MD office once or twice a month since June 2020  Outreach attempt # 1 to the home number was successful  Patient is able to verify HIPAA, DOB and address Reviewed and addressed [facility] with patient He gave permission for all [facility] staff to speak with his mother, Nicholas GerlachMyrtle [name] as needed  [name] reports he was placed on antibiotics for a raised fluid filled redden area on his elbow "the size of a golf ball". He reports it was examined today 09/04/19 at the MD office He is scheduled to see Dr Nicholas AppleHarrison to have the fluid removed next week    He states he is now aware that he did not have Medicare plan D to cover his medications He informs [facility] RN CM he no longer needs assistance with getting medications, have them all and will be getting his nephew to assist him to get Medicare part D coverage. [facility] RN CM discussed [facility] pharmacy services and the importance of Medicare plan D. [facility] RN CM discussed local [place] resources available to assist with choosing correct coverage options at the senior center.  [name] denied the need for [facility] pharmacy assistance or senior center assistance x 3   Social: [name] is at 63 year old single retired male on disability who lives  with his mother and sister. He retired 3 years ago per his mother as he began to have difficulty breathing. He has no children. He has a brother in the navy He has not been in the [employer]. He admits he has difficulty reading. He states his mother or sister assists with reading. His mother confirms he can read but does not understand some things he reads. He is independent in his ADLs but needs assist with his iADLs. He does not drive. His 563 year old mother, Nicholas CapriceMyrtle provides transportation assistance to medical appointments.  [name] reports [place], applied for services but is not eligible for further assistance for services or medications.  He and his mother voices he has "anger problems"  Conditions: COPD per pt- [facility] reports emphysema, HTN, Coronary atherosclerosis, allergic rhinitis, centrilobular emphysema, nicotine dependence, current smoker (he reports off and on "cause things get on my nerve"), depression with generalized anxiety disorder, lymphocytosis, neutropenia, vitamin D deficiency, alcohol addiction  DME: nebulizer, borrows his mother's BP cuff,  eye glasses   Medications:  He informs [facility] RN CM he has obtained all of his medications from his pharmacy on 09/04/19 but at this time continues to have Medicare part A & B but not part D [facility] him to get his medications by completing a "certificate" after he voiced hie was angry.  The pharmacist explained to him that his  medicaid family planning does not cover his medications per [name]. He reports paying about $6-7 today to get his antibiotics  He reports his nebulizer/inhaler cost went from "twelve dollars to one hundred dollars" Per MD visit note for 09/04/19 [name] the office NP is working on cost reduction. [name] reports because of him not taking medications as ordered he already has some of all his medications at the home except the new antibiotic he  picked up today 09/04/19 [facility] RN CM tried to encourage him to let [place] staff attempt to assist him but he refused on 09/04/19  [name] reports he has called to [place], applied for services but is not eligible for further assistance for services or medications  He denies concerns with questions about medications  He admits to [facility] RN CM that he does not take medications as prescribed and at times can not afford medications. He states 09/04/19 he will try to do better His mother also confirmed that [name] does not take medicines as ordered but " when he feels like it" She reports counseling him to take medications as ordered but "sometimes he thinks he knows more than he knows"   For example per [name]: His mother informs [facility] RN CM that [name] was informed by her today 09/04/19 that he needs to take his amoxicillin "three times a day" but she found out after they arrived home that [name] took "three pills at one time" She report she encouraged him to drink extra fluids and to eat a good meal. She reports she will continue to monitor him this evening for any side effects and contact the MD prn [facility] RN CM encouraged her to monitor him for N?V?D and to call MD if these persist of if she notes bleeding or darker urine [name] voiced understanding   Possible side effects; He reports some "black out and dizzy spells" but not sure if related to hypotension or medications Pt also has hx of alcohol addiction  [facility] RN CM encouraged him to use one of his mother's BP cuffs to check his BP prior to taking his BP medication to prevent hypotension s/s. He voiced understanding but also asked CM to review this with his mother.   Appointments: He reports he was seen today 09/04/19 by Cherly Beach PA at primary care MD office  09/07/19 1150 Dr Aline Brochure to establish care as new patient- evaluate for elbow abscess bursa as referred by Dr Moshe Cipro 01/27/20 0800 5 month f/u Dr Tula Nakayama 07/29/2020 0900 acute wellness visit for medicare pt Dr Tula Nakayama   Advance Directives: Denies need for assist with advance directives  Consent: [facility] RN CM reviewed [facility] services with patient. Patient gave verbal consent for services [facility] telephonic RN CM and [facility].   Plan: [facility] RN CM will follow up with Nicholas Jill Alexanders within the next 10-14 business days to  Re assess his medication and insurance coverage concerns plus assess for further needs  [name] will be referred to [facility] to get assistance with resources for transportation to medical appointments as he and his mother confirm she is getting older and he need an alternate resource.He agreed today to be contacted to be offered resources He gave permission for [facility] to speak with his mother prn   Pt encouraged to return a call to [facility] RN CM prn  [facility] RN CM sent a successful outreach letter as discussed with [facility] brochure enclosed  for review  Route note to MDs   [name], RN, BSN, CCM [facility] Telephonic Care Management Care Coordinator Office number [phone] Mobile number [phone]  Main [facility] number [phone] Fax number [phone]

## 2019-09-04 NOTE — Progress Notes (Signed)
Subjective:     Patient ID: [name], male   DOB: 03-Sep-1956, 63 y.o.   MRN: [id number]  [name] presents for knot on elbow (came up about a week and a half ago)  Reports that the middle of last week he had a knot come up on his right elbow.  Since the middle of last week he has noticed that is gotten a little bit bigger.  He now notices that it is warm.  It does not have any pain associated with it.  Full range of motion with the joint.  It shakes like it is Jell-O.  There is no drainage.  There is no head or area where pus has collected.  Has done nothing to relieve this.  Reports it does not feel the greatest when it is shaking.  It is been constant no pain though.  Additionally reports that he is having trouble affording his medications.  Would like to see if we could switch to Stelara or help him with the cost.  Will be referring to [facility] to help with this.   Today patient denies signs and symptoms of COVID 19 infection including fever, chills, cough, shortness of breath, and headache.  Past Medical, Surgical, Social History, Allergies, and Medications have been Reviewed.   Past Medical History:  Diagnosis Date  . Alcohol abuse   . Asthma   . COPD (chronic obstructive pulmonary disease) ([place])   . Coronary atherosclerosis    Based on chest CT 2012  . Essential hypertension, benign   . History of neutropenia    Also lymphocytosis - followed by Dr. Barnet Glasgow  . Nicotine addiction    Past Surgical History:  Procedure Laterality Date  . MULTIPLE TOOTH EXTRACTIONS  June 2012   Social History   Socioeconomic History  . Marital status: Single    Spouse name: Not on file  . Number of children: Not on file  . Years of education: Not on file  . Highest education level: Not on file  Occupational History  . Occupation: unemployed  Social Needs  . Financial resource strain: Not on file  . Food insecurity    Worry: Not on file    Inability: Not on file  .  Transportation needs    Medical: Not on file    Non-medical: Not on file  Tobacco Use  . Smoking status: Current Every Day Smoker    Packs/day: 0.25    Years: 38.00    Pack years: 9.50    Types: Cigarettes    Last attempt to quit: 12/02/2017    Years since quitting: 1.7  . Smokeless tobacco: Never Used  Substance and Sexual Activity  . Alcohol use: No    Alcohol/week: 18.0 standard drinks    Types: 18 Cans of beer per week    Frequency: Never    Comment: quit before Christmas 2018  . Drug use: No  . Sexual activity: Never  Lifestyle  . Physical activity    Days per week: Not on file    Minutes per session: Not on file  . Stress: Not on file  Relationships  . Social Herbalist on phone: Not on file    Gets together: Not on file    Attends religious service: Not on file    Active member of club or organization: Not on file    Attends meetings of clubs or organizations: Not on file    Relationship status: Not on  file  . Intimate partner violence    Fear of current or ex partner: Not on file    Emotionally abused: Not on file    Physically abused: Not on file    Forced sexual activity: Not on file  Other Topics Concern  . Not on file  Social History Narrative  . Not on file    Outpatient Encounter Medications as of 09/04/2019  Medication Sig  . albuterol (PROAIR HFA) 108 (90 Base) MCG/ACT inhaler INHALE 2 PUFFS INTO THE LUNGS EVERY SIX HOURS AS NEEDED FOR WHEEZING.  [name]. aspirin EC 81 MG tablet Take 81 mg by mouth daily.  . busPIRone (BUSPAR) 5 MG tablet Take 1 tablet (5 mg total) by mouth 3 (three) times daily.  . ergocalciferol (VITAMIN D2) 1.25 MG (50000 UT) capsule Take 1 capsule (50,000 Units total) by mouth once a week. One capsule once weekly  . ipratropium-albuterol (DUONEB) 0.5-2.5 (3) MG/3ML SOLN INHALE 1 VIAL VIA NEBULIZER EVERY SIX HOURS AS NEEDED.  [name]. mometasone-formoterol (DULERA) 200-5 MCG/ACT AERO Inhale 2 puffs into the lungs 2 (two) times daily.   [name]. triamterene-hydrochlorothiazide (MAXZIDE-25) 37.5-25 MG tablet TAKE (1) TABLET BY MOUTH ONCE DAILY.   No facility-administered encounter medications on file as of 09/04/2019.    Allergies  Allergen Reactions  . Lisinopril Swelling and Other (See Comments)    Reaction:  Angioedema    Review of Systems  All other systems reviewed and are negative. See HPI     Objective:     BP (!) 146/100   Pulse 95   Temp 98.9 F (37.2 C) (Oral)   Resp 12   Ht 5\' 11"  (1.803 m)   Wt 145 lb 0.6 oz (65.8 kg)   SpO2 99%   BMI 20.23 kg/m   Physical Exam Vitals signs and nursing note reviewed.  Constitutional:      Appearance: Normal appearance. He is obese.  HENT:     Head: Normocephalic and atraumatic.     Right Ear: External ear normal.     Left Ear: External ear normal.     Nose: Nose normal.     Mouth/Throat:     Pharynx: Oropharynx is clear.  Eyes:     General:        Right eye: No discharge.        Left eye: No discharge.     Conjunctiva/sclera: Conjunctivae normal.  Neck:     Musculoskeletal: Normal range of motion and neck supple.  Cardiovascular:     Rate and Rhythm: Normal rate and regular rhythm.     Pulses: Normal pulses.     Heart sounds: Normal heart sounds.  Pulmonary:     Effort: Pulmonary effort is normal.     Breath sounds: Examination of the right-upper field reveals wheezing. Examination of the left-upper field reveals wheezing. Examination of the right-middle field reveals wheezing. Examination of the left-middle field reveals wheezing. Examination of the right-lower field reveals decreased breath sounds. Examination of the left-lower field reveals decreased breath sounds. Decreased breath sounds and wheezing present.  Musculoskeletal:     Right elbow: He exhibits swelling and effusion. He exhibits normal range of motion. No tenderness found.     Comments: There is noted swelling/questionable bursitis sac off of the right olecranon process.  No decreased range  of motion.  No discomfort with movement or palpitation.  Warmth and redness are noted.  Skin:    General: Skin is warm.  Neurological:     General: No  focal deficit present.     Mental Status: He is alert and oriented to person, place, and time.  Psychiatric:        Mood and Affect: Mood normal.        Behavior: Behavior normal.        Thought Content: Thought content normal.        Judgment: Judgment normal.        Assessment and Plan        1. Centrilobular emphysema (HCC) Currently having trouble affording his medications.  Recent change from [facility] to [employer] part a and part B.  Does not have perky to help with drug coverage.  Advised for him to see if he can get this added.  Currently paying out-of-pocket for drug cost.  Elwin Sleight is too expensive have referred to [facility] for care management.  Possibly need to get my rec patient assistance if possible.   - AMB Referral to [facility] Care Management  2. Abscess of bursa of right elbow Questionable abscess/fluid buildup on the bursa of the right elbow.  Have referred to orthopedist to have fluid drawn off Monday.  In discussion with Dr. Lodema Hong have prescribed amoxicillin as there does not seem to be any purulent drainage at this time.  But would like to avoid any infection setting in.  With the warmth and redness currently.  Patient is asymptomatic of other signs and symptoms of infection.  Have prescribed him amoxicillin to be taken 3 times a day for 7 days.  Have provided him with good Rx coverage for [facility] for this affordability.  - Ambulatory referral to Orthopedic Surgery - amoxicillin (AMOXIL) 500 MG capsule; Take 1 capsule (500 mg total) by mouth 3 (three) times daily for 7 days.  Dispense: 21 capsule; Refill: 0       Follow-up: 01/27/2020  Freddy Finner, DNP, AGNP-BC [facility] [address], [address] [place], [place] [address] Office Hours: Mon-Thurs 8 am-5 pm; Fri 8  am-12 pm Office Phone:  [phone]  Office Fax: [phone]

## 2019-09-04 NOTE — Patient Instructions (Addendum)
    Thank you for coming into the office today. I appreciate the opportunity to provide you with the care for your health and wellness. Today we discussed: fluid on your elbow  Follow up: 01/27/2020  No labs or referrals today  We will work to get you an appt for the Ortho Dr to get the fluid drawn off Monday.  [facility] [place] [facility]  I will work on inhaler medications for cost reduction.  Please try and see if you can get medicare part D for drug coverage.  Please continue to practice social distancing to keep you, your family, and our community safe.  If you must go out, please wear a Mask and practice good handwashing.  [place] YOUR HANDS WELL AND FREQUENTLY. AVOID TOUCHING YOUR FACE, UNLESS YOUR HANDS ARE FRESHLY WASHED.  GET FRESH AIR DAILY. STAY HYDRATED WITH WATER.   It was a pleasure to see you and I look forward to continuing to work together on your health and well-being. Please do not hesitate to call the office if you need care or have questions about your care.  Have a wonderful day and week. With Gratitude, Cherly Beach, DNP, AGNP-BC

## 2019-09-07 ENCOUNTER — Encounter: Payer: Self-pay | Admitting: Orthopedic Surgery

## 2019-09-07 ENCOUNTER — Ambulatory Visit (INDEPENDENT_AMBULATORY_CARE_PROVIDER_SITE_OTHER): Payer: Medicare Other | Admitting: Orthopedic Surgery

## 2019-09-07 ENCOUNTER — Other Ambulatory Visit: Payer: Medicare Other

## 2019-09-07 ENCOUNTER — Other Ambulatory Visit: Payer: Self-pay

## 2019-09-07 VITALS — BP 106/85 | HR 104 | Temp 97.3°F | Ht 71.0 in | Wt 141.2 lb

## 2019-09-07 DIAGNOSIS — M7021 Olecranon bursitis, right elbow: Secondary | ICD-10-CM | POA: Diagnosis not present

## 2019-09-07 NOTE — Patient Outreach (Signed)
[facility]) Care Management  09/07/2019  [name] [birth year] [id number]  Social work referral received from [place], to contact patient regarding transportation resources.   Unsuccessful outreach to patient today.  Mailed unsuccessful outreach letter.  Will attempt to reach again within four business days.  Ronn Melena, BSW Social Worker [phone]

## 2019-09-07 NOTE — Patient Instructions (Signed)
Elbow Bursitis  Bursitis is swelling and pain at the tip of the elbow. This happens when fluid builds up in a sac under the skin (bursa). This may also be called olecranon bursitis. What are the causes? Elbow bursitis may be caused by:  Elbow injury, such as falling onto the elbow.  Leaning on hard surfaces for long periods of time.  Infection from an injury that breaks the skin near the elbow.  A bone growth (spur) that forms at the tip of the elbow.  A medical condition that causes inflammation, such as gout or rheumatoid arthritis. Sometimes the cause is not known. What are the signs or symptoms? The first sign of elbow bursitis is usually swelling at the tip of the elbow. This can grow to be about the size of a golf ball. Swelling may start suddenly or develop gradually. Other symptoms may include:  Pain when bending or leaning on the elbow.  Not being able to move the elbow normally. If bursitis is caused by an infection, you may have:  Redness, warmth, and tenderness of the elbow.  Drainage of pus from the swollen area over the elbow, if the skin breaks open. How is this diagnosed? This condition may be diagnosed based on:  Your symptoms and medical history.  Any recent injuries you have had.  A physical exam.  X-rays to check for a bone spur or fracture.  Draining fluid from the bursa to test it for infection.  Blood tests to rule out gout or rheumatoid arthritis. How is this treated? Treatment for elbow bursitis depends on the cause. Treatment may include:  Medicines. These may include: ? Over-the-counter medicines to relieve pain and inflammation. ? Antibiotic medicines. ? Injections of anti-inflammatory medicines (steroids).  Draining fluid from the bursa.  Wrapping your elbow with a bandage.  Wearing elbow pads. If these treatments do not help, you may need surgery to remove the bursa. Follow these instructions at home: Medicines  Take  over-the-counter and prescription medicines only as told by your health care provider.  If you were prescribed an antibiotic medicine, take it as told by your health care provider. Do not stop taking the antibiotic even if you start to feel better. Managing pain, stiffness, and swelling   If directed, put ice on your elbow: ? Put ice in a plastic bag. ? Place a towel between your skin and the bag. ? Leave the ice on for 20 minutes, 2-3 times a day.  If your bursitis is caused by an injury, rest your elbow and wear your bandage as told by your health care provider.  Use elbow pads or elbow wraps to cushion your elbow as needed. General instructions  Avoid any activities that cause elbow pain. Ask your health care provider what activities are safe for you.  Keep all follow-up visits as told by your health care provider. This is important. Contact a health care provider if you have:  A fever.  Symptoms that do not get better with treatment.  Pain or swelling that: ? Gets worse. ? Goes away and then comes back.  Pus draining from your elbow. Get help right away if you have:  Trouble moving your arm, hand, or fingers. Summary  Elbow bursitis is inflammation of the fluid-filled sac (bursa) between the tip of your elbow bone (olecranon) and your skin.  Treatment for elbow bursitis depends on the cause. It may include medicines to relieve pain and inflammation, antibiotic medicines, and draining fluid from your elbow.    Contact a health care provider if your symptoms do not get better with treatment, or if your symptoms go away and then come back. This information is not intended to replace advice given to you by your health care provider. Make sure you discuss any questions you have with your health care provider. Document Released: 01/02/2007 Document Revised: 11/15/2017 Document Reviewed: 11/12/2017 Elsevier Patient Education  2020 Elsevier Inc.  

## 2019-09-07 NOTE — Progress Notes (Signed)
[name]  09/07/2019  HISTORY SECTION :  Chief Complaint  Patient presents with  . Elbow Problem    right elbow, abscess on right elbow, first notice 2 weeks ago. Denies pain   HPI The patient presents for evaluation of pain swelling right elbow for 2 weeks.  Pain is located over the right elbow but really not having any pain is more swelling he does feel sometimes a dull ache at least illicitly but he is been on antibiotics and it seems to be going down it is no longer severe although the olecranon bursa is swollen and warm to touch no drainage  Review of Systems  Constitutional: Negative for chills and fever.  Skin: Negative.      has a past medical history of Alcohol abuse, Asthma, COPD (chronic obstructive pulmonary disease) ([place]), Coronary atherosclerosis, Essential hypertension, benign, History of neutropenia, and Nicotine addiction.   Past Surgical History:  Procedure Laterality Date  . MULTIPLE TOOTH EXTRACTIONS  June 2012    Body mass index is 19.69 kg/m.   Allergies  Allergen Reactions  . Lisinopril Swelling and Other (See Comments)    Reaction:  Angioedema     Current Outpatient Medications:  .  albuterol (PROAIR HFA) 108 (90 Base) MCG/ACT inhaler, INHALE 2 PUFFS INTO THE LUNGS EVERY SIX HOURS AS NEEDED FOR WHEEZING., Disp: 24 g, Rfl: 3 .  amoxicillin (AMOXIL) 500 MG capsule, Take 1 capsule (500 mg total) by mouth 3 (three) times daily for 7 days., Disp: 21 capsule, Rfl: 0 .  aspirin EC 81 MG tablet, Take 81 mg by mouth daily., Disp: , Rfl:  .  busPIRone (BUSPAR) 5 MG tablet, Take 1 tablet (5 mg total) by mouth 3 (three) times daily., Disp: 270 tablet, Rfl: 3 .  ergocalciferol (VITAMIN D2) 1.25 MG (50000 UT) capsule, Take 1 capsule (50,000 Units total) by mouth once a week. One capsule once weekly, Disp: 12 capsule, Rfl: 3 .  ipratropium-albuterol (DUONEB) 0.5-2.5 (3) MG/3ML SOLN, INHALE 1 VIAL VIA NEBULIZER EVERY SIX HOURS AS NEEDED., Disp: 360 mL, Rfl:  3 .  mometasone-formoterol (DULERA) 200-5 MCG/ACT AERO, Inhale 2 puffs into the lungs 2 (two) times daily., Disp: 13 g, Rfl: 3 .  triamterene-hydrochlorothiazide (MAXZIDE-25) 37.5-25 MG tablet, TAKE (1) TABLET BY MOUTH ONCE DAILY., Disp: 90 tablet, Rfl: 3   PHYSICAL EXAM SECTION: 1) BP 106/85 (BP Location: Left Arm)   Pulse (!) 104   Temp (!) 97.3 F (36.3 C)   Ht 5\' 11"  (1.803 m)   Wt 141 lb 3.2 oz (64 kg)   BMI 19.69 kg/m   Body mass index is 19.69 kg/m. General appearance: Well-developed well-nourished no gross deformities  2) Cardiovascular normal pulse and perfusion in the upper extremities normal color without edema  3) Neurologically deep tendon reflexes are equal and normal, no sensation loss or deficits no pathologic reflexes  4) Psychological: Awake alert and oriented x3 mood and affect normal  5) Skin no lacerations or ulcerations no nodularity no palpable masses, no erythema or nodularity  6) Musculoskeletal: Left elbow looks normal with no tenderness normal range of motion no swelling  Right elbow skin no drainage looks like it has been swollen is a little warm to touch there is a larger swelling there range of motion is normal shoulder is normal the elbow is normal in terms of stability muscle strength and tone are normal   MEDICAL DECISION SECTION:  Encounter Diagnosis  Name Primary?  [name] Olecranon bursitis, right elbow  Yes    Imaging None  Plan:  (Rx., Inj., surg., Frx, MRI/CT, XR:2)  Aspiration 18 cc of fluid from the right elbow  The patient gave consent we took timeout to aspirate his right elbow after cleaning the skin with alcohol and sprayed with ethyl chloride and 18-gauge needle was used to aspirate the fluid the fluid was yellow clear nonpurulent and about 18 cc  A Band-Aid and an Ace bandage was applied  Follow-up 3 weeks  12:02 PM Fuller Canada, MD  09/07/2019

## 2019-09-08 ENCOUNTER — Other Ambulatory Visit: Payer: Self-pay

## 2019-09-08 NOTE — Patient Outreach (Signed)
[facility]) Care Management  09/08/2019  [name] 28-Dec-1955 [id number]   Successful outreach to patient today regarding social work referral for transportation resources. Patient reports that mother has been taking him to MD appointments but he is needing another resource in case she is unable to do so.  Patient unaware of [facility].  Informed patient about service and provided contact information.  Closing social work case at this time.   Ronn Melena, BSW Social Worker [phone]

## 2019-09-09 ENCOUNTER — Ambulatory Visit: Payer: Medicare Other | Admitting: Orthopedic Surgery

## 2019-09-18 ENCOUNTER — Other Ambulatory Visit: Payer: Self-pay | Admitting: *Deleted

## 2019-09-18 ENCOUNTER — Other Ambulatory Visit: Payer: Self-pay

## 2019-09-18 NOTE — Patient Outreach (Addendum)
[facility]) Care Management  09/18/2019  [name] 07/07/1956 [id number]   Telephone Screen/further assessment and follow up from contact #1   Referral Date: 09/04/19 Referral Source: MD referral -Dr Tula Nakayama office  Referral Reason: pt has medicare but can't afford medications does not have drug coverage- has emphysema   Insurance:NextGen Medicare/medicaid of [employer] family planning Mr Nest reports he was previously covered by [facility] cross and blue shield until his Nephew in [place] (pt reports nephew is a resident) assisted him in a new coverage plan   Mr Wilms has had no admission nor any ED visits in a [facility] facility in 2020 or the last 6 months  Care everywhere does not indicate other admissions or ED visits He has visited his primary MD office once or twice a month since June 2020  Outreach attempt # 2 to the home number was successful  Patient is able to verify HIPAA, DOB and address Reviewed and addressed referral to [facility] patient He gave permission for all [facility] staff to speak with his mother, Chun Sellen as needed  Follow up  Elbow abscess/bursitis  He has to complete his antibiotics. He denies redness, pain or swelling of the elbow site He reports the band aid and ace wrap are off.  Dr Aline Brochure f/u is on 09/28/19   [facility] services Mr Enns confirms he spoke with [facility] and was given [facility] contact number as [phone] [place] completed at conference with him with the attempt to get him set up as a new medicaid patient but Mr Benzel does not have his medicaid nor medicare card. He and his mother report he was not mailed one. He reports he has not been to a [facility] facility since he was given medicare and family planning medicaid, therefore the information has not been scanned in and his present providers confirmed his coverage using office national database. Mr Molstad was noted to increase his tone of voice and speak about his  frustration with having to find this information. He reports having letters from medicare and medicaid. [facility] RN CM discussed and educated him that standardly most people carry their insurance cards with them in case needed for medical services whether expected or unexpected. Explained to him that his previous information was not scanned in [place]. He voiced understanding and reports he would find his letters or ask Dr Moshe Cipro office for a copy of his medicaid and medicare information and put them in his wallet.   Medicare plan D to cover his medications Mr Mccollum spoke with his nephew in [place] who assisted him with getting set up with Medicare he states a year ago. His nephew referred him to local [facility] staff or [facility] RN CM. Mr Schnapp is now agreeing to have [facility] RN CM assist him with [facility] services during active Medicare enrollment period to get Part D added. [facility] RN CM completed conference calls to his local senior resource center/[facility] and left a message for Doug Sou of [facility] program requesting a return call to pt to help with Medicare enrollment and adding Medicare part D. [facility] RN CM completed a conference call to [employer] program (Senior Medicare Patrol at [phone]) and spoke with Tamika who encouraged a return call starting October 01 2019. Tamika confirms he will need his medicare card information to get set up for Medicare part D   Social: Mr Eastridge is at 63 year old single retired male on disability who lives with  his mother and sister. He retired 3 years ago per his mother as he began to have difficulty breathing. He has no children. He has a brother in the navy He has not been in the [employer]. He admits he has difficulty reading. He states his mother or sister assists with reading. His mother confirms he can read but does not understand some things he reads. He is independent in his ADLs but needs assist with his iADLs. He does not drive. His 61 year old mother, Janifer Adie provides  transportation assistance to medical appointments.  Mr Granada reports he has called to [facility], applied for services but is not eligible for further assistance for services or medications.  He and his mother voices he has "anger problems"  Conditions: COPD per pt- [facility] reports emphysema, HTN, Coronary atherosclerosis, allergic rhinitis, centrilobular emphysema, nicotine dependence, current smoker (he reports off and on "cause things get on my nerve"), depression with generalized anxiety disorder, lymphocytosis, neutropenia, vitamin D deficiency, alcohol addiction  DME: nebulizer, borrows his mother's BP cuff,  eye glasses   Medications:  all of his medications obtained from his [place] on 09/04/19 The [facility] pharmacist assisted him to get his medications by completing a "certificate" after he voiced hie was angry.  The pharmacist explained to him that his medicaid family planning does not cover his medications per Mr Seder. Mr Morrish reports he has called to [facility], applied for services but is not eligible for further assistance for services or medications  He reports he is taking his antibiotics and is about finish He states he will take them all as ordered after [facility] RN CM counseled him on medication adherence   He denies any symptoms from his incident of taking too many antibiotics on the first dose. He denies any  GI s/s NVD.     Appointments: He reports he was seen 09/07/19 1150 by Dr Aline Brochure to establish care as new patient- for elbow abscess bursitis as referred by Dr Moshe Cipro 09/28/19 1040 Dr Aline Brochure, orthopedic Follow up 01/27/20 0800 Dr Moshe Cipro Primary care MD  5 month f/u 8/31/200900 Dr Moshe Cipro Annual wellness Medicare visit with Dr Moshe Cipro RN  Advance Directives: Denies need for assist with advance directives  Consent: [facility] RN CM reviewed [facility] services with patient. Patient gave verbal consent for services [facility] telephonic RN  CM and [facility] SW.   Plan: [facility] RN CM will follow up with Mr Jill Alexanders within the next 10-14 business days to re assess and assist with [facility] setup, Medicare part D enrollment during the active enrollment period and further education on major medical conditions  Pt encouraged to return a call to [facility] RN CM prn  Route note to MDs  [facility] CM Care Plan Problem One     Most Recent Value  Care Plan Problem One  Medication insurance coverage affects cost  Role Documenting the Problem One  Care Management Telephonic Coordinator  Care Plan for Problem One  Active  [facility] Long Term Goal   over the next 29 day patietn will have insurance coverage for medication benefit or medication assistance as evidence by documentation in EPIC or patient/family verbalizing during contact  [place] Goal Start Date  09/04/19  Interventions for Problem One Long Term Goal  assess status of medicare part D coverage assist from nephew, discussed available local resourcecs, completed conference calls with him to leave message for local [facility] staff and to call medicare [facility] program, discussed upcoming active  enrollment for medicare, scheduled to folllow up, discussed concerns with his mother  with his permission   [facility] CM Short Term Goal #1   over the next 14 day patient will work with his nephew, MD or a pharmacy staff to obtain medicare part D or medication assistance as evidence by report of his MD, family or a pharmacist  [facility] CM Short Term Goal #1 Start Date  09/04/19  Interventions for Short Term Goal #1  assess status of medicare part D coverage assist from nephew, discussed available local resourcecs, completed conference calls with him to leave message for local [facility] staff and to call medicare [facility] program, discussed upcoming active enrollment for medicare, scheduled to folllow up, discussed concerns with his mother  with his permission     [facility] CM Care Plan Problem Two     Most Recent Value  Care Plan Problem Two   transportation to medical appointments  Role Documenting the Problem Two  Care Management Telephonic Coordinator  Care Plan for Problem Two  Active  Interventions for Problem Two Long Term Goal   assess status, completed conference call to [facility], encouraged pt to get copy of medicaid and medicare numbers/letters scheduled to folllow up, discussed concerns with his mother  with his permission   [facility] Long Term Goal  over the next 29 days patient will be able to access local medical transportation to medical appointments as evidence by verbalizaton during contact with patient and mother or noted documentation   [facility] Long Term Goal Start Date  09/04/19  [facility] CM Short Term Goal #1   over the next 7 day patient will receive alternate medical transportation options/resouce information as evidence by verbalization during contact  [facility] CM Short Term Goal #1 Start Date  09/04/19  [facility] CM Short Term Goal #1 Met Date   09/18/19       Joelene Millin L. Lavina Hamman, RN, BSN, [place] Coordinator Office number [phone] Mobile number [phone]  Main [facility] number [phone] Fax number [phone]

## 2019-09-28 ENCOUNTER — Other Ambulatory Visit: Payer: Self-pay

## 2019-09-28 ENCOUNTER — Ambulatory Visit (INDEPENDENT_AMBULATORY_CARE_PROVIDER_SITE_OTHER): Payer: Medicare Other | Admitting: Orthopedic Surgery

## 2019-09-28 VITALS — BP 105/71 | HR 100 | Temp 97.7°F | Ht 71.0 in | Wt 139.0 lb

## 2019-09-28 DIAGNOSIS — M7021 Olecranon bursitis, right elbow: Secondary | ICD-10-CM

## 2019-09-28 NOTE — Progress Notes (Addendum)
Chief Complaint  Patient presents with  . Follow-up    Recheck on right elbow.    63 year old male male status post aspiration right elbow for bursitis he was on antibiotics to prophylactically treat for infection comes in with a slight amount of swelling over the right elbow  We reaspirated only got 2 cc of yellow cloudy fluid  Review of Systems  Constitutional: Negative for chills and fever.  Skin: Negative.      Advised to use compression wrap and see [place] on an as-needed basis  Right elbow consent was given to aspirate timeout to confirm site area cleaned with alcohol and ethyl chloride used to anesthetize the skin 18-gauge needle aspirated 2 cc of cloudy yellow fluid tolerated well

## 2019-09-30 ENCOUNTER — Other Ambulatory Visit: Payer: Self-pay | Admitting: Family Medicine

## 2019-10-01 ENCOUNTER — Ambulatory Visit: Payer: Self-pay | Admitting: *Deleted

## 2019-10-02 ENCOUNTER — Ambulatory Visit: Payer: Self-pay | Admitting: *Deleted

## 2019-10-05 ENCOUNTER — Other Ambulatory Visit: Payer: Self-pay | Admitting: *Deleted

## 2019-10-05 NOTE — Patient Outreach (Signed)
  [name]) Care Management  10/05/2019  [name] March 31, 1956 [id number]   Telephone Screen/further assessment and follow up from contact #1   Referral Date:09/04/19 Referral Source:MD referral -Dr Tula Nakayama office Referral Reason:pt has medicare but can't afford medications does not have drug coverage- has emphysema   Insurance:NextGen Medicare/medicaid of [place] family planning [name] reports he was previously covered by [name] cross and blue shield until his Nephew in [place] (pt reports nephew is a resident) assisted him in a new coverage plan   [name] has had no admission nor any ED visits in a [name] facility in 2020 or the last 6 months  Care everywhere does not indicate other admissions or ED visits He has visited his primary MD office once or twice a month since June 2020  Outreach attempt #3to the home number was unsuccessful Previously he gave permission for all [name] staff to speak with his mother, [name] as needed  No answer. [facility] RN CM left HIPAA compliant voicemail message along with CM's contact info.     Appointments: He was seen 09/07/19 1150 by Dr Aline Brochure to establish care as new patient- for elbow abscess bursitis as referred by Dr Moshe Cipro and on 09/28/19 1040 had a follow up with Dr Aline Brochure, orthopedic  01/27/20 0800 Dr Moshe Cipro Primary care MD  5 month f/u 8/31/200900 Dr Moshe Cipro Annual wellness Medicare visit with Dr Moshe Cipro RN   Plan: [facility] RN CM willfollow up with [name] within the next 7-10 business days to re assess and assist with [facility] setup, Medicare part D enrollment during the active enrollment period and further education on major medical conditions  Pt encouraged to return a call to [facility] RN CM prn  Route note to MDs  Joelene Millin L. Lavina Hamman, RN, BSN, [place] Coordinator Office number [phone] Mobile number [phone]  Main [name] number [phone] Fax  number [phone]

## 2019-10-06 ENCOUNTER — Other Ambulatory Visit: Payer: Self-pay | Admitting: *Deleted

## 2019-10-06 NOTE — Patient Outreach (Signed)
[place] [facility]) Care Management  10/06/2019  BOLIVAR KORANDA 1956/05/14 [id number]   Telephone Screen/further assessment and follow up  Referral Date: 09/04/19 Referral Source: MD referral -Dr Tula Nakayama office  Referral Reason: pt has medicare but can't afford medications does not have drug coverage- has emphysema    Insurance:NextGen Medicare/medicaid of [place] family planning effective 10/23/2017 Mr Safi reports he was previously covered by [facility] cross and blue shield until his Nephew in [place] (pt reports nephew is a resident) assisted him in a new coverage plan    Mr Rushing has had no admission nor any ED visits in a [facility] facility in 2020 or the last 6 months  Care everywhere does not indicate other admissions or ED visits He has visited his primary MD office once or twice a month since June 2020   Patient returned a call to [facility] RN CM Patient is able to verify HIPAA, DOB and address Reviewed and addressed referral to [facility] with patient He gave permission for all [facility] staff to speak with his mother, Deanna Boehlke as needed  At the beginning of the call Mr Reh was calling [facility] RN CM "Karlene Einstein" When questioned he reports NP Cherly [place] gave him a card with this name and the number [phone].  [facility] RN CM informed him again of this CM's name and purpose of the previous calls to him. He voiced understanding but it took multiple attempts and  explanation.  Consent: THN RN CM reviewed [facility] services with patient. Patient gave verbal consent for [facility] telephonic RN CM services.  Medicare part D  THN RN CM assisted Mr Wolke with getting Medicare Part D  He states today his goal is "to get medicare part D so I can get my medicines cheaper" Mr Jablonowski and [facility] RN CM called [place] [facility] [place] [phone]) but he is unable to offer his Social security disability medicare part A & B effective date for the staff member Alison Murray to assist him.  Tiesha recommended calling  medicare at [place]  Leron Croak at [facility] [phone] assisted to find the disability social security medicare correct ID number as [id number] The effective date was June 17 2019 for Medicare part A& B per Elberta Fortis. THN RN CM assisted with list of medications  Mr Brill qualified for and was offered [employer] coverage. He agreed to [employer]. He was informed his Hum[employer] medicare coverage will be effective on October 18 2019. The [facility] medicare confirmation number provided is [id number]  [facility] Medicare can be called at [phone] the card or letter does not arrive in 10 days  The [facility] coverage will be able to be used at [employer] and [employer] The comparison completed by Elberta Fortis indicated the all of Mr Wither's non OTC medicines will be cheaper at [facility]. Mr Rolf agreed to allow Elberta Fortis to have the $13+ dollars for this coverage to be taken from his monthly Social security amount. THN RN CM repeated this to make sure Mr Mijangos understood and he stated he did.  Elberta Fortis ordered a new medicare card to be sent to Mr Camey in the mail after verifying the address. Elberta Fortis shared that the last card may have been lost in the mail A temporary letter will be mailed also to his address to be used pending his medicare card   During the wait time [facility] RN CM had also left messages for his local [facility] office  staff June Carlson and [facility] staff. Mr Capobianco number had been left for the return call   [place] RN CM was able to call the "Karlene Einstein" number [phone]  Mr Davisson mentioned frequently today. The staff member's name is Ava not "Karlene Einstein" and she states she has worked with Mr Melander previously for [facility]. He recalls who she is. Mr Traum was strongly encouraged to return a call to her prior to the next [facility] RN CM contact to being working with her again. He states he recalls his primary MD "mentioned I need to do that" Throughout the calls  [facility] RN CM had to frequently redirect him and encourage him to actively listen to the person speaking  Elbow abscess/bursitis He updated [facility] RN CM that he did follow up with Dr Aline Brochure and his elbow is healing   Social: Mr Santilli is at 63 year old single retired male on disability who lives with his mother and sister. He retired 3 years ago per his mother as he began to have difficulty breathing. He has no children. He has a brother in the navy He has not been in the [employer]. He admits he can read but has difficulty reading and understanding what he reads. He states his mother or sister assists with reading. His mother confirms he can read but does not understand some things he reads. He is independent in his ADLs but needs assist with his iADLs. He does not drive. His 64 year old mother, Janifer Adie or sister provides transportation assistance to medical appointments.  He likes to [employer] Mr Osmond reports he has called to [facility], applied for services but is not eligible for further assistance for services or medications.  He and his mother voices he has "anger problems"   Conditions: COPD per pt- Referral to [facility] reports emphysema, HTN, Coronary atherosclerosis, allergic rhinitis, centrilobular emphysema, nicotine dependence, current smoker (he reports off and on "cause things get on my nerve"), depression with generalized anxiety disorder, lymphocytosis, neutropenia, vitamin D deficiency, alcohol addiction   DME: nebulizer, borrows his mother's BP cuff, eyeglasses    Appointments: 01/27/20 0800 Dr Moshe Cipro Primary care MD  17-monthf/u 8/31/200900 Dr SMoshe CiproAnnual wellness Medicare visit with Dr SMoshe CiproRN    Advance Directives: Denies need for assist with advance directives    plan [facility] HospitalRN CM will follow up with Mr WKinzlerwithin 14-21 days for follow up  Pt encouraged to return a call to TW[facility]CRN CM prn   Route note to MDs   TMercy Medical Center - ReddingCM Care Plan Problem One     Most Recent  Value  Care Plan Problem One  Medication insurance coverage affects cost  Role Documenting the Problem One  Care Management Telephonic Coordinator  Care Plan for Problem One  Active  TSchick Shadel HosptialLong Term Goal   over the next 29 day patietn will have insurance coverage for medication benefit or medication assistance as evidence by documentation in EPIC or patient/family verbalizing during contact  TLakeviewGoal Start Date  09/04/19  TBrentwood HospitalLong Term Goal Met Date  10/06/19  TS[facility] LPCM Short Term Goal #1   over the next 14 day patient will work with his nephew, MD or a pharmacy staff to obtain medicare part D or medication assistance as evidence by report of his MD, family or a pharmacist  TCampus Surgery Center LLCCM Short Term Goal #1 Start Date  09/04/19  TSportsortho Surgery Center LLCCM Short Term Goal #1 Met Date  10/06/19    THN CM Care Plan Problem Two     Most Recent Value  Care Plan Problem Two  transportation to medical appointments  Role Documenting the Problem Two  Care Management Telephonic [place] for Problem Two  Active  THN Long Term Goal  over the next 29 days patient will be able to access local medical transportation to medical appointments as evidence by verbalizaton during contact with patient and mother or noted documentation   THN Long Term Goal Start Date  09/04/19  [facility] Long Term Goal Met Date  10/06/19  THN CM Short Term Goal #1   over the next 7 day patient will receive alternate medical transportation options/resouce information as evidence by verbalization during contact  THN CM Short Term Goal #1 Start Date  09/04/19  [facility] CM Short Term Goal #1 Met Date   09/18/19    [facility] CM Care Plan Problem Three     Most Recent Value  Care Plan Problem Three  Behavior health /anxiety  Role Documenting the Problem Three  Care Management Telephonic Coordinator  Care Plan for Problem Three  Active  THN Long Term Goal   over the next 30 days patient will make an effort to work with recommended [facility] staff and taking his  medicine as evidence by verbalization and a possible appointment stated during a follow up call  [place] Term Goal Start Date  10/06/19  Interventions for Problem Three Long Term Goal  Discussed [place], Completed a conference call with patient to [facility] staff,  strongly encouraged to return a call to her prior to the next [facility] RN CM contact to being working with her again  [facility] CM Short Term Goal #1   over the next 20 days the patient will call [facility] staff recommended for possilble services as verbalized during follow up call  THN CM Short Term Goal #1 Start Date  10/06/19  Interventions for Short Term Goal #1  Discussed [place], Completed a conference call with patient to [facility] staff,  strongly encouraged to return a call to her prior to the next [facility] RN CM contact to being working with her again      [place]. Lavina Hamman, RN, BSN, [place] Coordinator Office number [phone] Mobile number [phone]  Main THN number [id number] Fax number [phone]

## 2019-10-07 ENCOUNTER — Ambulatory Visit: Payer: Self-pay | Admitting: *Deleted

## 2019-10-21 ENCOUNTER — Encounter: Payer: Self-pay | Admitting: *Deleted

## 2019-10-21 ENCOUNTER — Other Ambulatory Visit: Payer: Self-pay

## 2019-10-21 ENCOUNTER — Other Ambulatory Visit: Payer: Self-pay | Admitting: *Deleted

## 2019-10-21 NOTE — Patient Outreach (Signed)
[name]) Care Management  10/21/2019  [name] March 31, 1956 [id number]   Telephone Screen/further assessment and follow up Referral Date:09/04/19 Referral Source:MD referral -[name] office Referral Reason:pt has medicare but can't afford medications does not have drug coverage- has emphysema   Insurance:NextGen Medicare/medicaid of [place] family planning effective 10/23/2017 Humana medicare effective 10/06/19    Outreach attempt successful  Patient is able to verify HIPAA, DOB and address Reviewed and addressed reason for follow up   Medicare part D  [name] confirms he received his  medicare card in the mail today, 10/21/19 He was able to confirm his medicare ID number  He was encouraged to place his card in his wallet so that it can be presented to all his MDs, pharmacy and any medical providers. He voiced understanding  [name]) [name] has not called [name] at [phone] to discuss services with her  He reports she called him but he did not have his new insurance card and reports he was not able to schedule an appointment with her  Vision [name] was noted during the call to request the glasses of his mother while looking for the [facility] medicare ID number. THN RN CM assessed him for visual concerns and he reports he has not been seen by an ophthalmologist since he was working. 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 He reports "this is why I had to come out" (stop working) He states [name] has discussed his COPD with him and told him to use his inhaler and/or nebulizer when he had difficulty breathing. In the last few weeks he reports he has used his inhaler but the s/s was related to smoking. He is aware that he needs to stop smoking He  reports he has "cut back" He reports smoking about a "half a pack a day" when he previously was smoking "two to three packs a day" He has worn a smoke cessation patch but reports it made "me sick" He has not tried other smoke cessation interventions.  THN RN CM counseled him on smoking and encouraged him to contact [name], [name] to discuss and to be offered resources. THN RN CM discussed re assessing whether or not he has complied with during the next follow up call. He voiced understanding   Social: [name] is at 73 year old single retired male on disability who lives with his mother and [name]. He retired 3 years ago per his mother as he began to have difficulty breathing. He has no children. He has a brother in the navy He has not been in the [employer]. He admits he can read but has difficulty reading and understanding what he reads. He states his mother or [name] assists with reading. His mother confirms he can read but does not understand some things he reads. He is independent in his ADLs but needs assist with his iADLs. He does not drive. His 82 year old mother, [name] provides transportation assistance to medical appointments.  He likes to [employer] [name] reports he has called to [name], applied for services but is not eligible for further assistance for services or medications.  He and his mother voices he has "anger problems"  Conditions:COPD per pt- [facility] to [name] [name] reports emphysema,  HTN, Coronary atherosclerosis, allergic rhinitis, centrilobular emphysema, nicotine dependence, currentsmoker (he reportsoff andon "cause things get on my nerve"), depression with generalizedanxietydisorder, lymphocytosis, neutropenia, vitamin D deficiency, alcohol addiction  BJY:NWGNFAOZH, borrows his mother's BP cuff,eyeglasses    Appointments: 01/27/20 0800 [name] care MD65-monthf/u  plan TVision Surgery And Laser [name] LLCRN CM will follow up with Nicholas WMilhornwithin 14-21 days for  follow up  Pt encouraged to return a call to [name]  Route note to MDs   [name]     Most Recent Value  Care Plan Problem One  Medication insurance coverage affects cost  Role Documenting the Problem One  Care Management Telephonic Coordinator  Care Plan for Problem One  Active  THN Long Term Goal   over the next 29 day patietn will have insurance coverage for medication benefit or medication assistance as evidence by documentation in EPIC or patient/family verbalizing during contact  TRound RockTerm Goal Start Date  09/04/19  TKindred Hospital St Louis SouthLong Term Goal Met Date  10/06/19  TOgden Regional Medical CenterCM Short Term Goal #1   over the next 14 day patient will work with his nephew, MD or a pharmacy staff to obtain medicare part D or medication assistance as evidence by report of his MD, family or a pharmacist  THN CM Short Term Goal #1 Start Date  09/04/19  TSurgical Arts CenterCM Short Term Goal #1 Met Date  10/06/19    TBanner [facility] West Surgery [name] LLCCM Care Plan Problem Two     Most Recent Value  Care Plan Problem Two  transportation to medical appointments  Role Documenting the Problem Two  Care Management Telephonic Coordinator  Care Plan for Problem Two  Active  THN Long Term Goal  over the next 29 days patient will be able to access local medical transportation to medical appointments as evidence by verbalizaton during contact with patient and mother or noted documentation   THN Long Term Goal Start Date  09/04/19  TSt. Elizabeth HospitalLong Term Goal Met Date  10/06/19  THN CM Short Term Goal #1   over the next 7 day patient will receive alternate medical transportation options/resouce information as evidence by verbalization during contact  THN CM Short Term Goal #1 Start Date  09/04/19  THamilton Endoscopy And Surgery [name] LLCCM Short Term Goal #1 Met Date   09/18/19    TPeacehealth St John Medical CenterCM Care Plan Problem Three     Most Recent Value  Care Plan Problem Three  Behavior health /anxiety  Role Documenting the Problem Three  Care Management Telephonic Coordinator  Care Plan for Problem Three   Active  THN Long Term Goal   over the next 30 days patient will make an effort to work with recommended [facility] and taking his medicine as evidence by verbalization and a possible appointment stated during a follow up call  TClarionTerm Goal Start Date  10/06/19  Interventions for Problem Three Long Term Goal  Assessed for compliance with calling BParkwest Surgery [name] LLCstaff, encouraged to call BB[facility]staff now that he has received new insurance card  TMyrtue Memorial HospitalCM Short Term Goal #1   over the next 30 days the patient will call BAloha Surgical [name] LLCstaff recommended for possilble services as verbalized during follow up call  THN CM Short Term Goal #1 Start Date  10/21/19  Interventions for Short Term Goal #1  Assessed for compliance with calling BTexas Endoscopy Centers LLCstaff, encouraged to call BSaint ALPhonsus Regional Medical Centerstaff now that he has received new insurance card  TWichita Endoscopy [name] LLCCM Short Term Goal #2   over the  next 30 days patient will attend an appointment with an eye provider as confirmed by the attended appointment and verbalization during follow up call   [facility] CM Short Term Goal #2 Start Date  10/21/19  Interventions for Short Term Goal #2  Assesed vision needs, discussed in network MDs to check his eyes, discuss an appointment to get glasses, to assist with appointment prn      Nicholas Stodghill L. Lavina Hamman, RN, BSN, [place] Coordinator Office number [phone] Mobile number [phone]  Main THN number [phone] Fax number [phone]

## 2019-11-04 ENCOUNTER — Other Ambulatory Visit: Payer: Self-pay | Admitting: *Deleted

## 2019-11-04 ENCOUNTER — Telehealth: Payer: Self-pay | Admitting: Orthopedic Surgery

## 2019-11-04 ENCOUNTER — Other Ambulatory Visit: Payer: Self-pay

## 2019-11-04 NOTE — Telephone Encounter (Signed)
Voice message received 11/04/19, left 3:30pm, from [place], indicating that per a virtual visit with patient, his elbow has a large amount of swelling, "about the size of a golf ball", and advised appointment with Dr Aline Brochure. Phone number on message was given as patient's home [phone].  I called the home phone, reached patient's mother, who states she takes care of him -he was home, in the background. She said she is aware of the visit with the nurse today, but did not know of any problems. Said patient was seen by Dr Aline Brochure last month for elbow problem, and that fluid was taken off.  Said if nurse would like him to have another appointment, that is fine. I relayed that our first available is 11/16/19 due to Dr in surgery on some days, and due to  holiday week. I relayed that if he is needing to be seen before then, he can go to emergency room, or contact his primary care. She voiced understanding. (updated Dpr form printed and sent with patient appointment information)

## 2019-11-04 NOTE — Patient Outreach (Signed)
[facility]) Care Management  11/04/2019  [name] 1956-03-31 [id number]   Telephone Screen/further assessment and follow up Referral Date:09/04/19 Referral Source:MD referral -Dr Tula Nakayama office Referral Reason:pt has medicare but can't afford medications does not have drug coverage- has emphysema  Insurance:[employer] Medicare/[place]icaid of [place] 10[place]23/2017  [employer] medicare effective 10/06/19   [name] has had no admission nor any ED visits in a [facility] facility in 2020 or the last 6 months  Care everywhere does not indicate other admissions or ED visits  Outreach attempt successful  Patient is able to verify HIPAA, DOB and address Reviewed and addressed reason for follow up He gave permission for all [facility] staff to speak with his mother, [name] as needed    He confirm he has his new [employer] card He has laminated it and has used it at the local pharmacy already He reports the monthly cost of all his medications was $22  Right elbow abscess He reports his arm is warm with a golf ball sized raised area He was encouraged to make a follow up appointment with Dr Aline Brochure  [facility] RN CM left a message at Dr Aline Brochure office for increased s/s of warmth and raised area the size of a golf ball. Requested a return cal to [name] for f/u care   COPD He reports he gets sob with exertion (mowing acres of his yard) but he uses his inhaler and nebulizer to resolve the s/s   [facility] [address] [place], [place] [address] [place] [address] professional dr Linna Hoff [place] [phone]  An appointment made for 02/17/20 0920 Pt is appreciative of services rendered   Behavioral health services He has not called [facility] at [phone] He reports losing the card He was given the number again. [facility] RN Cm attempted a warm transfer call to [phone] but no answer  and a HIPAA compliant message was left requesting a return call to [name] During the contact [name] noted to go from one subject to another with repetition of subjects [facility] RN CM made some unsuccessful attempts to redirect him to the topic being discussed   Social: [name] is at 15 year old single retired male on disability who lives with his mother and sister, [name]. He retired 3 years ago per his mother as he began to have difficulty breathing. He has no children. He has a brother in the navy He has not been in the [employer]. He admits he can read but has difficulty reading and understanding what he reads. He states his mother or sister assists with reading. His mother confirms he can read but does not understand some things he reads. He is independent in his ADLs but needs assist with his iADLs. He does not drive. His 61 year old mother, [name] or sister provides transportation assistance to medical appointments.  He likes to [employer] [name] reports he has called to [facility], applied for services but is not eligible for further assistance for services or medications.  He and his mother voices he has "anger problems" He likes football, mowing lawns and painting  Conditions:COPD per pt- Referral to [facility] reports emphysema, HTN, Coronary atherosclerosis, allergic rhinitis, centrilobular emphysema, nicotine dependence, currentsmoker (he reportsoff andon "cause things get on my nerve"), depression with generalizedanxietydisorder, lymphocytosis, neutropenia, vitamin D deficiency, alcohol addiction  [id number], borrows his mother's BP cuff,eyeglasses, oxygen for prn use (not using)    Appointments: 01/27/20 0800 Dr Odette Fraction care MD14-monthf/u- Pt given details of this appointments  02/17/20 0920 my eye dr 100 professional dr RLinna HoffNC for exam and eyeglasses  08/16/20 0900 Dr SMoshe CiproAnnual wellness Medicare visit with Dr SMoshe CiproRN  Plan [facility] L[facility] CM  will follow up with Nicholas WNegronin 28-35 days as agreed Routed note to MD  TGen[facility] Medical Center AledoCM Care Plan Problem One     Most Recent Value  Care Plan Problem One  Medication insurance coverage affects cost  Role Documenting the Problem One  Care Management Telephonic Coordinator  Care Plan for Problem One  Active  [facility]Long Term Goal   over the next 29 day patietn will have insurance coverage for medication benefit or medication assistance as evidence by documentation in EPIC or patient/family verbalizing during contact  TBedford ParkTerm Goal Start Date  09/04/19  [facility] East Norwegian StreetLong Term Goal Met Date  10/06/19  TPra[facility] ServCM Short Term Goal #1   over the next 14 day patient will work with his nephew, MD or a pharmacy staff to obtain medicare part D or medication assistance as evidence by report of his MD, family or a pharmacist  [facility] CM Short Term Goal #1 Start Date  09/04/19  TGalion [facility] HospitalCM Short Term Goal #1 Met Date  10/06/19    TWest[facility] Surgery CenterCM Care Plan Problem Two     Most Recent Value  Care Plan Problem Two  transportation to medical appointments  Role Documenting the Problem Two  Care Management Telephonic Coordinator  Care Plan for Problem Two  Active  [facility] Long Term Goal  over the next 29 days patient will be able to access local medical transportation to medical appointments as evidence by verbalizaton during contact with patient and mother or noted documentation   [facility] Long Term Goal Start Date  09/04/19  TSt [facility] HospitalLong Term Goal Met Date  10/06/19  [facility] CM Short Term Goal #1   over the next 7 day patient will receive alternate medical transportation options/resouce information as evidence by verbalization during contact  [facility] CM Short Term Goal #1 Start Date  09/04/19  TFl[facility] HospitalCM Short Term Goal #1 Met Date   09/18/19    TMinimally Invasive Surgery Center Of New EnglandCM Care Plan Problem Three     Most Recent Value  Care Plan Problem Three  Behavior health /anxiety  Role Documenting the Problem TNew Salisburyfor Problem Three  Active   [facility] Long Term Goal   over the next 30 days patient will make an effort to work with recommended BTur[facility] Hospitalstaff and taking his medicine as evidence by verbalization and a possible appointment stated during a follow up call  TNormannaTerm Goal Start Date  10/06/19  Interventions for Problem Three Long Term Goal  assessed for [facility]Green[facility] Hospitalcontact, gave BProv[facility] Valdez Medical Centerstaff number again, Attempted a warm transfer to BBay Area Hospitalstaff   TWhiting Forensic HospitalCM Short Term Goal #1   over the next 30 days the patient will call BCommunity [facility] Centers Montpelierstaff recommended for possilble services as verbalized during follow up call  TShriners Hospitals For Children Northern Calif.CM Short Term Goal #1 Start Date  10/21/19  Interventions for Short Term Goal #1  assessed for BCherokee Regional Medical Centercontact, gave BExcela Health Frick Hospitalstaff number again, Attempted a warm transfer to BMartinsburg Va Medical Centerstaff   THenry Ford [facility] Hospital[facility] CampusCM Short Term Goal #2   over the next 30 days patient will get an appointment with an eye  provider as confirmed by the attended appointment and verbalization during follow up call   [facility] CM Short Term Goal #2 Start Date  10/21/19  [facility] CM Short Term Goal #2 Met Date  11/04/19  Interventions for Short Term Goal #2  assessed vision appointment, called [facility] dr to obtain appt for March 2021, gave pt address & contact information      [place]. Lavina Hamman, RN, BSN, Golf Coordinator Office number [phone] Mobile number [phone]  Main [facility] number [phone] Fax number [phone]

## 2019-11-16 ENCOUNTER — Encounter: Payer: Self-pay | Admitting: Orthopedic Surgery

## 2019-11-16 ENCOUNTER — Other Ambulatory Visit: Payer: Self-pay

## 2019-11-16 ENCOUNTER — Ambulatory Visit (INDEPENDENT_AMBULATORY_CARE_PROVIDER_SITE_OTHER): Payer: Medicare Other | Admitting: Orthopedic Surgery

## 2019-11-16 VITALS — BP 188/109 | HR 88 | Ht 71.0 in | Wt 140.0 lb

## 2019-11-16 DIAGNOSIS — M7021 Olecranon bursitis, right elbow: Secondary | ICD-10-CM | POA: Diagnosis not present

## 2019-11-16 NOTE — Progress Notes (Signed)
63 year old male follow-up right elbow bursitis aspirated October reaspirated November 30 treated with prophylactic antibiotics for possible infection  Fluid is come back patient agrees to aspiration  Aspiration completed only got back to cc of fluid recommend excision patient agrees surgery to be arranged for December 10 with a December 30 follow-up  Chief Complaint  Patient presents with  . Elbow Problem    right elbow swelling again     History as noted above  Review of systems negative except for hypertension and occasional shortness of breath requiring inhaler  Past Medical History:  Diagnosis Date  . Alcohol abuse   . Asthma   . COPD (chronic obstructive pulmonary disease) (HCC)   . Coronary atherosclerosis    Based on chest CT 2012  . Essential hypertension, benign   . History of neutropenia    Also lymphocytosis - followed by Dr. Zigmund Daniel  . Nicotine addiction     Past Surgical History:  Procedure Laterality Date  . MULTIPLE TOOTH EXTRACTIONS  June 2012    .[place]  Social History   Tobacco Use  . Smoking status: Current Every Day Smoker    Packs/day: 0.25    Years: 38.00    Pack years: 9.50    Types: Cigarettes    Last attempt to quit: 12/02/2017    Years since quitting: 1.9  . Smokeless tobacco: Never Used  Substance Use Topics  . Alcohol use: No    Alcohol/week: 18.0 standard drinks    Types: 18 Cans of beer per week    Frequency: Never    Comment: quit before Christmas 2018  . Drug use: No    BP (!) 188/109   Pulse 88   Ht 5\' 11"  (1.803 m)   Wt 140 lb (63.5 kg)   BMI 19.53 kg/m   Physical Exam Vitals signs and nursing note reviewed.  Constitutional:      Appearance: Normal appearance.  HENT:     Head: Normocephalic and atraumatic.     Nose: Nose normal. No rhinorrhea.     Mouth/Throat:     Mouth: Mucous membranes are moist.  Eyes:     General: No scleral icterus.    Extraocular Movements: Extraocular movements intact.     Pupils:  Pupils are equal, round, and reactive to light.  Cardiovascular:     Rate and Rhythm: Normal rate.     Pulses: Normal pulses.  Pulmonary:     Effort: Pulmonary effort is normal.  Musculoskeletal:     Comments: Right elbow mass no erythema no tenderness except to deep palpation full range of motion no instability normal strength fluid-filled mass with thickened bursa  Skin:    General: Skin is warm and dry.     Capillary Refill: Capillary refill takes less than 2 seconds.  Neurological:     Mental Status: He is alert and oriented to person, place, and time.     Sensory: No sensory deficit.     Motor: No weakness.     Coordination: Coordination normal.     Gait: Gait normal.     Deep Tendon Reflexes: Reflexes normal.  Psychiatric:        Mood and Affect: Mood normal.        Behavior: Behavior normal.        Thought Content: Thought content normal.        Judgment: Judgment normal.    Encounter Diagnosis  Name Primary?  Olecranon bursitis, right elbow Yes    I attempted  aspiration and successfully got back about 2 cc but patient will need excision and he agrees to surgery  Excision of bursa right elbow

## 2019-11-16 NOTE — Patient Instructions (Signed)
Surgery right elbow   Dec 10 f/u dec 30

## 2019-11-20 NOTE — Patient Instructions (Signed)
[name]  11/20/2019     @   Your procedure is scheduled on  11/26/2019  Report to [facility] at  [phone]  A.M.  Call this number if you have problems the morning of surgery:  [phone]   Remember:  Do not eat or drink after midnight.                        Take these medicines the morning of surgery with A SIP OF WATER  Buspar. Use your inhalers and your nebulizer before you come.    Do not wear jewelry, make-up or nail polish.  Do not wear lotions, powders, or perfumes. Please wear deodorant and brush your teeth.  Do not shave 48 hours prior to surgery.  Men may shave face and neck.  Do not bring valuables to the hospital.  [facility] is not responsible for any belongings or valuables.  Contacts, dentures or bridgework may not be worn into surgery.  Leave your suitcase in the car.  After surgery it may be brought to your room.  For patients admitted to the hospital, discharge time will be determined by your treatment team.  Patients discharged the day of surgery will not be allowed to drive home.   Name and phone number of your driver:   family Special instructions:  None  Please read over the following fact sheets that you were given. Anesthesia Post-op Instructions and Care and Recovery After Surgery       Incision Care, Adult An incision is a cut that a doctor makes in your skin for surgery (for a procedure). Most times, these cuts are closed after surgery. Your cut from surgery may be closed with stitches (sutures), staples, skin glue, or skin tape (adhesive strips). You may need to return to your doctor to have stitches or staples taken out. This may happen many days or many weeks after your surgery. The cut needs to be well cared for so it does not get infected. How to care for your cut Cut care   Follow instructions from your doctor about how to take care of your cut. Make sure you: ? Wash your hands with soap and water  before you change your bandage (dressing). If you cannot use soap and water, use hand sanitizer. ? Change your bandage as told by your doctor. ? Leave stitches, skin glue, or skin tape in place. They may need to stay in place for 2 weeks or longer. If tape strips get loose and curl up, you may trim the loose edges. Do not remove tape strips completely unless your doctor says it is okay.  Check your cut area every day for signs of infection. Check for: ? More redness, swelling, or pain. ? More fluid or blood. ? Warmth. ? Pus or a bad smell.  Ask your doctor how to clean the cut. This may include: ? Using mild soap and water. ? Using a clean towel to pat the cut dry after you clean it. ? Putting a cream or ointment on the cut. Do this only as told by your doctor. ? Covering the cut with a clean bandage.  Ask your doctor when you can leave the cut uncovered.  Do not take baths, swim, or use a hot tub until your doctor says it is okay. Ask your doctor if you can take showers. You may only be allowed to take sponge baths  for bathing. Medicines  If you were prescribed an antibiotic medicine, cream, or ointment, take the antibiotic or put it on the cut as told by your doctor. Do not stop taking or putting on the antibiotic even if your condition gets better.  Take over-the-counter and prescription medicines only as told by your doctor. General instructions  Limit movement around your cut. This helps healing. ? Avoid straining, lifting, or exercise for the first month, or for as long as told by your doctor. ? Follow instructions from your doctor about going back to your normal activities. ? Ask your doctor what activities are safe.  Protect your cut from the sun when you are outside for the first 6 months, or for as long as told by your doctor. Put on sunscreen around the scar or cover up the scar.  Keep all follow-up visits as told by your doctor. This is important. Contact a doctor if:   Your have more redness, swelling, or pain around the cut.  You have more fluid or blood coming from the cut.  Your cut feels warm to the touch.  You have pus or a bad smell coming from the cut.  You have a fever or shaking chills.  You feel sick to your stomach (nauseous) or you throw up (vomit).  You are dizzy.  Your stitches or staples come undone. Get help right away if:  You have a red streak coming from your cut.  Your cut bleeds through the bandage and the bleeding does not stop with gentle pressure.  The edges of your cut open up and separate.  You have very bad (severe) pain.  You have a rash.  You are confused.  You pass out (faint).  You have trouble breathing and you have a fast heartbeat. This information is not intended to replace advice given to you by your health care provider. Make sure you discuss any questions you have with your health care provider. Document Released: 02/25/2012 Document Revised: 04/22/2017 Document Reviewed: 08/10/2016 Elsevier Patient Education  2020 [employer]  Elbow Bursitis Bursitis is swelling and pain at the tip of your elbow. This happens when fluid builds up in a sac under your skin (bursa). This may also be called olecranon bursitis. Follow these instructions at home: Medicines  Take over-the-counter and prescription medicines only as told by your doctor.  If you were prescribed an antibiotic, take it exactly as told by your doctor. Do not stop taking it even if you start to feel better. Managing pain, stiffness, and swelling   If told, put ice on your elbow: ? Put ice in a plastic bag. ? Place a towel between your skin and the bag. ? Leave the ice on for 20 minutes, 2-3 times a day.  If your bursitis is caused by an injury, follow instructions from your doctor about: ? Resting your elbow. ? Wearing a bandage.  Wear elbow pads or elbow wraps as needed. These help cushion your elbow. General instructions   Avoid any activities that cause elbow pain. Ask your doctor what activities are safe for you.  Keep all follow-up visits as told by your doctor. This is important. Contact a doctor if you have:  A fever.  Problems that do not get better with treatment.  Pain or swelling that: ? Gets worse. ? Goes away and then comes back.  Pus draining from your elbow. Get help right away if you have:  Trouble moving your arm, hand, or fingers. Summary  Bursitis is swelling and pain at the tip of the elbow.  You may need to take medicine or put ice on your elbow.  Contact your doctor if your problems do not get better with treatment. This information is not intended to replace advice given to you by your health care provider. Make sure you discuss any questions you have with your health care provider. Document Released: 05/23/2010 Document Revised: 11/15/2017 Document Reviewed: 11/12/2017 Elsevier Patient Education  2020 [employer]. How to Use Chlorhexidine for Bathing Chlorhexidine gluconate (CHG) is a germ-killing (antiseptic) solution that is used to clean the skin. It can get rid of the bacteria that normally live on the skin and can keep them away for about 24 hours. To clean your skin with CHG, you may be given:  A CHG solution to use in the shower or as part of a sponge bath.  A prepackaged cloth that contains CHG. Cleaning your skin with CHG may help lower the risk for infection:  While you are staying in the intensive care unit of the hospital.  If you have a vascular access, such as a central line, to provide short-term or long-term access to your veins.  If you have a catheter to drain urine from your bladder.  If you are on a ventilator. A ventilator is a machine that helps you breathe by moving air in and out of your lungs.  After surgery. What are the risks? Risks of using CHG include:  A skin reaction.  Hearing loss, if CHG gets in your ears.  Eye injury, if CHG  gets in your eyes and is not rinsed out.  The CHG product catching fire. Make sure that you avoid smoking and flames after applying CHG to your skin. Do not use CHG:  If you have a chlorhexidine allergy or have previously reacted to chlorhexidine.  On babies younger than 31 months of age. How to use CHG solution  Use CHG only as told by your health care provider, and follow the instructions on the label.  Use the full amount of CHG as directed. Usually, this is one bottle. During a shower Follow these steps when using CHG solution during a shower (unless your health care provider gives you different instructions): 1. Start the shower. 2. Use your normal soap and shampoo to wash your face and hair. 3. Turn off the shower or move out of the shower stream. 4. Pour the CHG onto a clean washcloth. Do not use any type of brush or rough-edged sponge. 5. Starting at your neck, lather your body down to your toes. Make sure you follow these instructions: ? If you will be having surgery, pay special attention to the part of your body where you will be having surgery. Scrub this area for at least 1 minute. ? Do not use CHG on your head or face. If the solution gets into your ears or eyes, rinse them well with water. ? Avoid your genital area. ? Avoid any areas of skin that have broken skin, cuts, or scrapes. ? Scrub your back and under your arms. Make sure to wash skin folds. 6. Let the lather sit on your skin for 1-2 minutes or as long as told by your health care provider. 7. Thoroughly rinse your entire body in the shower. Make sure that all body creases and crevices are rinsed well. 8. Dry off with a clean towel. Do not put any substances on your body afterward-such as powder, lotion, or perfume-unless you  are told to do so by your health care provider. Only use lotions that are recommended by the manufacturer. 9. Put on clean clothes or pajamas. 10. If it is the night before your surgery, sleep  in clean sheets.  During a sponge bath Follow these steps when using CHG solution during a sponge bath (unless your health care provider gives you different instructions): 1. Use your normal soap and shampoo to wash your face and hair. 2. Pour the CHG onto a clean washcloth. 3. Starting at your neck, lather your body down to your toes. Make sure you follow these instructions: ? If you will be having surgery, pay special attention to the part of your body where you will be having surgery. Scrub this area for at least 1 minute. ? Do not use CHG on your head or face. If the solution gets into your ears or eyes, rinse them well with water. ? Avoid your genital area. ? Avoid any areas of skin that have broken skin, cuts, or scrapes. ? Scrub your back and under your arms. Make sure to wash skin folds. 4. Let the lather sit on your skin for 1-2 minutes or as long as told by your health care provider. 5. Using a different clean, wet washcloth, thoroughly rinse your entire body. Make sure that all body creases and crevices are rinsed well. 6. Dry off with a clean towel. Do not put any substances on your body afterward-such as powder, lotion, or perfume-unless you are told to do so by your health care provider. Only use lotions that are recommended by the manufacturer. 7. Put on clean clothes or pajamas. 8. If it is the night before your surgery, sleep in clean sheets. How to use CHG prepackaged cloths  Only use CHG cloths as told by your health care provider, and follow the instructions on the label.  Use the CHG cloth on clean, dry skin.  Do not use the CHG cloth on your head or face unless your health care provider tells you to.  When washing with the CHG cloth: ? Avoid your genital area. ? Avoid any areas of skin that have broken skin, cuts, or scrapes. Before surgery Follow these steps when using a CHG cloth to clean before surgery (unless your health care provider gives you different  instructions): 1. Using the CHG cloth, vigorously scrub the part of your body where you will be having surgery. Scrub using a back-and-forth motion for 3 minutes. The area on your body should be completely wet with CHG when you are done scrubbing. 2. Do not rinse. Discard the cloth and let the area air-dry. Do not put any substances on the area afterward, such as powder, lotion, or perfume. 3. Put on clean clothes or pajamas. 4. If it is the night before your surgery, sleep in clean sheets.  For general bathing Follow these steps when using CHG cloths for general bathing (unless your health care provider gives you different instructions). 1. Use a separate CHG cloth for each area of your body. Make sure you wash between any folds of skin and between your fingers and toes. Wash your body in the following order, switching to a new cloth after each step: ? The front of your neck, shoulders, and chest. ? Both of your arms, under your arms, and your hands. ? Your stomach and groin area, avoiding the genitals. ? Your right leg and foot. ? Your left leg and foot. ? The back of your neck, your  back, and your buttocks. 2. Do not rinse. Discard the cloth and let the area air-dry. Do not put any substances on your body afterward-such as powder, lotion, or perfume-unless you are told to do so by your health care provider. Only use lotions that are recommended by the manufacturer. 3. Put on clean clothes or pajamas. Contact a health care provider if:  Your skin gets irritated after scrubbing.  You have questions about using your solution or cloth. Get help right away if:  Your eyes become very red or swollen.  Your eyes itch badly.  Your skin itches badly and is red or swollen.  Your hearing changes.  You have trouble seeing.  You have swelling or tingling in your mouth or throat.  You have trouble breathing.  You swallow any chlorhexidine. Summary  Chlorhexidine gluconate (CHG) is a  germ-killing (antiseptic) solution that is used to clean the skin. Cleaning your skin with CHG may help to lower your risk for infection.  You may be given CHG to use for bathing. It may be in a bottle or in a prepackaged cloth to use on your skin. Carefully follow your health care provider's instructions and the instructions on the product label.  Do not use CHG if you have a chlorhexidine allergy.  Contact your health care provider if your skin gets irritated after scrubbing. This information is not intended to replace advice given to you by your health care provider. Make sure you discuss any questions you have with your health care provider. Document Released: 08/27/2012 Document Revised: 02/19/2019 Document Reviewed: 10/31/2017 Elsevier Patient Education  2020 [employer].

## 2019-11-23 ENCOUNTER — Encounter (HOSPITAL_COMMUNITY): Payer: Self-pay

## 2019-11-23 ENCOUNTER — Encounter (HOSPITAL_COMMUNITY)
Admission: RE | Admit: 2019-11-23 | Discharge: 2019-11-23 | Disposition: A | Discharge status: Home / Self Care | Payer: Medicare Other | Source: Ambulatory Visit | Attending: Orthopedic Surgery | Admitting: Orthopedic Surgery

## 2019-11-23 ENCOUNTER — Other Ambulatory Visit: Payer: Self-pay

## 2019-11-23 DIAGNOSIS — Z01812 Encounter for preprocedural laboratory examination: Secondary | ICD-10-CM | POA: Insufficient documentation

## 2019-11-23 LAB — CBC WITH DIFFERENTIAL/PLATELET
Abs Immature Granulocytes: 0.09 10*3/uL — ABNORMAL HIGH (ref 0.00–0.07)
Basophils Absolute: 0.1 10*3/uL (ref 0.0–0.1)
Basophils Relative: 1 %
Eosinophils Absolute: 0.1 10*3/uL (ref 0.0–0.5)
Eosinophils Relative: 2 %
HCT: 48 % (ref 39.0–52.0)
Hemoglobin: 15.6 g/dL (ref 13.0–17.0)
Immature Granulocytes: 1 %
Lymphocytes Relative: 28 %
Lymphs Abs: 1.9 10*3/uL (ref 0.7–4.0)
MCH: 29.3 pg (ref 26.0–34.0)
MCHC: 32.5 g/dL (ref 30.0–36.0)
MCV: 90.2 fL (ref 80.0–100.0)
Monocytes Absolute: 1.2 10*3/uL — ABNORMAL HIGH (ref 0.1–1.0)
Monocytes Relative: 17 %
Neutro Abs: 3.5 10*3/uL (ref 1.7–7.7)
Neutrophils Relative %: 51 %
Platelets: 223 10*3/uL (ref 150–400)
RBC: 5.32 MIL/uL (ref 4.22–5.81)
RDW: 13.1 % (ref 11.5–15.5)
WBC: 6.8 10*3/uL (ref 4.0–10.5)
nRBC: 0 % (ref 0.0–0.2)

## 2019-11-23 LAB — BASIC METABOLIC PANEL
Anion gap: 9 (ref 5–15)
BUN: 18 mg/dL (ref 6–20)
CO2: 23 mmol/L (ref 22–32)
Calcium: 9.2 mg/dL (ref 8.9–10.3)
Chloride: 107 mmol/L (ref 98–111)
Creatinine, Ser: 0.95 mg/dL (ref 0.61–1.24)
GFR calc Af Amer: >60 mL/min (ref >60)
GFR calc non Af Amer: >60 mL/min (ref >60)
Glucose, Bld: 103 mg/dL — ABNORMAL HIGH (ref 70–99)
Potassium: 3.9 mmol/L (ref 3.5–5.1)
Sodium: 139 mmol/L (ref 135–145)

## 2019-11-25 ENCOUNTER — Other Ambulatory Visit (HOSPITAL_COMMUNITY)
Admission: RE | Admit: 2019-11-25 | Discharge: 2019-11-25 | Disposition: A | Discharge status: Home / Self Care | Payer: Medicare Other | Source: Ambulatory Visit | Attending: Orthopedic Surgery | Admitting: Orthopedic Surgery

## 2019-11-25 ENCOUNTER — Other Ambulatory Visit: Payer: Self-pay

## 2019-11-25 DIAGNOSIS — Z01812 Encounter for preprocedural laboratory examination: Secondary | ICD-10-CM | POA: Insufficient documentation

## 2019-11-25 DIAGNOSIS — Z20828 Contact with and (suspected) exposure to other viral communicable diseases: Secondary | ICD-10-CM | POA: Diagnosis not present

## 2019-11-25 LAB — SARS CORONAVIRUS 2 (TAT 6-24 HRS): SARS Coronavirus 2: NEGATIVE

## 2019-11-25 NOTE — H&P (Signed)
62-year-old male follow-up right elbow bursitis aspirated October reaspirated November 30 treated with prophylactic antibiotics for possible infection  Fluid is come back patient agrees to aspiration  Aspiration completed only got back to cc of fluid recommend excision patient agrees surgery to be arranged for December 10 with a December 30 follow-up  Chief Complaint  Patient presents with  . Elbow Problem    right elbow swelling again     History as noted above  Review of systems negative except for hypertension and occasional shortness of breath requiring inhaler  Past Medical History:  Diagnosis Date  . Alcohol abuse   . Asthma   . COPD (chronic obstructive pulmonary disease) (HCC)   . Coronary atherosclerosis    Based on chest CT 2012  . Essential hypertension, benign   . History of neutropenia    Also lymphocytosis - followed by Dr. Formanek  . Nicotine addiction     Past Surgical History:  Procedure Laterality Date  . MULTIPLE TOOTH EXTRACTIONS  June 2012    ./famh  Social History   Tobacco Use  . Smoking status: Current Every Day Smoker    Packs/day: 0.25    Years: 38.00    Pack years: 9.50    Types: Cigarettes    Last attempt to quit: 12/02/2017    Years since quitting: 1.9  . Smokeless tobacco: Never Used  Substance Use Topics  . Alcohol use: No    Alcohol/week: 18.0 standard drinks    Types: 18 Cans of beer per week    Frequency: Never    Comment: quit before Christmas 2018  . Drug use: No    BP (!) 188/109   Pulse 88   Ht 5' 11" (1.803 m)   Wt 140 lb (63.5 kg)   BMI 19.53 kg/m   Physical Exam Vitals signs and nursing note reviewed.  Constitutional:      Appearance: Normal appearance.  HENT:     Head: Normocephalic and atraumatic.     Nose: Nose normal. No rhinorrhea.     Mouth/Throat:     Mouth: Mucous membranes are moist.  Eyes:     General: No scleral icterus.    Extraocular Movements: Extraocular movements intact.     Pupils:  Pupils are equal, round, and reactive to light.  Cardiovascular:     Rate and Rhythm: Normal rate.     Pulses: Normal pulses.  Pulmonary:     Effort: Pulmonary effort is normal.  Musculoskeletal:     Comments: Right elbow mass no erythema no tenderness except to deep palpation full range of motion no instability normal strength fluid-filled mass with thickened bursa  Skin:    General: Skin is warm and dry.     Capillary Refill: Capillary refill takes less than 2 seconds.  Neurological:     Mental Status: He is alert and oriented to person, place, and time.     Sensory: No sensory deficit.     Motor: No weakness.     Coordination: Coordination normal.     Gait: Gait normal.     Deep Tendon Reflexes: Reflexes normal.  Psychiatric:        Mood and Affect: Mood normal.        Behavior: Behavior normal.        Thought Content: Thought content normal.        Judgment: Judgment normal.    Encounter Diagnosis  Name Primary?  . Olecranon bursitis, right elbow Yes    I attempted   aspiration and successfully got back about 2 cc but patient will need excision and he agrees to surgery  Excision of bursa right elbow  

## 2019-11-26 ENCOUNTER — Ambulatory Visit (HOSPITAL_COMMUNITY): Payer: Medicare Other | Admitting: Anesthesiology

## 2019-11-26 ENCOUNTER — Encounter (HOSPITAL_COMMUNITY)
Admission: RE | Disposition: A | Discharge status: Home / Self Care | Payer: Self-pay | Source: Home / Self Care | Attending: Orthopedic Surgery

## 2019-11-26 ENCOUNTER — Ambulatory Visit (HOSPITAL_COMMUNITY)
Admission: RE | Admit: 2019-11-26 | Discharge: 2019-11-26 | Disposition: A | Discharge status: Home / Self Care | Payer: Medicare Other | Attending: Orthopedic Surgery | Admitting: Orthopedic Surgery

## 2019-11-26 DIAGNOSIS — M7021 Olecranon bursitis, right elbow: Secondary | ICD-10-CM

## 2019-11-26 DIAGNOSIS — J449 Chronic obstructive pulmonary disease, unspecified: Secondary | ICD-10-CM | POA: Insufficient documentation

## 2019-11-26 DIAGNOSIS — F17210 Nicotine dependence, cigarettes, uncomplicated: Secondary | ICD-10-CM | POA: Diagnosis not present

## 2019-11-26 DIAGNOSIS — I2510 Atherosclerotic heart disease of native coronary artery without angina pectoris: Secondary | ICD-10-CM | POA: Insufficient documentation

## 2019-11-26 DIAGNOSIS — I10 Essential (primary) hypertension: Secondary | ICD-10-CM | POA: Insufficient documentation

## 2019-11-26 HISTORY — PX: OLECRANON BURSECTOMY: SHX2097

## 2019-11-26 SURGERY — BURSECTOMY, ELBOW
Anesthesia: General | Site: Elbow | Laterality: Right

## 2019-11-26 MED ORDER — IPRATROPIUM-ALBUTEROL 0.5-2.5 (3) MG/3ML IN SOLN
3.0000 mL | Freq: Once | RESPIRATORY_TRACT | Status: AC
  Administered 2019-11-26: 3 mL via RESPIRATORY_TRACT

## 2019-11-26 MED ORDER — MIDAZOLAM HCL 2 MG/2ML IJ SOLN
2.0000 mg | Freq: Once | INTRAMUSCULAR | Status: AC
  Administered 2019-11-26: 2 mg via INTRAVENOUS

## 2019-11-26 MED ORDER — LABETALOL HCL 5 MG/ML IV SOLN
INTRAVENOUS | Status: AC
  Filled 2019-11-26: qty 4

## 2019-11-26 MED ORDER — IPRATROPIUM-ALBUTEROL 0.5-2.5 (3) MG/3ML IN SOLN
RESPIRATORY_TRACT | Status: AC
  Filled 2019-11-26: qty 3

## 2019-11-26 MED ORDER — MIDAZOLAM HCL 2 MG/2ML IJ SOLN
INTRAMUSCULAR | Status: AC
  Filled 2019-11-26: qty 2

## 2019-11-26 MED ORDER — CEFAZOLIN SODIUM-DEXTROSE 2-4 GM/100ML-% IV SOLN
2.0000 g | INTRAVENOUS | Status: AC
  Administered 2019-11-26: 2 g via INTRAVENOUS
  Filled 2019-11-26: qty 100

## 2019-11-26 MED ORDER — SODIUM CHLORIDE 0.9 % IR SOLN
Status: DC | PRN
  Administered 2019-11-26: 1000 mL

## 2019-11-26 MED ORDER — DEXAMETHASONE SODIUM PHOSPHATE 10 MG/ML IJ SOLN
INTRAMUSCULAR | Status: DC | PRN
  Administered 2019-11-26: 5 mg via INTRAVENOUS

## 2019-11-26 MED ORDER — HYDROMORPHONE HCL 1 MG/ML IJ SOLN: 0.2500 mg | INTRAMUSCULAR | Status: DC | PRN

## 2019-11-26 MED ORDER — BUPIVACAINE-EPINEPHRINE (PF) 0.5% -1:200000 IJ SOLN
INTRAMUSCULAR | Status: AC
  Filled 2019-11-26: qty 30

## 2019-11-26 MED ORDER — DEXAMETHASONE SODIUM PHOSPHATE 10 MG/ML IJ SOLN
INTRAMUSCULAR | Status: AC
  Filled 2019-11-26: qty 1

## 2019-11-26 MED ORDER — LABETALOL HCL 5 MG/ML IV SOLN
5.0000 mg | INTRAVENOUS | Status: AC | PRN
  Administered 2019-11-26 (×2): 5 mg via INTRAVENOUS

## 2019-11-26 MED ORDER — BUPIVACAINE-EPINEPHRINE 0.5% -1:200000 IJ SOLN
INTRAMUSCULAR | Status: DC | PRN
  Administered 2019-11-26: 25 mL

## 2019-11-26 MED ORDER — FENTANYL CITRATE (PF) 100 MCG/2ML IJ SOLN
INTRAMUSCULAR | Status: DC | PRN
  Administered 2019-11-26 (×2): 100 ug via INTRAVENOUS

## 2019-11-26 MED ORDER — LACTATED RINGERS IV SOLN
INTRAVENOUS | Status: DC | PRN
  Administered 2019-11-26: 07:00:00 via INTRAVENOUS

## 2019-11-26 MED ORDER — LABETALOL HCL 5 MG/ML IV SOLN
10.0000 mg | INTRAVENOUS | Status: DC | PRN
  Administered 2019-11-26: 10 mg via INTRAVENOUS

## 2019-11-26 MED ORDER — ONDANSETRON HCL 4 MG/2ML IJ SOLN
INTRAMUSCULAR | Status: AC
  Filled 2019-11-26: qty 2

## 2019-11-26 MED ORDER — PROPOFOL 10 MG/ML IV BOLUS
INTRAVENOUS | Status: AC
  Filled 2019-11-26: qty 20

## 2019-11-26 MED ORDER — ACETAMINOPHEN-CODEINE #3 300-30 MG PO TABS: 1.0000 | ORAL_TABLET | Freq: Four times a day (QID) | ORAL | 0 refills | Status: DC | PRN

## 2019-11-26 MED ORDER — CHLORHEXIDINE GLUCONATE 4 % EX LIQD: 60.0000 mL | Freq: Once | CUTANEOUS | Status: DC

## 2019-11-26 MED ORDER — PROPOFOL 10 MG/ML IV BOLUS
INTRAVENOUS | Status: DC | PRN
  Administered 2019-11-26: 200 mg via INTRAVENOUS

## 2019-11-26 MED ORDER — LIDOCAINE HCL (CARDIAC) PF 100 MG/5ML IV SOSY
PREFILLED_SYRINGE | INTRAVENOUS | Status: DC | PRN
  Administered 2019-11-26: 100 mg via INTRAVENOUS

## 2019-11-26 MED ORDER — LIDOCAINE 2% (20 MG/ML) 5 ML SYRINGE
INTRAMUSCULAR | Status: AC
  Filled 2019-11-26: qty 5

## 2019-11-26 MED ORDER — FENTANYL CITRATE (PF) 100 MCG/2ML IJ SOLN
INTRAMUSCULAR | Status: AC
  Filled 2019-11-26: qty 2

## 2019-11-26 MED ORDER — ONDANSETRON HCL 4 MG/2ML IJ SOLN
4.0000 mg | Freq: Once | INTRAMUSCULAR | Status: AC | PRN
  Administered 2019-11-26: 4 mg via INTRAVENOUS

## 2019-11-26 MED ORDER — LACTATED RINGERS IV SOLN
Freq: Once | INTRAVENOUS | Status: AC
  Administered 2019-11-26: 07:00:00 via INTRAVENOUS

## 2019-11-26 SURGICAL SUPPLY — 46 items
APL PRP STRL LF DISP 70% ISPRP (MISCELLANEOUS) ×1
BANDAGE ELASTIC 4 VELCRO NS (GAUZE/BANDAGES/DRESSINGS) ×1 IMPLANT
BANDAGE ESMARK 4X12 BL STRL LF (DISPOSABLE) ×1 IMPLANT
BNDG CMPR 12X4 ELC STRL LF (DISPOSABLE) ×1
BNDG CMPR STD VLCR NS LF 5.8X4 (GAUZE/BANDAGES/DRESSINGS) ×1
BNDG COHESIVE 4X5 TAN STRL (GAUZE/BANDAGES/DRESSINGS) ×1 IMPLANT
BNDG ELASTIC 4X5.8 VLCR NS LF (GAUZE/BANDAGES/DRESSINGS) ×2 IMPLANT
BNDG ESMARK 4X12 BLUE STRL LF (DISPOSABLE) ×2
BNDG GAUZE ELAST 4 BULKY (GAUZE/BANDAGES/DRESSINGS) ×2 IMPLANT
CHLORAPREP W/TINT 26 (MISCELLANEOUS) ×2 IMPLANT
CLOTH BEACON ORANGE TIMEOUT ST (SAFETY) ×2 IMPLANT
COVER LIGHT HANDLE STERIS (MISCELLANEOUS) ×4 IMPLANT
CUFF TOURN SGL QUICK 18X4 (TOURNIQUET CUFF) ×2 IMPLANT
DECANTER SPIKE VIAL GLASS SM (MISCELLANEOUS) ×2 IMPLANT
ELECT REM PT RETURN 9FT ADLT (ELECTROSURGICAL) ×2
ELECTRODE REM PT RTRN 9FT ADLT (ELECTROSURGICAL) ×1 IMPLANT
GAUZE SPONGE 4X4 12PLY STRL (GAUZE/BANDAGES/DRESSINGS) ×1 IMPLANT
GAUZE XEROFORM 5X9 LF (GAUZE/BANDAGES/DRESSINGS) ×2 IMPLANT
GLOVE BIO SURGEON STRL SZ7 (GLOVE) ×2 IMPLANT
GLOVE BIOGEL PI IND STRL 7.0 (GLOVE) ×3 IMPLANT
GLOVE BIOGEL PI INDICATOR 7.0 (GLOVE) ×3
GLOVE SKINSENSE NS SZ8.0 LF (GLOVE) ×1
GLOVE SKINSENSE STRL SZ8.0 LF (GLOVE) ×1 IMPLANT
GLOVE SS N UNI LF 8.5 STRL (GLOVE) ×2 IMPLANT
GOWN STRL REUS W/ TWL XL LVL3 (GOWN DISPOSABLE) ×1 IMPLANT
GOWN STRL REUS W/TWL LRG LVL3 (GOWN DISPOSABLE) ×2 IMPLANT
GOWN STRL REUS W/TWL XL LVL3 (GOWN DISPOSABLE) ×4 IMPLANT
INST SET MINOR BONE (KITS) ×2 IMPLANT
KIT TURNOVER KIT A (KITS) ×2 IMPLANT
MANIFOLD NEPTUNE II (INSTRUMENTS) ×2 IMPLANT
NDL HYPO 21X1.5 SAFETY (NEEDLE) ×1 IMPLANT
NEEDLE HYPO 21X1.5 SAFETY (NEEDLE) ×2 IMPLANT
NS IRRIG 1000ML POUR BTL (IV SOLUTION) ×2 IMPLANT
PACK BASIC LIMB (CUSTOM PROCEDURE TRAY) ×2 IMPLANT
PAD ABD 5X9 TENDERSORB (GAUZE/BANDAGES/DRESSINGS) ×1 IMPLANT
PAD ARMBOARD 7.5X6 YLW CONV (MISCELLANEOUS) ×2 IMPLANT
PAD CAST 4YDX4 CTTN HI CHSV (CAST SUPPLIES) ×1 IMPLANT
PADDING CAST COTTON 4X4 STRL (CAST SUPPLIES) ×2
SET BASIN LINEN APH (SET/KITS/TRAYS/PACK) ×2 IMPLANT
SLING ARM FOAM STRAP LRG (SOFTGOODS) ×1 IMPLANT
SPONGE LAP 18X18 RF (DISPOSABLE) ×2 IMPLANT
STAPLER VISISTAT 35W (STAPLE) ×1 IMPLANT
SUT MON AB 2-0 SH 27 (SUTURE) ×2
SUT MON AB 2-0 SH27 (SUTURE) ×1 IMPLANT
SYR 30ML LL (SYRINGE) ×2 IMPLANT
SYR BULB IRRIGATION 50ML (SYRINGE) ×2 IMPLANT

## 2019-11-26 NOTE — Discharge Instructions (Signed)
Monitored Anesthesia Care, Care After °These instructions provide you with information about caring for yourself after your procedure. Your health care provider may also give you more specific instructions. Your treatment has been planned according to current medical practices, but problems sometimes occur. Call your health care provider if you have any problems or questions after your procedure. °What can I expect after the procedure? °After your procedure, you may: °· Feel sleepy for several hours. °· Feel clumsy and have poor balance for several hours. °· Feel forgetful about what happened after the procedure. °· Have poor judgment for several hours. °· Feel nauseous or vomit. °· Have a sore throat if you had a breathing tube during the procedure. °Follow these instructions at home: °For at least 24 hours after the procedure: ° °  ° °· Have a responsible adult stay with you. It is important to have someone help care for you until you are awake and alert. °· Rest as needed. °· Do not: °? Participate in activities in which you could fall or become injured. °? Drive. °? Use heavy machinery. °? Drink alcohol. °? Take sleeping pills or medicines that cause drowsiness. °? Make important decisions or sign legal documents. °? Take care of children on your own. °Eating and drinking °· Follow the diet that is recommended by your health care provider. °· If you vomit, drink water, juice, or soup when you can drink without vomiting. °· Make sure you have little or no nausea before eating solid foods. °General instructions °· Take over-the-counter and prescription medicines only as told by your health care provider. °· If you have sleep apnea, surgery and certain medicines can increase your risk for breathing problems. Follow instructions from your health care provider about wearing your sleep device: °? Anytime you are sleeping, including during daytime naps. °? While taking prescription pain medicines, sleeping medicines,  or medicines that make you drowsy. °· If you smoke, do not smoke without supervision. °· Keep all follow-up visits as told by your health care provider. This is important. °Contact a health care provider if: °· You keep feeling nauseous or you keep vomiting. °· You feel light-headed. °· You develop a rash. °· You have a fever. °Get help right away if: °· You have trouble breathing. °Summary °· For several hours after your procedure, you may feel sleepy and have poor judgment. °· Have a responsible adult stay with you for at least 24 hours or until you are awake and alert. °This information is not intended to replace advice given to you by your health care provider. Make sure you discuss any questions you have with your health care provider. °Document Released: 03/25/2016 Document Revised: 03/03/2018 Document Reviewed: 03/25/2016 °Elsevier Patient Education © 2020 Elsevier Inc. ° °

## 2019-11-26 NOTE — Op Note (Signed)
11/26/2019  8:22 AM  PATIENT:  [name]  63 y.o. male  PRE-OPERATIVE DIAGNOSIS:  olecranon bursitis right elbow  POST-OPERATIVE DIAGNOSIS:  olecranon bursitis right elbow  PROCEDURE:  Procedure(s) with comments: OLECRANON BURSECTOMY (Right) - pt knows to arrive at 6:15   Findings 5 x 5 olecranon bursal sac with fibrinous yellow fibrous type tissue and bursal fluid  SURGEON:  Surgeon(s) and Role:    * Carole Civil, MD - Primary  The patient was seen in preop and correctly identified in the chart was updated the surgical site was marked he was taken to the operating room he was given the appropriate amount of Ancef and general anesthesia was administered  He was in supine position with a tourniquet on his right arm  After sterile prep and drape a timeout was completed and the surgery was started  A longitudinal incision was made over the bursal sac the subcutaneous tissue was divided the sac was excised with sharp and blunt dissection taking care to preserve the skin.  There was fluid in the sac and there was fibrinous material in the sac  After thorough irrigation the wound was closed with interrupted 2-0 Monocryl sutures and staples.  We then injected 25 cc of Marcaine half percent with epinephrine  A sterile dressing was applied the tourniquet was released the patient was extubated and taken to recovery in stable condition  PHYSICIAN ASSISTANT:   ASSISTANTS: Fulton Mole  ANESTHESIA:   general  EBL:  2 mL   BLOOD ADMINISTERED:none  DRAINS: none   LOCAL MEDICATIONS USED:  MARCAINE    and Amount: Marcaine half percent with epinephrine 25 cc ml  SPECIMEN:  Source of Specimen:  Right elbow olecranon bursa  DISPOSITION OF SPECIMEN:  PATHOLOGY  COUNTS:  YES  TOURNIQUET:   Total Tourniquet Time Documented: Upper Arm (Right) - 20 minutes Total: Upper Arm (Right) - 20 minutes   DICTATION: .Dragon Dictation  PLAN OF CARE: Discharge to home after  PACU  PATIENT DISPOSITION:  PACU - hemodynamically stable.   Delay start of Pharmacological VTE agent (>24hrs) due to surgical blood loss or risk of bleeding: not applicable

## 2019-11-26 NOTE — Anesthesia Preprocedure Evaluation (Addendum)
Anesthesia Evaluation  Patient identified by MRN, date of birth, ID band Patient awake    Reviewed: Allergy & Precautions, NPO status , Patient's Chart, lab work & pertinent test results, reviewed documented beta blocker date and time   Airway Mallampati: II  TM Distance: >3 FB Neck ROM: Full    Dental  (+) Edentulous Upper, Edentulous Lower   Pulmonary asthma , COPD,  COPD inhaler, Current Smoker and Patient abstained from smoking.,    Pulmonary exam normal breath sounds clear to auscultation       Cardiovascular METS: 3 - Mets hypertension, Pt. on medications + CAD  Normal cardiovascular exam Rhythm:Regular Rate:Normal  29-Jul-2019 09:42:16 [facility]  Normal sinus rhythm Minimal voltage criteria for LVH, may be normal variant Nonspecific T wave abnormality Abnormal ECG   Neuro/Psych PSYCHIATRIC DISORDERS Anxiety Depression    GI/Hepatic (+)     substance abuse  alcohol use and marijuana use,   Endo/Other    Renal/GU      Musculoskeletal   Abdominal   Peds  Hematology   Anesthesia Other Findings   Reproductive/Obstetrics                           Anesthesia Physical Anesthesia Plan  ASA: III  Anesthesia Plan: General   Post-op Pain Management:    Induction: Intravenous  PONV Risk Score and Plan: 2 and Ondansetron, Dexamethasone and Midazolam  Airway Management Planned: LMA  Additional Equipment:   Intra-op Plan:   Post-operative Plan: Extubation in OR  Informed Consent: I have reviewed the patients History and Physical, chart, labs and discussed the procedure including the risks, benefits and alternatives for the proposed anesthesia with the patient or authorized representative who has indicated his/her understanding and acceptance.       Plan Discussed with: CRNA  Anesthesia Plan Comments:         Anesthesia Quick Evaluation

## 2019-11-26 NOTE — Anesthesia Postprocedure Evaluation (Signed)
Anesthesia Post Note  Patient: [name]  Procedure(s) Performed: Ree Shay BURSECTOMY (Right Elbow)  Patient location during evaluation: PACU Anesthesia Type: General Level of consciousness: awake, oriented, awake and alert and patient cooperative Pain management: pain level controlled Vital Signs Assessment: post-procedure vital signs reviewed and stable Respiratory status: spontaneous breathing, respiratory function stable, nonlabored ventilation and patient connected to face mask oxygen Cardiovascular status: stable Postop Assessment: no apparent nausea or vomiting Anesthetic complications: no     Last Vitals:  Vitals:   11/26/19 0718 11/26/19 0729  BP: (!) 174/112 (!) 161/111  Pulse: 86 90  Resp:  (!) 22  Temp:    SpO2:  100%    Last Pain:  Vitals:   11/26/19 0655  TempSrc: Oral  PainSc: 0-No pain                 Deundra Bard

## 2019-11-26 NOTE — Anesthesia Procedure Notes (Signed)
Procedure Name: LMA Insertion Date/Time: 11/26/2019 7:40 AM Performed by: Jonna Munro, CRNA Pre-anesthesia Checklist: Patient identified, Emergency Drugs available, Suction available, Patient being monitored and Timeout performed Patient Re-evaluated:Patient Re-evaluated prior to induction Oxygen Delivery Method: Circle system utilized Preoxygenation: Pre-oxygenation with 100% oxygen Induction Type: IV induction LMA: LMA inserted LMA Size: 4.0 Number of attempts: 1 Dental Injury: Teeth and Oropharynx as per pre-operative assessment

## 2019-11-26 NOTE — Brief Op Note (Signed)
11/26/2019  8:22 AM  PATIENT:  [name]  62 y.o. male  PRE-OPERATIVE DIAGNOSIS:  olecranon bursitis right elbow  POST-OPERATIVE DIAGNOSIS:  olecranon bursitis right elbow  PROCEDURE:  Procedure(s) with comments: OLECRANON BURSECTOMY (Right) - pt knows to arrive at 6:15   Findings 5 x 5 olecranon bursal sac with fibrinous yellow fibrous type tissue and bursal fluid  SURGEON:  Surgeon(s) and Role:    * Basir Niven E, MD - Primary  The patient was seen in preop and correctly identified in the chart was updated the surgical site was marked he was taken to the operating room he was given the appropriate amount of Ancef and general anesthesia was administered  He was in supine position with a tourniquet on his right arm  After sterile prep and drape a timeout was completed and the surgery was started  A longitudinal incision was made over the bursal sac the subcutaneous tissue was divided the sac was excised with sharp and blunt dissection taking care to preserve the skin.  There was fluid in the sac and there was fibrinous material in the sac  After thorough irrigation the wound was closed with interrupted 2-0 Monocryl sutures and staples.  We then injected 25 cc of Marcaine half percent with epinephrine  A sterile dressing was applied the tourniquet was released the patient was extubated and taken to recovery in stable condition  PHYSICIAN ASSISTANT:   ASSISTANTS: Cynthia Wren  ANESTHESIA:   general  EBL:  2 mL   BLOOD ADMINISTERED:none  DRAINS: none   LOCAL MEDICATIONS USED:  MARCAINE    and Amount: Marcaine half percent with epinephrine 25 cc ml  SPECIMEN:  Source of Specimen:  Right elbow olecranon bursa  DISPOSITION OF SPECIMEN:  PATHOLOGY  COUNTS:  YES  TOURNIQUET:   Total Tourniquet Time Documented: Upper Arm (Right) - 20 minutes Total: Upper Arm (Right) - 20 minutes   DICTATION: .Dragon Dictation  PLAN OF CARE: Discharge to home after  PACU  PATIENT DISPOSITION:  PACU - hemodynamically stable.   Delay start of Pharmacological VTE agent (>24hrs) due to surgical blood loss or risk of bleeding: not applicable  

## 2019-11-26 NOTE — Transfer of Care (Signed)
Immediate Anesthesia Transfer of Care Note  Patient: CORDELL GUERCIO  Procedure(s) Performed: Ree Shay BURSECTOMY (Right Elbow)  Patient Location: PACU  Anesthesia Type:General  Level of Consciousness: awake, alert , oriented and patient cooperative  Airway & Oxygen Therapy: Patient Spontanous Breathing and Patient connected to face mask oxygen  Post-op Assessment: Report given to RN, Post -op Vital signs reviewed and stable and Patient moving all extremities X 4  Post vital signs: Reviewed and stable  Last Vitals:  Vitals Value Taken Time  BP    Temp    Pulse 94 11/26/19 0824  Resp    SpO2 82 % 11/26/19 0824  Vitals shown include unvalidated device data.  Last Pain:  Vitals:   11/26/19 0655  TempSrc: Oral  PainSc: 0-No pain      Patients Stated Pain Goal: 5 (20/23/34 3568)  Complications: No apparent anesthesia complications

## 2019-11-26 NOTE — Interval H&P Note (Signed)
History and Physical Interval Note:  11/26/2019 7:24 AM  [name]  has presented today for surgery, with the diagnosis of olecranon bursitis right elbow.  The various methods of treatment have been discussed with the patient and family. After consideration of risks, benefits and other options for treatment, the patient has consented to  Procedure(s) with comments: OLECRANON BURSECTOMY (Right) - pt knows to arrive at 6:15 as a surgical intervention.  The patient's history has been reviewed, patient examined, no change in status, stable for surgery.  I have reviewed the patient's chart and labs.  Questions were answered to the patient's satisfaction.     Arther Abbott

## 2019-11-27 LAB — SURGICAL PATHOLOGY

## 2019-12-02 ENCOUNTER — Other Ambulatory Visit: Payer: Self-pay

## 2019-12-02 ENCOUNTER — Other Ambulatory Visit: Payer: Self-pay | Admitting: *Deleted

## 2019-12-02 NOTE — Patient Outreach (Signed)
[facility]) Care Management  12/02/2019  [name] July 02, 1956 [id number]   Telephone Screen/further assessment and follow up Referral Date:09/04/19 Referral Source:MD referral -Dr Tula Nakayama office Referral Reason:pt has medicare but can't afford medications does not have drug coverage- has emphysema  Insurance: NextGen Medicare/medicaid of [place] family planningeffective 11/7/2018SSI disability  Humana medicare effective 10/06/19   [name] had only an outpatient surgery in a [facility] facility on December 2020  No ED visits or hospital admissions   Outreach attempt successful Patient is able to verify HIPAA, DOB and address Reviewed and addressedreason for follow up He gave permission for all [facility] staff to speak with his mother, [name] as needed  Covid- He was tested negative prior to his outpatient surgery. He reports tolerating the test very well [name] shared he has had to family members pass with covid and several to be hospitalized and released. He denies close contact with covid positive persons, follows the 3 Ws (washing hands, wearing masks and waiting 6 feet from others)  Right elbow abscess- He reports "My goose egg ws taken out. I got about twenty two staples in it" by Dr Aline Brochure He reports "soreness. It still hurts" of the arm only He reports use of his pain medication every "four hours" He denies swelling or drainage to the site. Follow up with Dr Aline Brochure  COPD [name] reports prior to his outpatient surgery he was informed by the anesthesiologist that wheezing was noted and he was given a nebulizer prior to the anesthesia. [facility] RN CM discussed his use of inhalers. He still reports one at a cost of $22 that he has not addressed with Dr Moshe Cipro office as he would like a lower cost inhaler called into his pharmacy. He reports only prn use of his inhaler and was encouraged to use more frequently  He was encouraged and an  explanation provided for how he can call Dr Moshe Cipro office to discuss the inhaler concern vs waiting for his 02/07/20 appointment to discuss it He voiced understanding   Eyeglasses Reminded him of his February 17 2020 0920 appointment after he reported he had to put on his store brought glasses  Behavioral health [name] has not called [facility]va at [phone] as he was requested  Today He reports he wrote the number down wrong  [facility] RN CM gave the number to him again He then allowed [facility] RN CM to speak with his mother, [name] who repeated what he wrote down and the number [name] wrote down was wrong. Mrs [name] was again given the correct number and she repeated the number correctly. [facility] RN CM discussed again the importance of [name] outreach to Ava, [place]  Social: [name] is at 63 year old single retired male on disability who lives with his mother and sister, [name]. His father has been deceased for 9 years. He retired 3 years ago per his mother as he began to have difficulty breathing. He has no children. He has a brother in the navy He has not been in the [employer]. He admits hecan read buthas difficulty readingand understanding what he reads. He states his mother or sister assists with reading. His mother confirms he can read but does not understand some things he reads. He is independent in his ADLs but needs assist with his iADLs. He does not drive. His 55 year old mother, Myrtleor sisterprovides transportation assistance to medical appointments.He likes to [employer] [name] reports  he has called to [facility], applied for services but is not eligible for further assistance for services or medications.  He and his mother voices he has "anger problems" He likes football, mowing lawns and painting  Conditions:COPD per pt- Referral to [facility] reports emphysema, HTN, Coronary atherosclerosis, allergic rhinitis, centrilobular emphysema, nicotine dependence, currentsmoker (he  reportsoff andon "cause things get on my nerve"), depression with generalizedanxietydisorder, lymphocytosis, neutropenia, vitamin D deficiency, alcohol addiction  [id number], borrows his mother's BP cuff,eyeglasses, oxygen for prn use (not using)    Appointments: 12/16/19 1140 f/u with Dr Aline Brochure  01/27/20 0800 Dr Odette Fraction care MD13-monthf/u- Pt given details of this appointments  02/17/20 0920 my eye dr 100 professional dr RLinna HoffNC for exam and eyeglasses  08/16/20 0900 Dr SMoshe CiproAnnual wellness Medicare visit with Dr SMoshe CiproRN  Plan [facility] HospitalRN CM will follow up with Nicholas WLippmannin 14-21 days as agreed to check on his call to [facility]and his progression after surgery Routed note to MD  TPinn[facility] Hospital IncCM Care Plan Problem One     Most Recent Value  Care Plan Problem One  Medication insurance coverage affects cost  Role Documenting the Problem One  Care Management Telephonic Coordinator  Care Plan for Problem One  Active  [facility] HospitalLong Term Goal   over the next 29 day patietn will have insurance coverage for medication benefit or medication assistance as evidence by documentation in EPIC or patient/family verbalizing during contact  TLarchwoodGoal Start Date  09/04/19  TTyler [name] Memorial HospitalLong Term Goal Met Date  10/06/19  TNorth [facility] HamiltonCM Short Term Goal #1   over the next 14 day patient will work with his nephew, MD or a pharmacy staff to obtain medicare part D or medication assistance as evidence by report of his MD, family or a pharmacist  TA[facility] HospitalCM Short Term Goal #1 Start Date  09/04/19  [facility]CM Short Term Goal #1 Met Date  10/06/19    TLog[facility] HospitalCM Care Plan Problem Two     Most Recent Value  Care Plan Problem Two  transportation to medical appointments  Role Documenting the Problem Two  Care Management Telephonic Coordinator  Care Plan for Problem Two  Active  [facility]N Long Term Goal  over the next 29 days patient will be able to access local medical transportation to medical appointments as evidence by  verbalizaton during contact with patient and mother or noted documentation   THN Long Term Goal Start Date  09/04/19  TEndoscopy Center Of Niagara LLCLong Term Goal Met Date  10/06/19  TGov [facility] Hospital [facility]trCM Short Term Goal #1   over the next 7 day patient will receive alternate medical transportation options/resouce information as evidence by verbalization during contact  THN CM Short Term Goal #1 Start Date  09/04/19  TFreeman Neosho HospitalCM Short Term Goal #1 Met Date   09/18/19    TBrighton Surgical Center IncCM Care Plan Problem Three     Most Recent Value  Care Plan Problem Three  Behavior health /anxiety  Role Documenting the Problem Three  Care Management Telephonic Coordinator  Care Plan for Problem Three  Active  THN Long Term Goal   over the next 30 days patient will make an effort to work with recommended [facility]New [facility] Centerstaff and taking his medicine as evidence by verbalization and a possible appointment stated during a follow up call  TPondsvilleTerm Goal Start Date  10/06/19  Interventions for Problem Three Long Term Goal  assessed for BThe [facility] For Sight Paoutreach to Ava, gave number to  him and his mother again, encouraged contact to [facility], discussed the importance of [place] Term Goal #1   over the next 30 days the patient will call [facility] staff recommended for possilble services as verbalized during follow up call  THN CM Short Term Goal #1 Start Date  10/21/19  [facility] CM Short Term Goal #2   over the next 30 days patient will get an appointment with an eye provider as confirmed by the attended appointment and verbalization during follow up call   [facility] CM Short Term Goal #2 Start Date  10/21/19  [facility] CM Short Term Goal #2 Met Date  11/04/19     Joelene Millin L. Lavina Hamman, RN, BSN, [place] Coordinator Office number [phone] Mobile number [phone]  Main [facility] number [phone] Fax number [phone]

## 2019-12-04 ENCOUNTER — Ambulatory Visit (INDEPENDENT_AMBULATORY_CARE_PROVIDER_SITE_OTHER): Payer: Medicare Other | Admitting: Orthopedic Surgery

## 2019-12-04 ENCOUNTER — Other Ambulatory Visit: Payer: Self-pay

## 2019-12-04 VITALS — BP 130/84 | HR 87 | Temp 97.4°F | Ht 66.0 in | Wt 148.0 lb

## 2019-12-04 DIAGNOSIS — M7021 Olecranon bursitis, right elbow: Secondary | ICD-10-CM

## 2019-12-04 DIAGNOSIS — Z9889 Other specified postprocedural states: Secondary | ICD-10-CM

## 2019-12-04 NOTE — Progress Notes (Signed)
Postop visit #1 status post removal of olecranon bursitis right elbow on December 10  His wound looks good we placed him back in an Ace wrap he will follow up for staple removal on the 23rd

## 2019-12-04 NOTE — Patient Instructions (Signed)
Change appt from dec 30 to dec 23

## 2019-12-09 ENCOUNTER — Other Ambulatory Visit: Payer: Self-pay

## 2019-12-09 ENCOUNTER — Encounter: Payer: Self-pay | Admitting: Orthopedic Surgery

## 2019-12-09 ENCOUNTER — Ambulatory Visit (INDEPENDENT_AMBULATORY_CARE_PROVIDER_SITE_OTHER): Payer: Medicare Other | Admitting: Orthopedic Surgery

## 2019-12-09 ENCOUNTER — Telehealth: Payer: Self-pay | Admitting: Orthopedic Surgery

## 2019-12-09 DIAGNOSIS — M7021 Olecranon bursitis, right elbow: Secondary | ICD-10-CM

## 2019-12-09 NOTE — Progress Notes (Signed)
Postop visit status post remove olecranon bursa right elbow December 10  Came in for staples to be removed which they were his wound looks fine his motion is excellent there are no signs of infection we released him follow-up as needed

## 2019-12-09 NOTE — Telephone Encounter (Signed)
We called patient at 9:07am this morning regarding his appointment with Dr Aline Brochure. He and wife said 'it was changed to next week.'  We reviewed patient's post op and after visit instructions, and although it was initially scheduled for 12/16/19; Dr Aline Brochure requested at last visit to move it up to today, 12/09/19. Patient will still come in - moved into cancellation time slot. Aware.

## 2019-12-16 ENCOUNTER — Ambulatory Visit: Payer: Medicare Other | Admitting: Orthopedic Surgery

## 2019-12-16 ENCOUNTER — Other Ambulatory Visit: Payer: Self-pay | Admitting: *Deleted

## 2019-12-16 NOTE — Patient Outreach (Signed)
  [facility]) Care Management  12/16/2019  KOLLEN ARMENTI [birth year] [id number]   Telephone Screen/further assessment and follow up Referral Date:09/04/19 Referral Source:MD referral -Dr Tula Nakayama office Referral Reason:pt has medicare but can't afford medications does not have drug coverage- has emphysema  Insurance: [place] family planningeffective 11/7/2018SSI disability  Humana medicare effective 10/06/19   Mr Laduke had only an outpatient surgery in a [facility] facility on December 2020  No ED visits or hospital admissions   Outreach attempt # 1 to [facility] engaged patient unsuccessful  Male answered and reports Mr Shidler is asleep. [facility] RN CM left HIPAA compliant voicemail message along with CM's contact info.   Plan: [facility] RN CM scheduled this patient for another call attempt within 4 business days  Octa Uplinger L. Lavina Hamman, RN, BSN, [place] Coordinator Office number [phone] Mobile number [phone]  Main [facility] number [phone] Fax number [phone]

## 2019-12-21 ENCOUNTER — Other Ambulatory Visit: Payer: Self-pay | Admitting: *Deleted

## 2019-12-21 ENCOUNTER — Other Ambulatory Visit: Payer: Self-pay

## 2019-12-21 NOTE — Patient Outreach (Signed)
[place] [facility]) Care Management  12/21/2019  EPHRIAM TURMAN Nov 21, 1956 [id number]   Telephone Screen/further assessment and follow up Referral Date:09/04/19 Referral Source:MD referral -Dr Tula Nakayama office Referral Reason:pt has medicare but can't afford medications does not have drug coverage- has emphysema  Insurance: NextGen Medicare/medicaid of [place] family planningeffective 11/7/2018SSI disability Humana medicare effective 10/06/19   Mr terrall [facility]in [facility] [place] No ED visits or hospital admissions  Outreach attempt #1 successful at the home number  HIPAA verified (DOB and Address)  Mr Vanblarcom reports he is doing well  He denies any medical concerns He confirms he has been staying at home "quarantining"   Behavioral health Mr Hun reports he did call Ava and was informed Ava would return a call back to him. No notes noted in Epic Not sure if [facility] has access to Epic. Goal met He denies feeling depressed, sad, anxious, angry, etc "I been chilling" He reports compliance with his medications  Right Elbow olecranon bursa Mr Rueter reports his elbow wound site is healing "Itching" He denies drainage, swelling only soreness. He reports he continues to be compliant with his antibiotics     COPD Mr Jill Alexanders reports he still experience some breathing concerns at times but he uses his inhalers and rest. He reports his COPD as manageable   Mr Mccannon reports his only goal he wants to work on in 2021 is to get new glasses but is managing with his store purchased reading glasses until his scheduled February 17 2020 appointment  HTN Mr Bushway reports he is not checking his BP at home with his mother's BP cuff He reports he was informed at his last appointment with Dr Aline Brochure that his BP was "fine" He was encouraged again to check his BP at home preferably before taking BP medication He voiced understanding  The  last noted BP in Epic for him was 130/84 on 12/04/19 during an office visit with Dr Aline Brochure   [facility] RN CM and Mr Chrisley reviewed his upcoming medical appointment dates: 01/27/20 0800 primary care MD Dr Moshe Cipro 02/17/20 ophthalmology appointment  07/29/20 0900 Primary care MD office Cherly [place].NP - Annual wellness visit   [facility] RN CM will follow up with Mr Favorite in the next 60-70 days to remind him of his ophthalmology appointment and to follow up on his primary care MD appointment as agreed upon   [place] Problem One     Most Recent Value  Care Plan Problem One  Medication insurance coverage affects cost  Role Documenting the Problem One  Care Management Telephonic Coordinator  Care Plan for Problem One  Active  [facility] Long Term Goal   over the next 29 day patietn will have insurance coverage for medication benefit or medication assistance as evidence by documentation in EPIC or patient/family verbalizing during contact  [place] Term Goal Start Date  09/04/19  [facility] Long Term Goal Met Date  10/06/19  [facility] CM Short Term Goal #1   over the next 14 day patient will work with his nephew, MD or a pharmacy staff to obtain medicare part D or medication assistance as evidence by report of his MD, family or a pharmacist  [facility] CM Short Term Goal #1 Start Date  09/04/19  [facility] CM Short Term Goal #1 Met Date  10/06/19    [facility] CM Care Plan Problem Two     Most Recent Value  Care Plan Problem Two  transportation to medical appointments  Role Documenting the Problem Two  Care Management Jane for Problem Two  Active  [facility] Long Term Goal  over the next 29 days patient will be able to access local medical transportation to medical appointments as evidence by verbalizaton during contact with patient and mother or noted documentation   [facility]N Long Term Goal Start Date  09/04/19  [facility] Long Term Goal Met Date  10/06/19  [facility] CM Short Term Goal #1   over the next 7 day patient will  receive alternate medical transportation options/resouce information as evidence by verbalization during contact  THN CM Short Term Goal #1 Start Date  09/04/19  [facility] CM Short Term Goal #1 Met Date   09/18/19    [facility] CM Care Plan Problem Three     Most Recent Value  Care Plan Problem Three  Behavior health /anxiety  Role Documenting the Problem Three  Care Management Telephonic Coordinator  Care Plan for Problem Three  Active  [facility]N Long Term Goal   over the next 30 days patient will make an effort to work with recommended [facility] staff and taking his medicine as evidence by verbalization and a possible appointment stated during a follow up call  [place] Term Goal Start Date  10/06/19  [facility] Long Term Goal Met Date  12/21/19  [facility] CM Short Term Goal #1   over the next 30 days the patient will call [facility] staff recommended for possilble services as verbalized during follow up call  [facility]N CM Short Term Goal #1 Start Date  10/21/19  [facility] CM Short Term Goal #1 Met Date  12/21/19  [facility] CM Short Term Goal #2   over the next 30 days patient will get an appointment with an eye provider as confirmed by the attended appointment and verbalization during follow up call   [facility] CM Short Term Goal #2 Start Date  10/21/19  [facility] CM Short Term Goal #2 Met Date  11/04/19      Joelene Millin L. Lavina Hamman, RN, BSN, [place] Coordinator Office number [phone] Mobile number [phone]  Main [facility]N number [phone] Fax number [phone]

## 2020-01-08 ENCOUNTER — Other Ambulatory Visit: Payer: Self-pay

## 2020-01-08 ENCOUNTER — Telehealth: Payer: Self-pay | Admitting: *Deleted

## 2020-01-08 MED ORDER — ALBUTEROL SULFATE HFA 108 (90 BASE) MCG/ACT IN AERS: 2.0000 | INHALATION_SPRAY | Freq: Four times a day (QID) | RESPIRATORY_TRACT | 2 refills | Status: DC | PRN

## 2020-01-08 MED ORDER — IPRATROPIUM-ALBUTEROL 0.5-2.5 (3) MG/3ML IN SOLN: RESPIRATORY_TRACT | 3 refills | Status: DC

## 2020-01-08 MED ORDER — DULERA 200-5 MCG/ACT IN AERO: 2.0000 | INHALATION_SPRAY | Freq: Two times a day (BID) | RESPIRATORY_TRACT | 3 refills | Status: DC

## 2020-01-08 NOTE — Telephone Encounter (Signed)
Medications refilled and sent to pharmacy.

## 2020-01-08 NOTE — Telephone Encounter (Signed)
Pt needs his dulera inhaler the ipratropium bromide 0.5mg  and albuterol sulfate 3 mg inhalation solution and albuterol inhaler  And would like this sent to [facility].

## 2020-01-19 ENCOUNTER — Other Ambulatory Visit: Payer: Self-pay | Admitting: Family Medicine

## 2020-01-27 ENCOUNTER — Encounter: Payer: Self-pay | Admitting: Family Medicine

## 2020-01-27 ENCOUNTER — Ambulatory Visit: Payer: Medicare Other | Admitting: Family Medicine

## 2020-01-27 ENCOUNTER — Other Ambulatory Visit: Payer: Self-pay

## 2020-01-27 ENCOUNTER — Ambulatory Visit (INDEPENDENT_AMBULATORY_CARE_PROVIDER_SITE_OTHER): Payer: Medicare Other | Admitting: Family Medicine

## 2020-01-27 VITALS — BP 130/88 | HR 74 | Temp 96.8°F | Resp 15 | Ht 66.0 in | Wt 136.1 lb

## 2020-01-27 DIAGNOSIS — F418 Other specified anxiety disorders: Secondary | ICD-10-CM

## 2020-01-27 DIAGNOSIS — E559 Vitamin D deficiency, unspecified: Secondary | ICD-10-CM

## 2020-01-27 DIAGNOSIS — I10 Essential (primary) hypertension: Secondary | ICD-10-CM | POA: Diagnosis not present

## 2020-01-27 DIAGNOSIS — F10288 Alcohol dependence with other alcohol-induced disorder: Secondary | ICD-10-CM | POA: Diagnosis not present

## 2020-01-27 DIAGNOSIS — J432 Centrilobular emphysema: Secondary | ICD-10-CM

## 2020-01-27 DIAGNOSIS — F17218 Nicotine dependence, cigarettes, with other nicotine-induced disorders: Secondary | ICD-10-CM

## 2020-01-27 NOTE — Assessment & Plan Note (Signed)
Advised to continue to refrain from drinking. He reports he drank night of superbowl but otherwise is not actively drinking like he use to.

## 2020-01-27 NOTE — Assessment & Plan Note (Signed)
[name] is encouraged to maintain a well balanced diet that is low in salt. Controlled, continue current medication regimen.  No refills needed.  Additionally, she is also reminded that exercise is beneficial for heart health and control of  Blood pressure. 30-60 minutes daily is recommended-walking was suggested.

## 2020-01-27 NOTE — Assessment & Plan Note (Signed)
Asked about quitting: confirms they are currently smokes cigarettes Advise to quit smoking: Educated about QUITTING to reduce the risk of cancer, cardio and cerebrovascular disease. Assess willingness: Unwilling to quit at this time, but is working on cutting back. Assist with counseling and pharmacotherapy: Counseled for 5 minutes and literature provided. Arrange for follow up:  not quitting follow up in 3 months and continue to offer help.   

## 2020-01-27 NOTE — Assessment & Plan Note (Signed)
Stable, has inhalers, needed some changed due to insurance coverage, appears correct in system now. Advise for him to call if he has trouble.   Patient acknowledged agreement and understanding of the plan.

## 2020-01-27 NOTE — Patient Instructions (Addendum)
Happy New Year! May you have a year filled with hope, love, happiness and laughter.  I appreciate the opportunity to provide you with care for your health and wellness. Today we discussed: blood pressure, smoking, and anxiety, and upset stomach   Follow up: please see when last annual was and schedule this with me in office  ___________  No labs or referrals today  Do not eat before your next appt- we will get labs that day.   Work to reduce smoking by one cigarette less a day.  Push fluids to help hydrate from upset stomach.  Please continue to practice social distancing to keep you, your family, and our community safe.  If you must go out, please wear a mask and practice good handwashing.  It was a pleasure to see you and I look forward to continuing to work together on your health and well-being. Please do not hesitate to call the office if you need care or have questions about your care.  Have a wonderful day and week. With Gratitude, Tereasa Coop, DNP, AGNP-BC

## 2020-01-27 NOTE — Progress Notes (Signed)
Subjective:  Patient ID: [name], male    DOB: Feb 22, 1956  Age: 64 y.o. MRN: [id number]  CC:  Chief Complaint  Patient presents with  . Hypertension    follow up      HPI  HPI  [name] is a 64 year old male patient who presents today for follow-up on hypertension and other chronic conditions.  He reports that he is doing very well and does not have any trouble taking his medications.  He reports being compliant with these.  He denies having any chest pain, shortness of breath, leg swelling, dizziness, headaches, vision changes, palpitations, cough or shortness of breath or any other signs or symptoms of infection and or elevated blood pressure or cardiac event.  Today patient denies signs and symptoms of COVID 19 infection including fever, chills, cough, shortness of breath, and headache. Past Medical, Surgical, Social History, Allergies, and Medications have been Reviewed.   Past Medical History:  Diagnosis Date  . Alcohol abuse   . ALLERGIC RHINITIS CAUSE UNSPECIFIED 01/10/2011   Qualifier: Diagnosis of  By: Lodema Hong MD, Claris Che    . Asthma   . COPD (chronic obstructive pulmonary disease) (HCC)   . Coronary atherosclerosis    Based on chest CT 2012  . Essential hypertension, benign   . History of neutropenia    Also lymphocytosis - followed by Dr. Zigmund Daniel  . Lymphocytosis 11/01/2011  . Neutropenia (HCC) 04/07/2012  . Nicotine addiction   . Nonspecific abnormal electrocardiogram (ECG) (EKG) 02/17/2014  . Olecranon bursitis, right elbow     Current Meds  Medication Sig  . albuterol (VENTOLIN HFA) 108 (90 Base) MCG/ACT inhaler Inhale 2 puffs into the lungs every 6 (six) hours as needed for wheezing or shortness of breath.  [name] aspirin EC 81 MG tablet Take 81 mg by mouth daily.  . busPIRone (BUSPAR) 5 MG tablet Take 1 tablet (5 mg total) by mouth 3 (three) times daily.  . ergocalciferol (VITAMIN D2) 1.25 MG (50000 UT) capsule Take 1 capsule (50,000 Units  total) by mouth once a week. One capsule once weekly  . ipratropium-albuterol (DUONEB) 0.5-2.5 (3) MG/3ML SOLN INHALE 1 VIAL VIA NEBULIZER EVERY SIX HOURS AS NEEDED FOR WHEEZING/SHORTNESS OF BREATH FOR UP TO 5 DAYS.  [name] mometasone-formoterol (DULERA) 200-5 MCG/ACT AERO Inhale 2 puffs into the lungs 2 (two) times daily.  [name] triamterene-hydrochlorothiazide (MAXZIDE-25) 37.5-25 MG tablet TAKE (1) TABLET BY MOUTH ONCE DAILY. (Patient taking differently: Take 1 tablet by mouth daily. )    ROS:  Review of Systems  Constitutional: Negative.   HENT: Negative.   Eyes: Negative.   Respiratory: Negative.   Cardiovascular: Negative.   Gastrointestinal: Negative.   Genitourinary: Negative.   Musculoskeletal: Negative.   Skin: Negative.   Neurological: Negative.   Endo/Heme/Allergies: Negative.   Psychiatric/Behavioral: Negative.   All other systems reviewed and are negative.    Objective:   Today's Vitals: BP 130/88   Pulse 74   Temp (!) 96.8 F (36 C) (Temporal)   Resp 15   Ht 5\' 6"  (1.676 m)   Wt 136 lb 1.9 oz (61.7 kg)   SpO2 96%   BMI 21.97 kg/m  Vitals with BMI 01/27/2020 12/04/2019 11/26/2019  Height 5\' 6"  5\' 6"  -  Weight 136 lbs 2 oz 148 lbs -  BMI 21.98 23.9 -  Systolic 130 130 14/09/2019  Diastolic 88 84 110  Pulse 74 87 70     Physical Exam Vitals and nursing note reviewed.  Constitutional:      Appearance: Normal appearance. He is well-developed, well-groomed and normal weight.  HENT:     Head: Normocephalic and atraumatic.     Comments: Mask in place     Right Ear: External ear normal.     Left Ear: External ear normal.  Eyes:     General:        Right eye: No discharge.        Left eye: No discharge.     Conjunctiva/sclera: Conjunctivae normal.  Cardiovascular:     Rate and Rhythm: Normal rate and regular rhythm.     Pulses: Normal pulses.     Heart sounds: Normal heart sounds.  Pulmonary:     Effort: Pulmonary effort is normal.     Breath sounds: Normal breath  sounds.  Musculoskeletal:        General: Normal range of motion.     Cervical back: Normal range of motion and neck supple.  Skin:    General: Skin is warm.  Neurological:     General: No focal deficit present.     Mental Status: He is alert and oriented to person, place, and time.  Psychiatric:        Attention and Perception: Attention normal.        Mood and Affect: Mood normal.        Speech: Speech normal.        Behavior: Behavior normal. Behavior is cooperative.        Thought Content: Thought content normal.        Cognition and Memory: Cognition normal.        Judgment: Judgment normal.     GAD 7 : Generalized Anxiety Score 01/27/2020 08/05/2019 07/09/2019 07/08/2019  Nervous, Anxious, on Edge 0 1 3 3   Control/stop worrying 0 1 3 2   Worry too much - different things 0 0 2 2  Trouble relaxing 0 0 3 3  Restless 0 0 2 3  Easily annoyed or irritable 0 2 2 3   Afraid - awful might happen 0 0 3 2  Total GAD 7 Score 0 4 18 18   Anxiety Difficulty Not difficult at all Not difficult at all Very difficult Extremely difficult      Depression screen [facility] 2/9 01/27/2020 12/21/2019 12/02/2019 09/18/2019 09/04/2019  Decreased Interest 0 0 0 0 0  Down, Depressed, Hopeless 0 0 0 0 1  PHQ - 2 Score 0 0 0 0 1  Altered sleeping - - - - -  Tired, decreased energy - - - - -  Change in appetite - - - - -  Feeling bad or failure about yourself  - - - - -  Trouble concentrating - - - - -  Moving slowly or fidgety/restless - - - - -  Suicidal thoughts - - - - -  PHQ-9 Score - - - - -  Difficult doing work/chores - - - - -  Some recent data might be hidden    Assessment   1. Centrilobular emphysema (HCC)   2. Alcohol dependence with other alcohol-induced disorder (HCC)   3. Essential hypertension   4. Nicotine dependence, cigarettes, with other nicotine-induced disorders   5. Depression with anxiety   6. Vitamin D deficiency     Tests ordered No orders of the defined types were placed  in this encounter.    Plan: Please see assessment and plan per problem list above.   No orders of the defined types were placed  in this encounter.   Patient to follow-up in 3 months.  Perlie Mayo, NP

## 2020-01-27 NOTE — Assessment & Plan Note (Signed)
Both PHQ and GAD 0 today in office. Denies SI and HI today. Overall he reports feeling better.

## 2020-01-27 NOTE — Assessment & Plan Note (Signed)
Is continuing to take medication. Not having trouble with this. Will get recheck of level at annual appt

## 2020-02-10 ENCOUNTER — Other Ambulatory Visit: Payer: Self-pay | Admitting: Family Medicine

## 2020-02-15 ENCOUNTER — Encounter: Payer: Self-pay | Admitting: *Deleted

## 2020-02-15 ENCOUNTER — Other Ambulatory Visit: Payer: Self-pay

## 2020-02-15 ENCOUNTER — Other Ambulatory Visit: Payer: Self-pay | Admitting: *Deleted

## 2020-02-15 NOTE — Patient Outreach (Signed)
[facility]) Care Management  02/15/2020  [name] Oct 03, 1956 [id number]   Telephone Screen/further assessment and follow up Referral Date:09/04/19 Referral Source:MD referral -Dr Tula Nakayama office Referral Reason:pt has medicare but can't afford medications does not have drug coverage- has emphysema -Resolved medicare coverage issue in October 2020 Insurance: [employer] Medicare[place] planning[place] 11/7/2018SSI disability [employer] medicare effective 10/06/19   [name] [address] No ED visits or hospital admissions  Outreach successful at the home number  HIPAA verified (DOB and Address)  [name] reports he is doing "fine" He denies any medical concerns He continues to stay at home "quarantining" and wears his mask when out visiting or out in [place] he completed his primary care provider (pcp) visit on 01/27/20 with Percell Boston, NP and "everything went well" He confirms he got his updated inhalers from his pharmacy without concerns since pcp office visit and he is talking them as ordered  Eye exam and glasses  [facility] RN CM completed a conference call with [name] to [place] at [address] [place] [address]  Spoke with [place] at [phone] to confirm his appointment for 02/17/20 at [place] provided his mother, Janifer Adie with the address and contact number for [facility] as she will be assisting to transport him to his appointment on 02/17/20    Right Elbow olecranon bursa [name] reports his elbow wound site is healed/resolved   COPD [name] reports he still experience some breathing concerns at times but he uses his inhalers and rest. He reports his COPD as manageable  No labs noted during 01/26/19 primary care provider (PCP) visit  HTN [name] reports his BP is doing fine  The last documented one is on 01/27/20  pcp office visit- 130/88 No labs noted during 01/26/19 primary care provider (PCP) visit  Social: [name] is at 64 year old single retired male on disability who lives with his mother and sister, Hassan Rowan. His father has been deceased for 9 years. He retired 3 years ago per his mother as he began to have difficulty breathing. He has no children. He has a brother in the navy He has not been in the [employer]. He admits hecan read buthas difficulty readingand understanding what he reads. He states his mother or sister assists with reading. His mother confirms he can read but does not understand some things he reads. He is independent in his ADLs but needs assist with his iADLs. He does not drive. 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  He and his mother voices he has "anger problems" He likes football, mowing lawns and painting  Conditions:COPD per pt- [facility]erral to [facility] reports  (Centrilobular) emphysema, HTN, Coronary atherosclerosis, allergic rhinitis, nicotine dependence, currentsmoker (he reportsoff andon "cause things get on my nerve"), depression with generalizedanxietydisorder, lymphocytosis, neutropenia, vitamin D deficiency, alcohol addiction  [id number], borrows his mother's BP cuff,general store purchased eyeglasses, oxygen for prn use (not using)   upcoming medical appointment dates: 02/17/20 0920 ophthalmology appointment  07/29/20 0900 Primary care MD office [place]ly [place].NP - Annual wellness visit   [facility] RN CM will follow up with Nicholas  [name] in the next 90-100 days to follow up with him on mailed EMMIs,  his eye exam and glasses  [facility] RN CM sent EMMI materials on COPD, High blood  pressure in adults via mail to [name] Pt encouraged to return a call to [facility] RN CM prn Routed note to MD  [place] Problem One     Most Recent Value  Care Plan Problem One  Medication insurance coverage affects cost  Role Documenting the Problem One  Care Management Telephonic Coordinator  Care Plan for Problem One  Active  [facility] Long Term Goal   over the next 29 day patietn will have insurance coverage for medication benefit or medication assistance as evidence by documentation in EPIC or patient/family verbalizing during contact  [place] Goal Start Date  09/04/19  [facility] Long Term Goal Met Date  10/06/19  [facility] CM Short Term Goal #1   over the next 14 day patient will work with his nephew, MD or a pharmacy staff to obtain medicare part D or medication assistance as evidence by report of his MD, family or a pharmacist  [facility] CM Short Term Goal #1 Start Date  09/04/19  [facility] CM Short Term Goal #1 Met Date  10/06/19    [facility] CM Care Plan Problem Two     Most Recent Value  Care Plan Problem Two  transportation to medical appointments  Role Documenting the Problem Two  Care Management Telephonic Coordinator  Care Plan for Problem Two  Active  [facility] Long Term Goal  over the next 29 days patient will be able to access local medical transportation to medical appointments as evidence by verbalizaton during contact with patient and mother or noted documentation   [facility] Long Term Goal Start Date  09/04/19  [facility] Long Term Goal Met Date  10/06/19  [facility] CM Short Term Goal #1   over the next 7 day patient will receive alternate medical transportation options/resouce information as evidence by verbalization during contact  [facility] CM Short Term Goal #1 Start Date  09/04/19  [facility] CM Short Term Goal #1 Met Date   09/18/19    [facility] CM Care Plan Problem Three     Most Recent Value  Care Plan Problem Three  preventive care -eye care,knowledge of home management for major medical conditions, HTN, COPD  Role  Documenting the Problem Three  Care Management Telephonic Coordinator  Care Plan for Problem Three  Active  [facility] Long Term Goal   over the next 90 days patient will verbalized understanding of home care for HTN, COPD and eye cre preventive care during a follow up call  [place] Term Goal Start Date  02/15/20  Interventions for Problem Three Long Term Goal  completed conference call with patient to confirm 02/17/20 [facility] appointment provided his mother with demographics for upcoming eye appointment  [facility] CM Short Term Goal #1   over the next 30 days the patient will attend his scheduled eye exam and be assisted with glasses prn as evidence by verbalization during follow up outreach  [facility] CM Short Term Goal #1 Start Date  02/15/20  Interventions for Short Term Goal #1  completed conference call with patient to confirm 02/17/20 [facility] appointment provided his mother with demographics for upcoming eye appointment  [facility] CM Short Term Goal #2   over the next 30 days patient will received EMMI materials related to his major medical diagnoses: COPD & HTN as confirmed by verbalization during follow  up call   [facility] CM Short Term Goal #2 Start Date  02/15/20  Interventions for Short Term Goal #2  sent EMMI materials for COPD and HTN       Araly Kaas L. Lavina Hamman, RN, BSN, [place] Coordinator Office number [phone] Mobile number [phone]  Main [facility] number [phone] Fax number [phone]

## 2020-03-16 ENCOUNTER — Other Ambulatory Visit: Payer: Self-pay | Admitting: Family Medicine

## 2020-04-26 ENCOUNTER — Ambulatory Visit: Payer: Medicare Other | Admitting: Family Medicine

## 2020-05-07 ENCOUNTER — Other Ambulatory Visit: Payer: Self-pay

## 2020-05-07 ENCOUNTER — Emergency Department (HOSPITAL_COMMUNITY)
Admission: EM | Admit: 2020-05-07 | Discharge: 2020-05-07 | Disposition: A | Discharge status: Home / Self Care | Payer: Medicare Other | Attending: Emergency Medicine | Admitting: Emergency Medicine

## 2020-05-07 ENCOUNTER — Encounter (HOSPITAL_COMMUNITY): Payer: Self-pay | Admitting: Emergency Medicine

## 2020-05-07 ENCOUNTER — Emergency Department (HOSPITAL_COMMUNITY): Payer: Medicare Other

## 2020-05-07 DIAGNOSIS — F17210 Nicotine dependence, cigarettes, uncomplicated: Secondary | ICD-10-CM | POA: Insufficient documentation

## 2020-05-07 DIAGNOSIS — M545 Low back pain, unspecified: Secondary | ICD-10-CM

## 2020-05-07 DIAGNOSIS — I10 Essential (primary) hypertension: Secondary | ICD-10-CM | POA: Diagnosis not present

## 2020-05-07 DIAGNOSIS — Z79899 Other long term (current) drug therapy: Secondary | ICD-10-CM | POA: Insufficient documentation

## 2020-05-07 DIAGNOSIS — I2510 Atherosclerotic heart disease of native coronary artery without angina pectoris: Secondary | ICD-10-CM | POA: Diagnosis not present

## 2020-05-07 DIAGNOSIS — J449 Chronic obstructive pulmonary disease, unspecified: Secondary | ICD-10-CM | POA: Diagnosis not present

## 2020-05-07 MED ORDER — METHOCARBAMOL 500 MG PO TABS: 500.0000 mg | ORAL_TABLET | Freq: Two times a day (BID) | ORAL | 0 refills | Status: DC | PRN

## 2020-05-07 MED ORDER — LIDOCAINE 5 % EX PTCH: 1.0000 | MEDICATED_PATCH | CUTANEOUS | 0 refills | Status: DC

## 2020-05-07 MED ORDER — PREDNISONE 10 MG (21) PO TBPK: ORAL_TABLET | Freq: Every day | ORAL | 0 refills | Status: DC

## 2020-05-07 MED ORDER — OXYCODONE-ACETAMINOPHEN 5-325 MG PO TABS
1.0000 | ORAL_TABLET | Freq: Once | ORAL | Status: AC
  Administered 2020-05-07: 1 via ORAL
  Filled 2020-05-07: qty 1

## 2020-05-07 NOTE — ED Triage Notes (Signed)
Patient c/o lower back pain that radiates into left buttock and leg. Per patient started Thursday after planting flowers. Denies any complications with BMs or urination. Patient reports taking tylenol with brief relief, last dose yesterday.

## 2020-05-07 NOTE — ED Provider Notes (Addendum)
[facility] EMERGENCY DEPARTMENT Provider Note   CSN: [id number] Arrival date & time: 05/07/20  0957     History Chief Complaint  Patient presents with  . Back Pain    [name] is a 64 y.o. male with a history of tobacco abuse, COPD, hypertension, coronary arthrosclerosis, and anxiety who presents to the emergency department with complaints of back pain for the past few days. Patient states that he was petting/planting flowers doing some heavy lifting and later that evening he developed pain in the left lower back/hip area that radiates into the buttock/leg. Discomfort is constant, worse with movement, no alleviating factors. He tried heat and Tylenol without much relief. He has had back pain similar to this a while ago. He denies specific traumatic injury/falls. Denies numbness, tingling, weakness, saddle anesthesia, incontinence to bowel/bladder, fever, chills, IV drug use, dysuria, or hx of cancer. Patient has not had prior back surgeries.   HPI     Past Medical History:  Diagnosis Date  . Alcohol abuse   . ALLERGIC RHINITIS CAUSE UNSPECIFIED 01/10/2011   Qualifier: Diagnosis of  By: Moshe Cipro MD, Joycelyn Schmid    . Asthma   . COPD (chronic obstructive pulmonary disease) ([place])   . Coronary atherosclerosis    Based on chest CT 2012  . Essential hypertension, benign   . History of neutropenia    Also lymphocytosis - followed by Dr. Barnet Glasgow  . Lymphocytosis 11/01/2011  . Neutropenia ([place]) 04/07/2012  . Nicotine addiction   . Nonspecific abnormal electrocardiogram (ECG) (EKG) 02/17/2014  . Olecranon bursitis, right elbow     Patient Active Problem List   Diagnosis Date Noted  . GAD (generalized anxiety disorder) 08/01/2016  . Centrilobular emphysema ([place]) 11/01/2015  . Alcohol addiction ([place]) 06/23/2015  . Vitamin D deficiency 03/24/2015  . Coronary atherosclerosis 03/11/2014  . Depression with anxiety 02/10/2013  . Nicotine dependence, cigarettes, with other  nicotine-induced disorders 03/30/2008  . Essential hypertension 03/30/2008    Past Surgical History:  Procedure Laterality Date  . MULTIPLE TOOTH EXTRACTIONS  June 2012  . OLECRANON BURSECTOMY Right 11/26/2019   Procedure: OLECRANON BURSECTOMY;  Surgeon: Carole Civil, MD;  Location: [facility];  Service: Orthopedics;  Laterality: Right;  pt knows to arrive at 6:15       Family History  Problem Relation Age of Onset  . Diabetes Mother   . Hypertension Mother   . Diabetes Father   . Hypertension Sister     Social History   Tobacco Use  . Smoking status: Current Every Day Smoker    Packs/day: 0.25    Years: 38.00    Pack years: 9.50    Types: Cigarettes    Last attempt to quit: 12/02/2017    Years since quitting: 2.4  . Smokeless tobacco: Never Used  Substance Use Topics  . Alcohol use: No    Alcohol/week: 18.0 standard drinks    Types: 18 Cans of beer per week    Comment: quit before Christmas 2018  . Drug use: No    Home Medications Prior to Admission medications   Medication Sig Start Date End Date Taking? Authorizing Provider  albuterol (VENTOLIN HFA) 108 (90 Base) MCG/ACT inhaler Inhale 2 puffs into the lungs every 6 (six) hours as needed for wheezing or shortness of breath. 01/08/20   Fayrene Helper, MD  aspirin EC 81 MG tablet Take 81 mg by mouth daily.    [provider]  busPIRone (BUSPAR) 5 MG tablet Take 1 tablet (  5 mg total) by mouth 3 (three) times daily. 08/27/19   Kerri Perches, MD  ergocalciferol (VITAMIN D2) 1.25 MG (50000 UT) capsule Take 1 capsule (50,000 Units total) by mouth once a week. One capsule once weekly 08/27/19   Kerri Perches, MD  ipratropium-albuterol (DUONEB) 0.5-2.5 (3) MG/3ML SOLN INHALE 1 VIAL VIA NEBULIZER EVERY SIX HOURS AS NEEDED FOR WHEEZING/SHORTNESS OF BREATH FOR UP TO 5 DAYS. 03/16/20   Kerri Perches, MD  mometasone-formoterol (DULERA) 200-5 MCG/ACT AERO Inhale 2 puffs into the lungs 2 (two) times  daily. 01/08/20   Kerri Perches, MD  triamterene-hydrochlorothiazide (MAXZIDE-25) 37.5-25 MG tablet TAKE (1) TABLET BY MOUTH ONCE DAILY. Patient taking differently: Take 1 tablet by mouth daily.  08/27/19   Kerri Perches, MD    Allergies    Lisinopril  Review of Systems   Review of Systems  Constitutional: Negative for chills, fever and unexpected weight change.  Gastrointestinal: Negative for abdominal pain, nausea and vomiting.  Genitourinary: Negative for dysuria and hematuria.  Musculoskeletal: Positive for back pain.  Neurological: Negative for weakness and numbness.       Negative for saddle anesthesia or bowel/bladder incontinence.   All other systems reviewed and are negative.   Physical Exam Updated Vital Signs BP (S) (!) 168/115 (BP Location: Right Arm) Comment: Pt has HTN but has not taken medication in past 2 days  Pulse 92   Temp 98.7 F (37.1 C) (Oral)   Resp 18   Ht 5\' 11"  (1.803 m)   Wt 65.8 kg   BMI 20.22 kg/m   Physical Exam Vitals and nursing note reviewed.  Constitutional:      General: He is not in acute distress.    Appearance: He is well-developed. He is not ill-appearing or toxic-appearing.  HENT:     Head: Normocephalic and atraumatic.  Cardiovascular:     Rate and Rhythm: Normal rate and regular rhythm.     Pulses:          Dorsalis pedis pulses are 2+ on the right side and 2+ on the left side.       Posterior tibial pulses are 2+ on the right side and 2+ on the left side.  Pulmonary:     Effort: Pulmonary effort is normal.     Breath sounds: Normal breath sounds.  Abdominal:     General: There is no distension.     Palpations: Abdomen is soft.     Tenderness: There is no abdominal tenderness. There is no guarding or rebound.  Musculoskeletal:     Cervical back: Normal range of motion and neck supple. No spinous process tenderness or muscular tenderness.     Comments: No obvious deformity, appreciable swelling, erythema,  ecchymosis, significant open wounds, or increased warmth.   Back: Patient tender to diffuse midline lumbar region extending to left paraspinal muscles as well as the gluteal region. No point/focal vertebral tenderness, no palpable step off or crepitus.  Lower extremities: Intact active range of motion throughout. Patient tender over the left lateral hip, including greater trochanter.  Skin:    General: Skin is warm and dry.     Capillary Refill: Capillary refill takes less than 2 seconds.     Findings: No rash.  Neurological:     Mental Status: He is alert.     Deep Tendon Reflexes:     Reflex Scores:      Patellar reflexes are 2+ on the right side and 2+ on the  left side.    Comments: Sensation grossly intact to bilateral lower extremities. 5/5 symmetric strength with plantar/dorsiflexion bilaterally. Gait is intact without obvious foot drop.   Psychiatric:        Mood and Affect: Mood normal.        Behavior: Behavior normal.    ED Results / Procedures / Treatments   Labs (all labs ordered are listed, but only abnormal results are displayed) Labs Reviewed - No data to display  EKG None  Radiology DG Lumbar Spine Complete  Result Date: 05/07/2020 CLINICAL DATA:  Back pain EXAM: LUMBAR SPINE - COMPLETE 4+ VIEW COMPARISON:  None. FINDINGS: No fracture or dislocation of the lumbar spine. There mild multilevel disc space height loss throughout with large multilevel bridging osteophytes. Mild to moderate multilevel facet degenerative change. Nonobstructive pattern of overlying bowel gas. IMPRESSION: No fracture or dislocation of the lumbar spine. There mild multilevel disc space height loss throughout with large multilevel bridging osteophytes. Mild to moderate multilevel facet degenerative change. Electronically Signed   By: Lauralyn Primes M.D.   On: 05/07/2020 11:50   DG Hip Unilat With Pelvis 2-3 Views Left  Result Date: 05/07/2020 CLINICAL DATA:  Back pain EXAM: DG HIP (WITH OR WITHOUT  PELVIS) 2-3V LEFT COMPARISON:  None. FINDINGS: There is no evidence of hip fracture or dislocation. There is no evidence of arthropathy or other focal bone abnormality. IMPRESSION: No fracture or dislocation of the left hip. Joint spaces are preserved. Electronically Signed   By: Lauralyn Primes M.D.   On: 05/07/2020 11:51    Procedures Procedures (including critical care time)  Medications Ordered in ED Medications - No data to display  ED Course  I have reviewed the triage vital signs and the nursing notes.  Pertinent labs & imaging results that were available during my care of the patient were reviewed by me and considered in my medical decision making (see chart for details).    MDM Rules/Calculators/A&P                      Patient presents with complaint of back pain.  Patient is nontoxic appearing, vitals are WNL with the exception of elevated blood pressure, patient has not taken his antihypertensive medication today, low suspicion for hypertensive emergency. Chart and nursing notes reviewed for additional history.  I have ordered Percocet to help with pain.  I have ordered x-rays of the L-spine and left hip, I have personally reviewed and interpreted imaging, left hip x-ray without fracture or dislocation, preserved joint spaces, lumbar spine x-ray reveals no fracture or dislocation of the lumbar spine. There is mild multilevel disc space height loss throughout with large multilevel bridging osteophytes. Mild to moderate multilevel facet degenerative change.  12:10: On reevaluation patient is feeling improved, states that pain medication has helped. Patient has normal neurologic exam, no point/focal midline tenderness to palpation. Ambulatory in the ED.  No back pain red flags. No urinary sxs. Most likely muscle strain versus spasm, potential degree of sciatica, also potential degree of degenerative changes playing a factor.. Considered UTI/pyelonephritis, kidney stone, aortic  aneurysm/dissection, cauda equina or epidural abscess however these do not feel these diagnoses fit clinical picture at this time. Will treat with Robaxin, steroid taper, and Lidoderm patches, discussed with patient that they are not to drive or operate heavy machinery while taking Robaxin. I discussed treatment plan, need for PCP follow-up, and return precautions with the patient. Provided opportunity for questions, patient confirmed understanding and  is in agreement with plan.   Final Clinical Impression(s) / ED Diagnoses Final diagnoses:  Acute left-sided low back pain, unspecified whether sciatica present    Rx / DC Orders ED Discharge Orders         Ordered    lidocaine (LIDODERM) 5 %  Every 24 hours     05/07/20 1214    methocarbamol (ROBAXIN) 500 MG tablet  2 times daily PRN     05/07/20 1214    predniSONE (STERAPRED UNI-PAK 21 TAB) 10 MG (21) TBPK tablet  Daily     05/07/20 [address], [place] R, PA-C 05/07/20 [address] [place], PA-C 05/07/20 1216    Vanetta Mulders, MD 06/07/20 2235

## 2020-05-07 NOTE — Discharge Instructions (Addendum)
You were seen in the emergency department for back pain today.  Your x-rays of your lower back did show some bone spurs and degenerative changes. Your left hip x-ray was normal.  I have prescribed you an anti-inflammatory medication and a muscle relaxer.  -Prednisone- this is a medication that will help with pain and swelling. Be sure to take this medication as prescribed with food in the morning.  It should be taken with food, as it can cause stomach upset, and more seriously, stomach bleeding. Do not take other nonsteroidal anti-inflammatory medications with this such as Advil, Motrin, Aleve, Mobic, Goodie Powder, or Motrin.    - Robaxin is the muscle relaxer I have prescribed, this is meant to help with muscle tightness. Be aware that this medication may make you drowsy therefore the first time you take this it should be at a time you are in an environment where you can rest. Do not drive or operate heavy machinery when taking this medication. Do not drink alcohol or take other sedating medications with this medicine such as narcotics or benzodiazepines.   - Lidoderm patch-please apply one patch to your area of most significant pain once per day and remove within 12 hours. This is to help numb/to the muscle area.  You make take Tylenol per over the counter dosing with these medications.   We have prescribed you new medication(s) today. Discuss the medications prescribed today with your pharmacist as they can have adverse effects and interactions with your other medicines including over the counter and prescribed medications. Seek medical evaluation if you start to experience new or abnormal symptoms after taking one of these medicines, seek care immediately if you start to experience difficulty breathing, feeling of your throat closing, facial swelling, or rash as these could be indications of a more serious allergic reaction   The application of heat can help soothe the pain.    Please follow-up  with your primary care provider and/or orthopedics within 1 week. However return to the ER should you develop ne or worsening symptoms or any other concerns including but not limited to severe or worsening pain, low back pain with fever, numbness, weakness, loss of bowel or bladder control, or inability to walk or urinate, you should return to the ER immediately.     Please be sure to take your blood pressure medication and have your blood pressure rechecked as well as it was elevated in the ER today. Vitals:   05/07/20 1013  BP: (S) (!) 168/115  Pulse: 92  Resp: 18  Temp: 98.7 F (37.1 C)

## 2020-05-17 ENCOUNTER — Other Ambulatory Visit: Payer: Self-pay

## 2020-05-17 ENCOUNTER — Other Ambulatory Visit: Payer: Self-pay | Admitting: *Deleted

## 2020-05-17 ENCOUNTER — Ambulatory Visit (INDEPENDENT_AMBULATORY_CARE_PROVIDER_SITE_OTHER): Payer: Medicare Other | Admitting: Family Medicine

## 2020-05-17 ENCOUNTER — Encounter: Payer: Self-pay | Admitting: Family Medicine

## 2020-05-17 ENCOUNTER — Encounter (INDEPENDENT_AMBULATORY_CARE_PROVIDER_SITE_OTHER): Payer: Self-pay | Admitting: *Deleted

## 2020-05-17 VITALS — BP 132/98 | HR 90 | Resp 15 | Ht 66.0 in | Wt 141.0 lb

## 2020-05-17 DIAGNOSIS — R7301 Impaired fasting glucose: Secondary | ICD-10-CM

## 2020-05-17 DIAGNOSIS — J432 Centrilobular emphysema: Secondary | ICD-10-CM

## 2020-05-17 DIAGNOSIS — Z1212 Encounter for screening for malignant neoplasm of rectum: Secondary | ICD-10-CM | POA: Insufficient documentation

## 2020-05-17 DIAGNOSIS — Z1211 Encounter for screening for malignant neoplasm of colon: Secondary | ICD-10-CM | POA: Insufficient documentation

## 2020-05-17 DIAGNOSIS — R9431 Abnormal electrocardiogram [ECG] [EKG]: Secondary | ICD-10-CM

## 2020-05-17 DIAGNOSIS — Z13220 Encounter for screening for lipoid disorders: Secondary | ICD-10-CM

## 2020-05-17 DIAGNOSIS — N400 Enlarged prostate without lower urinary tract symptoms: Secondary | ICD-10-CM | POA: Insufficient documentation

## 2020-05-17 DIAGNOSIS — F17218 Nicotine dependence, cigarettes, with other nicotine-induced disorders: Secondary | ICD-10-CM | POA: Diagnosis not present

## 2020-05-17 DIAGNOSIS — I2510 Atherosclerotic heart disease of native coronary artery without angina pectoris: Secondary | ICD-10-CM

## 2020-05-17 DIAGNOSIS — F10288 Alcohol dependence with other alcohol-induced disorder: Secondary | ICD-10-CM

## 2020-05-17 DIAGNOSIS — I10 Essential (primary) hypertension: Secondary | ICD-10-CM

## 2020-05-17 LAB — LIPID PANEL
Cholesterol: 209 mg/dL — ABNORMAL HIGH (ref <200)
HDL: 109 mg/dL (ref >=40)
LDL Cholesterol (Calc): 84 mg/dL (calc)
Non-HDL Cholesterol (Calc): 100 mg/dL (calc) (ref <130)
Total CHOL/HDL Ratio: 1.9 (calc) (ref <5.0)
Triglycerides: 74 mg/dL (ref <150)

## 2020-05-17 LAB — POCT GLYCOSYLATED HEMOGLOBIN (HGB A1C)
HbA1c POC (<> result, manual entry): 5.4 % (ref 4.0–5.6)
HbA1c, POC (controlled diabetic range): 5.4 % (ref 0.0–7.0)
HbA1c, POC (prediabetic range): 5.4 % — AB (ref 5.7–6.4)
Hemoglobin A1C: 5.4 % (ref 4.0–5.6)

## 2020-05-17 LAB — COMPLETE METABOLIC PANEL WITH GFR
AG Ratio: 1.4 (calc) (ref 1.0–2.5)
ALT: 20 U/L (ref 9–46)
AST: 23 U/L (ref 10–35)
Albumin: 4.4 g/dL (ref 3.6–5.1)
Alkaline phosphatase (APISO): 64 U/L (ref 35–144)
BUN: 18 mg/dL (ref 7–25)
CO2: 29 mmol/L (ref 20–32)
Calcium: 9.7 mg/dL (ref 8.6–10.3)
Chloride: 102 mmol/L (ref 98–110)
Creat: 1.01 mg/dL (ref 0.70–1.25)
GFR, Est African American: 93 mL/min/{1.73_m2} (ref >=60)
GFR, Est Non African American: 80 mL/min/{1.73_m2} (ref >=60)
Globulin: 3.2 g/dL (calc) (ref 1.9–3.7)
Glucose, Bld: 77 mg/dL (ref 65–99)
Potassium: 5.1 mmol/L (ref 3.5–5.3)
Sodium: 142 mmol/L (ref 135–146)
Total Bilirubin: 0.5 mg/dL (ref 0.2–1.2)
Total Protein: 7.6 g/dL (ref 6.1–8.1)

## 2020-05-17 LAB — CBC
HCT: 48.1 % (ref 38.5–50.0)
Hemoglobin: 15.5 g/dL (ref 13.2–17.1)
MCH: 28.6 pg (ref 27.0–33.0)
MCHC: 32.2 g/dL (ref 32.0–36.0)
MCV: 88.7 fL (ref 80.0–100.0)
MPV: 10.3 fL (ref 7.5–12.5)
Platelets: 329 10*3/uL (ref 140–400)
RBC: 5.42 10*6/uL (ref 4.20–5.80)
RDW: 12 % (ref 11.0–15.0)
WBC: 8 10*3/uL (ref 3.8–10.8)

## 2020-05-17 LAB — TSH: TSH: 1.8 mIU/L (ref 0.40–4.50)

## 2020-05-17 LAB — PSA: PSA: 1.3 ng/mL (ref <=4.0)

## 2020-05-17 MED ORDER — TRIAMTERENE-HCTZ 37.5-25 MG PO TABS: ORAL_TABLET | ORAL | 3 refills | Status: DC

## 2020-05-17 MED ORDER — IPRATROPIUM-ALBUTEROL 0.5-2.5 (3) MG/3ML IN SOLN: RESPIRATORY_TRACT | 0 refills | Status: DC

## 2020-05-17 NOTE — Assessment & Plan Note (Signed)
PSA ordered.  Denies having any signs or symptoms of BPH and or stream issues.

## 2020-05-17 NOTE — Assessment & Plan Note (Addendum)
Encouraged a heart healthy diet.  Updated labs ordered for next appointment.

## 2020-05-17 NOTE — Assessment & Plan Note (Addendum)
Ongoing use of nicotine.  EKG abnormal today.  Referral to cardiology for consultation.  Possible adjustment of medications needed.  Noncompliance is an issue. Additional lipid panel added.

## 2020-05-17 NOTE — Patient Instructions (Addendum)
I appreciate the opportunity to provide you with care for your health and wellness. Today we discussed: overall health   Follow up: 8-10 weeks for BP   Labs today -fasting Referrals today for colonoscopy and cardiology for follow up on EKG, ECHO ordered for [facility]  Please work to take your BP medication daily. Your heart shows some changes, I am referring you to a heart dr and ECHO to check structure of heart.  Please continue to practice social distancing to keep you, your family, and our community safe.  If you must go out, please wear a mask and practice good handwashing.  It was a pleasure to see you and I look forward to continuing to work together on your health and well-being. Please do not hesitate to call the office if you need care or have questions about your care.  Have a wonderful day and week. With Gratitude, Tereasa Coop, DNP, AGNP-BC

## 2020-05-17 NOTE — Assessment & Plan Note (Signed)
Ongoing use of nicotine.  Elevated BP today.  EKG abnormal today.  Referral to cardiology for consultation.  Possible adjustment of medications needed.  Noncompliance is an issue.

## 2020-05-17 NOTE — Assessment & Plan Note (Signed)
Updated labs ordered.

## 2020-05-17 NOTE — Progress Notes (Signed)
[name] is a 64 y.o. male who presents for annual wellness visit and follow-up on chronic medical conditions.  He has the following concerns: None  However his nephew is here and reports that he would like him to have help for substance abuse.  And is very concerned about his alcohol use.  Immunization History  Administered Date(s) Administered  . Influenza,inj,Quad PF,6+ Mos 10/15/2013, 09/27/2014, 09/21/2015, 09/23/2018, 08/10/2019  . Influenza-Unspecified 10/08/2017  . Pneumococcal Conjugate-13 06/22/2014  . Pneumococcal Polysaccharide-23 12/04/2012  . Tdap 05/09/2011   Last colonoscopy: ordered today Last PSA: ordered today Dentist: dentures-dont use Ophtho: eye appt at end of month  Exercise: walks daily   Other doctors caring for patient include:  End of Life Discussion:  Patient does not have a living will and medical power of attorney  The patient denies anorexia, fever, weight changes, headaches,  vision loss, decreased hearing, ear pain, hoarseness, chest pain, palpitations, dizziness, syncope, dyspnea on exertion, cough, swelling, nausea, vomiting, diarrhea, constipation, abdominal pain, melena, hematochezia, indigestion/heartburn, hematuria, incontinence, erectile dysfunction, nocturia, weakened urine stream, dysuria, genital lesions, joint pains, numbness, tingling, weakness, tremor, suspicious skin lesions, depression, anxiety, abnormal bleeding/bruising, or enlarged lymph nodes   PHYSICAL EXAM:  BP (!) 162/100   Pulse 90   Resp 15   Ht 5\' 6"  (1.676 m)   Wt 141 lb (64 kg)   SpO2 97%   BMI 22.76 kg/m   Physical Exam Vitals and nursing note reviewed.  Constitutional:      Appearance: Normal appearance. He is well-developed, well-groomed and normal weight.  HENT:     Head: Normocephalic and atraumatic.     Right Ear: Hearing, tympanic membrane, ear canal and external ear normal.     Left Ear: Hearing, tympanic membrane, ear canal and external ear normal.      Ears:     Weber exam findings: does not lateralize.    Right Rinne: AC > BC.    Left Rinne: AC > BC.    Nose: Nose normal.     Mouth/Throat:     Mouth: Mucous membranes are moist.     Pharynx: Oropharynx is clear.  Eyes:     General: Lids are normal.        Right eye: No discharge.        Left eye: No discharge.     Extraocular Movements: Extraocular movements intact.     Conjunctiva/sclera: Conjunctivae normal.     Pupils: Pupils are equal, round, and reactive to light.  Neck:     Thyroid: No thyroid mass, thyromegaly or thyroid tenderness.  Cardiovascular:     Rate and Rhythm: Normal rate and regular rhythm.     Pulses: Normal pulses.          Carotid pulses are 2+ on the right side and 2+ on the left side.      Radial pulses are 2+ on the right side and 2+ on the left side.       Dorsalis pedis pulses are 2+ on the right side and 2+ on the left side.       Posterior tibial pulses are 2+ on the right side and 2+ on the left side.     Heart sounds: Normal heart sounds.  Pulmonary:     Effort: Pulmonary effort is normal.     Breath sounds: Decreased breath sounds and rhonchi present.  Abdominal:     General: Abdomen is flat. Bowel sounds are normal. There is no distension.  Palpations: Abdomen is soft.     Tenderness: There is no abdominal tenderness. There is no right CVA tenderness or left CVA tenderness.     Hernia: No hernia is present.  Musculoskeletal:        General: Normal range of motion.     Cervical back: Full passive range of motion without pain, normal range of motion and neck supple.     Right lower leg: No edema.     Left lower leg: No edema.     Comments: Moves all extremities, range of motion intact.  Feet:     Right foot:     Skin integrity: Skin integrity normal.     Left foot:     Skin integrity: Skin integrity normal.  Lymphadenopathy:     Cervical: No cervical adenopathy.  Skin:    General: Skin is warm and dry.     Capillary Refill:  Capillary refill takes less than 2 seconds.  Neurological:     General: No focal deficit present.     Mental Status: He is alert and oriented to person, place, and time.     Cranial Nerves: Cranial nerves are intact.     Sensory: Sensation is intact.     Motor: Tremor present.     Coordination: Coordination is intact.     Gait: Gait is intact.     Deep Tendon Reflexes: Reflexes are normal and symmetric.  Psychiatric:        Attention and Perception: Attention normal.        Mood and Affect: Mood normal. Affect is flat.        Speech: Speech normal.        Behavior: Behavior is agitated. Behavior is cooperative.        Thought Content: Thought content normal.        Cognition and Memory: Cognition and memory normal.        Judgment: Judgment normal.     Comments: Memory is intact however his mood is flat and a little bit agitated secondary to his nephews concerned about his health.    ASSESSMENT/PLAN:  1. Alcohol dependence with other alcohol-induced disorder (HCC)   2. Centrilobular emphysema (HCC)   3. Nicotine dependence, cigarettes, with other nicotine-induced disorders   4. Lipid screening  5. Essential hypertension  - Ambulatory referral to Cardiology - triamterene-hydrochlorothiazide (MAXZIDE-25) 37.5-25 MG tablet; TAKE (1) TABLET BY MOUTH ONCE DAILY.  Dispense: 90 tablet; Refill: 3  6. Atherosclerosis of native coronary artery of native heart without angina pectoris  - Ambulatory referral to Cardiology  7. Encounter for screening for malignant neoplasm of colon   8. Impaired fasting glucose  9. Enlarged prostate   10. Abnormal EKG  - Ambulatory referral to Cardiology - ECHOCARDIOGRAM COMPLETE; Future   Discussed PSA screening (risks/benefits), recommended at least 30 minutes of aerobic activity at least 5 days/week; proper sunscreen use reviewed; healthy diet and alcohol recommendations (less than or equal to 2 drinks/day) reviewed; regular seatbelt  use; changing batteries in smoke detectors. Immunization recommendations discussed.  Colonoscopy recommendations reviewed.   Medicare Attestation I have personally reviewed: The patient's medical and social history Their use of alcohol, tobacco or illicit drugs Their current medications and supplements The patient's functional ability including ADLs,fall risks, home safety risks, cognitive, and hearing and visual impairment Diet and physical activities Evidence for depression or mood disorders  The patient's weight, height, BMI, and visual acuity have been recorded in the chart.  I have made referrals,  counseling, and provided education to the patient based on review of the above and I have provided the patient with a written personalized care plan for preventive services.     Freddy Finner, NP   05/17/2020

## 2020-05-17 NOTE — Assessment & Plan Note (Signed)
Stable needs refill of nebulizer.  Very concerned for him as he also is smoking ongoing.  2-4 sometimes more cigarettes a day.  Appears to not be as forthcoming on how much he actually smokes on a regular basis.

## 2020-05-17 NOTE — Assessment & Plan Note (Addendum)
In conversation he reports that he does not drink daily however when his nephew is present he reports that he does drink daily.  He did admit to drinking a 12 pack over this past weekend.  He is tremoring today in the office.  As he has not had anything to drink this morning.  Though he denies having any issues or concerns with this.  He denies wanting any assistance or help with this which included offering of medications, therapy and going to [employer]  He does not want to stop drinking at this time.

## 2020-05-17 NOTE — Assessment & Plan Note (Signed)
Referral for colonoscopy made.

## 2020-05-17 NOTE — Assessment & Plan Note (Signed)
Asked about quitting: confirms they are currently smokes cigarettes Advise to quit smoking: Educated about QUITTING to reduce the risk of cancer, cardio and cerebrovascular disease. Assess willingness: Unwilling to quit at this time, but is working on cutting back. Assist with counseling and pharmacotherapy: Counseled for 5 minutes and literature provided. Arrange for follow up:  not quitting follow up in 3 months and continue to offer help.   

## 2020-05-17 NOTE — Patient Outreach (Signed)
[facility]) Care Management  05/17/2020  VERLON PISCHKE 10-Aug-1956 [id number]   Telephone Screen/further assessment and follow up Referral Date:09/04/19 Referral Source:MD referral -Dr [name] office Referral Reason:pt has medicare but can't afford medications does not have drug coverage- has emphysema -Resolved medicare coverage issue in October 2020 Insurance: [employer] Medicare[place]medicaid [place] family planningeffective 11/7/2018SSI disability [employer] medicare effective 10/06/19   Mr angle karel an outpatient surgeryin a [facility] facility on December2020 No ED visits or hospital admissions  Outreach unsuccessful to the home number [phone], line busy Outreach unsuccessful to the mobile number [phone] as [facility] RN CM is not able to leave a message as the voice mailbox has not been set up yet    Plan [facility] RN CM will attempt to follow up with Mr [place] within the next 7-10 business days   Tira L. Noelle Penner, RN, BSN, CCM [facility] Telephonic Care Management Care Coordinator Office number [phone] Mobile number [phone]  Main [facility] number [phone] Fax number [phone]

## 2020-05-17 NOTE — Assessment & Plan Note (Signed)
EKG shows biatrial enlargement, nonspecific T wave abnormality, vent rate of 84 bpm.  Also has been diagnosed with CAD, coronary atherosclerosis.  Is alcohol and smoker. Additionally has been seen to have left VH and it looks like it is demonstrated as well on EKG.  Refer to cardiology for follow-up and echo ordered to make sure nothing is missed.  As compliance is an issue. Patient and nephew are made aware.

## 2020-05-25 ENCOUNTER — Other Ambulatory Visit: Payer: Self-pay | Admitting: *Deleted

## 2020-05-25 ENCOUNTER — Encounter: Payer: Self-pay | Admitting: *Deleted

## 2020-05-25 NOTE — Patient Outreach (Signed)
[facility]) Care Management  05/25/2020  [name] 1956/04/15 [id number]   Second unsuccessful Telephone outreach for further assessment and follow up Referral Date:09/04/19 Referral Source:MD referral -Dr [name] office Referral Reason:pt has medicare but can't afford medications does not have drug coverage- has emphysema -Resolved medicare coverage issue in October 2020 Insurance: [employer] Medicare/medicaid [place] family planningeffective 11/7/2018SSI disability Hum[employer] medicare effective 10/06/19   Nicholas [name] an [facility] facility [facility] December2020 No ED visits or hospital admissions  Outreach unsuccessful to the home number [phone],  No answer. [facility] RN CM left HIPAA [facility] Portability and Accountability Act) compliant voicemail message along with CM's contact info.   Outreach unsuccessful to the mobile number [phone] as [facility] RN CM is not able to leave a message as the voice mailbox has not been set up yet    Plan [facility] RN CM sent a [facility] engaged patient unsuccessful outreach letter and will attempt to follow up with [name] within the next 7-10 business days   [place] L. Noelle Penner, RN, BSN, CCM [facility] Telephonic Care Management Care Coordinator Office number [phone] Mobile number [phone]  Main [facility] number [phone] Fax number [phone]

## 2020-05-25 NOTE — Patient Outreach (Signed)
[facility]) Care Management  05/25/2020  [name] 1956-12-07 [id number]   Telephone outreach for further assessment and follow up Referral Date:09/04/19 Referral Source:MD referral -Dr Tula Nakayama office Referral Reason:pt has medicare but can't afford medications does not have drug coverage- has emphysema -Resolved medicare coverage issue in October 2020 Insurance: [place] family planningeffective 11/7/2018SSI disability Humana medicare effective 10/06/19   [name] an outpatient surgeryin a [facility] facility [facility] [address] No ED visits or hospital admissions  Outreachsuccessfultothe home number [phone],  [name] is able to verify HIPAA ([place] and Accountability Act) identifiers, date of birth (DOB) and address Reviewed with patient the reason for the follow up - eye exam, review 05/07/20 ED visit concerns, review 05/17/20 pcp visit concerns   Follow up Assessment  Missed ophthalmology appointment - [name] reports he was "fifteen minutes late" to the myeyeDr appointment and it had to be rescheduled He does not recall the date at the time of the outreach but recalls it is at "nine in the morning" He reports his mother, Janifer Adie recalls the day and will be transporting him to the appointment  ED visit [name] has not been admitted since December 2020 He went to [place] on 05/07/20 with acute left side low back/buttocks pain He reports the pain was a 9 on 05/07/20 He informs [facility] RN CM that he had "bone spurs" Today he reports the pain as 0. He states the services provided at the ED resolved his pain. [facility] RN CM discussed the importance of contacting his orthopedic MD as recommended by ED MD and/or if the pain symptoms restart He voiced understanding    pcp visit  He confirms primary care provider (PCP) office visit with Cherly Beach. He confirms his nephew who has  graduated from medical school accompanied him. [facility] RN CM discussed the concerns of drinking alcohol and smoking noted during the appointment [name] and "not bothering anyone" for reasons he continues to drink and smoke He confirms when [facility] RN CM inquired that he is "not ready to stop" [facility] RN CM reviewed [facility] SW services to assist and encouraged him to contact [facility] RN CM when he is ready. He was counseled and given literature by pcp office He was noted to have tremor during pcp visit His BP was elevated and referred to cardiology He was referred to gastroenterology for colonoscopy His HgA1c was 5.4 on 05/17/20  [facility] future services  [facility] RN CM discussed [facility] health coach services with [name] His preference is to have [facility] RN CM follow up with him quarterly   [name] was sent EMMI materials for quitting smoking for older adults, effects of alcohol on your health and smoking not just harmful to your lungs and heart  Social: [name] is at 29 year old single retired male on disability who lives with his mother and sister, [name].His father has been deceased for 9 years.He retired 3 years ago (/2017)per his mother as he began to have difficulty breathing. He has no children. He states he was married for 2 years.  He has a brother in the navy He has not been in the [employer]. He admits hecan read buthas difficulty readingand understanding what he reads. He states his mother or sister assists with reading. His mother confirms he can read but does not understand some things he reads. He is independent in his ADLs but  needs assist with his iADLs. He does not drive. 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  He and his mother voices he has "anger problems" He likes football, mowing lawns and painting He drinks and smokes but is not ready to stop  Conditions:COPD per pt- Referral to [facility] reports  (Centrilobular) emphysema, HTN, Coronary atherosclerosis, allergic rhinitis, nicotine dependence, currentsmoker (he reportsoff andon "cause things get on my nerve"), depression with generalizedanxietydisorder, lymphocytosis, neutropenia, vitamin D deficiency, alcohol addiction  WCH:ENIDPOEUM, borrows his mother's BP cuff,general store purchased eyeglasses, oxygen for prn use (not using)  Upcoming appointment  07/29/20 medicare annual wellness visit pcp office    Plan [facility] RN CM will follow up with Nicholas Barlowe within the next 90-97 business daysas he agreed to today  Pt encouraged to return a call to [facility] RN CM prn Routed note to MD   [facility] CM Care Plan Problem One     Most Recent Value  Care Plan Problem One  Medication insurance coverage affects cost  Role Documenting the Problem One  Care Management Telephonic Coordinator  Care Plan for Problem One  Active  [facility] Long Term Goal   over the next 29 day patietn will have insurance coverage for medication benefit or medication assistance as evidence by documentation in EPIC or patient/family verbalizing during contact  [place] Term Goal Start Date  09/04/19  [facility] Long Term Goal Met Date  10/06/19  [facility] CM Short Term Goal #1   over the next 14 day patient will work with his nephew, MD or a pharmacy staff to obtain medicare part D or medication assistance as evidence by report of his MD, family or a pharmacist  [facility] CM Short Term Goal #1 Start Date  09/04/19  [facility] CM Short Term Goal #1 Met Date  10/06/19    [facility] CM Care Plan Problem Two     Most Recent Value  Care Plan Problem Two  transportation to medical appointments  Role Documenting the Problem Two  Care Management Telephonic Coordinator  Care Plan for Problem Two   Active  THN Long Term Goal  over the next 29 days patient will be able to access local medical transportation to medical appointments as evidence by verbalizaton during contact with patient and mother or noted documentation   THN Long Term Goal Start Date  09/04/19  [facility] Long Term Goal Met Date  10/06/19  THN CM Short Term Goal #1   over the next 7 day patient will receive alternate medical transportation options/resouce information as evidence by verbalization during contact  THN CM Short Term Goal #1 Start Date  09/04/19  [facility] CM Short Term Goal #1 Met Date   09/18/19    [facility] CM Care Plan Problem Three     Most Recent Value  Care Plan Problem Three  preventive care -eye care,knowledge of home management for major medical conditions, HTN, COPD  Role Documenting the Problem Three  Care Management Telephonic Coordinator  Care Plan for Problem Three  Active  THN Long Term Goal   over the next 90 days patient will verbalized understanding of home care for HTN, COPD and eye cre preventive care during a follow up call  [place] Term Goal Start Date  05/25/20  Interventions for Problem Three Long Term Goal  Reviwed ED visit, review pcp visit, assessed for eye exam completion, encourage follow up of eye exam, discussed the importance of stopping smoking and etoh intake, sent emmi for quitting smoking, effects of etoh and smoking on health, encouraged follow up with orthopedic MD and reporting worsen back symptoms to orthopedic MD  to prevent admissions  [facility] CM Short Term Goal #1   over the next 30 days the patient will attend his scheduled eye exam and be assisted with glasses prn as evidence by verbalization during follow up outreach  [facility] CM Short Term Goal #1 Start Date  05/25/20  Interventions for Short Term Goal #1  assessed for eye exam completion, encouraged completion of rescheduled appointment  [facility] CM Short Term Goal #2   over the next 30 days patient will received EMMI materials related to his  major medical diagnoses: COPD & HTN as confirmed by verbalization during follow up call   THN CM Short Term Goal #2 Start Date  02/15/20  [facility] CM Short Term Goal #2 Met Date  05/25/20  THN CM Short Term Goal #3   over the next 30 days patient will received EMMI materials related to his smoking & drinking as confirmed by verbalization during follow up call   THN  CM Short Term Goal #3 Start Date  05/25/20  Interventions for Short Term Goal #3  Discussed the importance of stopping use of etoh & smoking, encouragement provided,  sent emmi for quitting smoking, effects of etoh and smoking on health, encouraged follow up with orthopedic MD and reporting worsen back symptoms to orthopedic MD  to prevent admissions     Bravlio Luca L. Lavina Hamman, RN, BSN, [place] Coordinator Office number [phone] Mobile number [phone]  Main [facility] number [phone] Fax number [phone]

## 2020-06-03 ENCOUNTER — Ambulatory Visit: Payer: Self-pay | Admitting: *Deleted

## 2020-06-13 ENCOUNTER — Other Ambulatory Visit: Payer: Self-pay

## 2020-06-13 ENCOUNTER — Ambulatory Visit (HOSPITAL_COMMUNITY)
Admission: RE | Admit: 2020-06-13 | Discharge: 2020-06-13 | Disposition: A | Discharge status: Home / Self Care | Payer: Medicare Other | Source: Ambulatory Visit | Attending: Family Medicine | Admitting: Family Medicine

## 2020-06-13 DIAGNOSIS — R9431 Abnormal electrocardiogram [ECG] [EKG]: Secondary | ICD-10-CM | POA: Diagnosis present

## 2020-06-13 NOTE — Progress Notes (Signed)
*  PRELIMINARY RESULTS* Echocardiogram 2D Echocardiogram has been performed.  Stacey Drain 06/13/2020, 9:19 AM

## 2020-06-17 ENCOUNTER — Ambulatory Visit (INDEPENDENT_AMBULATORY_CARE_PROVIDER_SITE_OTHER): Payer: Medicare Other | Admitting: Cardiology

## 2020-06-17 ENCOUNTER — Encounter: Payer: Self-pay | Admitting: Cardiology

## 2020-06-17 VITALS — BP 144/86 | HR 86 | Ht 71.0 in | Wt 140.6 lb

## 2020-06-17 DIAGNOSIS — Z720 Tobacco use: Secondary | ICD-10-CM

## 2020-06-17 DIAGNOSIS — I2510 Atherosclerotic heart disease of native coronary artery without angina pectoris: Secondary | ICD-10-CM | POA: Diagnosis not present

## 2020-06-17 MED ORDER — ROSUVASTATIN CALCIUM 5 MG PO TABS
5.0000 mg | ORAL_TABLET | Freq: Every day | ORAL | 3 refills | Status: DC
Start: 2020-06-17 — End: 2020-11-30

## 2020-06-17 NOTE — Progress Notes (Signed)
Cardiology Office Note  Date: 06/17/2020   ID: [name], DOB June 07, 1956, MRN [id number]  PCP:  Kerri Perches, MD  Cardiologist:  Nona Dell, MD Electrophysiologist:  None   Chief Complaint  Patient presents with  . History of coronary calcification by CT    History of Present Illness: [name] is a 64 y.o. male referred for cardiology consultation by Ms. Arvilla Market NP with previously documented history of coronary artery calcifications by CT imaging and also recent abnormal ECG.  He presents today reporting no exertional symptoms.  States that he is retired, does do outdoor work, has a garden.  He describes NYHA class II dyspnea, no chest tightness, no palpitations or syncope.  I reviewed the results of his last chest CT from 2016. Recent echocardiogram from June 28 showed LVEF 55 to 60% with mild diastolic dysfunction, normal RV contraction, no significant valvular abnormalities.  We went over his medications today.  He states that he takes aspirin regularly.  Also Maxide, blood pressure is mildly elevated today.  His most recent lab work shows LDL of 84 with total cholesterol 209 and HDL 109.  LFTs normal.  I reviewed his tracings over time.  We discussed the implications of coronary atherosclerosis, although he is asymptomatic at this time, risk factor reduction is recommended.  We discussed low-dose statin therapy to get his LDL under 70, he also needs to follow-up with PCP to make sure the blood pressure is adequately controlled.  He would be a reasonable candidate for ARB.  I also talked with him about alcohol and tobacco use.  He states that he is trying to cut back on both.  Past Medical History:  Diagnosis Date  . Alcohol abuse   . Allergic rhinitis   . Asthma   . COPD (chronic obstructive pulmonary disease) (HCC)   . Coronary atherosclerosis    Based on chest CT 2012  . Essential hypertension   . History of neutropenia    Also lymphocytosis -  followed by Dr. Zigmund Daniel  . Nicotine addiction   . Olecranon bursitis, right elbow     Past Surgical History:  Procedure Laterality Date  . MULTIPLE TOOTH EXTRACTIONS  June 2012  . OLECRANON BURSECTOMY Right 11/26/2019   Procedure: OLECRANON BURSECTOMY;  Surgeon: Vickki Hearing, MD;  Location: [facility];  Service: Orthopedics;  Laterality: Right;  pt knows to arrive at 6:15    Current Outpatient Medications  Medication Sig Dispense Refill  . albuterol (VENTOLIN HFA) 108 (90 Base) MCG/ACT inhaler Inhale 2 puffs into the lungs every 6 (six) hours as needed for wheezing or shortness of breath. 16 g 2  . aspirin EC 81 MG tablet Take 81 mg by mouth daily.    . busPIRone (BUSPAR) 5 MG tablet Take 1 tablet (5 mg total) by mouth 3 (three) times daily. 270 tablet 3  . ipratropium-albuterol (DUONEB) 0.5-2.5 (3) MG/3ML SOLN Every 6 hours as needed. 90 mL 0  . mometasone-formoterol (DULERA) 200-5 MCG/ACT AERO Inhale 2 puffs into the lungs 2 (two) times daily. 13 g 3  . triamterene-hydrochlorothiazide (MAXZIDE-25) 37.5-25 MG tablet TAKE (1) TABLET BY MOUTH ONCE DAILY. 90 tablet 3  . rosuvastatin (CRESTOR) 5 MG tablet Take 1 tablet (5 mg total) by mouth daily. 90 tablet 3   No current facility-administered medications for this visit.   Allergies:  Lisinopril   Social History: The patient  reports that he has been smoking cigarettes. He has a 19.00 pack-year smoking  history. He has never used smokeless tobacco. He reports current alcohol use of about 18.0 standard drinks of alcohol per week. He reports that he does not use drugs.   Family History: The patient's family history includes Diabetes in his father and mother; Hypertension in his mother and sister.   ROS:   No palpitations or syncope, no claudication.  Physical Exam: VS:  BP (!) 144/86 (BP Location: Right Arm)   Pulse 86   Ht 5\' 11"  (1.803 m)   Wt 140 lb 9.6 oz (63.8 kg)   SpO2 97%   BMI 19.61 kg/m , BMI Body mass index is 19.61  kg/m.  Wt Readings from Last 3 Encounters:  06/17/20 140 lb 9.6 oz (63.8 kg)  05/17/20 141 lb (64 kg)  05/07/20 145 lb (65.8 kg)    General: Patient appears comfortable at rest. HEENT: Conjunctiva and lids normal, wearing a mask. Neck: Supple, no elevated JVP or carotid bruits, no thyromegaly. Lungs: Decreased breath sounds with soft and expiratory wheeze, nonlabored breathing at rest. Cardiac: Regular rate and rhythm, no S3 or significant systolic murmur, no pericardial rub. Abdomen: Soft, nontender, bowel sounds present. Extremities: No pitting edema, distal pulses 2+. Skin: Warm and dry. Musculoskeletal: No kyphosis. Neuropsychiatric: Alert and oriented x3, affect grossly appropriate.  ECG:  An ECG dated 05/17/2020 was personally reviewed today and demonstrated:  Sinus rhythm with possible biatrial enlargement, nonspecific ST changes, borderline prolonged QTc.  Recent Labwork: 05/17/2020: ALT 20; AST 23; BUN 18; Creat 1.01; Hemoglobin 15.5; Platelets 329; Potassium 5.1; Sodium 142; TSH 1.80     Component Value Date/Time   CHOL 209 (H) 05/17/2020 1119   TRIG 74 05/17/2020 1119   HDL 109 05/17/2020 1119   CHOLHDL 1.9 05/17/2020 1119   VLDL 21 11/22/2016 0955   LDLCALC 84 05/17/2020 1119    Other Studies Reviewed Today:  Chest CT 11/04/2015: FINDINGS: Mediastinum / Lymph Nodes: There is no axillary lymphadenopathy. No mediastinal lymphadenopathy. There is no hilar lymphadenopathy. The heart size is normal. No pericardial effusion. Coronary artery calcification is noted. The esophagus has normal imaging features.  Lungs / Pleura: Centrilobular and paraseptal emphysema noted bilaterally. Biapical pleural-parenchymal scarring is evident. There is bronchial wall thickening with scattered areas of small airway impaction compatible with mucous plugging. Several scattered tiny pulmonary nodules are evident, the largest of which is seen in the peripheral right upper lobe (image 63  series 3). This particular nodule has a volume derived equivalent diameter of 2.6 mm. No focal airspace consolidation. No pulmonary edema or pleural effusion.  Upper Abdomen:  Unremarkable.  MSK / Soft Tissues: Bone windows reveal no worrisome lytic or sclerotic osseous lesions.  IMPRESSION: Lung-RADS Category 2, benign appearance or behavior. Continue annual screening with low-dose chest CT without contrast in 12 months.  Coronary artery atherosclerosis.  Emphysema.  Echocardiogram 06/13/2020: 1. Left ventricular ejection fraction, by estimation, is 55 to 60%. The  left ventricle has normal function. The left ventricle has no regional  wall motion abnormalities. Left ventricular diastolic parameters are  consistent with Grade I diastolic  dysfunction (impaired relaxation).  2. Right ventricular systolic function is normal. The right ventricular  size is normal.  3. The mitral valve is normal in structure. No evidence of mitral valve  regurgitation. No evidence of mitral stenosis.  4. The aortic valve is tricuspid. Aortic valve regurgitation is not  visualized. No aortic stenosis is present.  5. The inferior vena cava is normal in size with greater than 50%  respiratory variability, suggesting right atrial pressure of 3 mmHg.   Assessment and Plan:  1.  Coronary artery calcification by CT imaging, last study was in 2016.  ECG is chronically abnormal.  He does not describe any exertional chest pain or increasing shortness of breath.  We discussed risk factor modification at this point.  He will continue aspirin, starting on Crestor 5 mg daily to get LDL under 70 with atherosclerosis.  Continue to follow with PCP has regards to blood pressure control, he might be a good candidate for ARB.  2.  Longstanding tobacco abuse, we did discuss smoking cessation today.  He has cut back but has not been able to quit over time.  3.  Regular alcohol intake, I discussed moderation  with him.   Medication Adjustments/Labs and Tests Ordered: Current medicines are reviewed at length with the patient today.  Concerns regarding medicines are outlined above.   Tests Ordered: Orders Placed This Encounter  Procedures  . Hepatic function panel  . Lipid Profile    Medication Changes: Meds ordered this encounter  Medications  . rosuvastatin (CRESTOR) 5 MG tablet    Sig: Take 1 tablet (5 mg total) by mouth daily.    Dispense:  90 tablet    Refill:  3    Disposition:  Follow up 1 year in the [place] office.  Signed, Jonelle Sidle, MD, [facility] 06/17/2020 9:26 AM    [facility] at [facility] [address], [place], [place] [address] Phone: [phone]; Fax: [phone]

## 2020-06-17 NOTE — Patient Instructions (Addendum)
Medication Instructions:  START Crestor 5 mg daily at dinner  *If you need a refill on your cardiac medications before your next appointment, please call your pharmacy*   Lab Work: Fasting Lipids and Liver function test in 8 weeks  If you have labs (blood work) drawn today and your tests are completely normal, you will receive your results only by: [name] MyChart Message (if you have MyChart) OR . A paper copy in the mail If you have any lab test that is abnormal or we need to change your treatment, we will call you to review the results.   Testing/Procedures: None today   Follow-Up: At [facility], you and your health needs are our priority.  As part of our continuing mission to provide you with exceptional heart care, we have created designated Provider Care Teams.  These Care Teams include your primary Cardiologist (physician) and Advanced Practice Providers (APPs -  Physician Assistants and Nurse Practitioners) who all work together to provide you with the care you need, when you need it.  We recommend signing up for the patient portal called "MyChart".  Sign up information is provided on this After Visit Summary.  MyChart is used to connect with patients for Virtual Visits (Telemedicine).  Patients are able to view lab/test results, encounter notes, upcoming appointments, etc.  Non-urgent messages can be sent to your provider as well.   To learn more about what you can do with MyChart, go to [email or web address].    Your next appointment:   12 month(s)  The format for your next appointment:   In Person  Provider:   Nona Dell, MD   Other Instructions None     Thank you for choosing [facility] !

## 2020-07-12 ENCOUNTER — Other Ambulatory Visit: Payer: Self-pay

## 2020-07-12 ENCOUNTER — Ambulatory Visit (INDEPENDENT_AMBULATORY_CARE_PROVIDER_SITE_OTHER): Payer: Medicare Other | Admitting: Family Medicine

## 2020-07-12 ENCOUNTER — Encounter: Payer: Self-pay | Admitting: Family Medicine

## 2020-07-12 VITALS — BP 152/88 | HR 75 | Temp 97.3°F | Resp 16 | Ht 71.0 in | Wt 138.8 lb

## 2020-07-12 DIAGNOSIS — F10288 Alcohol dependence with other alcohol-induced disorder: Secondary | ICD-10-CM

## 2020-07-12 DIAGNOSIS — F17218 Nicotine dependence, cigarettes, with other nicotine-induced disorders: Secondary | ICD-10-CM | POA: Diagnosis not present

## 2020-07-12 DIAGNOSIS — I10 Essential (primary) hypertension: Secondary | ICD-10-CM | POA: Diagnosis not present

## 2020-07-12 MED ORDER — AMLODIPINE BESYLATE 5 MG PO TABS: 5.0000 mg | ORAL_TABLET | Freq: Every day | ORAL | 1 refills | Status: DC

## 2020-07-12 NOTE — Assessment & Plan Note (Signed)
Reports not taking meds daily. Cards note reviewed, starting Norvasc, as swelling risk with ARB secondary to ACE reaction. Encouraged him to put medication by the bathroom sink to help him remember.  He also reports he is working on smoking and drinking less.  He is applauded for this.

## 2020-07-12 NOTE — Assessment & Plan Note (Signed)
He is working to reduce his drinking. He wants to stop.  He was encouraged to continue this and applauded for taking action.

## 2020-07-12 NOTE — Assessment & Plan Note (Addendum)
He wants to stop. He has reduced the amount.  Asked about quitting: confirms they are currently smokes cigarettes Advise to quit smoking: Educated about QUITTING to reduce the risk of cancer, cardio and cerebrovascular disease. Assess willingness: willing to quit at this time, but is working on cutting back first. Assist with counseling and pharmacotherapy: Counseled for 5 minutes and literature provided. Arrange for follow up: working on quitting follow up in 3 months and continue to offer help.

## 2020-07-12 NOTE — Progress Notes (Signed)
Subjective:  Patient ID: [name], male    DOB: [birth year]  Age: 64 y.o. MRN: [id number]  CC:  Chief Complaint  Patient presents with  . Follow-up    8-10 week follow up bp still elevated but he stated he was nervouse coming to dr office.       HPI  HPI  [name] is a 64 year old male patient of [name].  Presents today for follow-up on blood pressure.  He reports that he is nervous coming into the office.  However when I speak to him he admits that he has not been taking his medications consistently regularly.  He is missed the last several days except for today.  He had an allergy with angioedema to ACE.  So therefore an ARB has not been started.  I did send him to cardiology secondary to abnormal EKG his echo looked good and overall he is doing well.  He reports that he would like to take his medication regularly however he just sometimes forgets.  He reports he has been working on reducing alcohol and smoking.  He wants to be able to quit the soon.  He is trying to take his health more seriously.  He is willing to have an additional agent added.  And to follow-up shortly.  Today patient denies signs and symptoms of COVID 19 infection including fever, chills, cough, shortness of breath, and headache. Past Medical, Surgical, Social History, Allergies, and Medications have been Reviewed.   Past Medical History:  Diagnosis Date  . Alcohol abuse   . Allergic rhinitis   . Asthma   . COPD (chronic obstructive pulmonary disease) (HCC)   . Coronary atherosclerosis    Based on chest CT 2012  . Essential hypertension   . History of neutropenia    Also lymphocytosis - followed by [name]  . Nicotine addiction   . Olecranon bursitis, right elbow     Current Meds  Medication Sig  . albuterol (VENTOLIN HFA) 108 (90 Base) MCG/ACT inhaler Inhale 2 puffs into the lungs every 6 (six) hours as needed for wheezing or shortness of breath.  [name] aspirin EC 81 MG tablet  Take 81 mg by mouth daily.  . busPIRone (BUSPAR) 5 MG tablet Take 1 tablet (5 mg total) by mouth 3 (three) times daily.  [name] ipratropium-albuterol (DUONEB) 0.5-2.5 (3) MG/3ML SOLN Every 6 hours as needed.  . mometasone-formoterol (DULERA) 200-5 MCG/ACT AERO Inhale 2 puffs into the lungs 2 (two) times daily.  . rosuvastatin (CRESTOR) 5 MG tablet Take 1 tablet (5 mg total) by mouth daily.  [name] triamterene-hydrochlorothiazide (MAXZIDE-25) 37.5-25 MG tablet TAKE (1) TABLET BY MOUTH ONCE DAILY.    ROS:  Review of Systems  Constitutional: Negative.   HENT: Negative.   Eyes: Negative.   Respiratory: Negative.   Cardiovascular: Negative.   Gastrointestinal: Negative.   Genitourinary: Negative.   Musculoskeletal: Negative.   Skin: Negative.   Neurological: Negative.   Endo/Heme/Allergies: Negative.   Psychiatric/Behavioral: Negative.   All other systems reviewed and are negative.   Objective:   Today's Vitals: BP (!) 152/88 (BP Location: Right Arm, Patient Position: Sitting, Cuff Size: Normal)   Pulse 75   Temp (!) 97.3 F (36.3 C) (Temporal)   Resp 16   Ht 5\' 11"  (1.803 m)   Wt 138 lb 12.8 oz (63 kg)   SpO2 96%   BMI 19.36 kg/m  Vitals with BMI 07/12/2020 06/17/2020 06/17/2020  Height 5\' 11"  - 5'  11"  Weight 138 lbs 13 oz - 140 lbs 10 oz  BMI 19.37 - 19.62  Systolic 152 144 157  Diastolic 88 86 82  Pulse 75 - 86     Physical Exam Vitals and nursing note reviewed.  Constitutional:      Appearance: Normal appearance. He is well-developed, well-groomed and overweight.  HENT:     Head: Normocephalic and atraumatic.     Right Ear: External ear normal.     Left Ear: External ear normal.     Mouth/Throat:     Comments: Mask in place  Eyes:     General:        Right eye: No discharge.        Left eye: No discharge.     Conjunctiva/sclera: Conjunctivae normal.  Cardiovascular:     Rate and Rhythm: Normal rate and regular rhythm.     Pulses: Normal pulses.     Heart sounds:  Normal heart sounds.  Pulmonary:     Effort: Pulmonary effort is normal.     Breath sounds: Normal breath sounds.  Musculoskeletal:        General: Normal range of motion.     Cervical back: Normal range of motion and neck supple.  Skin:    General: Skin is warm.  Neurological:     General: No focal deficit present.     Mental Status: He is alert and oriented to person, place, and time.  Psychiatric:        Attention and Perception: Attention normal.        Mood and Affect: Mood normal.        Speech: Speech normal.        Behavior: Behavior normal. Behavior is cooperative.        Thought Content: Thought content normal.        Cognition and Memory: Cognition normal.        Judgment: Judgment normal.     Assessment   1. Essential hypertension   2. Nicotine dependence, cigarettes, with other nicotine-induced disorders   3. Alcohol dependence with other alcohol-induced disorder (HCC)     Tests ordered No orders of the defined types were placed in this encounter.   Plan: Please see assessment and plan per problem list above.   Meds ordered this encounter  Medications  . amLODipine (NORVASC) 5 MG tablet    Sig: Take 1 tablet (5 mg total) by mouth daily.    Dispense:  60 tablet    Refill:  1    Order Specific Question:   Supervising Provider    Answer:   [name]    Patient to follow-up in 07/29/2020  Nicholas Finner, NP

## 2020-07-12 NOTE — Patient Instructions (Addendum)
I appreciate the opportunity to provide you with care for your health and wellness. Today we discussed: BP check in  Follow up: 7-8 weeks for BP check   No labs or referrals today  YOU ARE ROCKING IT! KEEP UP THE GOOD WORK IN SMOKING AND DRINKING LESS!!!!  Put medication bottles next to sink so you can remember to take them daily.  Please continue to practice social distancing to keep you, your family, and our community safe.  If you must go out, please wear a mask and practice good handwashing.  It was a pleasure to see you and I look forward to continuing to work together on your health and well-being. Please do not hesitate to call the office if you need care or have questions about your care.  Have a wonderful day and week. With Gratitude, Tereasa Coop, DNP, AGNP-BC

## 2020-07-29 ENCOUNTER — Telehealth (INDEPENDENT_AMBULATORY_CARE_PROVIDER_SITE_OTHER): Payer: Medicare Other | Admitting: Family Medicine

## 2020-07-29 ENCOUNTER — Encounter: Payer: Self-pay | Admitting: Family Medicine

## 2020-07-29 ENCOUNTER — Other Ambulatory Visit: Payer: Self-pay

## 2020-07-29 VITALS — BP 152/88 | Ht 71.0 in | Wt 139.0 lb

## 2020-07-29 DIAGNOSIS — Z1211 Encounter for screening for malignant neoplasm of colon: Secondary | ICD-10-CM | POA: Diagnosis not present

## 2020-07-29 DIAGNOSIS — Z0000 Encounter for general adult medical examination without abnormal findings: Secondary | ICD-10-CM

## 2020-07-29 NOTE — Progress Notes (Addendum)
Subjective:   [name] is a 64 y.o. male who presents for Medicare Annual/Subsequent preventive examination.  Method of visit: Telephone  Location of Patient: Home Location of Provider: Office Consent was obtain for visit to be over via a telehealth platform. Services rendered by provider: Visit was performed via telephone/audio only   I verified that I am speaking with the correct person using two identifiers.   Review of Systems    yes Cardiac Risk Factors include: none     Objective:    Today's Vitals   07/29/20 0904  BP: (!) 152/88  Weight: 139 lb (63 kg)  Height: 5\' 11"  (1.803 m)  PainSc: 0-No pain   Body mass index is 19.39 kg/m.  Advanced Directives 07/29/2020 07/29/2020 05/25/2020 05/07/2020 11/23/2019 09/04/2019 12/31/2017  Does Patient Have a Medical Advance Directive? - Yes No No No No No  Type of Estate agentAdvance Directive Healthcare Power of [place];Living will - - - - - -  Does patient want to make changes to medical advance directive? Yes (MAU/Ambulatory/Procedural Areas - Information given) Yes (MAU/Ambulatory/Procedural Areas - Information given) - - - - -  Copy of Healthcare Power of Attorney in Chart? No - copy requested - - - - - -  Would patient like information on creating a medical advance directive? - - No - Patient declined - No - Patient declined No - Patient declined No - Patient declined    Current Medications (verified) Outpatient Encounter Medications as of 07/29/2020  Medication Sig  . albuterol (VENTOLIN HFA) 108 (90 Base) MCG/ACT inhaler Inhale 2 puffs into the lungs every 6 (six) hours as needed for wheezing or shortness of breath.  [name]. amLODipine (NORVASC) 5 MG tablet Take 1 tablet (5 mg total) by mouth daily.  [name]. aspirin EC 81 MG tablet Take 81 mg by mouth daily.  . busPIRone (BUSPAR) 5 MG tablet Take 1 tablet (5 mg total) by mouth 3 (three) times daily.  [name]. ipratropium-albuterol (DUONEB) 0.5-2.5 (3) MG/3ML SOLN Every 6 hours as needed.  .  mometasone-formoterol (DULERA) 200-5 MCG/ACT AERO Inhale 2 puffs into the lungs 2 (two) times daily.  . rosuvastatin (CRESTOR) 5 MG tablet Take 1 tablet (5 mg total) by mouth daily.  [name]. triamterene-hydrochlorothiazide (MAXZIDE-25) 37.5-25 MG tablet TAKE (1) TABLET BY MOUTH ONCE DAILY.   No facility-administered encounter medications on file as of 07/29/2020.    Allergies (verified) Lisinopril   History: Past Medical History:  Diagnosis Date  . Alcohol abuse   . Allergic rhinitis   . Asthma   . COPD (chronic obstructive pulmonary disease) (HCC)   . Coronary atherosclerosis    Based on chest CT 2012  . Essential hypertension   . History of neutropenia    Also lymphocytosis - followed by Dr. Zigmund DanielFormanek  . Nicotine addiction   . Olecranon bursitis, right elbow    Past Surgical History:  Procedure Laterality Date  . MULTIPLE TOOTH EXTRACTIONS  June 2012  . OLECRANON BURSECTOMY Right 11/26/2019   Procedure: OLECRANON BURSECTOMY;  Surgeon: Vickki HearingHarrison, Stanley E, MD;  Location: [facility];  Service: Orthopedics;  Laterality: Right;  pt knows to arrive at 6:15   Family History  Problem Relation Age of Onset  . Diabetes Mother   . Hypertension Mother   . Diabetes Father   . Hypertension Sister    Social History   Socioeconomic History  . Marital status: Single    Spouse name: none  . Number of children: 0  . Years of  education: Not on file  . Highest education level: Not on file  Occupational History  . Occupation: unemployed, retired     Comment: retired in ~ 2017 from [employer]  Tobacco Use  . Smoking status: Current Every Day Smoker    Packs/day: 0.50    Years: 38.00    Pack years: 19.00    Types: Cigarettes    Last attempt to quit: 12/02/2017    Years since quitting: 2.6  . Smokeless tobacco: Never Used  Vaping Use  . Vaping Use: Never used  Substance and Sexual Activity  . Alcohol use: Yes    Alcohol/week: 18.0 standard drinks    Types: 18 Cans of beer per week      Comment: quit before Christmas 2018  . Drug use: No  . Sexual activity: Not on file  Other Topics Concern  . Not on file  Social History Narrative   [name] is at 64 year old single retired male on disability who lives with his mother and sister. 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 Difficulty of Paying Living Expenses: Somewhat hard  Food Insecurity: No Food Insecurity  . Worried About Programme researcher, broadcasting/film/video in the Last Year: Never true  . Ran Out of Food in the Last Year: Never true  Transportation Needs: No Transportation Needs  . Lack of Transportation (Medical): No  . Lack of Transportation (Non-Medical): No  Physical Activity: Inactive  . Days of Exercise per Week: 0 days  . Minutes of Exercise per Session: 0 min  Stress: No Stress Concern Present  . Feeling of Stress : Only a little  Social Connections: Moderately Isolated  . Frequency of Communication with Friends and Family: Twice a week  . Frequency of Social Gatherings with Friends and Family: Twice a week  . Attends Religious Services: More than 4 times per year  . Active Member of Clubs or Organizations: No  . Attends Banker Meetings: Never  . Marital Status: Never married    Tobacco Counseling Ready to quit: Yes Counseling given: Yes   Clinical Intake:  Pre-visit preparation completed: No  Pain : No/denies pain Pain Score: 0-No pain     Nutritional Status: BMI of 19-24  Normal Nutritional Risks: None Diabetes: No  How often do you need to have someone help you when you read instructions, pamphlets, or other written materials from your doctor or pharmacy?: 1 - Never What is the last grade level you completed in school?: 11  Diabetic?no  Interpreter Needed?: No      Activities of Daily  Living In your present state of health, do you have any difficulty performing the following activities: 07/29/2020 05/25/2020  Hearing? N N  Vision? N Y  Difficulty concentrating or making decisions? N N  Walking or climbing stairs? N N  Dressing or bathing? N N  Doing errands, shopping? Malvin Johns  Preparing Food and eating ? N N  Using the Toilet? N N  In the past six months, have you accidently leaked urine? N N  Do you have problems with loss of bowel control? N N  Managing your Medications? N N  Managing your Finances? N N  Housekeeping or managing your Housekeeping? N N  Some recent data might be hidden  Patient Care Team: Kerri Perches, MD as PCP - General Diona Browner Illene Bolus, MD as PCP - Cardiology (Cardiology) Clinton Gallant, RN as [facility] Care Management Vickki Hearing, MD as Consulting Physician (Orthopedic Surgery) Freddy Finner, NP as Nurse Practitioner (Family Medicine)  Indicate any recent Medical Services you may have received from other than [facility] providers in the past year (date may be approximate).     Assessment:   This is a routine wellness examination for [place].  Hearing/Vision screen No exam data present  Dietary issues and exercise activities discussed: Current Exercise Habits: Home exercise routine, Type of exercise: walking, Time (Minutes): 30, Frequency (Times/Week): 4, Weekly Exercise (Minutes/Week): 120, Intensity: Mild, Exercise limited by: None identified  Goals    . Quit smoking / using tobacco      Depression Screen PHQ 2/9 Scores 07/29/2020 07/12/2020 05/25/2020 05/17/2020 02/15/2020 01/27/2020 12/21/2019  PHQ - 2 Score 0 0 0 0 0 0 0  PHQ- 9 Score - - - 0 - - -    Fall Risk Fall Risk  07/29/2020 07/12/2020 05/25/2020 02/15/2020 01/27/2020  Falls in the past year? 0 0 0 0 0  Number falls in past yr: 0 0 0 0 0  Injury with Fall? 0 0 0 0 0  Risk for fall due to : No Fall Risks No Fall Risks - - -  Follow up Falls evaluation  completed Falls evaluation completed Falls evaluation completed;Falls prevention discussed Falls evaluation completed;Falls prevention discussed -    Any stairs in or around the home? Yes  If so, are there any without handrails? Yes  Home free of loose throw rugs in walkways, pet beds, electrical cords, etc? Yes  Adequate lighting in your home to reduce risk of falls? Yes   ASSISTIVE DEVICES UTILIZED TO PREVENT FALLS:  Life alert? No  Use of a cane, walker or w/c? No  Grab bars in the bathroom? No  Shower chair or bench in shower? No  Elevated toilet seat or a handicapped toilet? No   TIMED UP AND GO:  Was the test performed? No .  Length of time to ambulate 10 feet: NA sec.     Cognitive Function:     6CIT Screen 07/29/2020 07/29/2019  What Year? 0 points 0 points  What month? 0 points 0 points  What time? 0 points 0 points  Count back from 20 0 points 0 points  Months in reverse 0 points 2 points  Repeat phrase 10 points 0 points  Total Score 10 2    Immunizations Immunization History  Administered Date(s) Administered  . Influenza,inj,Quad PF,6+ Mos 10/15/2013, 09/27/2014, 09/21/2015, 09/23/2018, 08/10/2019  . Influenza-Unspecified 10/08/2017  . Moderna SARS-COVID-2 Vaccination 03/29/2020, 04/26/2020  . Pneumococcal Conjugate-13 06/22/2014  . Pneumococcal Polysaccharide-23 12/04/2012  . Tdap 05/09/2011    TDAP status: Up to date Flu Vaccine status: Up to date Pneumococcal vaccine status: Up to date Covid-19 vaccine status: Completed vaccines  Qualifies for Shingles Vaccine? Yes   Zostavax completed No   Shingrix Completed?: No.    Education has been provided regarding the importance of this vaccine. Patient has been advised to call insurance company to determine out of pocket expense if they have not yet received this vaccine. Advised may also receive vaccine at local pharmacy or Health Dept. Verbalized acceptance and understanding.  Screening Tests Health  Maintenance  Topic Date Due  . COLONOSCOPY  08/05/2019  . INFLUENZA VACCINE  07/17/2020  . TETANUS/TDAP  05/08/2021  .  COVID-19 Vaccine  Completed  . Hepatitis C Screening  Completed  . HIV Screening  Completed    Health Maintenance  Health Maintenance Due  Topic Date Due  . COLONOSCOPY  08/05/2019  . INFLUENZA VACCINE  07/17/2020    Colorectal cancer screening: Completed 08-04-09. Repeat every 10 years  Lung Cancer Screening: (Low Dose CT Chest recommended if Age 23-80 years, 30 pack-year currently smoking OR have quit w/in 15years.) does qualify.   Lung Cancer Screening Referral: yes  Additional Screening:  Hepatitis C Screening: does not qualify;   Vision Screening: Recommended annual ophthalmology exams for early detection of glaucoma and other disorders of the eye. Is the patient up to date with their annual eye exam?  Yes  Who is the provider or what is the name of the office in which the patient attends annual eye exams? [facility] Dr Charise Killian If pt is not established with a provider, would they like to be referred to a provider to establish care? No .   Dental Screening: Recommended annual dental exams for proper oral hygiene  Community Resource Referral / Chronic Care Management: CRR required this visit?  No   CCM required this visit?  No      Plan:     1. Encounter for Medicare annual wellness exam  2. Encounter for screening for malignant neoplasm of colon  - Ambulatory referral to Gastroenterology   I have personally reviewed and noted the following in the patient's chart:   . Medical and social history . Use of alcohol, tobacco or illicit drugs  . Current medications and supplements . Functional ability and status . Nutritional status . Physical activity . Advanced directives . List of other physicians . Hospitalizations, surgeries, and ER visits in previous 12 months . Vitals . Screenings to include cognitive, depression, and  falls . Referrals and appointments  In addition, I have reviewed and discussed with patient certain preventive protocols, quality metrics, and best practice recommendations. A written personalized care plan for preventive services as well as general preventive health recommendations were provided to patient.     Freddy Finner, NP   07/29/2020   I provided  20 minutes of non-face-to-face time during this encounter.

## 2020-07-29 NOTE — Patient Instructions (Signed)
[name] , Thank you for taking time to come for your Medicare Wellness Visit. I appreciate your ongoing commitment to your health goals. Please review the following plan we discussed and let me know if I can assist you in the future.   Please continue to practice social distancing to keep you, your family, and our community safe.  If you must go out, please wear a Mask and practice good handwashing.  Screening recommendations/referrals: Colonoscopy: Due Recommended yearly ophthalmology/optometry visit for glaucoma screening and checkup Recommended yearly dental visit for hygiene and checkup  Vaccinations: up to date  Conditions/risks identified: Falls, Smoking  Next appointment: 08/30/2020   Preventive Care 40-64 Years, Male Preventive care refers to lifestyle choices and visits with your health care provider that can promote health and wellness. What does preventive care include?  A yearly physical exam. This is also called an annual well check.  Dental exams once or twice a year.  Routine eye exams. Ask your health care provider how often you should have your eyes checked.  Personal lifestyle choices, including:  Daily care of your teeth and gums.  Regular physical activity.  Eating a healthy diet.  Avoiding tobacco and drug use.  Limiting alcohol use.  Practicing safe sex.  Taking low-dose aspirin every day starting at age 48. What happens during an annual well check? The services and screenings done by your health care provider during your annual well check will depend on your age, overall health, lifestyle risk factors, and family history of disease. Counseling  Your health care provider may ask you questions about your:  Alcohol use.  Tobacco use.  Drug use.  Emotional well-being.  Home and relationship well-being.  Sexual activity.  Eating habits.  Work and work Astronomer. Screening  You may have the following tests or  measurements:  Height, weight, and BMI.  Blood pressure.  Lipid and cholesterol levels. These may be checked every 5 years, or more frequently if you are over 19 years old.  Skin check.  Lung cancer screening. You may have this screening every year starting at age 64 if you have a 30-pack-year history of smoking and currently smoke or have quit within the past 15 years.  Fecal occult blood test (FOBT) of the stool. You may have this test every year starting at age 41.  Flexible sigmoidoscopy or colonoscopy. You may have a sigmoidoscopy every 5 years or a colonoscopy every 10 years starting at age 47.  Prostate cancer screening. Recommendations will vary depending on your family history and other risks.  Hepatitis C blood test.  Hepatitis B blood test.  Sexually transmitted disease (STD) testing.  Diabetes screening. This is done by checking your blood sugar (glucose) after you have not eaten for a while (fasting). You may have this done every 1-3 years. Discuss your test results, treatment options, and if necessary, the need for more tests with your health care provider. Vaccines  Your health care provider may recommend certain vaccines, such as:  Influenza vaccine. This is recommended every year.  Tetanus, diphtheria, and acellular pertussis (Tdap, Td) vaccine. You may need a Td booster every 10 years.  Zoster vaccine. You may need this after age 35.  Pneumococcal 13-valent conjugate (PCV13) vaccine. You may need this if you have certain conditions and have not been vaccinated.  Pneumococcal polysaccharide (PPSV23) vaccine. You may need one or two doses if you smoke cigarettes or if you have certain conditions. Talk to your health care provider about which  screenings and vaccines you need and how often you need them. This information is not intended to replace advice given to you by your health care provider. Make sure you discuss any questions you have with your health care  provider. Document Released: 12/30/2015 Document Revised: 08/22/2016 Document Reviewed: 10/04/2015 Elsevier Interactive Patient Education  2017 [employer] Prevention in the Home Falls can cause injuries. They can happen to people of all ages. There are many things you can do to make your home safe and to help prevent falls. What can I do on the outside of my home?  Regularly fix the edges of walkways and driveways and fix any cracks.  Remove anything that might make you trip as you walk through a door, such as a raised step or threshold.  Trim any bushes or trees on the path to your home.  Use bright outdoor lighting.  Clear any walking paths of anything that might make someone trip, such as rocks or tools.  Regularly check to see if handrails are loose or broken. Make sure that both sides of any steps have handrails.  Any raised decks and porches should have guardrails on the edges.  Have any leaves, snow, or ice cleared regularly.  Use sand or salt on walking paths during winter.  Clean up any spills in your garage right away. This includes oil or grease spills. What can I do in the bathroom?  Use night lights.  Install grab bars by the toilet and in the tub and shower. Do not use towel bars as grab bars.  Use non-skid mats or decals in the tub or shower.  If you need to sit down in the shower, use a plastic, non-slip stool.  Keep the floor dry. Clean up any water that spills on the floor as soon as it happens.  Remove soap buildup in the tub or shower regularly.  Attach bath mats securely with double-sided non-slip rug tape.  Do not have throw rugs and other things on the floor that can make you trip. What can I do in the bedroom?  Use night lights.  Make sure that you have a light by your bed that is easy to reach.  Do not use any sheets or blankets that are too big for your bed. They should not hang down onto the floor.  Have a firm chair that has  side arms. You can use this for support while you get dressed.  Do not have throw rugs and other things on the floor that can make you trip. What can I do in the kitchen?  Clean up any spills right away.  Avoid walking on wet floors.  Keep items that you use a lot in easy-to-reach places.  If you need to reach something above you, use a strong step stool that has a grab bar.  Keep electrical cords out of the way.  Do not use floor polish or wax that makes floors slippery. If you must use wax, use non-skid floor wax.  Do not have throw rugs and other things on the floor that can make you trip. What can I do with my stairs?  Do not leave any items on the stairs.  Make sure that there are handrails on both sides of the stairs and use them. Fix handrails that are broken or loose. Make sure that handrails are as long as the stairways.  Check any carpeting to make sure that it is firmly attached to the stairs.  Fix any carpet that is loose or worn.  Avoid having throw rugs at the top or bottom of the stairs. If you do have throw rugs, attach them to the floor with carpet tape.  Make sure that you have a light switch at the top of the stairs and the bottom of the stairs. If you do not have them, ask someone to add them for you. What else can I do to help prevent falls?  Wear shoes that:  Do not have high heels.  Have rubber bottoms.  Are comfortable and fit you well.  Are closed at the toe. Do not wear sandals.  If you use a stepladder:  Make sure that it is fully opened. Do not climb a closed stepladder.  Make sure that both sides of the stepladder are locked into place.  Ask someone to hold it for you, if possible.  Clearly mark and make sure that you can see:  Any grab bars or handrails.  First and last steps.  Where the edge of each step is.  Use tools that help you move around (mobility aids) if they are needed. These  include:  Canes.  Walkers.  Scooters.  Crutches.  Turn on the lights when you go into a dark area. Replace any light bulbs as soon as they burn out.  Set up your furniture so you have a clear path. Avoid moving your furniture around.  If any of your floors are uneven, fix them.  If there are any pets around you, be aware of where they are.  Review your medicines with your doctor. Some medicines can make you feel dizzy. This can increase your chance of falling. Ask your doctor what other things that you can do to help prevent falls. This information is not intended to replace advice given to you by your health care provider. Make sure you discuss any questions you have with your health care provider. Document Released: 09/29/2009 Document Revised: 05/10/2016 Document Reviewed: 01/07/2015 Elsevier Interactive Patient Education  2017 [employer].

## 2020-08-01 ENCOUNTER — Encounter (INDEPENDENT_AMBULATORY_CARE_PROVIDER_SITE_OTHER): Payer: Self-pay | Admitting: *Deleted

## 2020-08-17 ENCOUNTER — Other Ambulatory Visit: Payer: Self-pay

## 2020-08-17 ENCOUNTER — Telehealth: Payer: Self-pay

## 2020-08-17 DIAGNOSIS — J432 Centrilobular emphysema: Secondary | ICD-10-CM

## 2020-08-17 MED ORDER — IPRATROPIUM-ALBUTEROL 0.5-2.5 (3) MG/3ML IN SOLN: RESPIRATORY_TRACT | 6 refills | Status: DC

## 2020-08-17 NOTE — Telephone Encounter (Signed)
Med refilled.

## 2020-08-17 NOTE — Telephone Encounter (Signed)
ipratropium-albuterol (DUONEB) 0.5-2.5 (3) MG/3ML SOLN

## 2020-08-25 ENCOUNTER — Other Ambulatory Visit: Payer: Self-pay | Admitting: *Deleted

## 2020-08-25 ENCOUNTER — Other Ambulatory Visit: Payer: Self-pay

## 2020-08-25 NOTE — Patient Outreach (Addendum)
[facility]) Care Management  08/25/2020  [name] 02-05-1956 [id number]  Telephoneoutreach forcomplex care follow up Referral Date:09/04/19 Referral Source:MD referral -Dr [name] office Referral Reason:pt has medicare but can't afford medications does not have drug coverage- has emphysema -Resolved medicare coverage issue in October 2020 Insurance: NextGen Medicare/medicaid of [place] family planningeffective 11/7/2018SSI disability Humana medicare effective 10/06/19   [name] an outpatient [facility]in a [facility] facility on December2020 No ED visits or hospital admissions  Outreachsuccessfultothe home number [phone], [name] his mother answered and  is able to verify HIPAA [employer] Portability and Accountability Act) identifiers, date of birth (DOB) and address [name] has previously provided permission for [facility] RN CM to speak with Nicholas Decorey Wahlert in the past Reviewed with her the reason for the follow up - eye exam, inquiry about COPD, pulmonology appointment  Follow up Assessment Nicholas Lendell Caprice did confirm Nicholas Stonebraker did go to his scheduled eye exam and was informed he did not need glasses prn reading glasses from retail stores used per patient in the past   She confirms he continues to see [name] at Dr Lodema Hong offices Has a follow up on 08/30/20   Chronic obstructive pulmonary disease (COPD) He has not obtained an appointment with the local  Pulmonologist and has not been seen since followed by Dr Rulon Sera (retired)   [name] was provided with [facility] RN CM number to give to Nicholas Coppola for a possible return call   [facility] RN CM called to speak with [name] at [facility] in [place] [phone]  to confirm he was see June 06 2020   Social: Nicholas Drudge is at 64year old single retired male on disability who lives with his mother and sister, [name].His father has been deceased for 9 years.He  retired 3 years ago (/2017)per his mother as he began to have difficulty breathing. He has no children. He states he was married for 2 years.  He has a brother in the navy He has not been in the [employer]. He admits hecan read buthas difficulty readingand understanding what he reads. He states his mother or sister assists with reading. His mother confirms he can read but does not understand some things he reads. He is independent in his ADLs but needs assist with his iADLs. He does not drive. 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  He and his mother voices he has "anger problems" He likes football, mowing lawns and painting He drinks and smokes but is not ready to stop  Conditions:COPD per pt- Referral to [facility] reports(Centrilobular)emphysema, HTN, Coronary atherosclerosis, allergic rhinitis, nicotine dependence, currentsmoker (he reportsoff andon "cause things get on my nerve"), depression with generalizedanxietydisorder, lymphocytosis, neutropenia, vitamin D deficiency, alcohol addiction  ASN:[id number], borrows his mother's BP cuff,general store purchasedeyeglasses, oxygen for prn use (not using)   [facility] RN CM will attempt to follow up with Nicholas Dunkirk in the next 7-14 business days if no return call from him to assess for any further care coordination or disease management needs Pt encouraged to return a call to [facility] RN CM prn Routed note to MD Goals Addressed  This Visit's Progress     Patient Stated   .  [facility]) Patient will be able to manage his COPD and HTN at home (pt-stated)        CARE PLAN ENTRY (see longtitudinal plan of care for additional care plan  information)  Current Barriers:  [name] Knowledge deficits related to basic COPD self care/management . Literacy barriers . Cognitive Deficits   Case Manager Clinical Goal(s):  Over the next 60 days, patient will be able to verbalize understanding of COPD action plan and when to seek appropriate levels of medical care  Over the next 30 days, patient will engage in lite exercise as tolerated to build/regain stamina and strength and reduce shortness of breath through activity tolerance  Over the next 90 days, patient will verbalize basic understanding of COPD disease process and self care activitiesover the next 14 days patient will received EMMI materials related to his smoking & drinking as confirmed by verbalization during follow up call    Interventions:   Provided patient with basic written and verbal COPD education on self care/management/and exacerbation prevention   Provided patient with COPD action plan and reinforced importance of daily self assessment  Advised patient to engage in light exercise as tolerated 3-5 days a week  Patient Self Care Activities:  . Takes medications as prescribed including inhalers . Practices and uses pursed lip breathing for shortness of breath recovery and prevention . Self assesses COPD action plan zone and makes appointment with provider if in the yellow zone for 48 hours without improvement. . Engages in light exercise 3-5 days a week . Utilizes infection prevention strategies to reduce risk of respiratory infection  . Does not contact provider office for questions/concerns . Make an appointment with new pulmonology for follow up (since last provider retired)   Initial goal documentation Please see other previous [facility] care plan information listed in Epic under the flow sheet section          Jaela Yepez L. Noelle Penner, RN, BSN, CCM [facility] Telephonic Care Management Care Coordinator Office number [phone] Main [facility] number [phone] Fax number  [phone]

## 2020-08-29 ENCOUNTER — Other Ambulatory Visit: Payer: Self-pay | Admitting: *Deleted

## 2020-08-29 ENCOUNTER — Encounter: Payer: Self-pay | Admitting: *Deleted

## 2020-08-29 NOTE — Patient Outreach (Signed)
[facility]) Care Management  08/29/2020  TALOR CHEEMA [birth year] [id number]   Telephoneoutreach forcomplex care follow up Referral Date:09/04/19 Referral Source:MD referral -Dr Tula Nakayama office Referral Reason:pt has medicare but can't afford medications does not have drug coverage- has emphysema -Resolved medicare coverage issue in October 2020 Insurance: NextGen Medicare/medicaid of [place] family planningeffective 11/7/2018SSI disability Humana medicare effective 10/06/19   Mr detrich rakestraw an outpatient [facility]in a [facility] facility [address] No ED visits or hospital admissions  Mr Spinello returned a call at 1423 08/29/20 from [phone] [facility] RN CM returned a call to him at [phone] with no answer [place] home number [phone] also unsuccessful  Mr Halley returned a call to [facility] RN CM Patient is able to verify HIPAA ([place] and Accountability Act) identifiers, date of birth (DOB) and address Reviewed and addressed the purpose of the follow up call with the patient  Consent: [facility] RN CM reviewed [facility] services with patient. Patient gave verbal consent for services.  Follow up  Mr Enderle reports he is doing well except he reports he is presently working on  smoke cessation.  Mr Hynes reports his action plan that is helping him to stop smoking includes verbal support from his male cousin and he is using 2 rubber bands on his wrists to assist with diverting his attention when his thins about smoking. He is more motivated as he report having in last "five of his friends to pass away recently" He reports in the last 2 days he had 4 cigarettes total. He was commended for all his hard work [facility] RN CM mentioned smoke cessation products like gum, patch Mr Erber reports he does not want to use cessation patches related to a reported experience of "getting sick " using  patches  Chronic obstructive pulmonary disease (COPD)   He still has episodes of shortness of breath (sob) He reports he goes out early in the mornings before sunrise to attempt to work in his garden or outside so he has less SOB  [facility] RN CM discussed how to purse lip breath  Care coordination need He reports his nebulizer is needing a new mask and tubing He was able to read the following information from off his machine: compression nebulizer model# 18080 [facility] serial # [id number]  He recalls receiving his nebulizer years ago when he was hospitalized and prior to his discharge He and [facility] RN CM called to [facility] but had over a 20 minute wait [facility] RN CM was able to leave [facility] RN CM number for a return call  [facility] RN CM did receive a return call from [place] that does not indicate Mr Suto is listed in [place] [employer] with DME delivered or provided by [facility] RN CM noted a 05/20/17 & 05/22/17  Epic note for his [employer] Discharge that [employer], Forestine Na RN CM  indicates [facility] was providing a nebulizer. A note on 08/13/20 by Sherrie Mustache [facility] RN CM indicated pt had a nebulizer from [place] with [facility] DME representative. Pt was provided charity nebulizer in 2018 as he had no insurance coverage. Besty provided guidance to have primary care provider (PCP) provide an order to [facility] to assist with a new nebulizer and/or mask & tubing during his upcoming visit   He does report he is out of Dulera and Proventil and need prescription renewals Discuss  him having Percell Boston to complete this for him on 08/30/20  He confirms he does not recall being seen by a pulmonologist. He does not recall being seen by retired local pulmonologist, Dr Luan Pulling  Vision/Glasses He confirms his recent eye exam confirmed he only needed reading glasses and he had no other medical issues like cataracts, glaucoma, etc  [facility] on June 06 2020   He was commended for this    He voices he is aware of the need to have a colonoscopy completed   appointments  08/30/20 10 am visit with Percell Boston at PCP office  Pending colonoscopy  Plan [facility] RN CM will follow up with Mr Daino within the next 30-35 business days  Pt encouraged to return a call to [facility] RN CM prn Routed note to MD  Goals Addressed              This Visit's Progress     Patient Stated   .  [facility]) Patient will be able to manage his COPD and HTN at home (pt-stated)   On track     [place] (see longitudinal plan of care for additional care plan information)  Current Barriers:  [name] Knowledge deficits related to basic COPD self care/management . Literacy barriers . Cognitive Deficits   Case Manager Clinical Goal(s):  Over the next 60 days, patient will be able to verbalize understanding of COPD action plan and when to seek appropriate levels of medical care- 08/29/20 Progressing- taking inhalers, nebulizer, attempting to stop smoking, requesting assist to get new support DME for nebulizer  Over the next 30 days, patient will engage in lite exercise as tolerated to build/regain stamina and strength and reduce shortness of breath through activity tolerance 08/29/20 progressing continue to work in garden with rest breaks and early in mornings  Over the next 90 days, patient will verbalize basic understanding of COPD disease process and self care activities 08/29/20 Progressing- taking inhalers, nebulizer, attempting to stop smoking, requesting assist to get new support DME for nebulizer  over the next 14 days patient will received EMMI materials related to his smoking & drinking as confirmed by verbalization during follow up call - 08/29/20 received met    Interventions:   Provided patient with basic written and verbal COPD education on self care/management/and exacerbation prevention   Provided patient with COPD action plan and reinforced importance of daily self assessment  Provided written  and verbal instructions on pursed lip breathing and utilized returned demonstration as teach back  Provided patient with education about the role of exercise in the management of COPD  Advised patient to engage in light exercise as tolerated 3-5 days a week  Attempt conference call to [place] for assist with Nebulizer DME  Outreaches to DME provider of nebulizer for guidance for DME   Commend him for smoke cessation efforts  In basket message to PCP NP about identified needs during outreach  Patient Self Care Activities:  . Takes medications as prescribed including inhalers . Practices and uses pursed lip breathing for shortness of breath recovery and prevention . Self assesses COPD action plan zone and makes appointment with provider if in the yellow zone for 48 hours without improvement. . Engages in light exercise 3-5 days a week . Utilizes infection prevention strategies to reduce risk of respiratory infection  . Does not contact provider office for questions/concerns . Consider establishing care with pulmonology   Please see past updates related to this goal by clicking  on the "Past Updates" button in the selected goal  Please see other previous [facility] care plan information listed in Epic under the flow sheet section          Floetta Brickey L. Lavina Hamman, RN, BSN, [place] Coordinator Office number [phone] Main [facility] number [phone] Fax number [phone]

## 2020-08-30 ENCOUNTER — Ambulatory Visit (INDEPENDENT_AMBULATORY_CARE_PROVIDER_SITE_OTHER): Payer: Medicare Other | Admitting: Family Medicine

## 2020-08-30 ENCOUNTER — Other Ambulatory Visit: Payer: Self-pay

## 2020-08-30 ENCOUNTER — Encounter: Payer: Self-pay | Admitting: Family Medicine

## 2020-08-30 VITALS — BP 158/99 | HR 91 | Temp 97.1°F | Resp 18 | Ht 71.0 in | Wt 143.0 lb

## 2020-08-30 DIAGNOSIS — Z23 Encounter for immunization: Secondary | ICD-10-CM | POA: Diagnosis not present

## 2020-08-30 DIAGNOSIS — J432 Centrilobular emphysema: Secondary | ICD-10-CM | POA: Diagnosis not present

## 2020-08-30 DIAGNOSIS — I10 Essential (primary) hypertension: Secondary | ICD-10-CM

## 2020-08-30 MED ORDER — DULERA 200-5 MCG/ACT IN AERO: 2.0000 | INHALATION_SPRAY | Freq: Two times a day (BID) | RESPIRATORY_TRACT | 3 refills | Status: DC

## 2020-08-30 MED ORDER — IPRATROPIUM-ALBUTEROL 0.5-2.5 (3) MG/3ML IN SOLN: RESPIRATORY_TRACT | 6 refills | Status: DC

## 2020-08-30 MED ORDER — ALBUTEROL SULFATE HFA 108 (90 BASE) MCG/ACT IN AERS: 2.0000 | INHALATION_SPRAY | Freq: Four times a day (QID) | RESPIRATORY_TRACT | 2 refills | Status: DC | PRN

## 2020-08-30 MED ORDER — AMLODIPINE BESYLATE 5 MG PO TABS: 5.0000 mg | ORAL_TABLET | Freq: Every day | ORAL | 1 refills | Status: DC

## 2020-08-30 NOTE — Patient Instructions (Addendum)
I appreciate the opportunity to provide you with care for your health and wellness. Today we discussed: BP and Smoking  Follow up: 3 months w Dr Lodema Hong  No labs or referrals today  CONGRATULATIONS ON NOT SMOKING FOR ALMOST TWO DAYS!!!!!!  WE ARE SO HAPPY FOR YOU!!!!  Please keep up with good work. And please try to remember to take your BP medications :)  Please continue to practice social distancing to keep you, your family, and our community safe.  If you must go out, please wear a mask and practice good handwashing.  It was a pleasure to see you and I look forward to continuing to work together on your health and well-being. Please do not hesitate to call the office if you need care or have questions about your care.  Have a wonderful day and week. With Gratitude, Tereasa Coop, DNP, AGNP-BC

## 2020-08-30 NOTE — Assessment & Plan Note (Signed)
Continues to report not taking meds daily.  Is open and honest about this.  I have advised him to set the medications by his bathroom sink to help remember.  But again he reports that he just does not always remember.  He is gone 1/2 to 2 days without smoking.  And is drinking less.  He was applauded for this and congratulated on stopping and encouraged to continue this.  I do worry about him not taking his medication for blood pressure as directed could cause future problems.  He is educated on the impact of not taking the medication and verbalized understanding of this.

## 2020-08-30 NOTE — Assessment & Plan Note (Addendum)
PE positive for wheezing throughout lung fields has gone 1 1/2 to 2 days without smoking.  He is applauded for this.  When he does smoke he reports he has trouble with smoking and coughing.  Overall he is doing very well little irritable today in the office and he thinks this is because he got the smoking.  He reports that he is open to being referred to pulmonologist.  Refills of inhalers provided.  Advised for him to call when he is almost out of any medication so that he does not have to wait for refills.  Nebulizer refill provided as well as new nebulizer machine ordered.  Is followed by [facility] as well.

## 2020-08-30 NOTE — Progress Notes (Signed)
Subjective:  Patient ID: [name], male    DOB: 1956-06-01  Age: 64 y.o. MRN: [id number]  CC:  Chief Complaint  Patient presents with  . Follow-up    7-8 week bp follow up is high today       HPI  HPI  [name] is a 64 year old male patient of Dr. Anthony Sar.  He presents for follow-up today regarding blood pressure.  He continues to report not taking his blood pressure medication daily.  He is open and honest about this.  He also reports that he has not smoked in 1-1/2 to 2 days.  And that he has been using 2 rubber bands on his wrist to smack whenever he thinks about smoking.  He does report some irritability and frustrations regarding not smoking but overall he would like to continue to avoid it as every time he is smoked and the most recent past he has had excessive amounts of coughing and difficulty catching his breath.  He is open to referral for pulmonology.  Needs refills on his inhalers.  And needs a new nebulizer. Is followed by [facility] as well.  Today patient denies signs and symptoms of COVID 19 infection including fever, chills, cough, shortness of breath, and headache. Past Medical, Surgical, Social History, Allergies, and Medications have been Reviewed.   Past Medical History:  Diagnosis Date  . Alcohol abuse   . Allergic rhinitis   . Asthma   . COPD (chronic obstructive pulmonary disease) (HCC)   . Coronary atherosclerosis    Based on chest CT 2012  . Essential hypertension   . History of neutropenia    Also lymphocytosis - followed by Dr. Zigmund Daniel  . Nicotine addiction   . Olecranon bursitis, right elbow     Current Meds  Medication Sig  . albuterol (VENTOLIN HFA) 108 (90 Base) MCG/ACT inhaler Inhale 2 puffs into the lungs every 6 (six) hours as needed for wheezing or shortness of breath.  [name] amLODipine (NORVASC) 5 MG tablet Take 1 tablet (5 mg total) by mouth daily.  [name] aspirin EC 81 MG tablet Take 81 mg by mouth daily.  . busPIRone (BUSPAR) 5 MG  tablet Take 1 tablet (5 mg total) by mouth 3 (three) times daily.  [name] ipratropium-albuterol (DUONEB) 0.5-2.5 (3) MG/3ML SOLN Every 6 hours as needed.  . mometasone-formoterol (DULERA) 200-5 MCG/ACT AERO Inhale 2 puffs into the lungs 2 (two) times daily.  . rosuvastatin (CRESTOR) 5 MG tablet Take 1 tablet (5 mg total) by mouth daily.  [name] triamterene-hydrochlorothiazide (MAXZIDE-25) 37.5-25 MG tablet TAKE (1) TABLET BY MOUTH ONCE DAILY.  . [DISCONTINUED] albuterol (VENTOLIN HFA) 108 (90 Base) MCG/ACT inhaler Inhale 2 puffs into the lungs every 6 (six) hours as needed for wheezing or shortness of breath.  . [DISCONTINUED] amLODipine (NORVASC) 5 MG tablet Take 1 tablet (5 mg total) by mouth daily.  . [DISCONTINUED] ipratropium-albuterol (DUONEB) 0.5-2.5 (3) MG/3ML SOLN Every 6 hours as needed.  . [DISCONTINUED] mometasone-formoterol (DULERA) 200-5 MCG/ACT AERO Inhale 2 puffs into the lungs 2 (two) times daily.    ROS:  Review of Systems  Constitutional: Negative.   HENT: Negative.   Eyes: Negative.   Respiratory: Negative.   Cardiovascular: Negative.   Gastrointestinal: Negative.   Genitourinary: Negative.   Musculoskeletal: Negative.   Skin: Negative.   Neurological: Negative.   Endo/Heme/Allergies: Negative.   Psychiatric/Behavioral: Negative.      Objective:   Today's Vitals: BP (!) 158/99 (BP Location: Right Arm, Patient  Position: Sitting, Cuff Size: Normal)   Pulse 91   Temp (!) 97.1 F (36.2 C) (Temporal)   Resp 18   Ht 5\' 11"  (1.803 m)   Wt 143 lb (64.9 kg)   SpO2 98%   BMI 19.94 kg/m  Vitals with BMI 08/30/2020 07/29/2020 07/12/2020  Height 5\' 11"  5\' 11"  5\' 11"   Weight 143 lbs 139 lbs 138 lbs 13 oz  BMI 19.95 19.4 19.37  Systolic 158 152 07/14/2020  Diastolic 99 88 88  Pulse 91 - 75     Physical Exam Vitals and nursing note reviewed.  Constitutional:      Appearance: Normal appearance. He is well-developed, well-groomed and normal weight.  HENT:     Head: Normocephalic  and atraumatic.     Right Ear: External ear normal.     Left Ear: External ear normal.     Mouth/Throat:     Comments: Mask in place  Eyes:     General:        Right eye: No discharge.        Left eye: No discharge.     Conjunctiva/sclera: Conjunctivae normal.  Cardiovascular:     Rate and Rhythm: Normal rate and regular rhythm.     Pulses: Normal pulses.     Heart sounds: Normal heart sounds.  Pulmonary:     Effort: Pulmonary effort is normal.     Breath sounds: No stridor. Wheezing present.  Musculoskeletal:        General: Normal range of motion.     Cervical back: Normal range of motion and neck supple.  Skin:    General: Skin is warm.  Neurological:     General: No focal deficit present.     Mental Status: He is alert and oriented to person, place, and time.  Psychiatric:        Attention and Perception: Attention normal.        Mood and Affect: Mood normal.        Speech: Speech normal.        Behavior: Behavior normal. Behavior is cooperative.        Thought Content: Thought content normal.        Cognition and Memory: Cognition normal.        Judgment: Judgment normal.     Assessment   1. Centrilobular emphysema (HCC)   2. Essential hypertension   3. Need for immunization against influenza     Tests ordered Orders Placed This Encounter  Procedures  . For home use only DME Nebulizer machine  . Flu Vaccine QUAD 36+ mos IM  . Ambulatory referral to Pulmonology     Plan: Please see assessment and plan per problem list above.   Meds ordered this encounter  Medications  . mometasone-formoterol (DULERA) 200-5 MCG/ACT AERO    Sig: Inhale 2 puffs into the lungs 2 (two) times daily.    Dispense:  13 g    Refill:  3    Order Specific Question:   Supervising Provider    Answer:   SIMPSON, MARGARET E [[id number]]  . albuterol (VENTOLIN HFA) 108 (90 Base) MCG/ACT inhaler    Sig: Inhale 2 puffs into the lungs every 6 (six) hours as needed for wheezing or shortness  of breath.    Dispense:  16 g    Refill:  2    Proventil is being dispensed from health dept    Order Specific Question:   Supervising Provider    Answer:  SIMPSON, MARGARET E [[id number]]  . ipratropium-albuterol (DUONEB) 0.5-2.5 (3) MG/3ML SOLN    Sig: Every 6 hours as needed.    Dispense:  90 mL    Refill:  6    Order Specific Question:   Supervising Provider    Answer:   SIMPSON, MARGARET E [[id number]]  . amLODipine (NORVASC) 5 MG tablet    Sig: Take 1 tablet (5 mg total) by mouth daily.    Dispense:  60 tablet    Refill:  1    Order Specific Question:   Supervising Provider    Answer:   Kerri Perches [[id number]]    Patient to follow-up in 3 months.  Freddy Finner, NP

## 2020-08-30 NOTE — Assessment & Plan Note (Signed)

## 2020-09-01 ENCOUNTER — Ambulatory Visit: Payer: Medicare Other | Admitting: *Deleted

## 2020-09-29 ENCOUNTER — Other Ambulatory Visit: Payer: Self-pay

## 2020-09-29 ENCOUNTER — Ambulatory Visit (INDEPENDENT_AMBULATORY_CARE_PROVIDER_SITE_OTHER): Payer: Medicare Other | Admitting: Gastroenterology

## 2020-09-29 ENCOUNTER — Encounter (INDEPENDENT_AMBULATORY_CARE_PROVIDER_SITE_OTHER): Payer: Self-pay | Admitting: Gastroenterology

## 2020-09-29 VITALS — BP 168/140 | HR 91 | Temp 98.9°F | Ht 71.0 in | Wt 136.1 lb

## 2020-09-29 DIAGNOSIS — Z7189 Other specified counseling: Secondary | ICD-10-CM

## 2020-09-29 DIAGNOSIS — A084 Viral intestinal infection, unspecified: Secondary | ICD-10-CM | POA: Diagnosis not present

## 2020-09-29 DIAGNOSIS — Z1211 Encounter for screening for malignant neoplasm of colon: Secondary | ICD-10-CM

## 2020-09-29 NOTE — Progress Notes (Signed)
[name], M.D. Gastroenterology & Hepatology [facility] [address] [place], [place] [address] Primary Care Physician: Kerri Perches, MD [address] [place] [address]  Referring MD: PCP  I will communicate my assessment and recommendations to the referring MD via EMR. Note: Occasional unusual wording and randomly placed punctuation marks may result from the use of speech recognition technology to transcribe this document"  Chief Complaint:  Episodes of diarrhea and screening colonoscopy  History of Present Illness: ROLONDO [name] is a 64 y.o. male with PMH COPD, CAD, HTN, who presents for evaluation of episodes of diarrhea and screening colonoscopy.  Patient states that after his COVID shots 2 months ago he noticed he had a day of up to 6 watery BMs. This resolved on its own but he was concerned about it.  Then, the patient reports that that he also got his flu shot last month, he had a repeat episode of diarrhea, had 5 watery BMs, but this also resolved on its own.  Finally, the patient reports today in the morning he had a normal BM but after that he has had 3 episodes of watery BMs without blood. He ate some beans and stewed tomatoes yesterday, he said the food was sitting outside for a while which he believes led to his symptoms. Usually he has 3 solid BMs per day.  He started taking Metamucil today in the morning.  The patient denies having any nausea, vomiting, fever, chills, hematochezia, melena, hematemesis, abdominal distention, abdominal pain, jaundice, pruritus or weight loss.  Last ZDG:LOVFI Last Colonoscopy: 2010 - normal   FHx: neg for any gastrointestinal/liver disease, mother breast cancer, uncles lung cancer x2 Social: smokes 1/2 pack a day, drinks 12 pack beer every week, no illicit drug use Surgical: non contributory  Past Medical History: Past Medical History:  Diagnosis Date  .  Alcohol abuse   . Allergic rhinitis   . Asthma   . COPD (chronic obstructive pulmonary disease) (HCC)   . Coronary atherosclerosis    Based on chest CT 2012  . Essential hypertension   . History of neutropenia    Also lymphocytosis - followed by Dr. Zigmund Navdeep Fessenden  . Nicotine addiction   . Olecranon bursitis, right elbow     Past Surgical History: Past Surgical History:  Procedure Laterality Date  . MULTIPLE TOOTH EXTRACTIONS  June 2012  . OLECRANON BURSECTOMY Right 11/26/2019   Procedure: OLECRANON BURSECTOMY;  Surgeon: Vickki Hearing, MD;  Location: [facility];  Service: Orthopedics;  Laterality: Right;  pt knows to arrive at 6:15    Family History: Family History  Problem Relation Age of Onset  . Diabetes Mother   . Hypertension Mother   . Diabetes Father   . Hypertension Sister     Social History: Social History   Tobacco Use  Smoking Status Current Every Day Smoker  . Packs/day: 0.50  . Years: 38.00  . Pack years: 19.00  . Types: Cigarettes  Smokeless Tobacco Never Used   Social History   Substance and Sexual Activity  Alcohol Use Yes   Comment: occasionally a beer   Social History   Substance and Sexual Activity  Drug Use No    Allergies: Allergies  Allergen Reactions  . Lisinopril Swelling and Other (See Comments)    Reaction:  Angioedema    Medications: Current Outpatient Medications  Medication Sig Dispense Refill  . albuterol (VENTOLIN HFA) 108 (90 Base) MCG/ACT inhaler Inhale  2 puffs into the lungs every 6 (six) hours as needed for wheezing or shortness of breath. 16 g 2  . amLODipine (NORVASC) 5 MG tablet Take 1 tablet (5 mg total) by mouth daily. 60 tablet 1  . aspirin EC 81 MG tablet Take 81 mg by mouth daily.    . busPIRone (BUSPAR) 5 MG tablet Take 1 tablet (5 mg total) by mouth 3 (three) times daily. 270 tablet 3  . ipratropium-albuterol (DUONEB) 0.5-2.5 (3) MG/3ML SOLN Every 6 hours as needed. 90 mL 6  . mometasone-formoterol (DULERA)  200-5 MCG/ACT AERO Inhale 2 puffs into the lungs 2 (two) times daily. 13 g 3  . triamterene-hydrochlorothiazide (MAXZIDE-25) 37.5-25 MG tablet TAKE (1) TABLET BY MOUTH ONCE DAILY. 90 tablet 3  . rosuvastatin (CRESTOR) 5 MG tablet Take 1 tablet (5 mg total) by mouth daily. 90 tablet 3   No current facility-administered medications for this visit.    Review of Systems: GENERAL: negative for malaise, night sweats HEENT: No changes in hearing or vision, no nose bleeds or other nasal problems. NECK: Negative for lumps, goiter, pain and significant neck swelling RESPIRATORY: Negative for cough, wheezing CARDIOVASCULAR: Negative for chest pain, leg swelling, palpitations, orthopnea GI: SEE HPI MUSCULOSKELETAL: Negative for joint pain or swelling, back pain, and muscle pain. SKIN: Negative for lesions, rash PSYCH: Negative for sleep disturbance, mood disorder and recent psychosocial stressors. HEMATOLOGY Negative for prolonged bleeding, bruising easily, and swollen nodes. ENDOCRINE: Negative for cold or heat intolerance, polyuria, polydipsia and goiter. NEURO: negative for tremor, gait imbalance, syncope and seizures. The remainder of the review of systems is noncontributory.   Physical Exam: BP (!) 168/140 (BP Location: Left Arm, Patient Position: Sitting, Cuff Size: Normal) Comment: Manually, and automatic was 163/132.  Pulse 91   Temp 98.9 F (37.2 C) (Oral)   Ht 5\' 11"  (1.803 m)   Wt 136 lb 1.6 oz (61.7 kg)   BMI 18.98 kg/m  GENERAL: The patient is AO x3, in no acute distress. HEENT: Head is normocephalic and atraumatic. EOMI are intact. Mouth is well hydrated and without lesions. NECK: Supple. No masses LUNGS: Clear to auscultation. No presence of rhonchi/wheezing/rales. Adequate chest expansion HEART: RRR, normal s1 and s2. ABDOMEN: Soft, nontender, no guarding, no peritoneal signs, and nondistended. BS +. No masses. EXTREMITIES: Without any cyanosis, clubbing, rash, lesions or  edema. NEUROLOGIC: AOx3, no focal motor deficit. SKIN: no jaundice, no rashes   Imaging/Labs: as above  I personally reviewed and interpreted the available labs, imaging and endoscopic files.  Impression and Plan: [name] is a 64 y.o. male with PMH COPD, CAD, HTN, who presents for evaluation of episodes of diarrhea and screening colonoscopy.  The patient presented intermittent isolated episodes of watery diarrhea that resolved on its own.  It is unclear if this was triggered by intake of spoiled food and reaction to the vaccination, or if it was an episode of viral gastroenteritis.  He has not presented any red flag signs, for which I consider no further investigation is warranted at this moment.  I asked the patient to take Metamucil every day to increase the bulk of his stool and avoid further episodes.  He understood and agreed.  However, he is due for colorectal cancer screening, for which I will schedule a colonoscopy.  The patient understood and agreed.  - Schedule colonoscopy - Start taking Metamucil daily  All questions were answered.      79, MD Gastroenterology and Hepatology [facility]  

## 2020-09-29 NOTE — Patient Instructions (Signed)
Schedule colonoscopy

## 2020-10-03 ENCOUNTER — Other Ambulatory Visit (INDEPENDENT_AMBULATORY_CARE_PROVIDER_SITE_OTHER): Payer: Self-pay | Admitting: *Deleted

## 2020-10-03 ENCOUNTER — Encounter (INDEPENDENT_AMBULATORY_CARE_PROVIDER_SITE_OTHER): Payer: Self-pay | Admitting: *Deleted

## 2020-10-03 ENCOUNTER — Telehealth (INDEPENDENT_AMBULATORY_CARE_PROVIDER_SITE_OTHER): Payer: Self-pay | Admitting: *Deleted

## 2020-10-03 MED ORDER — PEG 3350-KCL-NA BICARB-NACL 420 G PO SOLR: 4000.0000 mL | Freq: Once | ORAL | 0 refills | Status: AC

## 2020-10-03 NOTE — Telephone Encounter (Signed)
Patient needs trilyte 

## 2020-10-10 ENCOUNTER — Other Ambulatory Visit: Payer: Self-pay

## 2020-10-10 ENCOUNTER — Other Ambulatory Visit: Payer: Self-pay | Admitting: *Deleted

## 2020-10-10 NOTE — Patient Outreach (Signed)
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, RN, BSN, CCM [facility] Telephonic Care Management Care Coordinator Office number [phone] Main [facility] number [phone] Fax number [phone]

## 2020-10-10 NOTE — Patient Outreach (Signed)
[facility]) Care Management  10/10/2020  [name] Jul 06, 1956 [id number]   Telephoneoutreach forcomplex carefollow upand warm transfer to [facility] RN CM  Referral Date:09/04/19 Referral Source:MD referral -Dr [name] office Referral Reason:pt has medicare but can't afford medications does not have drug coverage- has emphysema -Resolved medicare coverage issue in October 2020 Insurance: [employer] Medicare/[place]icaid [place] family planningeffective 11/7/2018SSI disability [employer] medicare effective 10/06/19   [name] an outpatient surgeryin a [facility] facility on December2020 No ED visits or hospital admissions   [name] is able to verify HIPAA [facility] Portability and Accountability Act) identifiers, date of birth (DOB) and address Reviewed and addressed the purpose of the follow up call with the patient- Follow up with warm transfer to [facility] RN CM  Consent: [facility]) RN CM reviewed [name] services with patient. Patient gave verbal consent for services.  Follow up  [name] reports he is doing well  He confirms he is still with [facility] and has not changed his coverage for the upcoming 2022 year Smoke cessation -He reports he continues to work on decreasing his smoking It is noted he informed his pcp NP he was down to 2 cigarettes a day. On today he confirms he is at 5-6 cigarettes a day Smoking counseling provided Encouragement provided   Confirms upcoming colonoscopy related to having stomach ache, dizziness and diarrhea after his flu shot \  [facility] RN CM discussed and reviewed with pt his upcoming appointment with new pulmonologist on 11/2/221  He and his mother voiced understanding  [facility] RN CM discussed transfer to [facility] staff with [name] but after review of his Epic coverage he is noted with Medicare, Medicaid family planning and [employer] for medications. [employer] is not  listed as primary   [facility] RN CM will follow up with [name] within the next 30 business days    Rollen Selders L. Noelle Penner, RN, BSN, CCM [facility] Telephonic Care Management Care Coordinator Office number [phone] Main [facility] number [phone] Fax number [phone]

## 2020-10-18 ENCOUNTER — Ambulatory Visit (INDEPENDENT_AMBULATORY_CARE_PROVIDER_SITE_OTHER): Payer: Medicare Other | Admitting: Internal Medicine

## 2020-10-18 ENCOUNTER — Other Ambulatory Visit: Payer: Self-pay

## 2020-10-18 ENCOUNTER — Encounter: Payer: Self-pay | Admitting: Internal Medicine

## 2020-10-18 DIAGNOSIS — I2510 Atherosclerotic heart disease of native coronary artery without angina pectoris: Secondary | ICD-10-CM | POA: Diagnosis not present

## 2020-10-18 DIAGNOSIS — J449 Chronic obstructive pulmonary disease, unspecified: Secondary | ICD-10-CM | POA: Insufficient documentation

## 2020-10-18 DIAGNOSIS — R911 Solitary pulmonary nodule: Secondary | ICD-10-CM | POA: Diagnosis not present

## 2020-10-18 DIAGNOSIS — F17210 Nicotine dependence, cigarettes, uncomplicated: Secondary | ICD-10-CM | POA: Diagnosis not present

## 2020-10-18 NOTE — Patient Instructions (Addendum)
Plan A = Automatic = Always=    Dulera 200  Take 2 puffs first thing in am and then another 2 puffs about 12 hours later.    Work on inhaler technique:  relax and gently blow all the way out then take a nice smooth deep breath back in, triggering the inhaler at same time you start breathing in.  Hold for up to 5 seconds if you can. Blow out thru nose. Rinse and gargle with water when done    Plan B = Backup (to supplement plan A, not to replace it) Only use your albuterol inhaler as a rescue medication to be used if you can't catch your breath by resting or doing a relaxed purse lip breathing pattern.  - The less you use it, the better it will work when you need it. - Ok to use the inhaler up to 2 puffs  every 4 hours if you must but call for appointment if use goes up over your usual need - Don't leave home without it !!  (think of it like the spare tire for your car)   Plan C = Crisis (instead of Plan B but only if Plan B stops working) - only use your albuterol nebulizer if you first try Plan B and it fails to help > ok to use the nebulizer up to every 4 hours but if start needing it regularly call for immediate appointment  The key is to stop smoking completely before smoking completely stops you!  Please remember to go to the  x-ray department  @  [facility] for your tests - we will call you with the results when they are available     Please schedule a follow up office visit in 6 weeks  with pfts on return

## 2020-10-18 NOTE — Progress Notes (Addendum)
[name], male    DOB: 1956-05-06, 64 y.o.   MRN: [id number]   Brief patient profile:  47 yobm active smoker  with breathing problems as child better primatene and out grew by teens   and able to play sports in school then worse again age 87 while working with [employer] and better since quit referred to pulmonary clinic in [place]  10/18/2020 by Dr   Lodema Hong      History of Present Illness  10/18/2020  Pulmonary/ 1st office eval/ Sherene Sires / [facility] 200 one bid  Chief Complaint  Patient presents with  . Consult    intermittent non productive cough  Dyspnea: yard work  Cough: variably present, not noct/ min vol / mucoid  Sleep: not much problem lately SABA use: way too much p ex / uses neb bid  No obvious day to day or daytime variability or assoc excess/ purulent sputum or mucus plugs or hemoptysis or cp or chest tightness, subjective wheeze or overt sinus or hb symptoms.   Sleeping  without nocturnal  or early am exacerbation  of respiratory  c/o's or need for noct saba. Also denies any obvious fluctuation of symptoms with weather or environmental changes or other aggravating or alleviating factors except as outlined above   No unusual exposure hx or h/o childhood pna  or knowledge of premature birth.  Current Allergies, Complete Past Medical History, Past Surgical History, Family History, and Social History were reviewed in [employer] record.  ROS  The following are not active complaints unless bolded Hoarseness, sore throat, dysphagia, dental problems, itching, sneezing,  nasal congestion or discharge of excess mucus or purulent secretions, ear ache,   fever, chills, sweats, unintended wt loss or wt gain, classically pleuritic or exertional cp,  orthopnea pnd or arm/hand swelling  or leg swelling, presyncope, palpitations, abdominal pain, anorexia, nausea, vomiting, diarrhea  or change in bowel habits or change in bladder habits, change in  stools or change in urine, dysuria, hematuria,  rash, arthralgias, visual complaints, headache, numbness, weakness or ataxia or problems with walking or coordination,  change in mood or  memory.              Past Medical History:  Diagnosis Date  . Alcohol abuse   . Allergic rhinitis   . Asthma   . COPD (chronic obstructive pulmonary disease) (HCC)   . Coronary atherosclerosis    Based on chest CT 2012  . Essential hypertension   . History of neutropenia    Also lymphocytosis - followed by Dr. Zigmund Daniel  . Nicotine addiction   . Olecranon bursitis, right elbow     Outpatient Medications Prior to Visit  Medication Sig Dispense Refill  . albuterol (VENTOLIN HFA) 108 (90 Base) MCG/ACT inhaler Inhale 2 puffs into the lungs every 6 (six) hours as needed for wheezing or shortness of breath. 16 g 2  . amLODipine (NORVASC) 5 MG tablet Take 1 tablet (5 mg total) by mouth daily. 60 tablet 1  . aspirin EC 81 MG tablet Take 81 mg by mouth daily.    . busPIRone (BUSPAR) 5 MG tablet Take 1 tablet (5 mg total) by mouth 3 (three) times daily. 270 tablet 3  . ipratropium-albuterol (DUONEB) 0.5-2.5 (3) MG/3ML SOLN Every 6 hours as needed. 90 mL 6  . mometasone-formoterol (DULERA) 200-5 MCG/ACT AERO Inhale 2 puffs into the lungs 2 (two) times daily. 13 g 3  . triamterene-hydrochlorothiazide (MAXZIDE-25) 37.5-25 MG tablet TAKE (  1) TABLET BY MOUTH ONCE DAILY. 90 tablet 3  . GAVILYTE-G 236 g solution Take 4,000 mLs by mouth once. (Patient not taking: Reported on 10/18/2020)    . rosuvastatin (CRESTOR) 5 MG tablet Take 1 tablet (5 mg total) by mouth daily. 90 tablet 3   No facility-administered medications prior to visit.     Objective:     BP (!) 140/98 (BP Location: Left Arm, Cuff Size: Normal)   Pulse 84   Temp (!) 97.4 F (36.3 C) (Other (Comment)) Comment (Src): wrist  Ht 5\' 11"  (1.803 m)   Wt 139 lb 6.4 oz (63.2 kg)   SpO2 100% Comment: Room air  BMI 19.44 kg/m   SpO2: 100 % (Room  air)    amb tremulous bm nad  HEENT : pt wearing mask not removed for exam due to covid -19 concerns.    NECK :  without JVD/Nodes/TM/ nl carotid upstrokes bilaterally   LUNGS: no acc muscle use,  Mod barrel  contour chest wall with bilateral  Distant bs s audible wheeze and  without cough on insp or exp maneuvers and mod  Hyperresonant  to  percussion bilaterally     CV:  RRR  no s3 or murmur or increase in P2, and no edema   ABD:  soft and nontender with pos mid insp Hoover's  in the supine position. No bruits or organomegaly appreciated, bowel sounds nl  MS:     ext warm without deformities, calf tenderness, cyanosis or clubbing No obvious joint restrictions   SKIN: warm and dry without lesions    NEURO:  alert, approp, nl sensorium with  no motor or cerebellar deficits apparent.        CXR PA and Lateral:   10/19/20 (late entry) I personally reviewed images and agree with radiology impression as follows:             Assessment   COPD GOLD 3 likely and still smoking  Active smoker  PFT's  10/18/2020  FEV1 2.12 (51 % ) ratio 0.64  p 0 % improvement from saba p ? prior to study with DLCO  19.03 (45%) corrects to 4.18 (83%)  for alv volume and FV curve mild concavity   -  10/18/2020  After extensive coaching inhaler device,  effectiveness =    90%    Group D in terms of symptom/risk and laba/lama/ICS  therefore appropriate rx at this point >>>  breztri candidate but first needs to finish his present dulera 200 dosed correctly and return for pfts / cxr to complete the w/u and learn to use his maint vs prns more appropriately  Re saba I spent extra time with pt today reviewing appropriate use of albuterol for prn use on exertion with the following points: 1) saba is for relief of sob that does not improve by walking a slower pace or resting but rather if the pt does not improve after trying this first. 2) If the pt is convinced, as many are, that saba helps recover from  activity faster then it's easy to tell if this is the case by re-challenging : ie stop, take the inhaler, then p 5 minutes try the exact same activity (intensity of workload) that just caused the symptoms and see if they are substantially diminished or not after saba 3) if there is an activity that reproducibly causes the symptoms, try the saba 15 min before the activity on alternate days   If in fact the saba really  does help, then fine to continue to use it prn but advised may need to look closer at the maintenance regimen being used to achieve better control of airways disease with exertion.      Solitary pulmonary nodule See cxr 10/19/2020  - last Low dose CT chest 2016 ok  - repeat CT rec esp in active smoker   Discussed in detail all the  indications, usual  risks and alternatives  relative to the benefits with patient who agrees to proceed with w/u as outlined.          Cigarette smoker Counseled re importance of smoking cessation but did not meet time criteria for separate billing     Each maintenance medication was reviewed in detail including emphasizing most importantly the difference between maintenance and prns and under what circumstances the prns are to be triggered using an action plan format where appropriate.  Total time for H and P, chart review, counseling, teaching device and generating customized AVS unique to this office visit / charting =       Sandrea Hughs, MD 10/18/2020

## 2020-10-18 NOTE — Assessment & Plan Note (Addendum)
Active smoker  PFT's  10/18/2020  FEV1 2.12 (51 % ) ratio 0.64  p 0 % improvement from saba p ? prior to study with DLCO  19.03 (45%) corrects to 4.18 (83%)  for alv volume and FV curve mild concavity   -  10/18/2020  After extensive coaching inhaler device,  effectiveness =    90%    Group D in terms of symptom/risk and laba/lama/ICS  therefore appropriate rx at this point >>>  breztri candidate but first needs to finish his present dulera 200 dosed correctly and return for pfts / cxr to complete the w/u and learn to use his maint vs prns more appropriately  Re saba I spent extra time with pt today reviewing appropriate use of albuterol for prn use on exertion with the following points: 1) saba is for relief of sob that does not improve by walking a slower pace or resting but rather if the pt does not improve after trying this first. 2) If the pt is convinced, as many are, that saba helps recover from activity faster then it's easy to tell if this is the case by re-challenging : ie stop, take the inhaler, then p 5 minutes try the exact same activity (intensity of workload) that just caused the symptoms and see if they are substantially diminished or not after saba 3) if there is an activity that reproducibly causes the symptoms, try the saba 15 min before the activity on alternate days   If in fact the saba really does help, then fine to continue to use it prn but advised may need to look closer at the maintenance regimen being used to achieve better control of airways disease with exertion.

## 2020-10-18 NOTE — Assessment & Plan Note (Signed)

## 2020-10-19 ENCOUNTER — Ambulatory Visit (HOSPITAL_COMMUNITY)
Admission: RE | Admit: 2020-10-19 | Discharge: 2020-10-19 | Disposition: A | Discharge status: Home / Self Care | Payer: Medicare Other | Source: Ambulatory Visit | Attending: Internal Medicine | Admitting: Internal Medicine

## 2020-10-19 ENCOUNTER — Telehealth: Payer: Self-pay | Admitting: Internal Medicine

## 2020-10-19 ENCOUNTER — Other Ambulatory Visit: Payer: Self-pay

## 2020-10-19 DIAGNOSIS — J449 Chronic obstructive pulmonary disease, unspecified: Secondary | ICD-10-CM | POA: Insufficient documentation

## 2020-10-19 NOTE — Telephone Encounter (Signed)
Called and spoke with [name]:  Chest X-ray result  IMPRESSION: Interval development of a nodular-like 6 mm density overlying the right upper lobe. Recommend CT chest for further evaluation.  Dr. Sherene Sires, please advise.  Thank you.

## 2020-10-20 DIAGNOSIS — R911 Solitary pulmonary nodule: Secondary | ICD-10-CM | POA: Insufficient documentation

## 2020-10-20 NOTE — Assessment & Plan Note (Addendum)
See cxr 10/19/2020  - last Low dose CT chest 2016 ok  - repeat CT rec esp in active smoker   Discussed in detail all the  indications, usual  risks and alternatives  relative to the benefits with patient who agrees to proceed with w/u as outlined.

## 2020-10-20 NOTE — Telephone Encounter (Signed)
See result note.  

## 2020-10-24 ENCOUNTER — Other Ambulatory Visit: Payer: Self-pay | Admitting: Internal Medicine

## 2020-10-24 DIAGNOSIS — R911 Solitary pulmonary nodule: Secondary | ICD-10-CM

## 2020-10-24 NOTE — Progress Notes (Signed)
I spoke with the pt and he has multiple questions about the results and wants Dr Sherene Sires to please call him. He was okay with the CT so I have already placed the order.

## 2020-11-07 ENCOUNTER — Other Ambulatory Visit: Payer: Self-pay | Admitting: *Deleted

## 2020-11-07 ENCOUNTER — Encounter: Payer: Self-pay | Admitting: *Deleted

## 2020-11-07 ENCOUNTER — Other Ambulatory Visit: Payer: Self-pay

## 2020-11-07 NOTE — Patient Outreach (Signed)
[facility]) Care Management  11/07/2020  [name] 1956-05-08 [id number]   Telephoneoutreach forcomplex carefollow up Referral Date:09/04/19 Referral Source:MD referral -Dr [name] office Referral Reason:pt has medicare but can't afford medications does not have drug coverage- has emphysema -Resolved medicare coverage issue in October 2020 Insurance: [employer] Medicare/medicaid of  family planningeffective 11/7/2018SSI disability [employer] medicare effective 10/06/19   [name] [facility] facility on December[facility] No ED visits or hospital admissions   [name] is able to verify HIPAA [facility] Portability and Accountability Act) identifiers, date of birth (DOB) and address Reviewed and addressed the purpose of the follow up call with the patient- Follow up with warm transfer to [facility] RN CM  Consent: [facility]) RN CM reviewed [facility] services with patient. Patient gave verbal consent for services.  Follow up  [name] reports he is doing well   He confirms he is still with [employer] medicare only for his medicines  He has traditional medicare and medicaid family planning and has not changed his coverage for the upcoming 2022 year  COPD He denies difficulty breathing. He continues to use his nebulizer and inhaler as ordered. He is practicing how to use his inhaler as instructed in his last pulmonary visit with Dr Sherene Sires He is to have a lung CT on 11/14/20 to follow up an "spot" found on his lungs He states he spoke with the Dr Sherene Sires and his nurse about this in detail  Smoke cessation - He reports he continues to work on decreasing his smoking He is at 5-6 cigarettes a day Smoking counseling provided Encouragement provided  He discussed he is now willing to try using a smoke cessation patch. He reports previously not wanting to use a patch as he heard it makes people sick"   He and [facility] RN CM discussed the patch, using his rubber band, staying away from triggers, Chantix, increasing fluids, etc   Quality of life He reports remaining active, support of his family, looking forward to the up coming holiday to spend with family, cleaning/decluttering his living area, mowing, collecting his wind chimes and creating a place of relaxation off his patio    Plans [facility] RN CM will follow up with [name] within the next 30 business days    Nicholas Hashemi L. Noelle Penner, RN, BSN, CCM [facility] Telephonic Care Management Care Coordinator Office number [phone] Main [facility] number [phone] Fax number [phone]

## 2020-11-14 ENCOUNTER — Other Ambulatory Visit (HOSPITAL_COMMUNITY)
Admission: RE | Admit: 2020-11-14 | Discharge: 2020-11-14 | Disposition: A | Discharge status: Home / Self Care | Payer: Medicare Other | Source: Ambulatory Visit | Attending: Gastroenterology | Admitting: Gastroenterology

## 2020-11-14 ENCOUNTER — Encounter (HOSPITAL_COMMUNITY): Payer: Self-pay | Admitting: Gastroenterology

## 2020-11-14 ENCOUNTER — Other Ambulatory Visit: Payer: Self-pay

## 2020-11-14 ENCOUNTER — Ambulatory Visit
Admission: RE | Admit: 2020-11-14 | Discharge: 2020-11-14 | Disposition: A | Discharge status: Home / Self Care | Payer: Medicare Other | Source: Ambulatory Visit | Attending: Internal Medicine | Admitting: Internal Medicine

## 2020-11-14 DIAGNOSIS — Z20822 Contact with and (suspected) exposure to covid-19: Secondary | ICD-10-CM | POA: Diagnosis not present

## 2020-11-14 DIAGNOSIS — R911 Solitary pulmonary nodule: Secondary | ICD-10-CM

## 2020-11-14 DIAGNOSIS — Z01812 Encounter for preprocedural laboratory examination: Secondary | ICD-10-CM | POA: Insufficient documentation

## 2020-11-14 LAB — BASIC METABOLIC PANEL
Anion gap: 16 — ABNORMAL HIGH (ref 5–15)
BUN: 15 mg/dL (ref 8–23)
CO2: 25 mmol/L (ref 22–32)
Calcium: 9.3 mg/dL (ref 8.9–10.3)
Chloride: 93 mmol/L — ABNORMAL LOW (ref 98–111)
Creatinine, Ser: 1.2 mg/dL (ref 0.61–1.24)
GFR, Estimated: >60 mL/min (ref >60)
Glucose, Bld: 89 mg/dL (ref 70–99)
Potassium: 4.7 mmol/L (ref 3.5–5.1)
Sodium: 134 mmol/L — ABNORMAL LOW (ref 135–145)

## 2020-11-15 LAB — SARS CORONAVIRUS 2 (TAT 6-24 HRS): SARS Coronavirus 2: NEGATIVE

## 2020-11-15 NOTE — Progress Notes (Signed)
Spoke with the pt's sister ok per DPR and notified of results. She verbalized understanding and will inform the pt.

## 2020-11-16 ENCOUNTER — Ambulatory Visit (HOSPITAL_COMMUNITY): Admission: RE | Admit: 2020-11-16 | Payer: Medicare Other | Source: Home / Self Care | Admitting: Gastroenterology

## 2020-11-16 SURGERY — COLONOSCOPY WITH PROPOFOL
Anesthesia: Monitor Anesthesia Care

## 2020-11-21 ENCOUNTER — Other Ambulatory Visit: Payer: Self-pay

## 2020-11-21 ENCOUNTER — Other Ambulatory Visit: Payer: Self-pay | Admitting: *Deleted

## 2020-11-21 NOTE — Patient Outreach (Signed)
[facility]) Care Management  11/21/2020  [name] 04-24-1956 [id number]   Telephoneoutreach forcomplex carefollow up Referral Date:09/04/19 Referral Source:MD referral -Dr [name] office Referral Reason:pt has medicare but can't afford medications does not have drug coverage- has emphysema -Resolved medicare coverage issue in October 2020 Insurance: [employer] Medicare/medicaid of [place] family planningeffective 11/7/2018SSI disability [employer] medicare effective 10/06/19   [name] an outpatient [facility]in a [facility] facility on December2020 No ED visits or hospital admissions   [name] is able to verify HIPAA [facility] Portability and Accountability Act) identifiers, date of birth (DOB) and address Reviewed and addressed the purpose of the follow up call with the patient- Follow up with warm transfer to [facility] RN CM  Consent:[facility]) RN CM reviewed [facility] services with patient. Patient gave verbal consent for services.  Follow up  [name] reports he is doing well He confirms communication concerns related to the preparation process prior to getting his scheduled colonoscopy, therefore it was cancelled and is to be rescheduled  [facility] RN CM will attempt to follow to see when colonoscopy is rescheduled  [name] received a call, someone waiting on him  He and [facility] RN CM agreed to conclude call    Plans [facility] RN CM will follow up with [name] within the next 30 business days  Goals      Patient Stated   .  [facility]) Matintain My Quality of Life (pt-stated)      Follow Up Date 12/16/20   - discuss my treatment options with the doctor or nurse - do one enjoyable thing every day - make shared treatment decisions with doctor - spend time outdoors at least 3 times a week     Notes:     .  [facility]) Stop or Cut Down Tobacco Use (pt-stated)      Follow Up Date 12/16/20   - cut down amount  of tobacco product used (chew, cigars)     Notes:     .  [facility]) Track and Manage My Symptoms (pt-stated)      Follow Up Date 12/16/20   - eliminate symptom triggers at home - follow rescue plan if symptoms flare-up - keep follow-up appointments      Notes:         [name], RN, BSN, CCM [facility] Telephonic Care Management Care Coordinator Office number [phone] Main [facility] number [phone] Fax number [phone]

## 2020-11-29 ENCOUNTER — Other Ambulatory Visit: Payer: Self-pay

## 2020-11-29 ENCOUNTER — Ambulatory Visit (INDEPENDENT_AMBULATORY_CARE_PROVIDER_SITE_OTHER): Payer: Medicare Other | Admitting: Family Medicine

## 2020-11-29 ENCOUNTER — Encounter: Payer: Self-pay | Admitting: Family Medicine

## 2020-11-29 VITALS — BP 180/110 | HR 52 | Temp 98.6°F | Resp 15 | Ht 71.0 in | Wt 134.0 lb

## 2020-11-29 DIAGNOSIS — K529 Noninfective gastroenteritis and colitis, unspecified: Secondary | ICD-10-CM | POA: Diagnosis not present

## 2020-11-29 DIAGNOSIS — I2510 Atherosclerotic heart disease of native coronary artery without angina pectoris: Secondary | ICD-10-CM

## 2020-11-29 DIAGNOSIS — E441 Mild protein-calorie malnutrition: Secondary | ICD-10-CM

## 2020-11-29 DIAGNOSIS — I10 Essential (primary) hypertension: Secondary | ICD-10-CM | POA: Diagnosis not present

## 2020-11-29 DIAGNOSIS — F1029 Alcohol dependence with unspecified alcohol-induced disorder: Secondary | ICD-10-CM

## 2020-11-29 DIAGNOSIS — J432 Centrilobular emphysema: Secondary | ICD-10-CM

## 2020-11-29 DIAGNOSIS — F17218 Nicotine dependence, cigarettes, with other nicotine-induced disorders: Secondary | ICD-10-CM | POA: Diagnosis not present

## 2020-11-29 DIAGNOSIS — Z13220 Encounter for screening for lipoid disorders: Secondary | ICD-10-CM

## 2020-11-29 DIAGNOSIS — A084 Viral intestinal infection, unspecified: Secondary | ICD-10-CM

## 2020-11-29 MED ORDER — ONDANSETRON HCL 4 MG PO TABS: 4.0000 mg | ORAL_TABLET | Freq: Three times a day (TID) | ORAL | 0 refills | Status: DC | PRN

## 2020-11-29 MED ORDER — AMLODIPINE BESYLATE 5 MG PO TABS: 5.0000 mg | ORAL_TABLET | Freq: Every day | ORAL | 1 refills | Status: DC

## 2020-11-29 MED ORDER — TRIAMTERENE-HCTZ 37.5-25 MG PO TABS: 1.0000 | ORAL_TABLET | Freq: Every day | ORAL | 1 refills | Status: DC

## 2020-11-29 MED ORDER — DIPHENOXYLATE-ATROPINE 2.5-0.025 MG PO TABS: 1.0000 | ORAL_TABLET | Freq: Four times a day (QID) | ORAL | 0 refills | Status: DC | PRN

## 2020-11-29 MED ORDER — ONDANSETRON HCL 4 MG/2ML IJ SOLN
4.0000 mg | Freq: Once | INTRAMUSCULAR | Status: AC
  Administered 2020-11-29: 4 mg via INTRAMUSCULAR

## 2020-11-29 NOTE — Patient Instructions (Addendum)
Follow-up in office to reevaluate blood pressure in 6 weeks call if you need me sooner.  Labs today CMP lipid and EGFR.  For  acute gastroenteritis Zofran 4 mg IM in the office.  Zofran tablets and Lomotil capsules are prescribed for control of your nausea vomiting and diarrhea also.  Please ensure you keep hydrated by drinking small amounts of liquid often.  Your blood pressure is extremely high today.  2 medications are sent to your pharmacy.  It is important you take them every day for blood pressure they are triamterene and amlodipine.  Please commit to smoking no more.  You report not having smoked since Saturday and reports having cut down on the number of cigarettes you need to quit.  Remaining alcohol free is in your best interest so you report no alcohol please stay away from alcohol as this is extremely damaging to your health.  Nursing Staff will follow through with your colonoscopy and assist with getting this arranged.    Best for 2022!

## 2020-11-30 LAB — CMP14+EGFR
ALT: 61 IU/L — ABNORMAL HIGH (ref 0–44)
AST: 57 IU/L — ABNORMAL HIGH (ref 0–40)
Albumin/Globulin Ratio: 1.4 (ref 1.2–2.2)
Albumin: 5 g/dL — ABNORMAL HIGH (ref 3.8–4.8)
Alkaline Phosphatase: 100 IU/L (ref 44–121)
BUN/Creatinine Ratio: 10 (ref 10–24)
BUN: 13 mg/dL (ref 8–27)
Bilirubin Total: 1 mg/dL (ref 0.0–1.2)
CO2: 12 mmol/L — ABNORMAL LOW (ref 20–29)
Calcium: 10.5 mg/dL — ABNORMAL HIGH (ref 8.6–10.2)
Chloride: 97 mmol/L (ref 96–106)
Creatinine, Ser: 1.33 mg/dL — ABNORMAL HIGH (ref 0.76–1.27)
GFR calc Af Amer: 66 mL/min/{1.73_m2} (ref >59)
GFR calc non Af Amer: 57 mL/min/{1.73_m2} — ABNORMAL LOW (ref >59)
Globulin, Total: 3.6 g/dL (ref 1.5–4.5)
Glucose: 100 mg/dL — ABNORMAL HIGH (ref 65–99)
Potassium: 5.3 mmol/L — ABNORMAL HIGH (ref 3.5–5.2)
Sodium: 135 mmol/L (ref 134–144)
Total Protein: 8.6 g/dL — ABNORMAL HIGH (ref 6.0–8.5)

## 2020-11-30 LAB — LIPID PANEL
Chol/HDL Ratio: 2 ratio (ref 0.0–5.0)
Cholesterol, Total: 201 mg/dL — ABNORMAL HIGH (ref 100–199)
HDL: 101 mg/dL (ref >39)
LDL Chol Calc (NIH): 81 mg/dL (ref 0–99)
Triglycerides: 111 mg/dL (ref 0–149)
VLDL Cholesterol Cal: 19 mg/dL (ref 5–40)

## 2020-11-30 MED ORDER — BUSPIRONE HCL 5 MG PO TABS
5.0000 mg | ORAL_TABLET | Freq: Three times a day (TID) | ORAL | 3 refills | Status: DC
Start: 2020-11-30 — End: 2023-03-23

## 2020-11-30 MED ORDER — IPRATROPIUM-ALBUTEROL 0.5-2.5 (3) MG/3ML IN SOLN: RESPIRATORY_TRACT | 6 refills | Status: DC

## 2020-11-30 MED ORDER — DULERA 200-5 MCG/ACT IN AERO: 2.0000 | INHALATION_SPRAY | Freq: Two times a day (BID) | RESPIRATORY_TRACT | 3 refills | Status: DC

## 2020-11-30 MED ORDER — ROSUVASTATIN CALCIUM 5 MG PO TABS: 5.0000 mg | ORAL_TABLET | Freq: Every day | ORAL | 3 refills | Status: DC

## 2020-11-30 MED ORDER — ALBUTEROL SULFATE HFA 108 (90 BASE) MCG/ACT IN AERS: 2.0000 | INHALATION_SPRAY | Freq: Four times a day (QID) | RESPIRATORY_TRACT | 2 refills | Status: DC | PRN

## 2020-12-02 IMAGING — CT CT CHEST W/O CM
1 series · 15 of 34 positions shown, 19 images · non-contrast
Comparison: 11/04/2015.

CLINICAL DATA: Follow-up lung nodule.

EXAM:
CT CHEST WITHOUT CONTRAST
TECHNIQUE: Multidetector CT imaging of the chest was performed following the
standard protocol without IV contrast.

[Series 2: chest w/(date) · axial · 0.66mm/px · z∈[-383,-91]mm · 15 of 172 slices shown, 19 images]
[im 13/172  mediastinal]
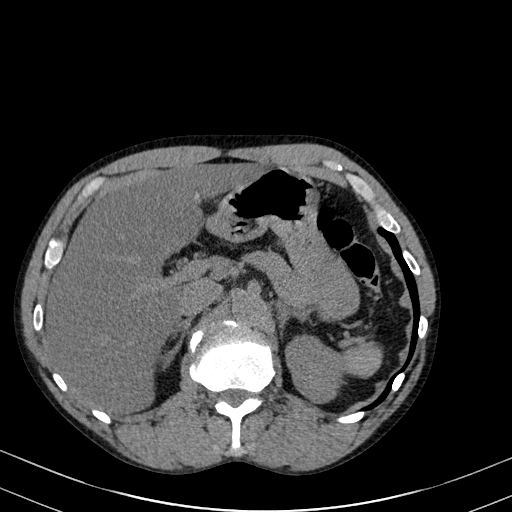
[im 13/172  lung]
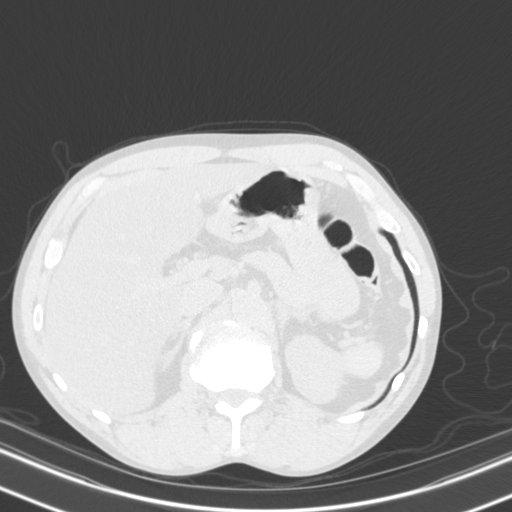
[im 26/172  lung]
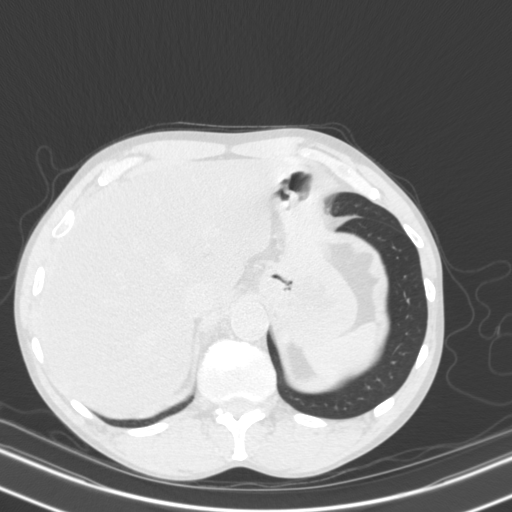
[im 35/172  lung]
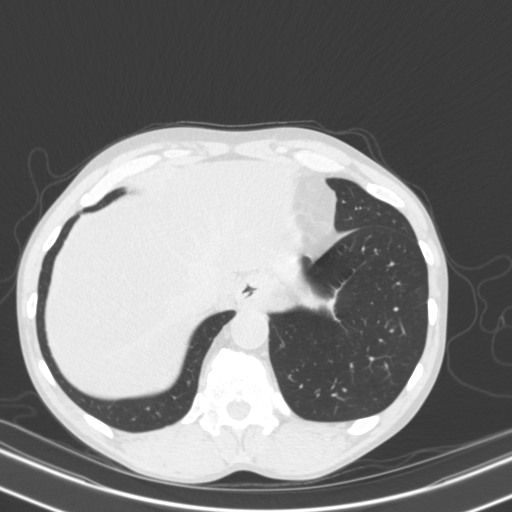
[im 45/172  lung]
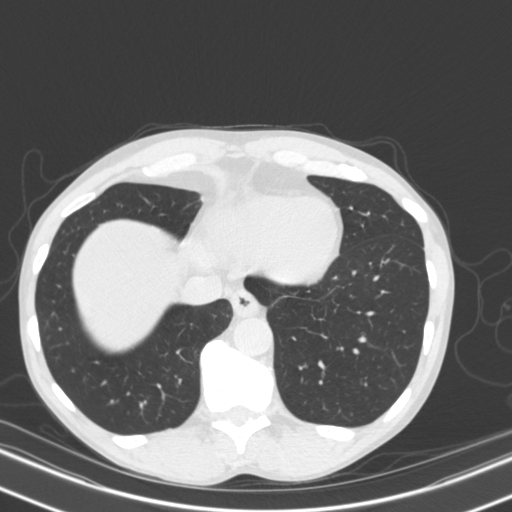
[im 58/172  mediastinal]
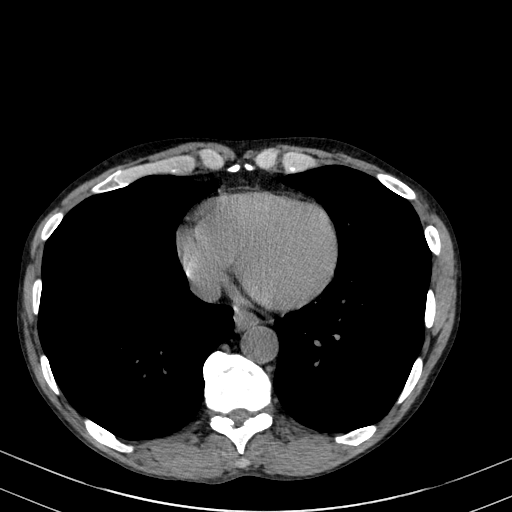
[im 58/172  lung]
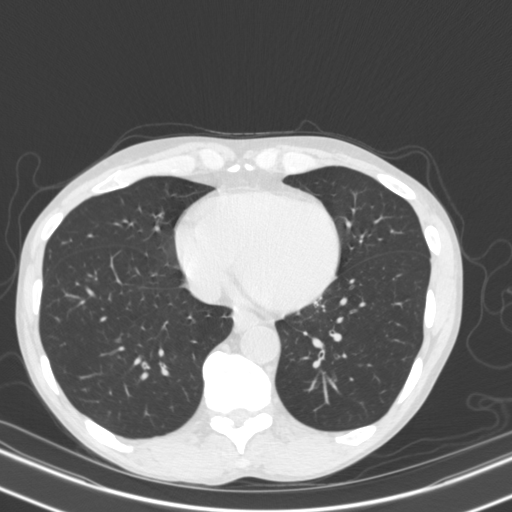
[im 69/172  lung]
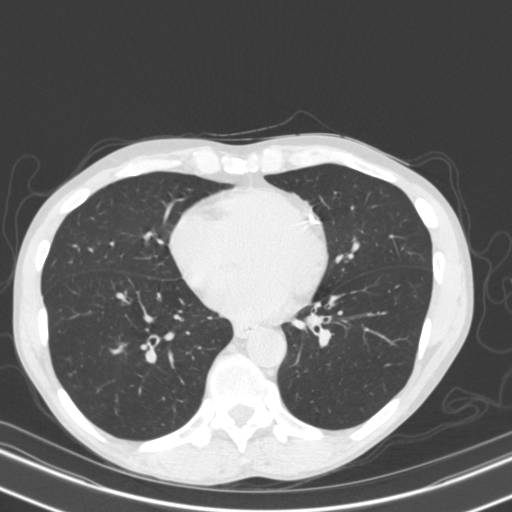
[im 77/172  lung]
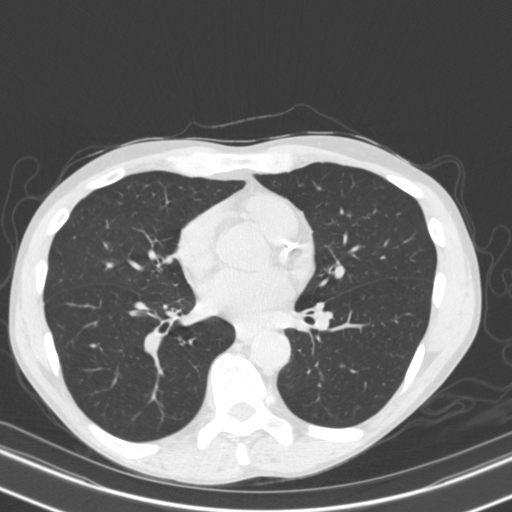
[im 89/172  lung]
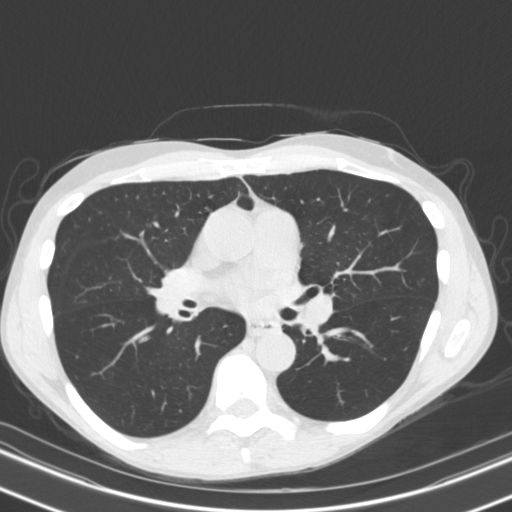
[im 96/172  mediastinal]
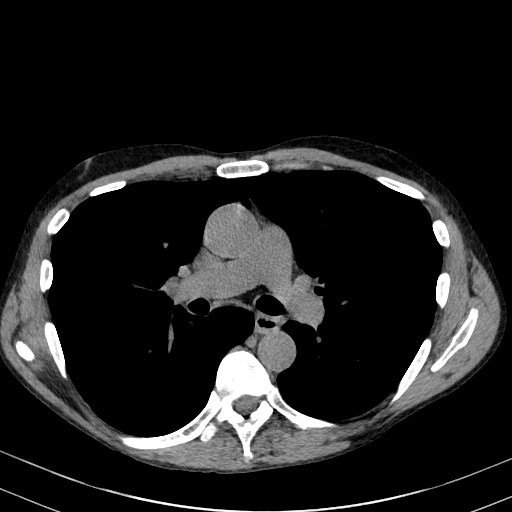
[im 96/172  lung]
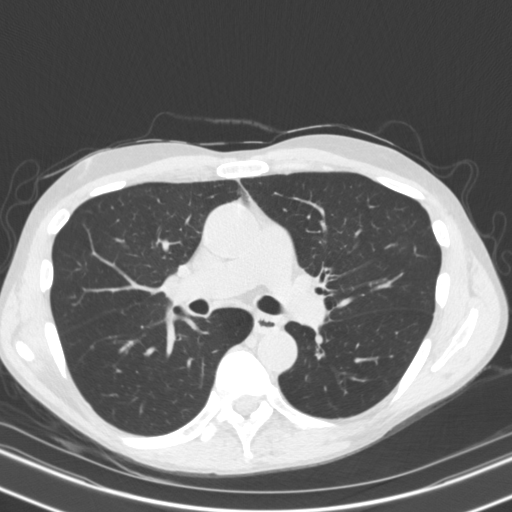
[im 103/172  lung]
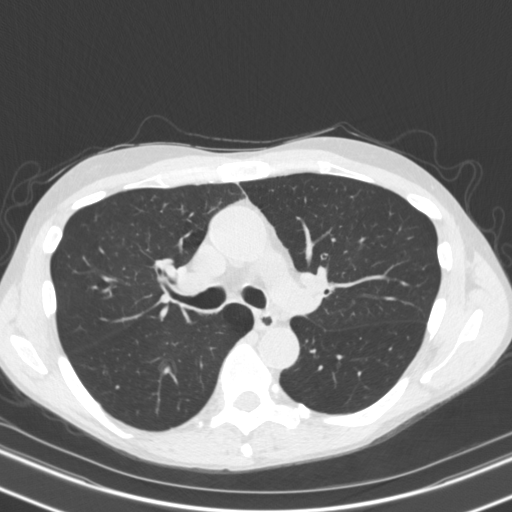
[im 115/172  lung]
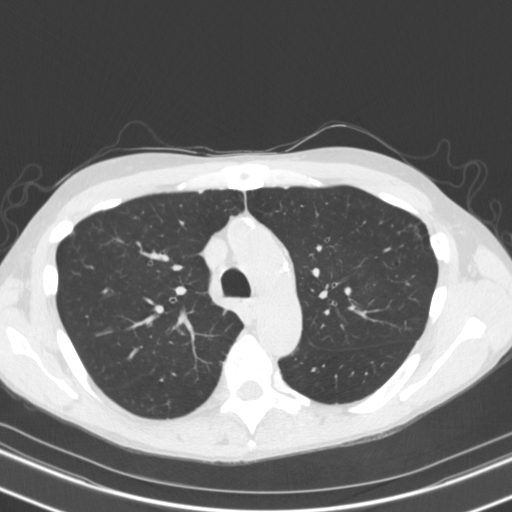
[im 127/172  lung]
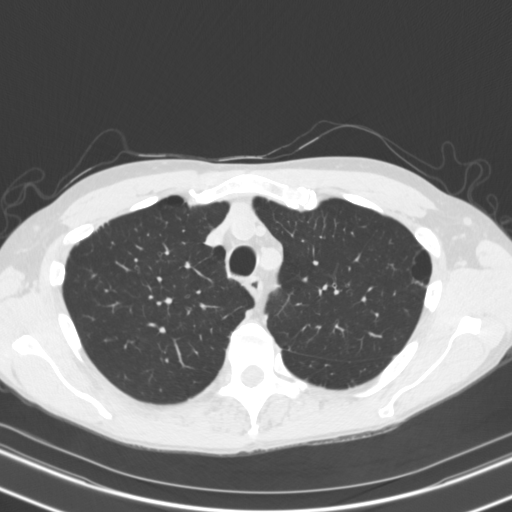
[im 137/172  mediastinal]
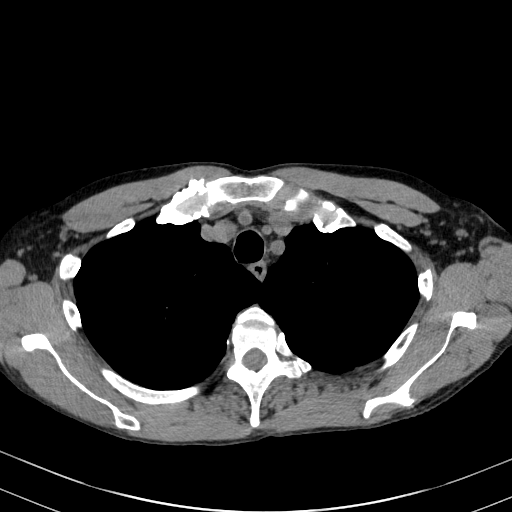
[im 137/172  lung]
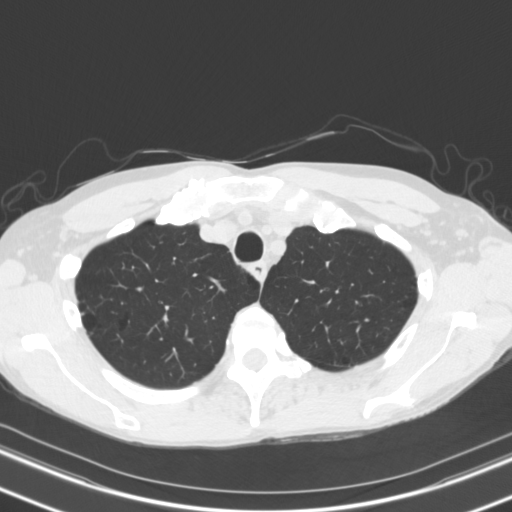
[im 146/172  lung]
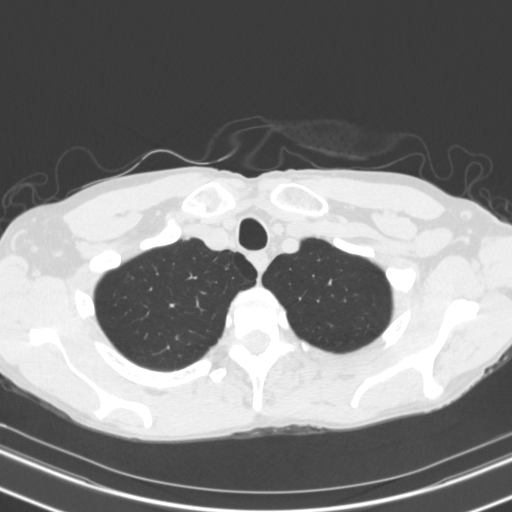
[im 159/172  lung]
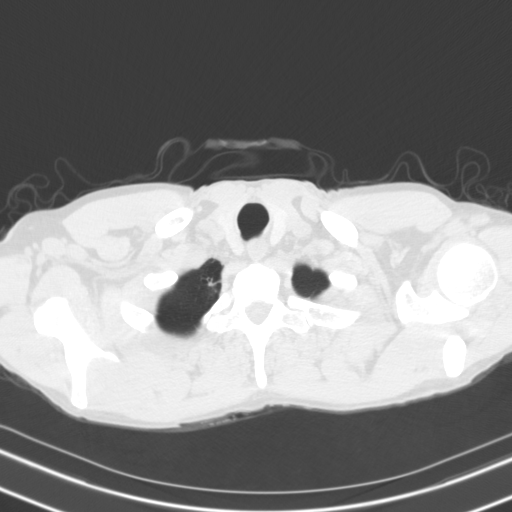

[15 of 34 positions shown; findings below may reference images not displayed]

FINDINGS: Cardiovascular: The heart size appears within normal limits. Aortic
atherosclerosis. Coronary artery atherosclerotic calcifications.

Mediastinum/Nodes: No enlarged mediastinal or axillary lymph nodes.
Thyroid gland, trachea, and esophagus demonstrate no significant
findings.

Lungs/Pleura: Paraseptal and centrilobular emphysema identified
bilaterally. Diffuse bronchial wall thickening. Scattered nodular
densities are identified in both lungs and appear unchanged from
4471. The largest nodule is in the periphery of the lingula
measuring 4 mm, image 74/5 no new or suspicious lung nodules.

Upper Abdomen: Small hiatal hernia. Diffuse hepatic steatosis noted.

Musculoskeletal: No chest wall mass or suspicious bone lesions
identified.
IMPRESSION: 1. No acute cardiopulmonary abnormalities.
2. Stable small pulmonary nodules compatible with a benign
abnormality. No further follow-up is indicated at this time. This
recommendation follows the consensus statement: Guidelines for
Management of Incidental Pulmonary Nodules Detected on CT Images:
3. Diffuse bronchial wall thickening with emphysema, as above;
imaging findings suggestive of underlying COPD.
4. Coronary artery calcifications noted.
5. Hepatic steatosis.
6. Small hiatal hernia.
7. Aortic Atherosclerosis (UIM6W-QMU.U) and Emphysema (UIM6W-BDJ.A).

## 2020-12-04 ENCOUNTER — Encounter: Payer: Self-pay | Admitting: Family Medicine

## 2020-12-04 NOTE — Assessment & Plan Note (Signed)
Uncontrolled, med sent with close  Follow up

## 2020-12-04 NOTE — Assessment & Plan Note (Signed)
Denies recent or current use, however LFT elevated, blood pressure elevated and under weight, encouraged him again to seek help with addiction, however in denial

## 2020-12-04 NOTE — Assessment & Plan Note (Signed)
Asked:confirms currently smokes cigarettes °Assess: Unwilling to set a quit date, but is cutting back °Advise: needs to QUIT to reduce risk of cancer, cardio and cerebrovascular disease °Assist: counseled for 5 minutes and literature provided °Arrange: follow up in 2 to 4 months ° °

## 2020-12-04 NOTE — Assessment & Plan Note (Signed)
Importance of regular eating to gain weight is discussed, currently acutely ill, however , ongoing alcohol use is the likely cause

## 2020-12-04 NOTE — Assessment & Plan Note (Signed)
zofran IM , followed by oral zofran and lomotil also educated re need to hydrate

## 2020-12-04 NOTE — Progress Notes (Signed)
   [name]     MRN: [id number]      DOB: 01-28-1956   HPI [name] is here for follow up and re-evaluation of chronic medical conditions, medication management and review of any available recent lab and radiology data.  Preventive health is updated, specifically  Cancer screening and Immunization.   3 day h/o vomiting and loose stool, increased stress as his Mother is also ill  ROS Denies recent fever or chills. Denies sinus pressure, nasal congestion, ear pain or sore throat. Denies chest congestion, productive cough or wheezing. Denies chest pains, palpitations and leg swelling .   Denies dysuria, frequency, hesitancy or incontinence. Denies joint pain, swelling and limitation in mobility. Denies headaches, seizures, numbness, or tingling. Denies depression, anxiety or insomnia. Denies skin break down or rash.   PE  BP (!) 180/110   Pulse (!) 52   Temp 98.6 F (37 C) (Oral)   Resp 15   Ht 5\' 11"  (1.803 m)   Wt 134 lb (60.8 kg)   SpO2 97%   BMI 18.69 kg/m   Patient alert and oriented cachectic, chronically ill appearing HEENT: No facial asymmetry, EOMI,     Neck supple .  Chest: Clear to auscultation bilaterally.decreased air entry  CVS: S1, S2 no murmurs, no S3.Regular rate.  ABD: Soft , hyperactive BS, diffuse superficial tenderness   Ext: No edema  MS: Adequate though reduced ROM spine, shoulders, hips and knees.  Skin: Intact, no ulcerations or rash noted.  Psych: Good eye contact, normal affect. Memory intact mildly  anxious no depressed appearing.  CNS: CN 2-12 intact, power,  normal throughout.no focal deficits noted.   Assessment & Plan  Nicotine dependence, cigarettes, with other nicotine-induced disorders Asked:confirms currently smokes cigarettes Assess: Unwilling to set a quit date, but is cutting back Advise: needs to QUIT to reduce risk of cancer, cardio and cerebrovascular disease Assist: counseled for 5 minutes and literature  provided Arrange: follow up in 2 to 4 months   Essential hypertension Uncontrolled, med sent with close  Follow up  Alcohol addiction (HCC) Denies recent or current use, however LFT elevated, blood pressure elevated and under weight, encouraged him again to seek help with addiction, however in denial  Viral gastroenteritis zofran IM , followed by oral zofran and lomotil also educated re need to hydrate  MALNUTRITION, MILD Importance of regular eating to gain weight is discussed, currently acutely ill, however , ongoing alcohol use is the likely cause

## 2020-12-07 ENCOUNTER — Other Ambulatory Visit: Payer: Medicare Other | Admitting: *Deleted

## 2020-12-12 ENCOUNTER — Other Ambulatory Visit: Payer: Self-pay

## 2020-12-12 ENCOUNTER — Other Ambulatory Visit: Payer: Self-pay | Admitting: *Deleted

## 2020-12-12 NOTE — Patient Outreach (Signed)
[facility]) Care Management  12/12/2020  [name] October 26, 1956 [id number]   Telephoneoutreach forcomplex carefollow up Referral Date:09/04/19 Referral Source:MD referral -Dr [name] office Referral Reason:pt has medicare but can't afford medications does not have drug coverage- has emphysema -Resolved medicare coverage issue in October 2020 Insurance: [employer] Medicare/[place] family planningeffective 11/7/2018SSI disability [employer] medicare effective 10/06/19    [name] is able to verify HIPAA [facility] Portability and Accountability Act) identifiers, date of birth (DOB) and address Reviewed and addressed the purpose of the follow up call with the patient- Follow up with warm transfer to [facility] RN CM  Consent:[facility]) RN CM reviewed [facility] [name] with patient. Patient gave verbal consent for [name].  Follow up   He is doing well except reports an admission of his mother (CKD) He confirms he has not had the colonoscopy rescheduled but had a pcp visit with Dr Lodema Hong on 11/30/20   Conference call to Dr Levon Hedger office [phone] A message was left requesting a return call to Nicholas [name] call to Dr Lodema Hong office to follow up with the referral for rescheduling his colonoscopy Spoke with Vernona Rieger   Plans Patient agrees to care plan and follow up Pt encouraged to return a call to [facility] RN CM prn Routed note to MD Goals Addressed              This Visit's Progress     Patient Stated   .  [facility]) Make and Keep All Appointments (pt-stated)        Timeframe:  Short-Term Goal Priority:  Medium Start Date:           12/12/20                  Expected End Date:          02/13/21             Follow Up Date 01/06/21    - ask family or friend for a ride - keep a calendar with appointment dates   Notes:  12/12/20 calls to GI and pcp to assist with rescheduling of missed 11/16/20  colonoscopy    .  [facility]) Maintain My Quality of Life (pt-stated)   On track     Follow Up Date 01/06/21   - discuss my treatment options with the doctor or nurse - do one enjoyable thing every day - make shared treatment decisions with doctor - spend time outdoors at least 3 times a week     Notes:     .  [facility]) Stop or Cut Down Tobacco Use (pt-stated)   On track     Follow Up Date 01/06/21   - cut down amount of tobacco product used (chew, cigars)     Notes:     .  [facility]) Track and Manage My Symptoms (pt-stated)   On track     Follow Up Date 01/06/21   - eliminate symptom triggers at home - follow rescue plan if symptoms flare-up - keep follow-up appointments       Notes:         [name], RN, BSN, CCM [facility] Telephonic Care Management Care Coordinator Office number [phone] [facility] number [phone] Fax number [phone]

## 2020-12-13 ENCOUNTER — Other Ambulatory Visit: Payer: Self-pay | Admitting: Family Medicine

## 2020-12-13 MED ORDER — FLUTICASONE PROPIONATE (INHAL) 50 MCG/BLIST IN AEPB: 1.0000 | INHALATION_SPRAY | Freq: Two times a day (BID) | RESPIRATORY_TRACT | 12 refills | Status: DC

## 2020-12-15 ENCOUNTER — Inpatient Hospital Stay (HOSPITAL_COMMUNITY)
Admission: EM | Admit: 2020-12-15 | Discharge: 2020-12-17 | DRG: 645 | Disposition: A | Discharge status: Home / Self Care | Payer: Medicare Other | Attending: Internal Medicine | Admitting: Internal Medicine

## 2020-12-15 ENCOUNTER — Inpatient Hospital Stay (HOSPITAL_COMMUNITY): Payer: Medicare Other

## 2020-12-15 ENCOUNTER — Emergency Department (HOSPITAL_COMMUNITY): Payer: Medicare Other

## 2020-12-15 ENCOUNTER — Encounter (HOSPITAL_COMMUNITY): Payer: Self-pay | Admitting: Emergency Medicine

## 2020-12-15 ENCOUNTER — Other Ambulatory Visit: Payer: Self-pay

## 2020-12-15 DIAGNOSIS — F419 Anxiety disorder, unspecified: Secondary | ICD-10-CM | POA: Diagnosis present

## 2020-12-15 DIAGNOSIS — E222 Syndrome of inappropriate secretion of antidiuretic hormone: Principal | ICD-10-CM | POA: Diagnosis present

## 2020-12-15 DIAGNOSIS — I2510 Atherosclerotic heart disease of native coronary artery without angina pectoris: Secondary | ICD-10-CM | POA: Diagnosis present

## 2020-12-15 DIAGNOSIS — F17210 Nicotine dependence, cigarettes, uncomplicated: Secondary | ICD-10-CM | POA: Diagnosis present

## 2020-12-15 DIAGNOSIS — J449 Chronic obstructive pulmonary disease, unspecified: Secondary | ICD-10-CM | POA: Diagnosis present

## 2020-12-15 DIAGNOSIS — I159 Secondary hypertension, unspecified: Secondary | ICD-10-CM

## 2020-12-15 DIAGNOSIS — T502X5A Adverse effect of carbonic-anhydrase inhibitors, benzothiadiazides and other diuretics, initial encounter: Secondary | ICD-10-CM | POA: Diagnosis present

## 2020-12-15 DIAGNOSIS — F10259 Alcohol dependence with alcohol-induced psychotic disorder, unspecified: Secondary | ICD-10-CM

## 2020-12-15 DIAGNOSIS — E871 Hypo-osmolality and hyponatremia: Secondary | ICD-10-CM | POA: Diagnosis present

## 2020-12-15 DIAGNOSIS — E785 Hyperlipidemia, unspecified: Secondary | ICD-10-CM | POA: Diagnosis present

## 2020-12-15 DIAGNOSIS — R7302 Impaired glucose tolerance (oral): Secondary | ICD-10-CM | POA: Diagnosis present

## 2020-12-15 DIAGNOSIS — Z66 Do not resuscitate: Secondary | ICD-10-CM | POA: Diagnosis present

## 2020-12-15 DIAGNOSIS — Z8249 Family history of ischemic heart disease and other diseases of the circulatory system: Secondary | ICD-10-CM | POA: Diagnosis not present

## 2020-12-15 DIAGNOSIS — F1020 Alcohol dependence, uncomplicated: Secondary | ICD-10-CM | POA: Diagnosis present

## 2020-12-15 DIAGNOSIS — G621 Alcoholic polyneuropathy: Secondary | ICD-10-CM | POA: Diagnosis present

## 2020-12-15 DIAGNOSIS — Z833 Family history of diabetes mellitus: Secondary | ICD-10-CM

## 2020-12-15 DIAGNOSIS — Z720 Tobacco use: Secondary | ICD-10-CM | POA: Diagnosis not present

## 2020-12-15 DIAGNOSIS — Z20822 Contact with and (suspected) exposure to covid-19: Secondary | ICD-10-CM | POA: Diagnosis present

## 2020-12-15 DIAGNOSIS — Z79899 Other long term (current) drug therapy: Secondary | ICD-10-CM

## 2020-12-15 DIAGNOSIS — Z888 Allergy status to other drugs, medicaments and biological substances status: Secondary | ICD-10-CM

## 2020-12-15 DIAGNOSIS — M25551 Pain in right hip: Secondary | ICD-10-CM

## 2020-12-15 DIAGNOSIS — M48061 Spinal stenosis, lumbar region without neurogenic claudication: Secondary | ICD-10-CM

## 2020-12-15 DIAGNOSIS — M5441 Lumbago with sciatica, right side: Secondary | ICD-10-CM

## 2020-12-15 LAB — CBC
HCT: 49.6 % (ref 39.0–52.0)
Hemoglobin: 16.4 g/dL (ref 13.0–17.0)
MCH: 29.2 pg (ref 26.0–34.0)
MCHC: 33.1 g/dL (ref 30.0–36.0)
MCV: 88.4 fL (ref 80.0–100.0)
Platelets: 101 10*3/uL — ABNORMAL LOW (ref 150–400)
RBC: 5.61 MIL/uL (ref 4.22–5.81)
RDW: 12.8 % (ref 11.5–15.5)
WBC: 7.4 10*3/uL (ref 4.0–10.5)
nRBC: 0 % (ref 0.0–0.2)

## 2020-12-15 LAB — CBC WITH DIFFERENTIAL/PLATELET
Abs Immature Granulocytes: 0.08 10*3/uL — ABNORMAL HIGH (ref 0.00–0.07)
Basophils Absolute: 0.1 10*3/uL (ref 0.0–0.1)
Basophils Relative: 1 %
Eosinophils Absolute: 0.1 10*3/uL (ref 0.0–0.5)
Eosinophils Relative: 1 %
HCT: 43.7 % (ref 39.0–52.0)
Hemoglobin: 14.8 g/dL (ref 13.0–17.0)
Immature Granulocytes: 1 %
Lymphocytes Relative: 19 %
Lymphs Abs: 1.6 10*3/uL (ref 0.7–4.0)
MCH: 29.3 pg (ref 26.0–34.0)
MCHC: 33.9 g/dL (ref 30.0–36.0)
MCV: 86.5 fL (ref 80.0–100.0)
Monocytes Absolute: 1.9 10*3/uL — ABNORMAL HIGH (ref 0.1–1.0)
Monocytes Relative: 22 %
Neutro Abs: 4.9 10*3/uL (ref 1.7–7.7)
Neutrophils Relative %: 56 %
Platelets: 233 10*3/uL (ref 150–400)
RBC: 5.05 MIL/uL (ref 4.22–5.81)
RDW: 12.8 % (ref 11.5–15.5)
WBC: 8.6 10*3/uL (ref 4.0–10.5)
nRBC: 0 % (ref 0.0–0.2)

## 2020-12-15 LAB — OSMOLALITY: Osmolality: 267 mOsm/kg — ABNORMAL LOW (ref 275–295)

## 2020-12-15 LAB — RAPID URINE DRUG SCREEN, HOSP PERFORMED
Amphetamines: NOT DETECTED
Barbiturates: NOT DETECTED
Benzodiazepines: NOT DETECTED
Cocaine: NOT DETECTED
Opiates: NOT DETECTED
Tetrahydrocannabinol: POSITIVE — AB

## 2020-12-15 LAB — URINALYSIS, ROUTINE W REFLEX MICROSCOPIC
Bilirubin Urine: NEGATIVE
Glucose, UA: NEGATIVE mg/dL
Hgb urine dipstick: NEGATIVE
Ketones, ur: 5 mg/dL — AB
Leukocytes,Ua: NEGATIVE
Nitrite: NEGATIVE
Protein, ur: NEGATIVE mg/dL
Specific Gravity, Urine: 1.013 (ref 1.005–1.030)
pH: 5 (ref 5.0–8.0)

## 2020-12-15 LAB — URINALYSIS, COMPLETE (UACMP) WITH MICROSCOPIC
Bacteria, UA: NONE SEEN
Bilirubin Urine: NEGATIVE
Glucose, UA: NEGATIVE mg/dL
Hgb urine dipstick: NEGATIVE
Ketones, ur: 5 mg/dL — AB
Leukocytes,Ua: NEGATIVE
Nitrite: NEGATIVE
Protein, ur: NEGATIVE mg/dL
Specific Gravity, Urine: 1.014 (ref 1.005–1.030)
pH: 5 (ref 5.0–8.0)

## 2020-12-15 LAB — COMPREHENSIVE METABOLIC PANEL
ALT: 52 U/L — ABNORMAL HIGH (ref 0–44)
ALT: 51 U/L — ABNORMAL HIGH (ref 0–44)
AST: 42 U/L — ABNORMAL HIGH (ref 15–41)
AST: 39 U/L (ref 15–41)
Albumin: 4.1 g/dL (ref 3.5–5.0)
Albumin: 4.3 g/dL (ref 3.5–5.0)
Alkaline Phosphatase: 62 U/L (ref 38–126)
Alkaline Phosphatase: 65 U/L (ref 38–126)
Anion gap: 10 (ref 5–15)
Anion gap: 11 (ref 5–15)
BUN: 8 mg/dL (ref 8–23)
BUN: 8 mg/dL (ref 8–23)
CO2: 25 mmol/L (ref 22–32)
CO2: 25 mmol/L (ref 22–32)
Calcium: 8.7 mg/dL — ABNORMAL LOW (ref 8.9–10.3)
Calcium: 9.4 mg/dL (ref 8.9–10.3)
Chloride: 89 mmol/L — ABNORMAL LOW (ref 98–111)
Chloride: 89 mmol/L — ABNORMAL LOW (ref 98–111)
Creatinine, Ser: 0.92 mg/dL (ref 0.61–1.24)
Creatinine, Ser: 0.8 mg/dL (ref 0.61–1.24)
GFR, Estimated: >60 mL/min (ref >60)
GFR, Estimated: >60 mL/min (ref >60)
Glucose, Bld: 122 mg/dL — ABNORMAL HIGH (ref 70–99)
Glucose, Bld: 102 mg/dL — ABNORMAL HIGH (ref 70–99)
Potassium: 4.2 mmol/L (ref 3.5–5.1)
Potassium: 4.3 mmol/L (ref 3.5–5.1)
Sodium: 124 mmol/L — ABNORMAL LOW (ref 135–145)
Sodium: 125 mmol/L — ABNORMAL LOW (ref 135–145)
Total Bilirubin: 1.8 mg/dL — ABNORMAL HIGH (ref 0.3–1.2)
Total Bilirubin: 2.4 mg/dL — ABNORMAL HIGH (ref 0.3–1.2)
Total Protein: 7.4 g/dL (ref 6.5–8.1)
Total Protein: 7.8 g/dL (ref 6.5–8.1)

## 2020-12-15 LAB — FOLATE: Folate: 17.5 ng/mL (ref >5.9)

## 2020-12-15 LAB — MAGNESIUM: Magnesium: 1.9 mg/dL (ref 1.7–2.4)

## 2020-12-15 LAB — SODIUM, URINE, RANDOM: Sodium, Ur: 16 mmol/L

## 2020-12-15 LAB — LACTIC ACID, PLASMA: Lactic Acid, Venous: 1.8 mmol/L (ref 0.5–1.9)

## 2020-12-15 LAB — TSH: TSH: 2.648 u[IU]/mL (ref 0.350–4.500)

## 2020-12-15 LAB — VITAMIN B12: Vitamin B-12: 401 pg/mL (ref 180–914)

## 2020-12-15 LAB — PHOSPHORUS: Phosphorus: 3.9 mg/dL (ref 2.5–4.6)

## 2020-12-15 LAB — HIV ANTIBODY (ROUTINE TESTING W REFLEX): HIV Screen 4th Generation wRfx: NONREACTIVE

## 2020-12-15 LAB — CREATININE, URINE, RANDOM: Creatinine, Urine: 152.66 mg/dL

## 2020-12-15 LAB — OSMOLALITY, URINE: Osmolality, Ur: 449 mOsm/kg (ref 300–900)

## 2020-12-15 LAB — SARS CORONAVIRUS 2 (TAT 6-24 HRS): SARS Coronavirus 2: NEGATIVE

## 2020-12-15 MED ORDER — LORAZEPAM 2 MG/ML IJ SOLN
2.0000 mg | Freq: Once | INTRAMUSCULAR | Status: AC
  Administered 2020-12-15: 2 mg via INTRAVENOUS
  Filled 2020-12-15: qty 1

## 2020-12-15 MED ORDER — LORAZEPAM 2 MG/ML IJ SOLN: 1.0000 mg | INTRAMUSCULAR | Status: DC | PRN

## 2020-12-15 MED ORDER — ENOXAPARIN SODIUM 40 MG/0.4ML ~~LOC~~ SOLN
40.0000 mg | SUBCUTANEOUS | Status: DC
  Administered 2020-12-15 – 2020-12-16 (×2): 40 mg via SUBCUTANEOUS
  Filled 2020-12-15 (×2): qty 0.4

## 2020-12-15 MED ORDER — ADULT MULTIVITAMIN W/MINERALS CH
1.0000 | ORAL_TABLET | Freq: Every day | ORAL | Status: DC
  Administered 2020-12-15 – 2020-12-17 (×3): 1 via ORAL
  Filled 2020-12-15 (×3): qty 1

## 2020-12-15 MED ORDER — THIAMINE HCL 100 MG PO TABS
100.0000 mg | ORAL_TABLET | Freq: Every day | ORAL | Status: DC
  Administered 2020-12-15 – 2020-12-17 (×3): 100 mg via ORAL
  Filled 2020-12-15 (×3): qty 1

## 2020-12-15 MED ORDER — SODIUM CHLORIDE 0.9 % IV BOLUS
500.0000 mL | Freq: Once | INTRAVENOUS | Status: AC
  Administered 2020-12-15: 500 mL via INTRAVENOUS

## 2020-12-15 MED ORDER — HYDROMORPHONE HCL 2 MG/ML IJ SOLN
2.0000 mg | Freq: Once | INTRAMUSCULAR | Status: AC
  Administered 2020-12-15: 2 mg via INTRAVENOUS
  Filled 2020-12-15: qty 1

## 2020-12-15 MED ORDER — AMLODIPINE BESYLATE 5 MG PO TABS
5.0000 mg | ORAL_TABLET | Freq: Every day | ORAL | Status: DC
  Administered 2020-12-15 – 2020-12-17 (×3): 5 mg via ORAL
  Filled 2020-12-15 (×3): qty 1

## 2020-12-15 MED ORDER — ONDANSETRON HCL 4 MG/2ML IJ SOLN: 4.0000 mg | Freq: Four times a day (QID) | INTRAMUSCULAR | Status: DC | PRN

## 2020-12-15 MED ORDER — SODIUM CHLORIDE 0.9 % IV SOLN: INTRAVENOUS | Status: DC

## 2020-12-15 MED ORDER — LORAZEPAM 1 MG PO TABS
1.0000 mg | ORAL_TABLET | ORAL | Status: DC | PRN
  Administered 2020-12-16 (×3): 1 mg via ORAL
  Filled 2020-12-15 (×3): qty 1

## 2020-12-15 MED ORDER — BUDESONIDE 0.5 MG/2ML IN SUSP
0.5000 mg | Freq: Two times a day (BID) | RESPIRATORY_TRACT | Status: DC
  Administered 2020-12-16 – 2020-12-17 (×3): 0.5 mg via RESPIRATORY_TRACT
  Filled 2020-12-15 (×4): qty 2

## 2020-12-15 MED ORDER — ACETAMINOPHEN 325 MG PO TABS
650.0000 mg | ORAL_TABLET | Freq: Four times a day (QID) | ORAL | Status: DC | PRN
  Administered 2020-12-15 – 2020-12-16 (×3): 650 mg via ORAL
  Filled 2020-12-15 (×3): qty 2

## 2020-12-15 MED ORDER — ONDANSETRON 8 MG PO TBDP
8.0000 mg | ORAL_TABLET | Freq: Once | ORAL | Status: AC
  Administered 2020-12-15: 8 mg via ORAL
  Filled 2020-12-15: qty 1

## 2020-12-15 MED ORDER — FOLIC ACID 1 MG PO TABS
1.0000 mg | ORAL_TABLET | Freq: Every day | ORAL | Status: DC
  Administered 2020-12-15 – 2020-12-17 (×3): 1 mg via ORAL
  Filled 2020-12-15 (×3): qty 1

## 2020-12-15 MED ORDER — BUSPIRONE HCL 5 MG PO TABS
5.0000 mg | ORAL_TABLET | Freq: Three times a day (TID) | ORAL | Status: DC
  Administered 2020-12-15 – 2020-12-17 (×6): 5 mg via ORAL
  Filled 2020-12-15 (×6): qty 1

## 2020-12-15 MED ORDER — ACETAMINOPHEN 325 MG PO TABS
650.0000 mg | ORAL_TABLET | Freq: Once | ORAL | Status: AC
  Administered 2020-12-15: 650 mg via ORAL
  Filled 2020-12-15: qty 2

## 2020-12-15 MED ORDER — THIAMINE HCL 100 MG/ML IJ SOLN: 100.0000 mg | Freq: Every day | INTRAMUSCULAR | Status: DC

## 2020-12-15 MED ORDER — IPRATROPIUM-ALBUTEROL 0.5-2.5 (3) MG/3ML IN SOLN
3.0000 mL | Freq: Three times a day (TID) | RESPIRATORY_TRACT | Status: DC
  Administered 2020-12-16 – 2020-12-17 (×4): 3 mL via RESPIRATORY_TRACT
  Filled 2020-12-15 (×5): qty 3

## 2020-12-15 MED ORDER — ACETAMINOPHEN 650 MG RE SUPP: 650.0000 mg | Freq: Four times a day (QID) | RECTAL | Status: DC | PRN

## 2020-12-15 MED ORDER — ROSUVASTATIN CALCIUM 10 MG PO TABS
5.0000 mg | ORAL_TABLET | Freq: Every day | ORAL | Status: DC
  Administered 2020-12-15 – 2020-12-17 (×3): 5 mg via ORAL
  Filled 2020-12-15 (×4): qty 1

## 2020-12-15 MED ORDER — ONDANSETRON HCL 4 MG PO TABS: 4.0000 mg | ORAL_TABLET | Freq: Four times a day (QID) | ORAL | Status: DC | PRN

## 2020-12-15 NOTE — ED Triage Notes (Signed)
Pt c/o lower back pain and now having some urinary incontinent tonight.

## 2020-12-15 NOTE — ED Provider Notes (Signed)
[facility] EMERGENCY DEPARTMENT Provider Note   CSN: [id number] Arrival date & time: 12/15/20  0327     History Chief Complaint  Patient presents with  . Back Pain    [name] is a 63 y.o. male.  HPI He presents for evaluation of recurrent right lower back pain radiating to the right hip region, for several days.  Pain is worse with movement and ambulation.  Yesterday, while hurrying to the bathroom, he had a sensation that he would spontaneously urinate but was never incontinent.  He was having severe pain at the time.  He had similar pain about a year ago, when he was diagnosed with lumbar radiculopathy.  He does not have ongoing chronic back pain or radicular symptoms.  He denies stool incontinence.  He denies weakness or paresthesia of the legs.  He has not noticed decreased sensation between his legs around his genitals.  He denies trauma.  A family member brought him here for evaluation.  The patient's mother is currently hospitalized.  There are no other known modifying factors.    Past Medical History:  Diagnosis Date  . Alcohol abuse   . Allergic rhinitis   . Asthma   . COPD (chronic obstructive pulmonary disease) (HCC)   . Coronary atherosclerosis    Based on chest CT 2012  . Essential hypertension   . History of neutropenia    Also lymphocytosis - followed by Dr. Zigmund Daniel  . Nicotine addiction   . Olecranon bursitis, right elbow     Patient Active Problem List   Diagnosis Date Noted  . Acute gastroenteritis 11/29/2020  . Solitary pulmonary nodule 10/20/2020  . COPD GOLD 3 likely and still smoking  10/18/2020  . Cigarette smoker 10/18/2020  . Viral gastroenteritis 09/29/2020  . Impaired fasting glucose 05/17/2020  . Screening for colorectal cancer 05/17/2020  . Enlarged prostate 05/17/2020  . GAD (generalized anxiety disorder) 08/01/2016  . Centrilobular emphysema (HCC) 11/01/2015  . Alcohol addiction (HCC) 06/23/2015  . Vitamin D deficiency  03/24/2015  . Coronary atherosclerosis 03/11/2014  . Abnormal EKG 02/17/2014  . Depression with anxiety 02/10/2013  . MALNUTRITION, MILD 11/27/2010  . Nicotine dependence, cigarettes, with other nicotine-induced disorders 03/30/2008  . Essential hypertension 03/30/2008    Past Surgical History:  Procedure Laterality Date  . MULTIPLE TOOTH EXTRACTIONS  June 2012  . OLECRANON BURSECTOMY Right 11/26/2019   Procedure: OLECRANON BURSECTOMY;  Surgeon: Vickki Hearing, MD;  Location: [facility];  Service: Orthopedics;  Laterality: Right;  pt knows to arrive at 6:15       Family History  Problem Relation Age of Onset  . Diabetes Mother   . Hypertension Mother   . Diabetes Father   . Hypertension Sister     Social History   Tobacco Use  . Smoking status: Current Every Day Smoker    Packs/day: 0.50    Years: 38.00    Pack years: 19.00    Types: Cigarettes  . Smokeless tobacco: Never Used  . Tobacco comment: smokes 6 cigarettes per day 10/18/2020  Vaping Use  . Vaping Use: Never used  Substance Use Topics  . Alcohol use: Yes    Comment: occasionally a beer  . Drug use: No    Home Medications Prior to Admission medications   Medication Sig Start Date End Date Taking? Authorizing Provider  albuterol (VENTOLIN HFA) 108 (90 Base) MCG/ACT inhaler Inhale 2 puffs into the lungs every 6 (six) hours as needed for wheezing or shortness of  breath. 11/30/20   Kerri PerchesSimpson, Margaret E, MD  amLODipine (NORVASC) 5 MG tablet Take 1 tablet (5 mg total) by mouth daily. 11/29/20   Kerri PerchesSimpson, Margaret E, MD  busPIRone (BUSPAR) 5 MG tablet Take 1 tablet (5 mg total) by mouth 3 (three) times daily. 11/30/20   Kerri PerchesSimpson, Margaret E, MD  diphenoxylate-atropine (LOMOTIL) 2.5-0.025 MG tablet Take 1 tablet by mouth 4 (four) times daily as needed for diarrhea or loose stools. 11/29/20   Kerri PerchesSimpson, Margaret E, MD  fluticasone (FLOVENT DISKUS) 50 MCG/BLIST diskus inhaler Inhale 1 puff into the lungs 2 (two) times  daily. 12/13/20   Kerri PerchesSimpson, Margaret E, MD  GAVILYTE-G 236 g solution Take 4,000 mLs by mouth once. 10/03/20   [provider]  ipratropium-albuterol (DUONEB) 0.5-2.5 (3) MG/3ML SOLN Every 6 hours as needed. 11/30/20   Kerri PerchesSimpson, Margaret E, MD  ondansetron (ZOFRAN) 4 MG tablet Take 1 tablet (4 mg total) by mouth every 8 (eight) hours as needed for nausea or vomiting. 11/29/20   Kerri PerchesSimpson, Margaret E, MD  rosuvastatin (CRESTOR) 5 MG tablet Take 1 tablet (5 mg total) by mouth daily. 11/30/20 02/28/21  Kerri PerchesSimpson, Margaret E, MD  triamterene-hydrochlorothiazide (MAXZIDE-25) 37.5-25 MG tablet Take 1 tablet by mouth daily. 11/29/20   Kerri PerchesSimpson, Margaret E, MD    Allergies    Lisinopril  Review of Systems   Review of Systems  All other systems reviewed and are negative.   Physical Exam Updated Vital Signs BP (!) 184/109   Pulse 86   Temp 99.4 F (37.4 C) (Oral)   Resp 15   Ht 5\' 11"  (1.803 m)   Wt 60.8 kg   SpO2 94%   BMI 18.69 kg/m   Physical Exam Vitals and nursing note reviewed.  Constitutional:      Appearance: He is well-developed and well-nourished.  HENT:     Head: Normocephalic and atraumatic.     Right Ear: External ear normal.     Left Ear: External ear normal.  Eyes:     Extraocular Movements: EOM normal.     Conjunctiva/sclera: Conjunctivae normal.     Pupils: Pupils are equal, round, and reactive to light.  Neck:     Trachea: Phonation normal.  Cardiovascular:     Rate and Rhythm: Normal rate.  Pulmonary:     Effort: Pulmonary effort is normal.  Chest:     Chest wall: No bony tenderness.  Abdominal:     General: There is no distension.  Musculoskeletal:        General: No swelling or tenderness. Normal range of motion.     Cervical back: Normal range of motion and neck supple.     Comments: Normal strength arms legs bilaterally.  Moderate tenderness palpation, right lumbar and right posterior pelvic region.  No deformity palpated.  Skin:    General: Skin  is warm, dry and intact.  Neurological:     Mental Status: He is alert and oriented to person, place, and time.     Cranial Nerves: No cranial nerve deficit.     Sensory: No sensory deficit.     Motor: No abnormal muscle tone.     Coordination: Coordination normal.  Psychiatric:        Mood and Affect: Mood and affect and mood normal.        Behavior: Behavior normal.        Thought Content: Thought content normal.        Judgment: Judgment normal.     ED Results /  Procedures / Treatments   Labs (all labs ordered are listed, but only abnormal results are displayed) Labs Reviewed  URINALYSIS, ROUTINE W REFLEX MICROSCOPIC - Abnormal; Notable for the following components:      Result Value   Ketones, ur 5 (*)    All other components within normal limits  COMPREHENSIVE METABOLIC PANEL - Abnormal; Notable for the following components:   Sodium 124 (*)    Chloride 89 (*)    Glucose, Bld 122 (*)    Calcium 8.7 (*)    AST 42 (*)    ALT 52 (*)    Total Bilirubin 1.8 (*)    All other components within normal limits  CBC WITH DIFFERENTIAL/PLATELET - Abnormal; Notable for the following components:   Monocytes Absolute 1.9 (*)    Abs Immature Granulocytes 0.08 (*)    All other components within normal limits  SARS CORONAVIRUS 2 (TAT 6-24 HRS)    EKG None  Radiology DG Lumbar Spine Complete  Result Date: 12/15/2020 CLINICAL DATA:  Sudden onset right lower back pain with urinary incontinence last night. No known injury. EXAM: LUMBAR SPINE - COMPLETE 4+ VIEW COMPARISON:  05/07/2020 FINDINGS: Five non-rib-bearing lumbar vertebrae. Stable mild and moderate lateral and anterior spur formation at multiple levels of the lumbar and lower thoracic spine. No fractures or subluxations. No pars defects. Lower lumbosacral spine facet degenerative changes are again demonstrated. Atheromatous arterial calcifications without visible aneurysm. IMPRESSION: 1. No fracture or subluxation. 2. Stable  degenerative changes. Electronically Signed   By: Beckie Salts M.D.   On: 12/15/2020 09:51    Procedures .Critical Care Performed by: Mancel Bale, MD Authorized by: Mancel Bale, MD   Critical care provider statement:    Critical care time (minutes):  45   Critical care start time:  12/15/2020 7:05 AM   Critical care end time:  12/15/2020 2:59 PM   Critical care time was exclusive of:  Separately billable procedures and treating other patients   Critical care was necessary to treat or prevent imminent or life-threatening deterioration of the following conditions:  Metabolic crisis   Critical care was time spent personally by me on the following activities:  Blood draw for specimens, development of treatment plan with patient or surrogate, discussions with consultants, evaluation of patient's response to treatment, examination of patient, obtaining history from patient or surrogate, ordering and performing treatments and interventions, ordering and review of laboratory studies, pulse oximetry, re-evaluation of patient's condition, review of old charts and ordering and review of radiographic studies   (including critical care time)  Medications Ordered in ED Medications  0.9 %  sodium chloride infusion ( Intravenous New Bag/Given 12/15/20 1252)  sodium chloride 0.9 % bolus 500 mL (has no administration in time range)  acetaminophen (TYLENOL) tablet 650 mg (650 mg Oral Given 12/15/20 0817)  HYDROmorphone (DILAUDID) injection 2 mg (2 mg Intravenous Given 12/15/20 1017)  ondansetron (ZOFRAN-ODT) disintegrating tablet 8 mg (8 mg Oral Given 12/15/20 1015)  LORazepam (ATIVAN) injection 2 mg (2 mg Intravenous Given 12/15/20 1014)    ED Course  I have reviewed the triage vital signs and the nursing notes.  Pertinent labs & imaging results that were available during my care of the patient were reviewed by me and considered in my medical decision making (see chart for details).  Clinical  Course as of 12/15/20 1459  Thu Dec 15, 2020  1412 Had a discussion with the patient's nephew [name].  He states he is with the patient this  morning around 3 AM when he noticed that the patient had urinary incontinence.  [name] is a physician, psychiatry resident at this time.  He states that the patient is a chronic alcoholic, and was concerned that the patient may be in alcohol withdrawal because he was tachycardic this morning.  Patient is also had periods of diaphoresis recently. [EW]    Clinical Course User Index [EW] Mancel Bale, MD   MDM Rules/Calculators/A&P                           Patient Vitals for the past 24 hrs:  BP Temp Temp src Pulse Resp SpO2 Height Weight  12/15/20 1400 (!) 184/109 -- -- 86 15 94 % -- --  12/15/20 1330 (!) 182/105 -- -- 78 16 96 % -- --  12/15/20 1300 (!) 195/114 -- -- 88 15 98 % -- --  12/15/20 1230 (!) 151/109 -- -- 77 13 96 % -- --  12/15/20 1200 (!) 168/112 -- -- 79 13 96 % -- --  12/15/20 1100 (!) 176/112 -- -- 87 18 95 % -- --  12/15/20 0930 (!) 165/100 -- -- 76 -- 97 % -- --  12/15/20 0900 (!) 164/107 -- -- 76 -- 99 % -- --  12/15/20 0830 (!) 173/110 -- -- 78 16 100 % -- --  12/15/20 0730 (!) 159/107 -- -- 80 18 99 % -- --  12/15/20 0632 (!) 174/112 -- -- 84 20 99 % -- --  12/15/20 0355 (!) 130/92 99.4 F (37.4 C) Oral (!) 102 (!) 22 99 % -- --  12/15/20 0353 -- -- -- -- -- -- 5\' 11"  (1.803 m) 60.8 kg    2:6 PM Reevaluation with update and discussion. After initial assessment and treatment, an updated evaluation reveals he is resting comfortably.  Patient's family updated by telephone.   Medical Decision Making:  This patient is presenting for evaluation of right lumbar and buttock pain, which does require a range of treatment options, and is a complaint that involves a moderate risk of morbidity and mortality. The differential diagnoses include radiculopathy, cauda equina syndrome, fracture. I decided to review old  records, and in summary patient with recurrent low back pain, with known degenerative lumbar disease.  I obtained additional historical information from nephew, Mancel Bale, by telephone.  Clinical Laboratory Tests Ordered, included CBC, Metabolic panel and Covid test. Review indicates normal except sodium low, chloride low, glucose high, AST high, ALT high, total bilirubin high. Radiologic Tests Ordered, included lumbar radiography.  I independently Visualized: Radiographic images, which show degenerative changes.  Also ordered MRI lumbar, to rule out potential causes of lumbar myelopathy.    Critical Interventions-clinical evaluation, laboratory testing, IV fluids radiography, observation reassessment  After These Interventions, the Patient was reevaluated and was found patient with findings concerning for cauda equina syndrome historically.  His urinary incontinence with known degenerative changes and difficulty standing and ambulating.  Incidental hyponatremia uncovered, during evaluation for contributory causes.  Patient will require hospitalization.  He has been treated with IV fluids in the ED.  CRITICAL CARE-yes Performed by: [name]  Nursing Notes Reviewed/ Care Coordinated Applicable Imaging Reviewed Interpretation of Laboratory Data incorporated into ED treatment   2:37 PM-Consult complete with hospitalist . Patient case explained and discussed.  He agrees to admit patient for further evaluation and treatment. Call ended at 2:44 PM    Final Clinical Impression(s) / ED Diagnoses Final diagnoses:  Right-sided  low back pain with right-sided sciatica, unspecified chronicity  Hyponatremia  Secondary hypertension  Alcoholism (HCC)    Rx / DC Orders ED Discharge Orders    None       Mancel Bale, MD 12/15/20 1459

## 2020-12-15 NOTE — ED Notes (Signed)
Bladder scanned pt. Pt has 305 ml in bladder.

## 2020-12-15 NOTE — TOC Initial Note (Signed)
Transition of Care [facility]) - Initial/Assessment Note    Patient Details  Name: [name] MRN: [id number] Date of Birth: Dec 17, 1956  [facility]) CM/SW Contact:    Villa Herb, LCSWA Phone Number: 12/15/2020, 9:32 PM  Clinical Narrative:                 TOC consulted due to substance use. CSW spoke with pt over phone to complete assessment. Pt states that he lives with his sister and mother. Pt states that he is independent in completing ADLs. Pt does not currently drive and is provided transportation by his mother or sister. Pt has not had HH. Pt has a walker, wheelchair, and cane to use if needed. Pt states that he drinks a couple beers. CSW inquired about pts interest in SA resources, pts states he is not interested. TOC to follow for possible needs.   Expected Discharge Plan: Home/Self Care Barriers to Discharge: Continued Medical Work up   Patient Goals and CMS Choice Patient states their goals for this hospitalization and ongoing recovery are:: Return home   Choice offered to / list presented to : NA  Expected Discharge Plan and Services Expected Discharge Plan: Home/Self Care In-house Referral: Clinical Social Work Discharge Planning Services: CM Consult Post Acute Care Choice: NA Living arrangements for the past 2 months: Single Family Home                 DME Arranged: N/A DME Agency: NA       HH Arranged: NA HH Agency: NA        Prior Living Arrangements/Services Living arrangements for the past 2 months: Single Family Home Lives with:: Siblings,Parents Patient language and need for interpreter reviewed:: Yes Do you feel safe going back to the place where you live?: Yes      Need for Family Participation in Patient Care: No (Comment) Care giver support system in place?: Yes (comment) Current home services: DME (Cane, walker, wheelchair) Criminal Activity/Legal Involvement Pertinent to Current Situation/Hospitalization: No - Comment as  needed  Activities of Daily Living Home Assistive Devices/Equipment: Cane (specify quad or straight) ADL Screening (condition at time of admission) Patient's cognitive ability adequate to safely complete daily activities?: Yes Is the patient deaf or have difficulty hearing?: No Does the patient have difficulty seeing, even when wearing glasses/contacts?: No Does the patient have difficulty concentrating, remembering, or making decisions?: No Patient able to express need for assistance with ADLs?: Yes Does the patient have difficulty dressing or bathing?: No Independently performs ADLs?: Yes (appropriate for developmental age) Does the patient have difficulty walking or climbing stairs?: No Weakness of Legs: None Weakness of Arms/Hands: None  Permission Sought/Granted                  Emotional Assessment Appearance:: Appears stated age Attitude/Demeanor/Rapport: Engaged Affect (typically observed): Accepting Orientation: : Oriented to Self,Oriented to Place,Oriented to  Time,Oriented to Situation Alcohol / Substance Use: Alcohol Use Psych Involvement: No (comment)  Admission diagnosis:  Hyponatremia [E87.1] Patient Active Problem List   Diagnosis Date Noted  . Hyponatremia 12/15/2020  . Alcohol dependence (HCC) 12/15/2020  . Tobacco abuse 12/15/2020  . Acute gastroenteritis 11/29/2020  . Solitary pulmonary nodule 10/20/2020  . COPD GOLD 3 likely and still smoking  10/18/2020  . Cigarette smoker 10/18/2020  . Viral gastroenteritis 09/29/2020  . Impaired fasting glucose 05/17/2020  . Screening for colorectal cancer 05/17/2020  . Enlarged prostate 05/17/2020  . GAD (generalized anxiety disorder)  08/01/2016  . Centrilobular emphysema (HCC) 11/01/2015  . Alcohol addiction (HCC) 06/23/2015  . Vitamin D deficiency 03/24/2015  . Coronary atherosclerosis 03/11/2014  . Abnormal EKG 02/17/2014  . Depression with anxiety 02/10/2013  . MALNUTRITION, MILD 11/27/2010  .  Nicotine dependence, cigarettes, with other nicotine-induced disorders 03/30/2008  . Essential hypertension 03/30/2008   PCP:  Kerri Perches, MD Pharmacy:   [facility] [place], [place] - [address] [place] [address] Phone: [phone] Fax: [phone]     Social Determinants of Health (SDOH) Interventions    Readmission Risk Interventions No flowsheet data found.

## 2020-12-15 NOTE — H&P (Signed)
History and Physical  [name] SHF:[id number] DOB: Aug 30, 1956 DOA: 12/15/2020   PCP: Kerri Perches, MD   Patient coming from: Home  Chief Complaint:   HPI:  [name] is a 64 y.o. male with medical history of COPD, hypertension, alcohol dependence, anxiety, and impaired glucose tolerance presenting with back pain and right flank pain.  The patient states that he developed " a bone spur on the right side" that resulted in pain on his right flank and right hip region that began on the morning of 12/14/2020 when he was walking to his mailbox.  He he initially felt that this may have been related to his heavy lifting that he recently did in the past 2 weeks.  However, he continued to have worsening pain and difficulty with ambulation.  As result, the patient presented for further evaluation.  He denies any recent injury or falls.  Unfortunately, the patient is a difficult historian.  He is frequently tangential and requires repeated redirection.  Nevertheless, the patient denies any saddle anesthesia type symptoms.  He denies any bowel bladder incontinence.  He denies any frank radicular type symptoms from his back.  He denies any illicit drug use, but states that he drinks 6-8 beers on Saturdays and Sundays.  He denies drinking any alcohol during the week.  He has approximately 40-pack-year history of tobacco.  He has not had any frank dysuria or hematuria.  He denies any headache, fevers, chills, chest pain, shortness breath, cough, hemoptysis, nausea, vomiting, diarrhea, anterior abdominal pain.  In the emergency department, the patient had a low-grade temperature of 99.4 F, but he was hemodynamically stable.  Oxygen saturation was 94-96% on room air.  WBC 8.6.  Otherwise CBC was unremarkable.  Sodium was 124, potassium 4.2, serum creatinine 0.92.  X-ray of the lumbar spine was negative for any fractures or dislocations. The patient continued to have difficulty getting up and  ambulating secondary to pain in his right hip and right flank area.  As result, admission and further work-up was requested as well as for work-up and treatment of his hyponatremia.  Assessment/Plan: Hyponatremia -Likely secondary to the patient's Maxide -The patient certainly may have a degree of SIADH and poor solute intake -Obtain urinalysis with microscopy -Urine sodium -Urine creatinine -Serum osmolarity -Urine osmolarity -TSH -A.m. cortisol -Holding Maxide -Start normal saline  Right flank pain/right hip pain -CT renal stone protocol -Plain x-ray of the right hip -Blood cultures x2 sets -Check lactic acid -Urinalysis with microscopy  Alcohol dependence -Alcohol withdrawal protocol  Tobacco abuse/COPD -Stable on room air -Start duo nebs -Start Pulmicort  Essential hypertension -Restart amlodipine  Anxiety -Continue BuSpar home dose  Hyperlipidemia -Continue Crestor  Alcoholic peripheral neuropathy -Check serum B12 -Check folic acid -TSH  Impaired glucose tolerance -Hemoglobin A1c       Past Medical History:  Diagnosis Date  . Alcohol abuse   . Allergic rhinitis   . Asthma   . COPD (chronic obstructive pulmonary disease) (HCC)   . Coronary atherosclerosis    Based on chest CT 2012  . Essential hypertension   . History of neutropenia    Also lymphocytosis - followed by Dr. Zigmund Daniel  . Nicotine addiction   . Olecranon bursitis, right elbow    Past Surgical History:  Procedure Laterality Date  . MULTIPLE TOOTH EXTRACTIONS  June 2012  . OLECRANON BURSECTOMY Right 11/26/2019   Procedure: OLECRANON BURSECTOMY;  Surgeon: Vickki Hearing, MD;  Location: [facility];  Service: Orthopedics;  Laterality: Right;  pt knows to arrive at 6:15   Social History:  reports that he has been smoking cigarettes. He has a 19.00 pack-year smoking history. He has never used smokeless tobacco. He reports current alcohol use. He reports that he does not use  drugs.   Family History  Problem Relation Age of Onset  . Diabetes Mother   . Hypertension Mother   . Diabetes Father   . Hypertension Sister      Allergies  Allergen Reactions  . Lisinopril Swelling and Other (See Comments)    Reaction:  Angioedema     Prior to Admission medications   Medication Sig Start Date End Date Taking? Authorizing Provider  albuterol (VENTOLIN HFA) 108 (90 Base) MCG/ACT inhaler Inhale 2 puffs into the lungs every 6 (six) hours as needed for wheezing or shortness of breath. 11/30/20   Kerri Perches, MD  amLODipine (NORVASC) 5 MG tablet Take 1 tablet (5 mg total) by mouth daily. 11/29/20   Kerri Perches, MD  busPIRone (BUSPAR) 5 MG tablet Take 1 tablet (5 mg total) by mouth 3 (three) times daily. 11/30/20   Kerri Perches, MD  diphenoxylate-atropine (LOMOTIL) 2.5-0.025 MG tablet Take 1 tablet by mouth 4 (four) times daily as needed for diarrhea or loose stools. 11/29/20   Kerri Perches, MD  fluticasone (FLOVENT DISKUS) 50 MCG/BLIST diskus inhaler Inhale 1 puff into the lungs 2 (two) times daily. 12/13/20   Kerri Perches, MD  GAVILYTE-G 236 g solution Take 4,000 mLs by mouth once. 10/03/20   [provider]  ipratropium-albuterol (DUONEB) 0.5-2.5 (3) MG/3ML SOLN Every 6 hours as needed. 11/30/20   Kerri Perches, MD  ondansetron (ZOFRAN) 4 MG tablet Take 1 tablet (4 mg total) by mouth every 8 (eight) hours as needed for nausea or vomiting. 11/29/20   Kerri Perches, MD  rosuvastatin (CRESTOR) 5 MG tablet Take 1 tablet (5 mg total) by mouth daily. 11/30/20 02/28/21  Kerri Perches, MD  triamterene-hydrochlorothiazide (MAXZIDE-25) 37.5-25 MG tablet Take 1 tablet by mouth daily. 11/29/20   Kerri Perches, MD    Review of Systems:  Constitutional:  No weight loss, night sweats, Fevers, chills, fatigue.  Head&Eyes: No headache.  No vision loss.  No eye pain or scotoma ENT:  No Difficulty  swallowing,Tooth/dental problems,Sore throat,  No ear ache, post nasal drip,  Cardio-vascular:  No chest pain, Orthopnea, PND, swelling in lower extremities,  dizziness, palpitations  GI:  No  abdominal pain, nausea, vomiting, diarrhea, loss of appetite, hematochezia, melena, heartburn, indigestion, Resp:  No shortness of breath with exertion or at rest. No cough. No coughing up of blood .No wheezing.No chest wall deformity  Skin:  no rash or lesions.  GU:  no dysuria, change in color of urine, no urgency or frequency. No flank pain.  Musculoskeletal:  Right hip and right flank pain Psych:  No change in mood or affect. No depression or anxiety. Neurologic: No headache, no dysesthesia, no focal weakness, no vision loss. No syncope  Physical Exam: Vitals:   12/15/20 1330 12/15/20 1400 12/15/20 1430 12/15/20 1506  BP: (!) 182/105 (!) 184/109 (!) 156/100 102/71  Pulse: 78 86  85  Resp: 16 15 17 20   Temp:      TempSrc:      SpO2: 96% 94%  100%  Weight:      Height:       General:  A&O x 3, NAD, nontoxic,  pleasant/cooperative Head/Eye: No conjunctival hemorrhage, no icterus, [place]/AT, No nystagmus ENT:  No icterus,  No thrush, good dentition, no pharyngeal exudate Neck:  No masses, no lymphadenpathy, no bruits CV:  RRR, no rub, no gallop, no S3 Lung:  Bilateral rales. No wheeze Abdomen: soft/NT, +BS, nondistended, no peritoneal signs Ext: No cyanosis, No rashes, No petechiae, No lymphangitis, No edema Neuro: CNII-XII intact, strength 4-/5 in bilateral upper and lower extremities, no dysmetria  Labs on Admission:  Basic Metabolic Panel: Recent Labs  Lab 12/15/20 1202  NA 124*  K 4.2  CL 89*  CO2 25  GLUCOSE 122*  BUN 8  CREATININE 0.92  CALCIUM 8.7*   Liver Function Tests: Recent Labs  Lab 12/15/20 1202  AST 42*  ALT 52*  ALKPHOS 62  BILITOT 1.8*  PROT 7.4  ALBUMIN 4.1   No results for input(s): LIPASE, AMYLASE in the last 168 hours. No results for input(s):  AMMONIA in the last 168 hours. CBC: Recent Labs  Lab 12/15/20 1202  WBC 8.6  NEUTROABS 4.9  HGB 14.8  HCT 43.7  MCV 86.5  PLT 233   Coagulation Profile: No results for input(s): INR, PROTIME in the last 168 hours. Cardiac Enzymes: No results for input(s): CKTOTAL, CKMB, CKMBINDEX, TROPONINI in the last 168 hours. BNP: Invalid input(s): POCBNP CBG: No results for input(s): GLUCAP in the last 168 hours. Urine analysis:    Component Value Date/Time   COLORURINE YELLOW 12/15/2020 1539   APPEARANCEUR CLEAR 12/15/2020 1539   LABSPEC 1.014 12/15/2020 1539   PHURINE 5.0 12/15/2020 1539   GLUCOSEU NEGATIVE 12/15/2020 1539   HGBUR NEGATIVE 12/15/2020 1539   BILIRUBINUR NEGATIVE 12/15/2020 1539   BILIRUBINUR negative 07/29/2019 1007   KETONESUR 5 (A) 12/15/2020 1539   PROTEINUR NEGATIVE 12/15/2020 1539   UROBILINOGEN 0.2 07/29/2019 1007   NITRITE NEGATIVE 12/15/2020 1539   LEUKOCYTESUR NEGATIVE 12/15/2020 1539   Sepsis Labs: @LABRCNTIP (procalcitonin:4,lacticidven:4) )No results found for this or any previous visit (from the past 240 hour(s)).   Radiological Exams on Admission: DG Lumbar Spine Complete  Result Date: 12/15/2020 CLINICAL DATA:  Sudden onset right lower back pain with urinary incontinence last night. No known injury. EXAM: LUMBAR SPINE - COMPLETE 4+ VIEW COMPARISON:  05/07/2020 FINDINGS: Five non-rib-bearing lumbar vertebrae. Stable mild and moderate lateral and anterior spur formation at multiple levels of the lumbar and lower thoracic spine. No fractures or subluxations. No pars defects. Lower lumbosacral spine facet degenerative changes are again demonstrated. Atheromatous arterial calcifications without visible aneurysm. IMPRESSION: 1. No fracture or subluxation. 2. Stable degenerative changes. Electronically Signed   By: 05/09/2020 M.D.   On: 12/15/2020 09:51        Time spent:60 minutes Code Status:   FULL Family Communication:  Nephew  updated Disposition Plan: expect 3 day hospitalization Consults called: none DVT Prophylaxis:  Lovenox  12/17/2020, DO  [facility] Pager [phone]  If 7PM-7AM, please contact night-coverage [email or web address] Password [facility] 12/15/2020, 4:23 PM

## 2020-12-15 NOTE — ED Notes (Signed)
Patient transported to CT 

## 2020-12-15 NOTE — ED Notes (Signed)
Patient transported to MRI 

## 2020-12-15 NOTE — ED Notes (Signed)
Attempted to have pt. Stand for orthostatic VS. Pt. Vomited  and became very diaphoretic. Pt. Was unable to support themselves to stand.

## 2020-12-16 DIAGNOSIS — F1020 Alcohol dependence, uncomplicated: Secondary | ICD-10-CM | POA: Diagnosis not present

## 2020-12-16 DIAGNOSIS — M48061 Spinal stenosis, lumbar region without neurogenic claudication: Secondary | ICD-10-CM

## 2020-12-16 DIAGNOSIS — Z720 Tobacco use: Secondary | ICD-10-CM | POA: Diagnosis not present

## 2020-12-16 DIAGNOSIS — E871 Hypo-osmolality and hyponatremia: Secondary | ICD-10-CM | POA: Diagnosis not present

## 2020-12-16 LAB — BASIC METABOLIC PANEL
Anion gap: 11 (ref 5–15)
BUN: 11 mg/dL (ref 8–23)
CO2: 23 mmol/L (ref 22–32)
Calcium: 8.8 mg/dL — ABNORMAL LOW (ref 8.9–10.3)
Chloride: 94 mmol/L — ABNORMAL LOW (ref 98–111)
Creatinine, Ser: 0.81 mg/dL (ref 0.61–1.24)
GFR, Estimated: >60 mL/min (ref >60)
Glucose, Bld: 92 mg/dL (ref 70–99)
Potassium: 3.7 mmol/L (ref 3.5–5.1)
Sodium: 128 mmol/L — ABNORMAL LOW (ref 135–145)

## 2020-12-16 LAB — MAGNESIUM: Magnesium: 2 mg/dL (ref 1.7–2.4)

## 2020-12-16 LAB — T4, FREE: Free T4: 1.1 ng/dL (ref 0.61–1.12)

## 2020-12-16 LAB — HEMOGLOBIN A1C
Hgb A1c MFr Bld: 4.8 % (ref 4.8–5.6)
Mean Plasma Glucose: 91.06 mg/dL

## 2020-12-16 MED ORDER — METHOCARBAMOL 500 MG PO TABS
500.0000 mg | ORAL_TABLET | Freq: Four times a day (QID) | ORAL | Status: DC | PRN
  Administered 2020-12-16: 500 mg via ORAL
  Filled 2020-12-16: qty 1

## 2020-12-16 MED ORDER — HYDROCODONE-ACETAMINOPHEN 5-325 MG PO TABS
1.0000 | ORAL_TABLET | Freq: Four times a day (QID) | ORAL | Status: DC | PRN
  Administered 2020-12-16: 1 via ORAL
  Filled 2020-12-16: qty 1

## 2020-12-16 MED ORDER — PREDNISONE 20 MG PO TABS
60.0000 mg | ORAL_TABLET | Freq: Every day | ORAL | Status: DC
  Administered 2020-12-16 – 2020-12-17 (×2): 60 mg via ORAL
  Filled 2020-12-16 (×2): qty 3

## 2020-12-16 MED ORDER — OXYCODONE HCL 5 MG PO TABS
5.0000 mg | ORAL_TABLET | Freq: Once | ORAL | Status: AC
Start: 2020-12-16 — End: 2020-12-16
  Administered 2020-12-16: 5 mg via ORAL
  Filled 2020-12-16: qty 1

## 2020-12-16 NOTE — ED Notes (Signed)
Provided pt with urinal

## 2020-12-16 NOTE — TOC Transition Note (Signed)
Transition of Care [facility]) - CM/SW Discharge Note   Patient Details  Name: [name] MRN: [id number] Date of Birth: 05/17/1956  Transition of Care [facility]) CM/SW Contact:  Barry Brunner, LCSW Phone Number: 12/16/2020, 3:55 PM   Clinical Narrative:    CSW made 2x attempt to discuss SA resources with patient. Patient agreeable to review SA residential resources with patient's nephew. CSW received [facility] referral. Patient agreeable to [facility]. CSW referred patient for [facility] agreeable to provide [facility] for patient. TOC signing off.    Final next level of care: Home w Home Health Services Barriers to Discharge: Barriers Resolved   Patient Goals and CMS Choice Patient states their goals for this hospitalization and ongoing recovery are:: Return home with [facility] CMS Medicare.gov Compare Post Acute Care list provided to:: Patient Choice offered to / list presented to : Patient  Discharge Placement                Patient to be transferred to facility by: N/A   Patient and family notified of of transfer: 12/16/20  Discharge Plan and Services In-house Referral: Clinical Social Work Discharge Planning Services: CM Consult Post Acute Care Choice: NA          DME Arranged: N/A DME Agency: NA       HH Arranged: PT HH Agency: [facility] Date HH Agency Contacted: 12/16/20 Time HH Agency Contacted: 1500 Representative spoke with at [facility] Agency: Bonita Quin  Social Determinants of Health (SDOH) Interventions     Readmission Risk Interventions No flowsheet data found.

## 2020-12-16 NOTE — Progress Notes (Signed)
PROGRESS NOTE  [name] PPI:[id number] DOB: 08-19-1956 DOA: 12/15/2020 PCP: Kerri Perches, MD  Brief History:  64 y.o. male with medical history of COPD, hypertension, alcohol dependence, anxiety, and impaired glucose tolerance presenting with back pain and right flank pain.  The patient states that he developed " a bone spur on the right side" that resulted in pain on his right flank and right hip region that began on the morning of 12/14/2020 when he was walking to his mailbox.  He he initially felt that this may have been related to his heavy lifting that he recently did in the past 2 weeks.  However, he continued to have worsening pain and difficulty with ambulation.  As result, the patient presented for further evaluation.  He denies any recent injury or falls.  Unfortunately, the patient is a difficult historian.  He is frequently tangential and requires repeated redirection.  Nevertheless, the patient denies any saddle anesthesia type symptoms.  He denies any bowel bladder incontinence.  He denies any frank radicular type symptoms from his back.  He denies any illicit drug use, but states that he drinks 6-8 beers on Saturdays and Sundays.  He denies drinking any alcohol during the week.  He has approximately 40-pack-year history of tobacco.  He has not had any frank dysuria or hematuria.  He denies any headache, fevers, chills, chest pain, shortness breath, cough, hemoptysis, nausea, vomiting, diarrhea, anterior abdominal pain.  In the emergency department, the patient had a low-grade temperature of 99.4 F, but he was hemodynamically stable.  Oxygen saturation was 94-96% on room air.  WBC 8.6.  Otherwise CBC was unremarkable.  Sodium was 124, potassium 4.2, serum creatinine 0.92.  X-ray of the lumbar spine was negative for any fractures or dislocations. The patient continued to have difficulty getting up and ambulating secondary to pain in his right hip and right flank  area.  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 and poor solute intake --UA--no pyuria -FeNa--0.08% -Serum osmolarity--267 -Urine osmolarity--449 -TSH--2.648 -Holding Maxide -Continue normal saline  Right flank pain/right hip pain -CT renal stone protocol--neg for renal stone; distal esophageal wall thickening -Plain x-ray of Lumbar spine--DJD -MRI- Lumbar spine--severe spinal stenosis L4-5 with severe subarticular and foraminal stenosis bilateral -Blood cultures x2 sets--neg to date -Check lactic acid-1.8 -Urinalysis with microscopy--no pyuria -PT eval-->HHPT  Alcohol dependence -Alcohol withdrawal protocol -no signs of withdrawal  Tobacco abuse/COPD -Stable on room air -Continue duo nebs -Continue Pulmicort  THC use -cessation discussed  Essential hypertension -Restart amlodipine  Anxiety -Continue BuSpar home dose  Hyperlipidemia -Continue Crestor  Alcoholic peripheral neuropathy -Check serum B12--401 -Check folic acid--17.5 -TSH--2.648  Impaired glucose tolerance -Hemoglobin A1c--pending     Status is: Inpatient  Remains inpatient appropriate because:IV treatments appropriate due to intensity of illness or inability to take PO   Dispo: The patient is from: Home              Anticipated d/c is to: Home              Anticipated d/c date is: 1 day              Patient currently is not medically stable to d/c.        Family Communication:  Nephew updated at bedside 12/31  Consultants:  none  Code Status:  DNR  DVT Prophylaxis:  Mason Lovenox   Procedures: As Listed in Progress Note Above  Antibiotics: None    Total time spent 35 minutes.  Greater than 50% spent face to face counseling and coordinating  care.   Subjective: Patient continues to complain or right flank, back pain radiating to buttock.  Objective: Vitals:   12/16/20 0320 12/16/20 0330 12/16/20 0608 12/16/20 1140  BP: (!) 143/107 (!) 149/105 (!) 159/106   Pulse: 90 90 86   Resp: 16 16 18    Temp: 99.6 F (37.6 C) 98.6 F (37 C) (!) 97.5 F (36.4 C)   TempSrc: Oral Oral Oral   SpO2: 100% 98% 100% 98%  Weight:  59.9 kg    Height:  5\' 6"  (1.676 m)      Intake/Output Summary (Last 24 hours) at 12/16/2020 1409 Last data filed at 12/16/2020 0900 Gross per 24 hour  Intake 1382 ml  Output 700 ml  Net 682 ml   Weight change: -0.882 kg Exam:   General:  Pt is alert, follows commands appropriately, not in acute distress  HEENT: No icterus, No thrush, No neck mass, [place]/AT  Cardiovascular: RRR, S1/S2, no rubs, no gallops  Respiratory: bibasilar rales. No wheeze  Abdomen: Soft/+BS, non tender, non distended, no guarding  Extremities: No edema, No lymphangitis, No petechiae, No rashes, no synovitis Neuro:  CN II-XII intact, strength 4-/5 in RUE, RLE, strength 4/5 LUE, LLE; sensation intact bilateral; no dysmetria; babinski equivocal    Data Reviewed: I have personally reviewed following labs and imaging studies Basic Metabolic Panel: Recent Labs  Lab 12/15/20 1202 12/15/20 1810 12/16/20 0459  NA 124* 125* 128*  K 4.2 4.3 3.7  CL 89* 89* 94*  CO2 25 25 23   GLUCOSE 122* 102* 92  BUN 8 8 11   CREATININE 0.92 0.80 0.81  CALCIUM 8.7* 9.4 8.8*  MG  --  1.9 2.0  PHOS  --  3.9  --    Liver Function Tests: Recent Labs  Lab 12/15/20 1202 12/15/20 1810  AST 42* 39  ALT 52* 51*  ALKPHOS 62 65  BILITOT 1.8* 2.4*  PROT 7.4 7.8  ALBUMIN 4.1 4.3   No results for input(s): LIPASE, AMYLASE in the last 168 hours. No results for input(s): AMMONIA in the last 168 hours. Coagulation Profile: No results for input(s): INR, PROTIME in the last 168 hours. CBC: Recent Labs  Lab 12/15/20 1202 12/15/20 1810  WBC  8.6 7.4  NEUTROABS 4.9  --   HGB 14.8 16.4  HCT 43.7 49.6  MCV 86.5 88.4  PLT 233 101*   Cardiac Enzymes: No results for input(s): CKTOTAL, CKMB, CKMBINDEX, TROPONINI in the last 168 hours. BNP: Invalid input(s): POCBNP CBG: No results for input(s): GLUCAP in the last 168 hours. HbA1C: No results for input(s): HGBA1C in the last 72 hours. Urine analysis:    Component Value Date/Time   COLORURINE YELLOW 12/15/2020 1539   APPEARANCEUR CLEAR 12/15/2020 1539   LABSPEC 1.014 12/15/2020 1539   PHURINE 5.0 12/15/2020 1539   GLUCOSEU NEGATIVE 12/15/2020 1539   HGBUR NEGATIVE 12/15/2020 1539   BILIRUBINUR NEGATIVE 12/15/2020 1539   BILIRUBINUR negative 07/29/2019 1007   KETONESUR 5 (A) 12/15/2020 1539   PROTEINUR NEGATIVE 12/15/2020 1539   UROBILINOGEN 0.2 07/29/2019 1007   NITRITE NEGATIVE 12/15/2020 1539   LEUKOCYTESUR NEGATIVE 12/15/2020 1539   Sepsis Labs: @LABRCNTIP (procalcitonin:4,lacticidven:4) ) Recent Results (from the past 240  hour(s))  SARS CORONAVIRUS 2 (Naelle Diegel 6-24 HRS) Nasopharyngeal Nasopharyngeal Swab     Status: None   Collection Time: 12/15/20 12:03 PM   Specimen: Nasopharyngeal Swab  Result Value Ref Range Status   SARS Coronavirus 2 NEGATIVE NEGATIVE Final    Comment: (NOTE) SARS-CoV-2 target nucleic acids are NOT DETECTED.  The SARS-CoV-2 RNA is generally detectable in upper and lower respiratory specimens during the acute phase of infection. Negative results do not preclude SARS-CoV-2 infection, do not rule out co-infections with other pathogens, and should not be used as the sole basis for treatment or other patient management decisions. Negative results must be combined with clinical observations, patient history, and epidemiological information. The expected result is Negative.  Fact Sheet for Patients: HairSlick.nohttps://www.fda.gov/media/138098/download  Fact Sheet for Healthcare Providers: quierodirigir.comhttps://www.fda.gov/media/138095/download  This test is not yet  approved or cleared by the [place] FDA and  has been authorized for detection and/or diagnosis of SARS-CoV-2 by FDA under an Emergency Use Authorization (EUA). This EUA will remain  in effect (meaning this test can be used) for the duration of the COVID-19 declaration under Se ction 564(b)(1) of the Act, 21 U.S.C. section 360bbb-3(b)(1), unless the authorization is terminated or revoked sooner.  Performed at [facility], [address] [place], [place] [address]   Culture, blood (Routine X 2) w Reflex to ID Panel     Status: None (Preliminary result)   Collection Time: 12/15/20  6:09 PM   Specimen: BLOOD RIGHT HAND  Result Value Ref Range Status   Specimen Description BLOOD RIGHT HAND  Final   Special Requests   Final    BOTTLES DRAWN AEROBIC AND ANAEROBIC Blood Culture adequate volume   Culture   Final    NO GROWTH < 24 HOURS Performed at [facility], [address] [place], [place] [address]    Report Status PENDING  Incomplete  Culture, blood (Routine X 2) w Reflex to ID Panel     Status: None (Preliminary result)   Collection Time: 12/15/20  6:09 PM   Specimen: BLOOD  Result Value Ref Range Status   Specimen Description BLOOD RIGHT ANTECUBITAL  Final   Special Requests   Final    BOTTLES DRAWN AEROBIC AND ANAEROBIC Blood Culture results may not be optimal due to an inadequate volume of blood received in culture bottles   Culture   Final    NO GROWTH < 24 HOURS Performed at [facility], [address] [place], [place] [address]    Report Status PENDING  Incomplete     Scheduled Meds: . amLODipine  5 mg Oral Daily  . budesonide (PULMICORT) nebulizer solution  0.5 mg Nebulization BID  . busPIRone  5 mg Oral TID  . enoxaparin (LOVENOX) injection  40 mg Subcutaneous Q24H  . folic acid  1 mg Oral Daily  . ipratropium-albuterol  3 mL Nebulization Q8H  . multivitamin with minerals  1 tablet Oral Daily  . rosuvastatin  5 mg Oral Daily  . thiamine  100 mg Oral  Daily   Or  . thiamine  100 mg Intravenous Daily   Continuous Infusions: . sodium chloride 75 mL/hr at 12/16/20 1249    Procedures/Studies: DG Lumbar Spine Complete  Result Date: 12/15/2020 CLINICAL DATA:  Sudden onset right lower back pain with urinary incontinence last night. No known injury. EXAM: LUMBAR SPINE - COMPLETE 4+ VIEW COMPARISON:  05/07/2020 FINDINGS: Five non-rib-bearing lumbar vertebrae. Stable mild and moderate lateral and anterior spur formation at multiple  levels of the lumbar and lower thoracic spine. No fractures or subluxations. No pars defects. Lower lumbosacral spine facet degenerative changes are again demonstrated. Atheromatous arterial calcifications without visible aneurysm. IMPRESSION: 1. No fracture or subluxation. 2. Stable degenerative changes. Electronically Signed   By: Beckie Salts M.D.   On: 12/15/2020 09:51   MR LUMBAR SPINE WO CONTRAST  Result Date: 12/15/2020 CLINICAL DATA:  Low back pain rule out cauda carina syndrome EXAM: MRI LUMBAR SPINE WITHOUT CONTRAST TECHNIQUE: Multiplanar, multisequence MR imaging of the lumbar spine was performed. No intravenous contrast was administered. COMPARISON:  Lumbar radiographs 12/15/2020 FINDINGS: Segmentation:  Normal Alignment:  Mild retrolisthesis L1-2 and L2-3 Vertebrae:  Negative for fracture or mass. Conus medullaris and cauda equina: Conus extends to the L1 level. Conus and cauda equina appear normal. Paraspinal and other soft tissues: Negative for paraspinous mass, adenopathy, or fluid collection. Disc levels: L1-2: Mild disc degeneration and disc bulging. Mild facet degeneration. Mild subarticular stenosis bilaterally L2-3: Disc degeneration with Schmorl's node and diffuse disc bulging. Bilateral facet hypertrophy. Moderate spinal stenosis L3-4: Disc degeneration with diffuse disc bulging. Shallow left-sided disc protrusion. Bilateral facet and ligamentum flavum hypertrophy. Mild to moderate spinal stenosis.  Moderate to severe subarticular and foraminal stenosis on the left. Mild subarticular stenosis on the right L4-5: Disc degeneration with disc bulging and diffuse endplate spurring. Moderate facet and ligamentum flavum hypertrophy. Severe spinal stenosis. Severe subarticular and foraminal stenosis bilaterally L5-S1: Disc degeneration with diffuse disc bulging with and endplate spurring. Advanced facet degeneration bilaterally. Moderate spinal stenosis. Severe subarticular and severe foraminal stenosis bilaterally left greater than right due to spurring. IMPRESSION: Multilevel degenerative change throughout the lumbar spine causing spinal and foraminal stenosis at multiple levels Severe spinal stenosis at L4-5 with severe subarticular and foraminal stenosis bilaterally. Electronically Signed   By: Marlan Palau M.D.   On: 12/15/2020 18:45   CT RENAL STONE STUDY  Result Date: 12/15/2020 CLINICAL DATA:  Flank pain, kidney stone suspected Right low back pain with urinary incontinence. EXAM: CT ABDOMEN AND PELVIS WITHOUT CONTRAST TECHNIQUE: Multidetector CT imaging of the abdomen and pelvis was performed following the standard protocol without IV contrast. COMPARISON:  None. FINDINGS: Lower chest: The lung bases are clear. No focal airspace disease or pleural effusion. The heart is normal in size. Small hiatal hernia with wall thickening of the distal esophagus. Hepatobiliary: Tiny subcapsular low-density lesion in the right lobe of the liver, series 2, image 11, incompletely characterized on noncontrast exam. Gallbladder physiologically distended, no calcified stone. No biliary dilatation. Pancreas: No ductal dilatation or inflammation. There is mild motion artifact through the distal body and tail which obscures detailed assessment. Spleen: Normal in size without focal abnormality. Adrenals/Urinary Tract: No adrenal nodule. Motion artifact through the level of the kidneys. No renal stones. No hydronephrosis. No  obvious perinephric edema allowing for motion. No focal renal abnormality is seen. Ureters are decompressed without stones along the course. Distended urinary bladder. No bladder wall thickening. No bladder stone. No urethral stones are seen. Stomach/Bowel: Small hiatal hernia with wall thickening of the distal esophagus. Fluid/ingested material within the stomach. There is no gastric wall thickening. Normal positioning of the duodenum and ligament of Treitz, motion artifact limits detailed assessment. No small bowel obstruction or obvious inflammation. High-density material in the appendix consistent with ingested contents are prior enteric contrast. No appendicitis. Slightly high-riding cecum in the right mid abdomen transverse and sigmoid colonic redundancy. No colonic wall thickening or inflammation. Vascular/Lymphatic: Moderately advanced aortic and  iliac atherosclerosis. No aneurysm. No bulky abdominopelvic adenopathy. Reproductive: Coarse prostatic calcifications within normal sized prostate gland. Other: No free air or ascites.  Fat in both inguinal canals. Musculoskeletal: Degenerative change of both sacroiliac joints. Vacuum phenomena L1-L2. Multilevel lumbar endplate spurring. Lower lumbar facet hypertrophy. No focal bone lesion or acute osseous abnormality. IMPRESSION: 1. No renal stones or obstructive uropathy. 2. Distended urinary bladder without wall thickening. 3. Small hiatal hernia with wall thickening of the distal esophagus, can be seen with reflux or esophagitis. Aortic Atherosclerosis (ICD10-I70.0). Electronically Signed   By: Narda Rutherford M.D.   On: 12/15/2020 16:21   DG HIP UNILAT WITH PELVIS 2-3 VIEWS RIGHT  Result Date: 12/15/2020 CLINICAL DATA:  Back pain right hip pain EXAM: DG HIP (WITH OR WITHOUT PELVIS) 2-3V RIGHT COMPARISON:  None. FINDINGS: There is no evidence of hip fracture or dislocation. There is no evidence of arthropathy or other focal bone abnormality. IMPRESSION:  Negative. Electronically Signed   By: Marlan Palau M.D.   On: 12/15/2020 18:32    Catarina Hartshorn, DO  [facility]  If 7PM-7AM, please contact night-coverage [email or web address] Password [email or web address] 12/16/2020, 2:09 PM   LOS: 1 day

## 2020-12-16 NOTE — Evaluation (Signed)
Physical Therapy Evaluation Patient Details Name: [name] MRN: [id number] DOB: Oct 16, 1956 Today's Date: 12/16/2020   History of Present Illness  He presents for evaluation of recurrent right lower back pain radiating to the right hip region, for several days.  Pain is worse with movement and ambulation.  Yesterday, while hurrying to the bathroom, he had a sensation that he would spontaneously urinate but was never incontinent.  He was having severe pain at the time.  He had similar pain about a year ago, when he was diagnosed with lumbar radiculopathy.  He does not have ongoing chronic back pain or radicular symptoms.  He denies stool incontinence.  He denies weakness or paresthesia of the legs.  He has not noticed decreased sensation between his legs around his genitals.  He denies trauma.  A family member brought him here for evaluation.  The patient's mother is currently hospitalized.  There are no other known modifying factors.    Clinical Impression  Patient limited for functional mobility as stated below secondary to BLE weakness, pain and impaired activity tolerance. Patient able to transition to seated EOB without assist. Upon seated, trialed repeated lumbar flexion with cueing for depth with minimal improvement in symptoms following. Patient tender to palpation in R QL, paraspinals, and glutes.  Patient transfers to standing with use of RW and min assist secondary to c/o low back/R hip pain and LE weakness. Patient ambulates with slow, labored cadence with use of RW without loss of balance. Patient returned to bed at end of session. Patient will benefit from continued physical therapy in hospital and recommended venue below to increase strength, balance, endurance for safe ADLs and gait.     Follow Up Recommendations Home health PT;Supervision for mobility/OOB    Equipment Recommendations  None recommended by PT    Recommendations for Other Services       Precautions /  Restrictions Precautions Precautions: Fall Restrictions Weight Bearing Restrictions: No      Mobility  Bed Mobility Overal bed mobility: Modified Independent             General bed mobility comments: slow, labored transition to seated EOB    Transfers Overall transfer level: Needs assistance Equipment used: Rolling walker (2 wheeled) Transfers: Sit to/from [employer] Sit to Stand: Min assist Stand pivot transfers: Min assist       General transfer comment: slow, labored transition to standing with RW, assist due to pain/LE weakness  Ambulation/Gait Ambulation/Gait assistance: Min guard Gait Distance (Feet): 50 Feet Assistive device: Rolling walker (2 wheeled) Gait Pattern/deviations: Step-through pattern;Decreased stride length Gait velocity: decreased   General Gait Details: slow cadence without loss of balance with use of RW  Stairs            Wheelchair Mobility    Modified Rankin (Stroke Patients Only)       Balance Overall balance assessment: Needs assistance Sitting-balance support: No upper extremity supported Sitting balance-Leahy Scale: Normal Sitting balance - Comments: seated EOB   Standing balance support: Bilateral upper extremity supported Standing balance-Leahy Scale: Fair Standing balance comment: with RW                             Pertinent Vitals/Pain Pain Assessment: Faces Faces Pain Scale: Hurts even more Pain Location: R hip/lumbar region Pain Descriptors / Indicators: Stabbing;Sharp Pain Intervention(s): Limited activity within patient's tolerance;Monitored during session;Repositioned    Home Living Family/patient expects to be discharged  to:: Private residence Living Arrangements: Other relatives;Parent Available Help at Discharge: Family Type of Home: House Home Access: Stairs to enter Entrance Stairs-Rails: Left Entrance Stairs-Number of Steps: 6 Home Layout: Two level Home  Equipment: Walker - 2 wheels;Cane - single point;Shower seat;Bedside commode;Wheelchair - manual      Prior Function Level of Independence: Independent         Comments: Patient states short distance community ambulator without AD, independent with ADL     Hand Dominance        Extremity/Trunk Assessment   Upper Extremity Assessment Upper Extremity Assessment: Overall WFL for tasks assessed    Lower Extremity Assessment Lower Extremity Assessment: Generalized weakness    Cervical / Trunk Assessment Cervical / Trunk Assessment: Normal  Communication   Communication: No difficulties  Cognition Arousal/Alertness: Awake/alert Behavior During Therapy: WFL for tasks assessed/performed Overall Cognitive Status: Within Functional Limits for tasks assessed                                        General Comments      Exercises Other Exercises Other Exercises: seated lumbar flexion x 10   Assessment/Plan    PT Assessment Patient needs continued PT services  PT Problem List Decreased strength;Decreased mobility;Decreased activity tolerance;Decreased range of motion;Pain       PT Treatment Interventions DME instruction;Therapeutic exercise;Gait training;Balance training;Stair training;Neuromuscular re-education;Functional mobility training;Therapeutic activities;Patient/family education    PT Goals (Current goals can be found in the Care Plan section)  Acute Rehab PT Goals Patient Stated Goal: Return home PT Goal Formulation: With patient Time For Goal Achievement: 12/30/20 Potential to Achieve Goals: Good    Frequency Min 3X/week   Barriers to discharge        Co-evaluation               AM-PAC PT "6 Clicks" Mobility  Outcome Measure Help needed turning from your back to your side while in a flat bed without using bedrails?: None Help needed moving from lying on your back to sitting on the side of a flat bed without using bedrails?:  None Help needed moving to and from a bed to a chair (including a wheelchair)?: A Little Help needed standing up from a chair using your arms (e.g., wheelchair or bedside chair)?: A Little Help needed to walk in hospital room?: A Little Help needed climbing 3-5 steps with a railing? : A Lot 6 Click Score: 19    End of Session Equipment Utilized During Treatment: Gait belt Activity Tolerance: Patient tolerated treatment well;Patient limited by pain Patient left: in bed;with call bell/phone within reach;with bed alarm set Nurse Communication: Mobility status PT Visit Diagnosis: Unsteadiness on feet (R26.81);Other abnormalities of gait and mobility (R26.89);Muscle weakness (generalized) (M62.81);Pain Pain - Right/Left: Right Pain - part of body: Hip (hip/ low back)    Time: 3790-2409 PT Time Calculation (min) (ACUTE ONLY): 20 min   Charges:   PT Evaluation $PT Eval Low Complexity: 1 Low PT Treatments $Therapeutic Activity: 8-22 mins        8:43 AM, 12/16/20 Wyman Songster PT, DPT Physical Therapist at [facility]

## 2020-12-16 NOTE — Care Management Important Message (Signed)
Important Message  Patient Details  Name: [name] MRN: [id number] Date of Birth: [birth year]   Medicare Important Message Given:  Yes     Corey Harold 12/16/2020, 2:23 PM

## 2020-12-16 NOTE — Plan of Care (Signed)
  Problem: Acute Rehab PT Goals(only PT should resolve) Goal: Patient Will Transfer Sit To/From Stand Outcome: Progressing Flowsheets (Taken 12/16/2020 0845) Patient will transfer sit to/from stand: with supervision Goal: Pt Will Transfer Bed To Chair/Chair To Bed Outcome: Progressing Flowsheets (Taken 12/16/2020 0845) Pt will Transfer Bed to Chair/Chair to Bed: with supervision Goal: Pt Will Ambulate Outcome: Progressing Flowsheets (Taken 12/16/2020 0845) Pt will Ambulate:  100 feet  with least restrictive assistive device  with supervision Goal: Pt/caregiver will Perform Home Exercise Program Outcome: Progressing Flowsheets (Taken 12/16/2020 0845) Pt/caregiver will Perform Home Exercise Program:  For increased strengthening  For increased ROM  For improved balance  Independently  8:45 AM, 12/16/20 Wyman Songster PT, DPT Physical Therapist at [facility]

## 2020-12-17 DIAGNOSIS — Z720 Tobacco use: Secondary | ICD-10-CM | POA: Diagnosis not present

## 2020-12-17 DIAGNOSIS — M48061 Spinal stenosis, lumbar region without neurogenic claudication: Secondary | ICD-10-CM | POA: Diagnosis not present

## 2020-12-17 DIAGNOSIS — F1020 Alcohol dependence, uncomplicated: Secondary | ICD-10-CM | POA: Diagnosis not present

## 2020-12-17 DIAGNOSIS — E871 Hypo-osmolality and hyponatremia: Secondary | ICD-10-CM | POA: Diagnosis not present

## 2020-12-17 LAB — CBC
HCT: 42.6 % (ref 39.0–52.0)
Hemoglobin: 13.7 g/dL (ref 13.0–17.0)
MCH: 28.8 pg (ref 26.0–34.0)
MCHC: 32.2 g/dL (ref 30.0–36.0)
MCV: 89.7 fL (ref 80.0–100.0)
Platelets: 243 10*3/uL (ref 150–400)
RBC: 4.75 MIL/uL (ref 4.22–5.81)
RDW: 12.6 % (ref 11.5–15.5)
WBC: 6.9 10*3/uL (ref 4.0–10.5)
nRBC: 0 % (ref 0.0–0.2)

## 2020-12-17 LAB — BASIC METABOLIC PANEL
Anion gap: 9 (ref 5–15)
BUN: 10 mg/dL (ref 8–23)
CO2: 22 mmol/L (ref 22–32)
Calcium: 9 mg/dL (ref 8.9–10.3)
Chloride: 100 mmol/L (ref 98–111)
Creatinine, Ser: 0.85 mg/dL (ref 0.61–1.24)
GFR, Estimated: >60 mL/min (ref >60)
Glucose, Bld: 113 mg/dL — ABNORMAL HIGH (ref 70–99)
Potassium: 4 mmol/L (ref 3.5–5.1)
Sodium: 131 mmol/L — ABNORMAL LOW (ref 135–145)

## 2020-12-17 LAB — URINE CULTURE

## 2020-12-17 MED ORDER — PREDNISONE 10 MG PO TABS: 60.0000 mg | ORAL_TABLET | Freq: Every day | ORAL | 0 refills | Status: DC

## 2020-12-17 MED ORDER — HYDROCODONE-ACETAMINOPHEN 5-325 MG PO TABS: 1.0000 | ORAL_TABLET | Freq: Four times a day (QID) | ORAL | 0 refills | Status: DC | PRN

## 2020-12-17 MED ORDER — METHOCARBAMOL 500 MG PO TABS: 500.0000 mg | ORAL_TABLET | Freq: Four times a day (QID) | ORAL | 0 refills | Status: DC | PRN

## 2020-12-17 NOTE — Progress Notes (Addendum)
Discharge instructions reviewed with patient, patient verbalized understanding of instructions. patient discharged home with family in stable condition.

## 2020-12-17 NOTE — Discharge Summary (Signed)
Physician Discharge Summary  [name] JOI:[id number] DOB: 23-Apr-1956 DOA: 12/15/2020  PCP: Kerri Perches, MD  Admit date: 12/15/2020 Discharge date: 12/17/2020  Admitted From: Home Disposition:  Home   Recommendations for Outpatient Follow-up:  1. Follow up with PCP in 1-2 weeks 2. Please obtain BMP/CBC in one week   Home Health: YES Equipment/Devices: HHPT  Discharge Condition: Stable CODE STATUS: DNR Diet recommendation: Heart Healthy   Brief/Interim Summary: 65 y.o.malewith medical history ofCOPD, hypertension, alcohol dependence, anxiety, and impaired glucose tolerance presenting with back pain and right flank pain. The patient states that he developed "a bone spur on the right side"that resulted in pain on his right flank and right hip region that began on the morning of 12/14/2020 when he was walking to his mailbox. He he initially felt that this may have been related to his heavy lifting that he recently did in the past 2 weeks. However, he continued to have worsening pain and difficulty with ambulation. As result, the patient presented for further evaluation. He denies any recent injury or falls. Unfortunately, the patient is a difficult historian. He is frequently tangential and requires repeated redirection. Nevertheless, the patient denies any saddle anesthesia type symptoms. He denies any bowel bladder incontinence. He denies any frank radicular type symptoms from his back. He denies any illicit drug use, but states that he drinks 6-8 beers on Saturdays and Sundays. He denies drinking any alcohol during the week. He has approximately 40-pack-year history of tobacco. He has not had any frank dysuria or hematuria. He denies any headache, fevers, chills, chest pain, shortness breath, cough, hemoptysis, nausea, vomiting, diarrhea, anterior abdominal pain.  In the emergency department, the patient had a low-grade temperature of 99.4 F,but he was  hemodynamically stable. Oxygen saturation was 94-96% on room air. WBC 8.6. Otherwise CBC was unremarkable. Sodium was 124, potassium 4.2, serum creatinine 0.92. X-ray of the lumbar spine was negative for any fractures or dislocations. The patient continued to have difficulty getting up and ambulating secondary to pain in his right hip and right flank area. 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 and poor solute intake --UA--no pyuria -FeNa--0.08% -Serum osmolarity--267 -Urine osmolarity--449 -TSH--2.648 -Holding Maxide--will not restart-->follow up with PCP to titrate anti-HTN meds -Continued normal saline during hospitalization -Na 131 on day of d/c  Right flank pain/right hip pain/Back pain/Severe lumbar spinal stenosis -CT renal stone protocol--neg for renal stone; distal esophageal wall thickening -Plain x-ray of Lumbar spine--DJD -MRI- Lumbar spine--severe spinal stenosis L4-5 with severe subarticular and foraminal stenosis bilateral -Blood cultures x2 sets--neg to date -Check lactic acid-1.8 -Urinalysis with microscopy--no pyuria -PT eval-->HHPT -case discussed with neurosurgery, Dr. Dawley-->continue nonoperative management and follow up in office -started prednisone and robaxin with significant improvement in pain -d/c home with prednisone taper  Alcohol dependence -Alcohol withdrawal protocol  Tobacco abuse/COPD -Stable on room air -Continue duo nebs -Continue Pulmicort during hospitalization  THC use -cessation discussed  Essential hypertension -Restart amlodipine  Anxiety -Continue BuSpar home dose  Hyperlipidemia -Continue Crestor  Alcoholic peripheral neuropathy -Check serum B12--401 -Check folic acid--17.5 -TSH--2.648  Impaired glucose  tolerance? -Hemoglobin A1c--4.8   Discharge Instructions   Allergies as of 12/17/2020      Reactions   Lisinopril Swelling, Other (See Comments)   Reaction:  Angioedema  Medication List    STOP taking these medications   GaviLyte-G 236 g solution Generic drug: polyethylene glycol   ondansetron 4 MG tablet Commonly known as: Zofran   triamterene-hydrochlorothiazide 37.5-25 MG tablet Commonly known as: MAXZIDE-25     TAKE these medications   albuterol 108 (90 Base) MCG/ACT inhaler Commonly known as: VENTOLIN HFA Inhale 2 puffs into the lungs every 6 (six) hours as needed for wheezing or shortness of breath.   amLODipine 5 MG tablet Commonly known as: NORVASC Take 1 tablet (5 mg total) by mouth daily.   busPIRone 5 MG tablet Commonly known as: BUSPAR Take 1 tablet (5 mg total) by mouth 3 (three) times daily.   diphenoxylate-atropine 2.5-0.025 MG tablet Commonly known as: Lomotil Take 1 tablet by mouth 4 (four) times daily as needed for diarrhea or loose stools.   fluticasone 50 MCG/BLIST diskus inhaler Commonly known as: FLOVENT DISKUS Inhale 1 puff into the lungs 2 (two) times daily.   HYDROcodone-acetaminophen 5-325 MG tablet Commonly known as: NORCO/VICODIN Take 1 tablet by mouth every 6 (six) hours as needed for moderate pain.   ipratropium-albuterol 0.5-2.5 (3) MG/3ML Soln Commonly known as: DUONEB Every 6 hours as needed. What changed:   how much to take  how to take this  when to take this  reasons to take this  additional instructions   methocarbamol 500 MG tablet Commonly known as: ROBAXIN Take 1 tablet (500 mg total) by mouth every 6 (six) hours as needed for muscle spasms.   predniSONE 10 MG tablet Commonly known as: DELTASONE Take 6 tablets (60 mg total) by mouth daily with breakfast. And decrease by one tablet daily Start taking on: December 18, 2020   rosuvastatin 5 MG tablet Commonly known as: CRESTOR Take 1 tablet (5 mg total)  by mouth daily.       Follow-up Information    Houpt, Minerva Areola, MD .   Specialty: Infectious Diseases Contact information: [address] [phone] [place] [address] [phone]        [facility] Follow up.   Why: HHPT  Contact information: [address] [place] [address] [phone]       Dawley, Troy C, DO Follow up in 2 week(s).   Contact information: [address] [place] [address] [place] [address] [phone]              Allergies  Allergen Reactions  . Lisinopril Swelling and Other (See Comments)    Reaction:  Angioedema    Consultations:  Neurosurgery telephone   Procedures/Studies: DG Lumbar Spine Complete  Result Date: 12/15/2020 CLINICAL DATA:  Sudden onset right lower back pain with urinary incontinence last night. No known injury. EXAM: LUMBAR SPINE - COMPLETE 4+ VIEW COMPARISON:  05/07/2020 FINDINGS: Five non-rib-bearing lumbar vertebrae. Stable mild and moderate lateral and anterior spur formation at multiple levels of the lumbar and lower thoracic spine. No fractures or subluxations. No pars defects. Lower lumbosacral spine facet degenerative changes are again demonstrated. Atheromatous arterial calcifications without visible aneurysm. IMPRESSION: 1. No fracture or subluxation. 2. Stable degenerative changes. Electronically Signed   By: Beckie Salts M.D.   On: 12/15/2020 09:51   MR LUMBAR SPINE WO CONTRAST  Result Date: 12/15/2020 CLINICAL DATA:  Low back pain rule out cauda carina syndrome EXAM: MRI LUMBAR SPINE WITHOUT CONTRAST TECHNIQUE: Multiplanar, multisequence MR imaging of the lumbar spine was performed. No intravenous contrast was administered. COMPARISON:  Lumbar radiographs 12/15/2020 FINDINGS: Segmentation:  Normal Alignment:  Mild retrolisthesis L1-2 and L2-3 Vertebrae:  Negative for fracture or mass. Conus medullaris and cauda equina: Conus extends to the L1 level. Conus and cauda  equina appear normal. Paraspinal and other soft tissues: Negative for paraspinous mass, adenopathy, or fluid collection. Disc levels: L1-2: Mild disc degeneration and disc bulging. Mild facet degeneration. Mild subarticular stenosis bilaterally L2-3: Disc degeneration with Schmorl's node and diffuse disc bulging. Bilateral facet hypertrophy. Moderate spinal stenosis L3-4: Disc degeneration with diffuse disc bulging. Shallow left-sided disc protrusion. Bilateral facet and ligamentum flavum hypertrophy. Mild to moderate spinal stenosis. Moderate to severe subarticular and foraminal stenosis on the left. Mild subarticular stenosis on the right L4-5: Disc degeneration with disc bulging and diffuse endplate spurring. Moderate facet and ligamentum flavum hypertrophy. Severe spinal stenosis. Severe subarticular and foraminal stenosis bilaterally L5-S1: Disc degeneration with diffuse disc bulging with and endplate spurring. Advanced facet degeneration bilaterally. Moderate spinal stenosis. Severe subarticular and severe foraminal stenosis bilaterally left greater than right due to spurring. IMPRESSION: Multilevel degenerative change throughout the lumbar spine causing spinal and foraminal stenosis at multiple levels Severe spinal stenosis at L4-5 with severe subarticular and foraminal stenosis bilaterally. Electronically Signed   By: Marlan Palauharles  Clark M.D.   On: 12/15/2020 18:45   CT RENAL STONE STUDY  Result Date: 12/15/2020 CLINICAL DATA:  Flank pain, kidney stone suspected Right low back pain with urinary incontinence. EXAM: CT ABDOMEN AND PELVIS WITHOUT CONTRAST TECHNIQUE: Multidetector CT imaging of the abdomen and pelvis was performed following the standard protocol without IV contrast. COMPARISON:  None. FINDINGS: Lower chest: The lung bases are clear. No focal airspace disease or pleural effusion. The heart is normal in size. Small hiatal hernia with wall thickening of the distal esophagus. Hepatobiliary: Tiny  subcapsular low-density lesion in the right lobe of the liver, series 2, image 11, incompletely characterized on noncontrast exam. Gallbladder physiologically distended, no calcified stone. No biliary dilatation. Pancreas: No ductal dilatation or inflammation. There is mild motion artifact through the distal body and tail which obscures detailed assessment. Spleen: Normal in size without focal abnormality. Adrenals/Urinary Tract: No adrenal nodule. Motion artifact through the level of the kidneys. No renal stones. No hydronephrosis. No obvious perinephric edema allowing for motion. No focal renal abnormality is seen. Ureters are decompressed without stones along the course. Distended urinary bladder. No bladder wall thickening. No bladder stone. No urethral stones are seen. Stomach/Bowel: Small hiatal hernia with wall thickening of the distal esophagus. Fluid/ingested material within the stomach. There is no gastric wall thickening. Normal positioning of the duodenum and ligament of Treitz, motion artifact limits detailed assessment. No small bowel obstruction or obvious inflammation. High-density material in the appendix consistent with ingested contents are prior enteric contrast. No appendicitis. Slightly high-riding cecum in the right mid abdomen transverse and sigmoid colonic redundancy. No colonic wall thickening or inflammation. Vascular/Lymphatic: Moderately advanced aortic and iliac atherosclerosis. No aneurysm. No bulky abdominopelvic adenopathy. Reproductive: Coarse prostatic calcifications within normal sized prostate gland. Other: No free air or ascites.  Fat in both inguinal canals. Musculoskeletal: Degenerative change of both sacroiliac joints. Vacuum phenomena L1-L2. Multilevel lumbar endplate spurring. Lower lumbar facet hypertrophy. No focal bone lesion or acute osseous abnormality. IMPRESSION: 1. No renal stones or obstructive uropathy. 2. Distended urinary bladder without wall thickening. 3.  Small hiatal hernia with wall thickening of the distal esophagus, can be seen with reflux or esophagitis. Aortic Atherosclerosis (ICD10-I70.0). Electronically Signed   By: Narda RutherfordMelanie  Sanford M.D.   On: 12/15/2020 16:21  DG HIP UNILAT WITH PELVIS 2-3 VIEWS RIGHT  Result Date: 12/15/2020 CLINICAL DATA:  Back pain right hip pain EXAM: DG HIP (WITH OR WITHOUT PELVIS) 2-3V RIGHT COMPARISON:  None. FINDINGS: There is no evidence of hip fracture or dislocation. There is no evidence of arthropathy or other focal bone abnormality. IMPRESSION: Negative. Electronically Signed   By: Marlan Palau M.D.   On: 12/15/2020 18:32        Discharge Exam: Vitals:   12/17/20 0830 12/17/20 0842  BP:    Pulse:    Resp:    Temp:    SpO2: 99% 100%   Vitals:   12/16/20 2130 12/17/20 0643 12/17/20 0830 12/17/20 0842  BP: (!) 149/107 137/89    Pulse: 83 96    Resp: 18 18    Temp: 99.5 F (37.5 C) 98 F (36.7 C)    TempSrc: Oral Oral    SpO2: 100% 100% 99% 100%  Weight:      Height:        General: Pt is alert, awake, not in acute distress Cardiovascular: RRR, S1/S2 +, no rubs, no gallops Respiratory: bibasilar rales. No wheeze Abdominal: Soft, NT, ND, bowel sounds + Extremities: no edema, no cyanosis   The results of significant diagnostics from this hospitalization (including imaging, microbiology, ancillary and laboratory) are listed below for reference.    Significant Diagnostic Studies: DG Lumbar Spine Complete  Result Date: 12/15/2020 CLINICAL DATA:  Sudden onset right lower back pain with urinary incontinence last night. No known injury. EXAM: LUMBAR SPINE - COMPLETE 4+ VIEW COMPARISON:  05/07/2020 FINDINGS: Five non-rib-bearing lumbar vertebrae. Stable mild and moderate lateral and anterior spur formation at multiple levels of the lumbar and lower thoracic spine. No fractures or subluxations. No pars defects. Lower lumbosacral spine facet degenerative changes are again demonstrated.  Atheromatous arterial calcifications without visible aneurysm. IMPRESSION: 1. No fracture or subluxation. 2. Stable degenerative changes. Electronically Signed   By: Beckie Salts M.D.   On: 12/15/2020 09:51   MR LUMBAR SPINE WO CONTRAST  Result Date: 12/15/2020 CLINICAL DATA:  Low back pain rule out cauda carina syndrome EXAM: MRI LUMBAR SPINE WITHOUT CONTRAST TECHNIQUE: Multiplanar, multisequence MR imaging of the lumbar spine was performed. No intravenous contrast was administered. COMPARISON:  Lumbar radiographs 12/15/2020 FINDINGS: Segmentation:  Normal Alignment:  Mild retrolisthesis L1-2 and L2-3 Vertebrae:  Negative for fracture or mass. Conus medullaris and cauda equina: Conus extends to the L1 level. Conus and cauda equina appear normal. Paraspinal and other soft tissues: Negative for paraspinous mass, adenopathy, or fluid collection. Disc levels: L1-2: Mild disc degeneration and disc bulging. Mild facet degeneration. Mild subarticular stenosis bilaterally L2-3: Disc degeneration with Schmorl's node and diffuse disc bulging. Bilateral facet hypertrophy. Moderate spinal stenosis L3-4: Disc degeneration with diffuse disc bulging. Shallow left-sided disc protrusion. Bilateral facet and ligamentum flavum hypertrophy. Mild to moderate spinal stenosis. Moderate to severe subarticular and foraminal stenosis on the left. Mild subarticular stenosis on the right L4-5: Disc degeneration with disc bulging and diffuse endplate spurring. Moderate facet and ligamentum flavum hypertrophy. Severe spinal stenosis. Severe subarticular and foraminal stenosis bilaterally L5-S1: Disc degeneration with diffuse disc bulging with and endplate spurring. Advanced facet degeneration bilaterally. Moderate spinal stenosis. Severe subarticular and severe foraminal stenosis bilaterally left greater than right due to spurring. IMPRESSION: Multilevel degenerative change throughout the lumbar spine causing spinal and foraminal stenosis  at multiple levels Severe spinal stenosis at L4-5 with severe subarticular and foraminal stenosis bilaterally. Electronically Signed  By: Marlan Palau M.D.   On: 12/15/2020 18:45   CT RENAL STONE STUDY  Result Date: 12/15/2020 CLINICAL DATA:  Flank pain, kidney stone suspected Right low back pain with urinary incontinence. EXAM: CT ABDOMEN AND PELVIS WITHOUT CONTRAST TECHNIQUE: Multidetector CT imaging of the abdomen and pelvis was performed following the standard protocol without IV contrast. COMPARISON:  None. FINDINGS: Lower chest: The lung bases are clear. No focal airspace disease or pleural effusion. The heart is normal in size. Small hiatal hernia with wall thickening of the distal esophagus. Hepatobiliary: Tiny subcapsular low-density lesion in the right lobe of the liver, series 2, image 11, incompletely characterized on noncontrast exam. Gallbladder physiologically distended, no calcified stone. No biliary dilatation. Pancreas: No ductal dilatation or inflammation. There is mild motion artifact through the distal body and tail which obscures detailed assessment. Spleen: Normal in size without focal abnormality. Adrenals/Urinary Tract: No adrenal nodule. Motion artifact through the level of the kidneys. No renal stones. No hydronephrosis. No obvious perinephric edema allowing for motion. No focal renal abnormality is seen. Ureters are decompressed without stones along the course. Distended urinary bladder. No bladder wall thickening. No bladder stone. No urethral stones are seen. Stomach/Bowel: Small hiatal hernia with wall thickening of the distal esophagus. Fluid/ingested material within the stomach. There is no gastric wall thickening. Normal positioning of the duodenum and ligament of Treitz, motion artifact limits detailed assessment. No small bowel obstruction or obvious inflammation. High-density material in the appendix consistent with ingested contents are prior enteric contrast. No  appendicitis. Slightly high-riding cecum in the right mid abdomen transverse and sigmoid colonic redundancy. No colonic wall thickening or inflammation. Vascular/Lymphatic: Moderately advanced aortic and iliac atherosclerosis. No aneurysm. No bulky abdominopelvic adenopathy. Reproductive: Coarse prostatic calcifications within normal sized prostate gland. Other: No free air or ascites.  Fat in both inguinal canals. Musculoskeletal: Degenerative change of both sacroiliac joints. Vacuum phenomena L1-L2. Multilevel lumbar endplate spurring. Lower lumbar facet hypertrophy. No focal bone lesion or acute osseous abnormality. IMPRESSION: 1. No renal stones or obstructive uropathy. 2. Distended urinary bladder without wall thickening. 3. Small hiatal hernia with wall thickening of the distal esophagus, can be seen with reflux or esophagitis. Aortic Atherosclerosis (ICD10-I70.0). Electronically Signed   By: Narda Rutherford M.D.   On: 12/15/2020 16:21   DG HIP UNILAT WITH PELVIS 2-3 VIEWS RIGHT  Result Date: 12/15/2020 CLINICAL DATA:  Back pain right hip pain EXAM: DG HIP (WITH OR WITHOUT PELVIS) 2-3V RIGHT COMPARISON:  None. FINDINGS: There is no evidence of hip fracture or dislocation. There is no evidence of arthropathy or other focal bone abnormality. IMPRESSION: Negative. Electronically Signed   By: Marlan Palau M.D.   On: 12/15/2020 18:32     Microbiology: Recent Results (from the past 240 hour(s))  SARS CORONAVIRUS 2 (Tynisha Ogan 6-24 HRS) Nasopharyngeal Nasopharyngeal Swab     Status: None   Collection Time: 12/15/20 12:03 PM   Specimen: Nasopharyngeal Swab  Result Value Ref Range Status   SARS Coronavirus 2 NEGATIVE NEGATIVE Final    Comment: (NOTE) SARS-CoV-2 target nucleic acids are NOT DETECTED.  The SARS-CoV-2 RNA is generally detectable in upper and lower respiratory specimens during the acute phase of infection. Negative results do not preclude SARS-CoV-2 infection, do not rule  out co-infections with other pathogens, and should not be used as the sole basis for treatment or other patient management decisions. Negative results must be combined with clinical observations, patient history, and epidemiological information. The expected result is Negative.  Fact Sheet for Patients: SugarRoll.be  Fact Sheet for Healthcare Providers: https://www.woods-mathews.com/  This test is not yet approved or cleared by the [place] FDA and  has been authorized for detection and/or diagnosis of SARS-CoV-2 by FDA under an Emergency Use Authorization (EUA). This EUA will remain  in effect (meaning this test can be used) for the duration of the COVID-19 declaration under Se ction 564(b)(1) of the Act, 21 U.S.C. section 360bbb-3(b)(1), unless the authorization is terminated or revoked sooner.  Performed at [facility], [place] [address], [place], [place] [address]   Culture, Urine     Status: Abnormal   Collection Time: 12/15/20  3:39 PM   Specimen: Urine, Clean Catch  Result Value Ref Range Status   Specimen Description   Final    URINE, CLEAN CATCH Performed at [facility], [address], [place], [place] [address]    Special Requests   Final    NONE Performed at [facility], [address], [place], [place] [address]    Culture MULTIPLE SPECIES PRESENT, SUGGEST RECOLLECTION (A)  Final   Report Status 12/17/2020 FINAL  Final  Culture, blood (Routine X 2) w Reflex to ID Panel     Status: None (Preliminary result)   Collection Time: 12/15/20  6:09 PM   Specimen: BLOOD RIGHT HAND  Result Value Ref Range Status   Specimen Description BLOOD RIGHT HAND  Final   Special Requests   Final    BOTTLES DRAWN AEROBIC AND ANAEROBIC Blood Culture adequate volume   Culture   Final    NO GROWTH 2 DAYS Performed at [facility], [address], [place], [place] [address]    Report Status PENDING  Incomplete  Culture, blood  (Routine X 2) w Reflex to ID Panel     Status: None (Preliminary result)   Collection Time: 12/15/20  6:09 PM   Specimen: BLOOD  Result Value Ref Range Status   Specimen Description BLOOD RIGHT ANTECUBITAL  Final   Special Requests   Final    BOTTLES DRAWN AEROBIC AND ANAEROBIC Blood Culture results may not be optimal due to an inadequate volume of blood received in culture bottles   Culture   Final    NO GROWTH 2 DAYS Performed at [facility], [address] [place], [place] [address]    Report Status PENDING  Incomplete     Labs: Basic Metabolic Panel: Recent Labs  Lab 12/15/20 1202 12/15/20 1810 12/16/20 0459 12/17/20 0844  NA 124* 125* 128* 131*  K 4.2 4.3 3.7 4.0  CL 89* 89* 94* 100  CO2 25 25 23 22   GLUCOSE 122* 102* 92 113*  BUN 8 8 11 10   CREATININE 0.92 0.80 0.81 0.85  CALCIUM 8.7* 9.4 8.8* 9.0  MG  --  1.9 2.0  --   PHOS  --  3.9  --   --    Liver Function Tests: Recent Labs  Lab 12/15/20 1202 12/15/20 1810  AST 42* 39  ALT 52* 51*  ALKPHOS 62 65  BILITOT 1.8* 2.4*  PROT 7.4 7.8  ALBUMIN 4.1 4.3   No results for input(s): LIPASE, AMYLASE in the last 168 hours. No results for input(s): AMMONIA in the last 168 hours. CBC: Recent Labs  Lab 12/15/20 1202 12/15/20 1810 12/17/20 0623  WBC 8.6 7.4 6.9  NEUTROABS 4.9  --   --   HGB 14.8 16.4 13.7  HCT 43.7 49.6 42.6  MCV 86.5 88.4 89.7  PLT 233 101*  243   Cardiac Enzymes: No results for input(s): CKTOTAL, CKMB, CKMBINDEX, TROPONINI in the last 168 hours. BNP: Invalid input(s): POCBNP CBG: No results for input(s): GLUCAP in the last 168 hours.  Time coordinating discharge:  36 minutes  Signed:  Catarina Hartshornavid Dominque Levandowski, DO [facility] Pager: [phone] 12/17/2020, 9:46 AM

## 2020-12-19 ENCOUNTER — Telehealth: Payer: Self-pay

## 2020-12-19 NOTE — Telephone Encounter (Signed)
Transition Care Management Follow-up Telephone Call  Date of discharge and from where:   12/17/20  How have you been since you were released from the hospital? better  Any questions or concerns? No  Items Reviewed:  Did the pt receive and understand the discharge instructions provided? Yes   Medications obtained and verified? Yes   Other? No   Any new allergies since your discharge? No   Dietary orders reviewed? Yes  Do you have support at home? Yes   Home Care and Equipment/Supplies: Were home health services ordered? yes If so, what is the name of the agency? [facility]  Has the agency set up a time to come to the patient's home? yes Were any new equipment or medical supplies ordered?  No What is the name of the medical supply agency? n/a Were you able to get the supplies/equipment? not applicable Do you have any questions related to the use of the equipment or supplies? No  Functional Questionnaire: (I = Independent and D = Dependent) ADLs: i  Bathing/Dressing- i  Meal Prep- i  Eating- i  Maintaining continence- i  Transferring/Ambulation- i  Managing Meds- i  Follow up appointments reviewed:   PCP Hospital f/u appt confirmed? Yes  Scheduled to see Tereasa Coop on 12/29/20 @ 920.  Specialist Hospital f/u appt confirmed? No  Scheduled to see  on   Are transportation arrangements needed? No    If their condition worsens, is the pt aware to call PCP or go to the Emergency Dept.? Yes  Was the patient provided with contact information for the PCP's office or ED? Yes  Was to pt encouraged to call back with questions or concerns? Yes

## 2020-12-20 ENCOUNTER — Other Ambulatory Visit (INDEPENDENT_AMBULATORY_CARE_PROVIDER_SITE_OTHER): Payer: Self-pay

## 2020-12-20 ENCOUNTER — Telehealth (INDEPENDENT_AMBULATORY_CARE_PROVIDER_SITE_OTHER): Payer: Self-pay

## 2020-12-20 MED ORDER — PEG 3350-KCL-NA BICARB-NACL 420 G PO SOLR: 4000.0000 mL | ORAL | 0 refills | Status: DC

## 2020-12-20 NOTE — Telephone Encounter (Signed)
[name], CMA  

## 2020-12-21 ENCOUNTER — Encounter (INDEPENDENT_AMBULATORY_CARE_PROVIDER_SITE_OTHER): Payer: Self-pay

## 2020-12-21 LAB — CULTURE, BLOOD (ROUTINE X 2)
Culture: NO GROWTH
Culture: NO GROWTH
Special Requests: ADEQUATE

## 2020-12-21 NOTE — Progress Notes (Signed)
[name] is scheduled again on Friday 01/06/21 and he is aware, he was scheduled before and he canceled due to not going to pick up his prep and following through with it

## 2020-12-21 NOTE — Progress Notes (Signed)
[name] is scheduled on 01/06/21 and he is aware

## 2020-12-29 ENCOUNTER — Inpatient Hospital Stay: Payer: Medicare Other | Admitting: Nurse Practitioner

## 2021-01-04 NOTE — Patient Instructions (Signed)
[name]  01/04/2021     @PREFPERIOPPHARMACY @   Your procedure is scheduled on  Friday, January 06 2021 .  Report to [birth year]-1997  at  1130  A.M., and check in daysurgery.    Call this number if you have problems the morning of surgery:  [phone]                                 Follow the diet and prep instructions given to you by the office.    Take these medicines the morning of surgery with A SIP OF WATER                Amlodipine, buspar.  Robaxin and hydrodcodone  (if needed)    Do not wear jewelry, make-up or nail polish.  Do not wear lotions, powders, or perfumes, or deodorant.  Do not shave 48 hours prior to surgery.  Men may shave face and neck.  Do not bring valuables to the hospital.  [facility] is not responsible for any belongings or valuables.             Please brush your teeth before you come.  Contacts, dentures or bridgework may not be worn into surgery.  Leave your suitcase in the car.  After surgery it may be brought to your room.  For patients admitted to the hospital, discharge time will be determined by your treatment team.  Patients discharged the day of surgery will not be allowed to drive home and need someone to stay with them for 24 hours.   Special instructions:  DO NOT smoke(tobacco/vaping) or drink alcohol 24 hours prior to your procedure.  Please read over the following fact sheets that you were given. Anesthesia Post-op Instructions and Care and Recovery After Surgery       Colonoscopy, Adult, Care After This sheet gives you information about how to care for yourself after your procedure. Your health care provider may also give you more specific instructions. If you have problems or questions, contact your health care provider. What can I expect after the procedure? After the procedure, it is common to have:  A small amount of blood in your stool for 24 hours after the procedure.  Some gas.  Mild cramping or  bloating of your abdomen. Follow these instructions at home: Eating and drinking  Drink enough fluid to keep your urine pale yellow.  Follow instructions from your health care provider about eating or drinking restrictions.  Resume your normal diet as instructed by your health care provider. Avoid heavy or fried foods that are hard to digest.   Activity  Rest as told by your health care provider.  Avoid sitting for a long time without moving. Get up to take short walks every 1-2 hours. This is important to improve blood flow and breathing. Ask for help if you feel weak or unsteady.  Return to your normal activities as told by your health care provider. Ask your health care provider what activities are safe for you. Managing cramping and bloating  Try walking around when you have cramps or feel bloated.  Apply heat to your abdomen as told by your health care provider. Use the heat source that your health care provider recommends, such as a moist heat pack or a heating pad. ? Place a towel between your skin and the heat source. ? Leave the heat  on for 20-30 minutes. ? Remove the heat if your skin turns bright red. This is especially important if you are unable to feel pain, heat, or cold. You may have a greater risk of getting burned.   General instructions  If you were given a sedative during the procedure, it can affect you for several hours. Do not drive or operate machinery until your health care provider says that it is safe.  For the first 24 hours after the procedure: ? Do not sign important documents. ? Do not drink alcohol. ? Do your regular daily activities at a slower pace than normal. ? Eat soft foods that are easy to digest.  Take over-the-counter and prescription medicines only as told by your health care provider.  Keep all follow-up visits as told by your health care provider. This is important. Contact a health care provider if:  You have blood in your stool 2-3  days after the procedure. Get help right away if you have:  More than a small spotting of blood in your stool.  Large blood clots in your stool.  Swelling of your abdomen.  Nausea or vomiting.  A fever.  Increasing pain in your abdomen that is not relieved with medicine. Summary  After the procedure, it is common to have a small amount of blood in your stool. You may also have mild cramping and bloating of your abdomen.  If you were given a sedative during the procedure, it can affect you for several hours. Do not drive or operate machinery until your health care provider says that it is safe.  Get help right away if you have a lot of blood in your stool, nausea or vomiting, a fever, or increased pain in your abdomen. This information is not intended to replace advice given to you by your health care provider. Make sure you discuss any questions you have with your health care provider. Document Revised: 11/27/2019 Document Reviewed: 06/29/2019 Elsevier Patient Education  2021 Elsevier Inc. Monitored Anesthesia Care, Care After This sheet gives you information about how to care for yourself after your procedure. Your health care provider may also give you more specific instructions. If you have problems or questions, contact your health care provider. What can I expect after the procedure? After the procedure, it is common to have:  Tiredness.  Forgetfulness about what happened after the procedure.  Impaired judgment for important decisions.  Nausea or vomiting.  Some difficulty with balance. Follow these instructions at home: For the time period you were told by your health care provider:  Rest as needed.  Do not participate in activities where you could fall or become injured.  Do not drive or use machinery.  Do not drink alcohol.  Do not take sleeping pills or medicines that cause drowsiness.  Do not make important decisions or sign legal documents.  Do not  take care of children on your own.      Eating and drinking  Follow the diet that is recommended by your health care provider.  Drink enough fluid to keep your urine pale yellow.  If you vomit: ? Drink water, juice, or soup when you can drink without vomiting. ? Make sure you have little or no nausea before eating solid foods. General instructions  Have a responsible adult stay with you for the time you are told. It is important to have someone help care for you until you are awake and alert.  Take over-the-counter and prescription medicines only as  told by your health care provider.  If you have sleep apnea, surgery and certain medicines can increase your risk for breathing problems. Follow instructions from your health care provider about wearing your sleep device: ? Anytime you are sleeping, including during daytime naps. ? While taking prescription pain medicines, sleeping medicines, or medicines that make you drowsy.  Avoid smoking.  Keep all follow-up visits as told by your health care provider. This is important. Contact a health care provider if:  You keep feeling nauseous or you keep vomiting.  You feel light-headed.  You are still sleepy or having trouble with balance after 24 hours.  You develop a rash.  You have a fever.  You have redness or swelling around the IV site. Get help right away if:  You have trouble breathing.  You have new-onset confusion at home. Summary  For several hours after your procedure, you may feel tired. You may also be forgetful and have poor judgment.  Have a responsible adult stay with you for the time you are told. It is important to have someone help care for you until you are awake and alert.  Rest as told. Do not drive or operate machinery. Do not drink alcohol or take sleeping pills.  Get help right away if you have trouble breathing, or if you suddenly become confused. This information is not intended to replace advice  given to you by your health care provider. Make sure you discuss any questions you have with your health care provider. Document Revised: 08/18/2020 Document Reviewed: 11/05/2019 Elsevier Patient Education  2021 [employer].

## 2021-01-05 ENCOUNTER — Other Ambulatory Visit: Payer: Self-pay

## 2021-01-05 ENCOUNTER — Encounter (HOSPITAL_COMMUNITY): Payer: Self-pay | Admitting: Anesthesiology

## 2021-01-05 ENCOUNTER — Other Ambulatory Visit (HOSPITAL_COMMUNITY)
Admission: RE | Admit: 2021-01-05 | Discharge: 2021-01-05 | Disposition: A | Discharge status: Home / Self Care | Payer: Medicare Other | Source: Ambulatory Visit | Attending: Gastroenterology | Admitting: Gastroenterology

## 2021-01-05 ENCOUNTER — Encounter (HOSPITAL_COMMUNITY)
Admission: RE | Admit: 2021-01-05 | Discharge: 2021-01-05 | Disposition: A | Discharge status: Home / Self Care | Payer: Medicare Other | Source: Ambulatory Visit | Attending: Gastroenterology | Admitting: Gastroenterology

## 2021-01-06 ENCOUNTER — Encounter (HOSPITAL_COMMUNITY): Admission: RE | Payer: Self-pay | Source: Home / Self Care

## 2021-01-06 ENCOUNTER — Other Ambulatory Visit: Payer: Self-pay | Admitting: *Deleted

## 2021-01-06 ENCOUNTER — Ambulatory Visit (HOSPITAL_COMMUNITY): Admission: RE | Admit: 2021-01-06 | Payer: Medicare Other | Source: Home / Self Care | Admitting: Gastroenterology

## 2021-01-06 SURGERY — COLONOSCOPY WITH PROPOFOL
Anesthesia: Monitor Anesthesia Care

## 2021-01-06 NOTE — Patient Outreach (Signed)
[facility]) Care Management  01/06/2021  [name] [birth year] [id number]   [facility] Unsuccessful outreach  [name] as referred to [facility] on 09/04/19 for MD referral from Dr [name] Referral Reason:pt has medicare but can't afford medications does not have drug coverage- has emphysema -Resolved medicare coverage issue in October 2020 Insurance: Next[employer] Medicare/medicaid of [place] family planningeffective 11/7/2018SSI disability [employer] medicare effective 10/06/19  Admissions/ ED visits 12/15/20 -12/17/20 admission for Hyponatremia, right flank pain/right hip pain/back pain/sever lumbar spinal stenosis/alcohol dependence/THC use/Tobacco abuse/COPD/alcoholic peripheral neuropathy   Outreach attempt to the home number  No answer. [facility] RN CM left HIPAA [facility] Portability and Accountability Act) compliant voicemail message along with CMs contact info.   Plan: [facility] RN CM scheduled this patient for another call attempt within 4-7 business days  Mykaylah Ballman L. Noelle Penner, RN, BSN, CCM [facility] Telephonic Care Management Care Coordinator Office number [phone] Mobile number [phone]  Main [facility] number [phone] Fax number [phone]

## 2021-01-09 ENCOUNTER — Ambulatory Visit: Payer: Medicare Other | Admitting: Family Medicine

## 2021-01-11 ENCOUNTER — Ambulatory Visit: Payer: Medicare Other | Admitting: Family Medicine

## 2021-01-12 ENCOUNTER — Encounter: Payer: Self-pay | Admitting: *Deleted

## 2021-01-12 ENCOUNTER — Other Ambulatory Visit: Payer: Self-pay

## 2021-01-12 ENCOUNTER — Other Ambulatory Visit: Payer: Self-pay | Admitting: *Deleted

## 2021-01-12 NOTE — Patient Outreach (Signed)
[facility]) Care Management  01/12/2021  [name] February 19, 1956 [id number]  [facility] outreach to complex care patient [name] as referred to [facility] on 09/04/19 for MD referral from Dr [name] Referral Reason:pt has medicare but can't afford medications does not have drug coverage- has emphysema -Resolved medicare coverage issue in October 2020 Insurance: [employer] Medicare/medicaid of [place] family planningeffective 11/7/2018SSI disability [employer] medicare effective 10/06/19  Admissions/ ED visits 12/15/20 -12/17/20 admission for Hyponatremia, right flank pain/right hip pain/back pain/sever lumbar spinal stenosis/alcohol dependence/THC use/Tobacco abuse/COPD/alcoholic peripheral neuropathy   Follow up   He reports frustration today with his medical care  He was given time to ventilate Noted difficulty in several attempts to redirect pt back to topics during this outreach Depression screen admits to several days of frustration and depression but denies need for treatment and resources He states it is best to let "me cool off for a few days"  He states he continue to smoke "one to two cigarettes"  He reports his mother is home and is doing better He voiced concerns with recent death of friends Brother Eddie home from [place] to assist with his mother and patient His sister is present in the home. He reports assistance from sister was not successful related to a pending colonoscopy He voices anger of about his providers, having to be isolated during covid  Colonoscopy- He still has not had colonoscopy after scheduled twice Pt reports he has colonoscopy medications at home but reports difficulty with "getting the date right"  ED visits-He reports he agrees with his nephew that he had a slipped disc  He will follow up with orthopedic DM on  2/3/222 3 pm  He denies pain today  Medications He mentions his nephew (doctor, [place]) reviewed his medications  with him and "took me off what I did not need" [name] reports only taking 3 pills a day and has a bag of pill but with 2 attempts was unable to tell [facility] RN CM the medications he is taken nor the ones he discontinued  Appointments  Noted cancelled appointments in Epic for primary care provider (PCP) services  Pt encouraged to reschedule     Plans Patient agrees to care plan and follow up within the next 30 business days Pt encouraged to return a call to [facility] RN CM prn Routed note to MD  Nicholas Bradford L. Noelle Penner, RN, BSN, CCM [facility] Telephonic Care Management Care Coordinator Office number [phone] Main [facility] number [phone] Fax number [phone]

## 2021-01-13 DIAGNOSIS — E785 Hyperlipidemia, unspecified: Secondary | ICD-10-CM

## 2021-01-13 DIAGNOSIS — G621 Alcoholic polyneuropathy: Secondary | ICD-10-CM | POA: Diagnosis not present

## 2021-01-13 DIAGNOSIS — I2510 Atherosclerotic heart disease of native coronary artery without angina pectoris: Secondary | ICD-10-CM

## 2021-01-13 DIAGNOSIS — F17210 Nicotine dependence, cigarettes, uncomplicated: Secondary | ICD-10-CM

## 2021-01-13 DIAGNOSIS — R7302 Impaired glucose tolerance (oral): Secondary | ICD-10-CM

## 2021-01-13 DIAGNOSIS — M48061 Spinal stenosis, lumbar region without neurogenic claudication: Secondary | ICD-10-CM

## 2021-01-13 DIAGNOSIS — I10 Essential (primary) hypertension: Secondary | ICD-10-CM

## 2021-01-13 DIAGNOSIS — M47812 Spondylosis without myelopathy or radiculopathy, cervical region: Secondary | ICD-10-CM

## 2021-01-13 DIAGNOSIS — E871 Hypo-osmolality and hyponatremia: Secondary | ICD-10-CM

## 2021-01-13 DIAGNOSIS — F419 Anxiety disorder, unspecified: Secondary | ICD-10-CM

## 2021-01-13 DIAGNOSIS — J449 Chronic obstructive pulmonary disease, unspecified: Secondary | ICD-10-CM

## 2021-01-13 DIAGNOSIS — F10288 Alcohol dependence with other alcohol-induced disorder: Secondary | ICD-10-CM

## 2021-01-20 ENCOUNTER — Other Ambulatory Visit: Payer: Self-pay | Admitting: *Deleted

## 2021-01-20 ENCOUNTER — Telehealth: Payer: Self-pay | Admitting: *Deleted

## 2021-01-20 NOTE — Telephone Encounter (Signed)
Pt wanted to know what is going on with is colonoscopy he went this morning and they told him that he did not have an appointment. He wanted to know what was going on with this. Please advise

## 2021-01-20 NOTE — Patient Outreach (Signed)
[facility]) Care Management  01/20/2021  [name] [birth year] [id number]   [facility] incoming call from complex care patient  Nicholas [name] as referred to [facility] on 09/04/19 for MD referral from Dr [name] Referral Reason:pt has medicare but can't afford medications does not have drug coverage- has emphysema -Resolved medicare coverage issue in October 2020 Insurance: NextGen Medicare/medicaid of  family planningeffective 11/7/2018SSI disability [employer] medicare effective 10/06/19  Admissions/ ED visits 12/15/20 -12/17/20 admission for Hyponatremia, right flank pain/right hip pain/back pain/sever lumbar spinal stenosis/alcohol dependence/THC use/Tobacco abuse/COPD/alcoholic peripheral neuropathy  [name] outreached to [facility] RN CM  He reports his attempt to follow up for colonoscopy  He reports his brother took him to facility but he was informed the procedure was not on 01/20/21 He has returned home with the assistance of his brother    Plans [facility] RN CM will attempt to speak with pcp or gastroenterologist related to colonoscopy for this patient and follow up with him prn within next 30 business days  Ascencion Stegner L. Noelle Penner, RN, BSN, CCM [facility] Telephonic Care Management Care Coordinator Office number [phone] Main [facility] number [phone] Fax number [phone]

## 2021-01-20 NOTE — Telephone Encounter (Signed)
Pt called this am upset because he said he went to get his colonoscopy and was told he wasn't scheduled. I see where it was scheduled and cancelled 2 weeks ago. Pt states he knew nothing about what he was supposed to do/collect the prep, etc. Can someone reschedule him or reach out to him because he was very confused. Thanks

## 2021-01-23 NOTE — Telephone Encounter (Signed)
[name] has been scheduled 3 times for his colonoscopy and has received instructions all 3 times but he has noshowed all 3 times for his covid and preop appointments that he must have prior to his colonoscopy that he cant have without covid testing and preop he has been told all 3 times of dates and times but he dont keep it he noshows

## 2021-01-25 ENCOUNTER — Other Ambulatory Visit: Payer: Self-pay | Admitting: *Deleted

## 2021-01-25 ENCOUNTER — Telehealth: Payer: Self-pay

## 2021-01-25 ENCOUNTER — Telehealth (INDEPENDENT_AMBULATORY_CARE_PROVIDER_SITE_OTHER): Payer: Self-pay

## 2021-01-25 NOTE — Patient Outreach (Signed)
[facility]) Care Management  01/25/2021  [name] April 07, 1956 [id number]   [facility] Care coordination- Colonoscopy  [facility] RN CM received a return call from Crystal from [phone] of Dr Levon Hedger office related to the colonoscopy pt has been attempting to ger completed since October 2021  [facility] RN CM shared with her [name] barriers related to literacy and behavioral health Offered [facility] RN CM assistance attempting to coordinate for this needed procedure [facility] RN CM shared that [name] reports his brother [name] is not in the home and could assist with getting this procedure completed. Crystal to share this with the GI scheduler  [facility] RN CM left her office number for a return call for Scheduler   Plans [facility] RN CM will attempt to speak with pcp orgastroenterologistrelated to colonoscopy for this patient and follow up with him prn within next 30 business days  Joellyn Grandt L. Noelle Penner, RN, BSN, CCM [facility] Telephonic Care Management Care Coordinator Office number [phone] Mobile number [phone]  Main [facility] number [phone] Fax number [phone]

## 2021-01-25 NOTE — Telephone Encounter (Signed)
Kim with [facility] is reaching out to Dr Luther Parody Nurse

## 2021-01-25 NOTE — Telephone Encounter (Signed)
Left message

## 2021-01-25 NOTE — Telephone Encounter (Signed)
Kim with [facility] called today to make [place] aware that the patients brother [name] (lives with the patient) would be the best person to discuss patients care with to help the patient understand what needs to be done, as the patient is illiterate and has a history of mental health issues. I did discuss this with Marliss Czar ann here at the office whom had been trying for some time now to get this patient scheduled for a screening colonoscopy. She was given this information along with Kim's contact number at [facility] [address]

## 2021-01-25 NOTE — Patient Outreach (Signed)
[facility]) Care Management  01/25/2021  [name] 05-21-1956 [id number]   [facility] Care coordination- Colonoscopy  Review of Epic, noting pcp nurse outreach to gastroenterologist related to pt colonoscopy on 01/23/21 [facility] RN CM left a message at [phone] for nurse of Dr Levon Hedger  No answer. [facility] RN CM left HIPAA HIPAA [facility] Portability and Accountability Act) compliant voicemail message along with CM's contact info. Outreach to pcp office 336 [phone] left a message for pcp nurse No answer. [facility] RN CM left HIPAA HIPAA [facility] Portability and Accountability Act) compliant voicemail message along with CM's contact info.  Plans [facility] RN CM will attempt to speak with pcp or gastroenterologist related to colonoscopy for this patient and follow up with him prn within next 30 business days  Marbeth Smedley L. Noelle Penner, RN, BSN, CCM [facility] Telephonic Care Management Care Coordinator Office number [phone] Mobile number [phone]  Main [facility] number [phone] Fax number [phone]

## 2021-01-30 NOTE — Telephone Encounter (Signed)
Left message

## 2021-02-02 NOTE — Telephone Encounter (Signed)
Left message

## 2021-02-09 NOTE — Telephone Encounter (Signed)
Unable to leave message. Voicemail would not pick up.

## 2021-02-11 ENCOUNTER — Other Ambulatory Visit: Payer: Self-pay

## 2021-02-11 ENCOUNTER — Emergency Department (HOSPITAL_COMMUNITY)
Admission: EM | Admit: 2021-02-11 | Discharge: 2021-02-12 | Disposition: A | Discharge status: Home / Self Care | Payer: Medicare Other | Attending: Emergency Medicine | Admitting: Emergency Medicine

## 2021-02-11 ENCOUNTER — Emergency Department (HOSPITAL_COMMUNITY): Payer: Medicare Other

## 2021-02-11 ENCOUNTER — Encounter (HOSPITAL_COMMUNITY): Payer: Self-pay | Admitting: Emergency Medicine

## 2021-02-11 DIAGNOSIS — R000 Tachycardia, unspecified: Secondary | ICD-10-CM | POA: Diagnosis not present

## 2021-02-11 DIAGNOSIS — Z7951 Long term (current) use of inhaled steroids: Secondary | ICD-10-CM | POA: Insufficient documentation

## 2021-02-11 DIAGNOSIS — Z20822 Contact with and (suspected) exposure to covid-19: Secondary | ICD-10-CM | POA: Insufficient documentation

## 2021-02-11 DIAGNOSIS — J45909 Unspecified asthma, uncomplicated: Secondary | ICD-10-CM | POA: Diagnosis not present

## 2021-02-11 DIAGNOSIS — R0602 Shortness of breath: Secondary | ICD-10-CM

## 2021-02-11 DIAGNOSIS — F17210 Nicotine dependence, cigarettes, uncomplicated: Secondary | ICD-10-CM | POA: Insufficient documentation

## 2021-02-11 DIAGNOSIS — Z79899 Other long term (current) drug therapy: Secondary | ICD-10-CM | POA: Diagnosis not present

## 2021-02-11 DIAGNOSIS — I10 Essential (primary) hypertension: Secondary | ICD-10-CM | POA: Insufficient documentation

## 2021-02-11 DIAGNOSIS — J441 Chronic obstructive pulmonary disease with (acute) exacerbation: Secondary | ICD-10-CM

## 2021-02-11 LAB — RESP PANEL BY RT-PCR (FLU A&B, COVID) ARPGX2
Influenza A by PCR: NEGATIVE
Influenza B by PCR: NEGATIVE
SARS Coronavirus 2 by RT PCR: NEGATIVE

## 2021-02-11 LAB — COMPREHENSIVE METABOLIC PANEL
ALT: 34 U/L (ref 0–44)
AST: 44 U/L — ABNORMAL HIGH (ref 15–41)
Albumin: 4.2 g/dL (ref 3.5–5.0)
Alkaline Phosphatase: 66 U/L (ref 38–126)
Anion gap: 12 (ref 5–15)
BUN: 6 mg/dL — ABNORMAL LOW (ref 8–23)
CO2: 24 mmol/L (ref 22–32)
Calcium: 9.5 mg/dL (ref 8.9–10.3)
Chloride: 96 mmol/L — ABNORMAL LOW (ref 98–111)
Creatinine, Ser: 0.81 mg/dL (ref 0.61–1.24)
GFR, Estimated: >60 mL/min (ref >60)
Glucose, Bld: 64 mg/dL — ABNORMAL LOW (ref 70–99)
Potassium: 4.4 mmol/L (ref 3.5–5.1)
Sodium: 132 mmol/L — ABNORMAL LOW (ref 135–145)
Total Bilirubin: 1 mg/dL (ref 0.3–1.2)
Total Protein: 7.9 g/dL (ref 6.5–8.1)

## 2021-02-11 LAB — CBC WITH DIFFERENTIAL/PLATELET
Abs Immature Granulocytes: 0.03 10*3/uL (ref 0.00–0.07)
Basophils Absolute: 0.2 10*3/uL — ABNORMAL HIGH (ref 0.0–0.1)
Basophils Relative: 3 %
Eosinophils Absolute: 1.8 10*3/uL — ABNORMAL HIGH (ref 0.0–0.5)
Eosinophils Relative: 28 %
HCT: 50 % (ref 39.0–52.0)
Hemoglobin: 16.9 g/dL (ref 13.0–17.0)
Immature Granulocytes: 1 %
Lymphocytes Relative: 37 %
Lymphs Abs: 2.3 10*3/uL (ref 0.7–4.0)
MCH: 28.8 pg (ref 26.0–34.0)
MCHC: 33.8 g/dL (ref 30.0–36.0)
MCV: 85.2 fL (ref 80.0–100.0)
Monocytes Absolute: 0.8 10*3/uL (ref 0.1–1.0)
Monocytes Relative: 13 %
Neutro Abs: 1.1 10*3/uL — ABNORMAL LOW (ref 1.7–7.7)
Neutrophils Relative %: 18 %
Platelets: 301 10*3/uL (ref 150–400)
RBC: 5.87 MIL/uL — ABNORMAL HIGH (ref 4.22–5.81)
RDW: 12.6 % (ref 11.5–15.5)
WBC: 6.1 10*3/uL (ref 4.0–10.5)
nRBC: 0 % (ref 0.0–0.2)

## 2021-02-11 LAB — PROTIME-INR
INR: 1.1 (ref 0.8–1.2)
Prothrombin Time: 13.4 seconds (ref 11.4–15.2)

## 2021-02-11 LAB — CBG MONITORING, ED: Glucose-Capillary: 71 mg/dL (ref 70–99)

## 2021-02-11 LAB — APTT: aPTT: 28 seconds (ref 24–36)

## 2021-02-11 LAB — LACTIC ACID, PLASMA
Lactic Acid, Venous: 1.6 mmol/L (ref 0.5–1.9)
Lactic Acid, Venous: 1.4 mmol/L (ref 0.5–1.9)

## 2021-02-11 MED ORDER — SODIUM CHLORIDE 0.9 % IV SOLN
500.0000 mg | INTRAVENOUS | Status: DC
  Administered 2021-02-11: 500 mg via INTRAVENOUS
  Filled 2021-02-11: qty 500

## 2021-02-11 MED ORDER — SODIUM CHLORIDE 0.9 % IV SOLN
2.0000 g | INTRAVENOUS | Status: DC
  Administered 2021-02-11: 2 g via INTRAVENOUS
  Filled 2021-02-11: qty 20

## 2021-02-11 MED ORDER — ALBUTEROL (5 MG/ML) CONTINUOUS INHALATION SOLN
10.0000 mg/h | INHALATION_SOLUTION | RESPIRATORY_TRACT | Status: AC
  Administered 2021-02-11: 10 mg/h via RESPIRATORY_TRACT
  Filled 2021-02-11: qty 20

## 2021-02-11 MED ORDER — ALBUTEROL (5 MG/ML) CONTINUOUS INHALATION SOLN: 10.0000 mg/h | INHALATION_SOLUTION | RESPIRATORY_TRACT | Status: AC

## 2021-02-11 MED ORDER — ALBUTEROL SULFATE HFA 108 (90 BASE) MCG/ACT IN AERS
6.0000 | INHALATION_SPRAY | Freq: Once | RESPIRATORY_TRACT | Status: AC
  Administered 2021-02-11: 6 via RESPIRATORY_TRACT
  Filled 2021-02-11: qty 6.7

## 2021-02-11 MED ORDER — METHYLPREDNISOLONE SODIUM SUCC 125 MG IJ SOLR
125.0000 mg | Freq: Once | INTRAMUSCULAR | Status: AC
  Administered 2021-02-11: 125 mg via INTRAVENOUS
  Filled 2021-02-11: qty 2

## 2021-02-11 MED ORDER — LACTATED RINGERS IV SOLN: INTRAVENOUS | Status: DC

## 2021-02-11 NOTE — ED Provider Notes (Signed)
11:30 PM Assumed care from Dr. Hyacinth Meeker, please see their note for full history, physical and decision making until this point. In brief this is a 65 y.o. year old male who presented to the ED tonight with Shortness of Breath     65 yo M w/ h/o COPD here with exacerbation of same. Apparently looked pretty bad on arrival but has made remarkable turnaround after steroids/CAT. covid negative. If still doing well, ambulates well, wants to go home, then needs doxy/pred.  Patient ambulated and had a pulse ox of around 89% during beginning and 86% when he walked.  Came slightly more tachypneic.  I discussed with the patient and he felt like this was near his baseline but still little bit short of breath.  I discussed admit him to the hospital for further management he states he feels much better and can get back quickly.  Secondary to the shared decision making it was decided the patient can be discharged on steroids, breathing treatments and antibiotics.  Will return here for any new or worsening symptoms.  Labs, studies and imaging reviewed by myself and considered in medical decision making if ordered. Imaging interpreted by radiology.  Labs Reviewed  COMPREHENSIVE METABOLIC PANEL - Abnormal; Notable for the following components:      Result Value   Sodium 132 (*)    Chloride 96 (*)    Glucose, Bld 64 (*)    BUN 6 (*)    AST 44 (*)    All other components within normal limits  CBC WITH DIFFERENTIAL/PLATELET - Abnormal; Notable for the following components:   RBC 5.87 (*)    Neutro Abs 1.1 (*)    Eosinophils Absolute 1.8 (*)    Basophils Absolute 0.2 (*)    All other components within normal limits  RESP PANEL BY RT-PCR (FLU A&B, COVID) ARPGX2  CULTURE, BLOOD (ROUTINE X 2)  CULTURE, BLOOD (ROUTINE X 2)  LACTIC ACID, PLASMA  LACTIC ACID, PLASMA  PROTIME-INR  APTT  CBG MONITORING, ED    DG Chest Port 1 View  Final Result      No follow-ups on file.    Remas Sobel, Barbara Cower, MD 02/12/21  [phone]

## 2021-02-11 NOTE — ED Triage Notes (Signed)
Pt to the ED with shortness of breath that began this morning. Pt has a hx of COPD and ran out of one of his inhalers.  Pt states he hasn't smoked in the last 5 days due to not feeling well.

## 2021-02-11 NOTE — ED Provider Notes (Signed)
[facility] Provider Note   CSN: [id number] Arrival date & time: 02/11/21  1938     History Chief Complaint  Patient presents with  . Shortness of Breath    [name] is a 65 y.o. male.  HPI   This patient is a very pleasant 65 year old male, he has a known history of COPD, he is not on home oxygen.  He has a history of tobacco use and smokes every day until 5 days ago when he quit.  He has had hypertension, alcohol abuse, states that he takes his medications daily including his albuterol inhaler which she has been using recently for increasing shortness of breath.  The shortness of breath started yesterday, it has been persistent, gradually worsening and became severe this evening.  He has been coughing up lots of green-yellow and brown phlegm.  He has not had fevers but does have chills, there is a chest tightness but no specific focal pain.  No abdominal pain, no nausea or vomiting.  He has had his Covid vaccinations and booster.  The patient has not been around anybody who has been sick.  He states that with the albuterol inhaler he gets a little bit better but it comes right back.  He now states that he ran out of one of his inhalers but cannot remember which one  Past Medical History:  Diagnosis Date  . Alcohol abuse   . Allergic rhinitis   . Asthma   . COPD (chronic obstructive pulmonary disease) (HCC)   . Coronary atherosclerosis    Based on chest CT 2012  . Essential hypertension   . History of neutropenia    Also lymphocytosis - followed by Dr. Zigmund DanielFormanek  . Nicotine addiction   . Olecranon bursitis, right elbow     Patient Active Problem List   Diagnosis Date Noted  . Lumbar spinal stenosis 12/16/2020  . Hyponatremia 12/15/2020  . Alcohol dependence (HCC) 12/15/2020  . Tobacco abuse 12/15/2020  . Acute gastroenteritis 11/29/2020  . Solitary pulmonary nodule 10/20/2020  . COPD GOLD 3 likely and still smoking  10/18/2020  . Cigarette  smoker 10/18/2020  . Viral gastroenteritis 09/29/2020  . Impaired fasting glucose 05/17/2020  . Screening for colorectal cancer 05/17/2020  . Enlarged prostate 05/17/2020  . GAD (generalized anxiety disorder) 08/01/2016  . Centrilobular emphysema (HCC) 11/01/2015  . Alcohol addiction (HCC) 06/23/2015  . Vitamin D deficiency 03/24/2015  . Coronary atherosclerosis 03/11/2014  . Abnormal EKG 02/17/2014  . Depression with anxiety 02/10/2013  . MALNUTRITION, MILD 11/27/2010  . Nicotine dependence, cigarettes, with other nicotine-induced disorders 03/30/2008  . Essential hypertension 03/30/2008    Past Surgical History:  Procedure Laterality Date  . MULTIPLE TOOTH EXTRACTIONS  June 2012  . OLECRANON BURSECTOMY Right 11/26/2019   Procedure: OLECRANON BURSECTOMY;  Surgeon: Vickki HearingHarrison, Stanley E, MD;  Location: [facility];  Service: Orthopedics;  Laterality: Right;  pt knows to arrive at 6:15       Family History  Problem Relation Age of Onset  . Diabetes Mother   . Hypertension Mother   . Diabetes Father   . Hypertension Sister     Social History   Tobacco Use  . Smoking status: Current Every Day Smoker    Packs/day: 0.50    Years: 38.00    Pack years: 19.00    Types: Cigarettes  . Smokeless tobacco: Never Used  . Tobacco comment: smokes 6 cigarettes per day 10/18/2020  Vaping Use  . Vaping Use: Never  used  Substance Use Topics  . Alcohol use: Yes    Comment: occasionally a beer  . Drug use: No    Home Medications Prior to Admission medications   Medication Sig Start Date End Date Taking? Authorizing Provider  albuterol (VENTOLIN HFA) 108 (90 Base) MCG/ACT inhaler Inhale 2 puffs into the lungs every 6 (six) hours as needed for wheezing or shortness of breath. 11/30/20  Yes Kerri Perches, MD  amLODipine (NORVASC) 5 MG tablet Take 1 tablet (5 mg total) by mouth daily. 11/29/20  Yes Kerri Perches, MD  busPIRone (BUSPAR) 5 MG tablet Take 1 tablet (5 mg total) by  mouth 3 (three) times daily. 11/30/20  Yes Kerri Perches, MD  diphenoxylate-atropine (LOMOTIL) 2.5-0.025 MG tablet Take 1 tablet by mouth 4 (four) times daily as needed for diarrhea or loose stools. 11/29/20  Yes Kerri Perches, MD  ipratropium-albuterol (DUONEB) 0.5-2.5 (3) MG/3ML SOLN Every 6 hours as needed. Patient taking differently: Take 3 mLs by nebulization every 6 (six) hours as needed (shortness of breath). 11/30/20  Yes Kerri Perches, MD  polyethylene glycol-electrolytes (TRILYTE) 420 g solution Take 4,000 mLs by mouth as directed. 12/20/20  Yes Dolores Frame, MD  rosuvastatin (CRESTOR) 5 MG tablet Take 1 tablet (5 mg total) by mouth daily. 11/30/20 02/28/21 Yes Kerri Perches, MD  fluticasone (FLOVENT DISKUS) 50 MCG/BLIST diskus inhaler Inhale 1 puff into the lungs 2 (two) times daily. 12/13/20   Kerri Perches, MD  HYDROcodone-acetaminophen (NORCO/VICODIN) 5-325 MG tablet Take 1 tablet by mouth every 6 (six) hours as needed for moderate pain. 12/17/20   Catarina Hartshorn, MD  methocarbamol (ROBAXIN) 500 MG tablet Take 1 tablet (500 mg total) by mouth every 6 (six) hours as needed for muscle spasms. 12/17/20   Catarina Hartshorn, MD  predniSONE (DELTASONE) 10 MG tablet Take 6 tablets (60 mg total) by mouth daily with breakfast. And decrease by one tablet daily 12/18/20   Tat, Onalee Hua, MD    Allergies    Lisinopril  Review of Systems   Review of Systems  All other systems reviewed and are negative.   Physical Exam Updated Vital Signs BP (!) 168/105   Pulse (!) 101   Temp 98.1 F (36.7 C) (Oral)   Resp (!) 23   Ht 1.829 m (6')   Wt 60.8 kg   SpO2 100%   BMI 18.17 kg/m   Physical Exam Vitals and nursing note reviewed.  Constitutional:      General: He is not in acute distress.    Appearance: He is well-developed and well-nourished.  HENT:     Head: Normocephalic and atraumatic.     Mouth/Throat:     Mouth: Oropharynx is clear and moist.     Pharynx: No  oropharyngeal exudate.  Eyes:     General: No scleral icterus.       Right eye: No discharge.        Left eye: No discharge.     Extraocular Movements: EOM normal.     Conjunctiva/sclera: Conjunctivae normal.     Pupils: Pupils are equal, round, and reactive to light.  Neck:     Thyroid: No thyromegaly.     Vascular: No JVD.  Cardiovascular:     Rate and Rhythm: Regular rhythm. Tachycardia present.     Pulses: Intact distal pulses.     Heart sounds: Normal heart sounds. No murmur heard. No friction rub. No gallop.      Comments: Sinus  tachycardia, rate of 115 bpm Pulmonary:     Effort: Tachypnea, accessory muscle usage and respiratory distress present.     Breath sounds: Wheezing present. No rales.     Comments: Prolonged expiratory phase, significant increased work of breathing with accessory muscle use Abdominal:     General: Bowel sounds are normal. There is no distension.     Palpations: Abdomen is soft. There is no mass.     Tenderness: There is no abdominal tenderness.  Musculoskeletal:        General: No tenderness or edema. Normal range of motion.     Cervical back: Normal range of motion and neck supple.  Lymphadenopathy:     Cervical: No cervical adenopathy.  Skin:    General: Skin is warm and dry.     Findings: No erythema or rash.  Neurological:     Mental Status: He is alert.     Coordination: Coordination normal.  Psychiatric:        Mood and Affect: Mood and affect normal.        Behavior: Behavior normal.     ED Results / Procedures / Treatments   Labs (all labs ordered are listed, but only abnormal results are displayed) Labs Reviewed  COMPREHENSIVE METABOLIC PANEL - Abnormal; Notable for the following components:      Result Value   Sodium 132 (*)    Chloride 96 (*)    Glucose, Bld 64 (*)    BUN 6 (*)    AST 44 (*)    All other components within normal limits  CBC WITH DIFFERENTIAL/PLATELET - Abnormal; Notable for the following components:    RBC 5.87 (*)    Neutro Abs 1.1 (*)    Eosinophils Absolute 1.8 (*)    Basophils Absolute 0.2 (*)    All other components within normal limits  RESP PANEL BY RT-PCR (FLU A&B, COVID) [id number]  CULTURE, BLOOD (ROUTINE X 2)  CULTURE, BLOOD (ROUTINE X 2)  LACTIC ACID, PLASMA  LACTIC ACID, PLASMA  PROTIME-INR  APTT    EKG EKG Interpretation  Date/Time:  Saturday February 11 2021 19:55:18 EST Ventricular Rate:  106 PR Interval:    QRS Duration: 72 QT Interval:  330 QTC Calculation: 439 R Axis:   82 Text Interpretation: Sinus tachycardia Biatrial enlargement Borderline right axis deviation since last tracing no significant change Confirmed by Eber Hong ([id number]) on 02/11/2021 7:58:22 PM   Radiology DG Chest Port 1 View  Result Date: 02/11/2021 CLINICAL DATA:  Cough, COVID like illness, COPD EXAM: PORTABLE CHEST 1 VIEW COMPARISON:  Chest x-ray 10/19/2020 CT chest 11/14/2010 FINDINGS: The heart size and mediastinal contours are within normal limits. Mild atherosclerotic plaque. Emphysema. No focal consolidation. No pulmonary edema. No pleural effusion. No pneumothorax. No acute osseous abnormality. Old healed left rib fracture. Old healed right rib fracture. IMPRESSION: 1. No active disease. 2. Aortic Atherosclerosis (ICD10-I70.0) and Emphysema (ICD10-J43.9). 3. If clinically appropriate, please evaluate patient on age and smoking history to determine if patient meets the inclusion criteria for specialized low-dose lung cancer screening chest CT. Electronically Signed   By: Tish Frederickson M.D.   On: 02/11/2021 21:16    Procedures Procedures   Medications Ordered in ED Medications  lactated ringers infusion ( Intravenous New Bag/Given 02/11/21 2031)  cefTRIAXone (ROCEPHIN) 2 g in sodium chloride 0.9 % 100 mL IVPB (0 g Intravenous Stopped 02/11/21 2116)  azithromycin (ZITHROMAX) 500 mg in sodium chloride 0.9 % 250 mL IVPB (500 mg Intravenous New Bag/Given  02/11/21 2120)  albuterol  (PROVENTIL,VENTOLIN) solution continuous neb ( Nebulization Canceled Entry 02/11/21 2015)  albuterol (PROVENTIL,VENTOLIN) solution continuous neb (10 mg/hr Nebulization New Bag/Given 02/11/21 2211)  methylPREDNISolone sodium succinate (SOLU-MEDROL) 125 mg/2 mL injection 125 mg (125 mg Intravenous Given 02/11/21 2037)  albuterol (VENTOLIN HFA) 108 (90 Base) MCG/ACT inhaler 6 puff (6 puffs Inhalation Given 02/11/21 2038)    ED Course  I have reviewed the triage vital signs and the nursing notes.  Pertinent labs & imaging results that were available during my care of the patient were reviewed by me and considered in my medical decision making (see chart for details).    MDM Rules/Calculators/A&P                          The patient has increasing shortness of breath, he has hypoxic to 92% on room air.  He is very tachypneic, he has diffuse expiratory wheezing and likely has had a COPD exacerbation which may be exacerbated by an underlying infection, viral, bacterial or possibly Covid which seems less likely given his vaccination status.  He has no myalgias, no upper respiratory symptoms, will start with albuterol inhalers, steroids, once Covid vaccine is back will need continuous nebulizer, may need admission due to increasing work of breathing  The patient is reexamined after multiple MDI treatments, he is currently in the middle of a continuous nebulizer.  He states that he is feeling incredibly better.  His heart rate is down to one hundred and five, respirations are down to twenty, oxygen is 100%, afebrile, blood pressure is improved, chest x-ray shows no signs of acute infiltrate though he does have evidence of ongoing and severe emphysema.  At change of shift patient will be signed out to oncoming emergency physician to reevaluate once nebulizer treatments are completed to determine disposition.  The patient does want to go home but is amenable to admission should he need it.  At this point I  think he is making great strides and likely can be discharged if he continues to improve  Would send home with prednisone and doxycycline given increased sputum in absence of pna on xray.  Final Clinical Impression(s) / ED Diagnoses Final diagnoses:  COPD exacerbation (HCC)  Shortness of breath    Rx / DC Orders ED Discharge Orders    None       Eber Hong, MD 02/11/21 2239

## 2021-02-11 NOTE — Progress Notes (Signed)
Elink following for sepsis protocol. 

## 2021-02-12 MED ORDER — DOXYCYCLINE HYCLATE 100 MG PO CAPS: 100.0000 mg | ORAL_CAPSULE | Freq: Two times a day (BID) | ORAL | 0 refills | Status: DC

## 2021-02-12 MED ORDER — PREDNISONE 20 MG PO TABS: ORAL_TABLET | ORAL | 0 refills | Status: DC

## 2021-02-12 NOTE — Discharge Instructions (Addendum)
Your oxygen saturation was slightly low. This could be similar to where it usually is, however if you have any worsening in your breathing or cough that is not improved with your inhaler or nebulizer machine return to the emergency department immediately.

## 2021-02-12 NOTE — ED Notes (Signed)
Pt up out of bed and walked to nurses' station and back to bed; pt O2 sats at 89% when started pt O2 decreased to 85% but initially stayed around 86%; pt assisted back to bed and pt placed on O2 at 2L [place] and sats increased to 91%. Dr. Clayborne Dana informed of pt condition

## 2021-02-14 NOTE — Telephone Encounter (Signed)
Left detailed message with [name] informing her to call us back.

## 2021-02-16 LAB — CULTURE, BLOOD (ROUTINE X 2)
Culture: NO GROWTH
Culture: NO GROWTH
Special Requests: ADEQUATE
Special Requests: ADEQUATE

## 2021-02-17 ENCOUNTER — Other Ambulatory Visit: Payer: Self-pay | Admitting: *Deleted

## 2021-02-17 NOTE — Patient Outreach (Addendum)
[facility]) Care Management  02/17/2021  NICCOLO BURGGRAF 09/21/1956 [id number]   [facility] unsuccessful outreach to complex care patient [name] as referred to [facility] on 09/04/19 for MD referral from Dr [name] Referral Reason:pt has medicare but can't afford medications does not have drug coverage- has emphysema -Resolved medicare coverage issue in October 2020 Insurance: [employer] Medicare/medicaid [place] family planningeffective 11/7/2018SSI disability [employer] medicare effective 10/06/19  Admissions/ ED visits 02/11/21 ED visit shortness of breath (sob)/COPD exacerbation with discharge home 12/15/20 -12/17/20 admission for Hyponatremia, right flank pain/right hip pain/back pain/sever lumbar spinal stenosis/alcohol dependence/THC use/Tobacco abuse/COPD/alcoholic peripheral neuropathy    Outreach attempt to the mobile  number [phone] No answer. [facility] RN CM unable to leave a message as voice mailbox not set up  Attempt to reach pcp office after reviewed EPIC notes indicating pcp RN attempts to reach RN CM  Outreach to [phone] & [phone] the pcp office is closed EPIC in basket message sent to [facility] - Pending response from her   Outreach to home number [phone] home phone line busy x 2   Plan: [facility] RN CM scheduled this patient for another call attempt within 4-7 business days  Brodi Kari L. Noelle Penner, RN, BSN, CCM [facility] Telephonic Care Management Care Coordinator Office number [phone] Mobile number [phone]  Main [facility] number [phone] Fax number [phone]

## 2021-02-24 ENCOUNTER — Other Ambulatory Visit: Payer: Self-pay

## 2021-02-24 ENCOUNTER — Encounter: Payer: Self-pay | Admitting: *Deleted

## 2021-02-24 ENCOUNTER — Other Ambulatory Visit: Payer: Self-pay | Admitting: *Deleted

## 2021-02-24 NOTE — Patient Outreach (Signed)
[facility]) Care Management  02/24/2021  [name] 1956/11/11 [id number]   [facility] outreach to complex care patient [name] as referred to [facility] on 09/04/19 for MD referral from Dr [name] Referral Reason:pt has medicare but can't afford medications does not have drug coverage- has emphysema -Resolved medicare coverage issue in October 2020 Insurance: Next[employer] Medicare/medicaid of [place] family planningeffective 11/7/2018SSI disability [employer] medicare effective 10/06/19  Admissions/ ED visits 02/11/21 ED visit shortness of breath (sob)/COPD exacerbation with discharge home 12/15/20 -12/17/20 admission for Hyponatremia, right flank pain/right hip pain/back pain/sever lumbar spinal stenosis/alcohol dependence/THC use/Tobacco abuse/COPD/alcoholic peripheral neuropathy  Outreach to the mobile  number [phone]  Brother, Walworth, from [place] remains in the home assisting their mother, Lendell Caprice who is improving (walking with a cane)     Chronic obstructive pulmonary disease (COPD)  Reviewed his  02/11/21 ED visit for COPD exacerbation  He confirms completing antibiotics, and continues with inhalers & nebulizer prn He states he is much better and will start lawn care soon Encouraged to wear mask when doing lawn care   Smoking still smoking but "just about to quit"  Voiced interest to get on [place] with assist of primary care provider (PCP) The rubber band therapy to encourage smoke cessation did not work  "It's a habit that is hard to break. I have been smoking since I was [age] years old"   Depression He denies depression Medication review confirms taking Crestor 5 mg daily, Norvasc 5 mg daily, aspirin 81 mg daily , Buspar 5 mg tid, Albuterol inhaler prn, Duoneb prn  He is able to correctly tell [facility] RN CM the correct frequency of medication usage Pt still has difficulty recalling why he is using each but each again reviewed by [facility] RN CM    Cancelled colonoscopy Mr [name] is with a history of missing 2 scheduled appointment He still voices interest in completing a colonoscopy and inquired about a date. EPIC indicates no scheduled colonoscopy. Discussed this with him. He reports he has the prep at home and has his brother, [name] that can guide him through the process.  His brother will be available until his mother is improved.  [facility] RN CM sent an EPIC in basket message to his pcp and gastroenterologist to inquire if colonoscopy still needed Dr Vladimir Crofts, gastroenterologist, confirms pt is still in need of his colonoscopy for screening. Pt will need help with scheduling and prep evaluation  Plan: [facility] RN CM scheduled this patient for another call attempt within 30 business days Pt encouraged to return a call to [facility] RN CM prn Goals Addressed              This Visit's Progress     Patient Stated   .  [facility]) Make and Keep All Appointments (pt-stated)   Not on track     Timeframe:  Long-Range Goal Priority:  Medium Start Date:           12/12/20                  Expected End Date:          04/14/21             Follow Up Date 03/09/21    - ask family or friend for a ride - keep a calendar with appointment dates    Notes:  02/24/21 Still has not completed colonoscopy Noted not scheduled for any further MD visits. Brother ED Still  at the home caring for pt mother and available to assist pt 12/12/20 calls to GI and pcp to assist with rescheduling of missed 11/16/20 colonoscopy    .  [facility]) Maintain My Quality of Life (pt-stated)   On track     Follow Up Date 03/09/21    - discuss my treatment options with the doctor or nurse - do one enjoyable thing every day - make shared treatment decisions with doctor - spend time outdoors at least 3 times a week      Notes:  02/24/21 Pt continues to smoke, drink, spend time with family and friends and complete his lawn care (enjoyable tasks)     .  [facility]) Stop or Cut Down Tobacco  Use (pt-stated)   Not on track     Follow Up Date 03/09/21    - cut down amount of tobacco product used (chew, cigars)     Notes: 02/24/21 continues to smoke but "just about to quit"  Voiced interest to get on [place] with assist of primary care provider (PCP) The rubber band therapy to encourage smoke cessation did not work  "It's a habit that is hard to break. I have been smoking since I was [age] years old"      .  [facility]) Track and Manage My Symptoms (pt-stated)   On track     Follow Up Date 03/09/21    - eliminate symptom triggers at home - follow rescue plan if symptoms flare-up - keep follow-up appointments      Notes:  02/24/21 Continue with triggers of COPD to include smoking, lawn care Has inhalers, nebulizer Recent completed antibiotics after 02/11/21 ED visit States he will start wearing mask with lawn care Has not completed colonoscopy and not noted to have any scheduled MD visits       Jalana Moore L. Noelle Penner, RN, BSN, CCM [facility] Telephonic Care Management Care Coordinator Office number [phone] Mobile number [phone]  Main [facility] number [phone] Fax number [phone]

## 2021-03-09 ENCOUNTER — Other Ambulatory Visit: Payer: Self-pay | Admitting: *Deleted

## 2021-03-09 NOTE — Patient Outreach (Signed)
[facility]) Care Management  03/09/2021  [name] May 21, 1956 [id number]   Encounter opened in error    [name], RN, BSN, CCM [facility] Telephonic Care Management Care Coordinator Office number [phone] Main [facility] number [phone] Fax number [phone]

## 2021-03-10 ENCOUNTER — Other Ambulatory Visit: Payer: Self-pay | Admitting: *Deleted

## 2021-03-10 ENCOUNTER — Other Ambulatory Visit: Payer: Self-pay

## 2021-03-10 NOTE — Patient Outreach (Addendum)
[facility]) Care Management  03/10/2021  ANOTHY BUFANO Oct 18, 1956 [id number]   THNoutreach tocomplex care patient Mr ASAHD CAN as referred to [facility] on 09/04/19 for MD referral from Dr [name] Referral Reason:pt has medicare but can't afford medications does not have drug coverage- has emphysema -Resolved medicare coverage issue in October 2020 Insurance: NextGen Medicare/medicaid of [place] family planningeffective 11/7/2018SSI disability Hum[employer] medicare effective 10/06/19  Admissions/ ED visits 02/11/21 ED visitshortness of breath (sob)/COPD exacerbation with discharge home 12/15/20 -12/17/20 admission for Hyponatremia, right flank pain/right hip pain/back pain/sever lumbar spinal stenosis/alcohol dependence/THC use/Tobacco abuse/COPD/alcoholic peripheral neuropathy  Outreach to thehomenumber [phone]  Patient is able to verify HIPAA [facility] Portability and Accountability Act) identifiers Reviewed and addressed the purpose of the follow up call with the patient  Consent: [facility] ([facility] Customer service manager) RN CM reviewed [facility] services with patient. Patient gave verbal consent for services.  Mr Kentarius Parveen provided permission for [facility] RN CM to speak at any time with his mother, myrtle and/or brother Omer Jack, [name], from [place] remains in the home assisting their mother, Lendell Caprice who is improving (walking with a cane)   Eddie agreed to assisting with Mr Verga colonoscopy after [facility] RN CM reviewed [facility] services and pending goal for completion of pt colonoscopy  Outreach to Dr Boykin Peek, gastroenterologist at  [phone] unsuccessful when conferencing with pt and his brother [name] to get colonoscopy scheduled. Left a message requesting a return call to [facility] RN CM and also included pt's home number   Learner assessment re evaluated (total change from 15 to 14) related to his difficulty with readiness to change  Readiness to change  score 4.67   Plan: [facility] RN CM scheduled this patient for another call attempt within 30 business days Pt encouraged to return a call to [facility] RN CM prn  Goals Addressed              This Visit's Progress     Patient Stated   .  [facility]) Make and Keep All Appointments (pt-stated)   On track     Timeframe:  Long-Range Goal Priority:  Medium Start Date:           12/12/20                  Expected End Date:          04/14/21             Follow Up Date 03/22/21     - ask family or friend for a ride - keep a calendar with appointment dates     Notes:  03/10/21 agreed to the attempt to assist with re scheduling colonoscopy with brother [name] help LVM at GI office  02/24/21 Still has not completed colonoscopy Noted not scheduled for any further MD visits. Brother ED Still at the home caring for pt mother and available to assist pt 12/12/20 calls to GI and pcp to assist with rescheduling of missed 11/16/20 colonoscopy    .  [facility]) Maintain My Quality of Life (pt-stated)   On track     Follow Up Date 03/22/21    - discuss my treatment options with the doctor or nurse - do one enjoyable thing every day - make shared treatment decisions with doctor - spend time outdoors at least 3 times a week     Notes:  03/10/21 agreed to the attempt to assist with re scheduling colonoscopy with brother [name] help  LVM at GI office  02/24/21 Pt continues to smoke, drink, spend time with family and friends and complete his lawn care (enjoyable tasks)     .  [facility]) Stop or Cut Down Tobacco Use (pt-stated)   Not on track     Follow Up Date 03/22/21    - cut down amount of tobacco product used (chew, cigars)     Notes: 02/24/21 continues to smoke but "just about to quit"  Voiced interest to get on [place] with assist of primary care provider (PCP) The rubber band therapy to encourage smoke cessation did not work  "It's a habit that is hard to break. I have been smoking since I was [age] years old"      .   [facility]) Track and Manage My Symptoms (pt-stated)   On track     Follow Up Date 03/22/21    - eliminate symptom triggers at home - follow rescue plan if symptoms flare-up - keep follow-up appointments      Notes:  02/24/21 Continue with triggers of COPD to include smoking, lawn care Has inhalers, nebulizer Recent completed antibiotics after 02/11/21 ED visit States he will start wearing mask with lawn care Has not completed colonoscopy and not noted to have any scheduled MD visits       Dawaun Brancato L. Noelle Penner, RN, BSN, CCM [facility] Telephonic Care Management Care Coordinator Office number [phone] Main [facility] number [phone] Fax number [phone]

## 2021-03-13 ENCOUNTER — Other Ambulatory Visit: Payer: Self-pay | Admitting: *Deleted

## 2021-03-13 ENCOUNTER — Other Ambulatory Visit (INDEPENDENT_AMBULATORY_CARE_PROVIDER_SITE_OTHER): Payer: Self-pay

## 2021-03-13 ENCOUNTER — Encounter (INDEPENDENT_AMBULATORY_CARE_PROVIDER_SITE_OTHER): Payer: Self-pay

## 2021-03-13 ENCOUNTER — Telehealth (INDEPENDENT_AMBULATORY_CARE_PROVIDER_SITE_OTHER): Payer: Self-pay

## 2021-03-13 DIAGNOSIS — Z1211 Encounter for screening for malignant neoplasm of colon: Secondary | ICD-10-CM

## 2021-03-13 DIAGNOSIS — Z1212 Encounter for screening for malignant neoplasm of rectum: Secondary | ICD-10-CM

## 2021-03-13 MED ORDER — PEG 3350-KCL-NA BICARB-NACL 420 G PO SOLR: 4000.0000 mL | ORAL | 0 refills | Status: DC

## 2021-03-13 NOTE — Telephone Encounter (Signed)
[name], CMA  

## 2021-03-13 NOTE — Progress Notes (Signed)
[name] has been rescheduled on 03/21/21 and his Son [name] is aware of Date & time

## 2021-03-13 NOTE — Patient Outreach (Addendum)
[facility]) Care Management  03/13/2021  [name] March 18, 1956 [id number]  [facility] Care coordination- colonoscopy [name] as referred to [facility] on 09/04/19 for MD referral from Dr [name] Referral Reason:pt has medicare but can't afford medications does not have drug coverage- has emphysema -Resolved medicare coverage issue in October 2020 Insurance: NextGen Medicare/medicaid of  family planningeffective 11/7/2018SSI disability Humana medicare effective 10/06/19  Admissions/ ED visits 02/11/21 ED visitshortness of breath (sob)/COPD exacerbation with discharge home 12/15/20 -12/17/20 admission for Hyponatremia, right flank pain/right hip pain/back pain/sever lumbar spinal stenosis/alcohol dependence/THC use/Tobacco abuse/COPD/alcoholic peripheral neuropathy   Outreach received from [facility], nurse a Dr Boykin Peek office, gastroenterologist to assist with coordination of colonoscopy  [facility] RN CM completed a conference call with [name] and [name] [phone]) to coordinate this procedure  Nicholas Rosenbloom was scheduled (after consulting with his Nicholas, [name]) to go  03/20/21 at 0820 to get his covid test [facility], then go to Dr Boykin Peek office on the second floor [address] to get his prep instructions Take prep on night of 03/20/21 as ordered  03/21/21 at 0845 he is to have his colonoscopy at [facility] He will arrive at the main hospital entrance  Nicholas [name] informed that [facility] RN CM also took notes to also try to assist   Nicholas Czar, RN to call his prep items into [employer] today just in case he does not have the correct items  [facility] RN CM will outreach for reminder calls to review the instructions and to answer question  Plan [facility] RN CM scheduled this patient for another call attempt within4-7 business days Pt encouraged to return a call to [facility] RN CM prn  Goals Addressed              This Visit's Progress     Patient  Stated   .  [facility]) Make and Keep All Appointments (pt-stated)   On track     Timeframe:  Long-Range Goal Priority:  Medium Start Date:           12/12/20                  Expected End Date:          04/14/21             Follow Up Date 03/16/21     - ask family or friend for a ride - keep a calendar with appointment dates     Notes:  03/13/21 agreed to coordination of covid test on 03/20/21 & colonoscopy for 03/21/21 with Larita Fife, GI RN & [name] 03/10/21 agreed to the attempt to assist with re scheduling colonoscopy with [name] help LVM at GI office  02/24/21 Still has not completed colonoscopy Noted not scheduled for any further MD visits. Nicholas ED Still at the home caring for pt mother and available to assist pt 12/12/20 calls to GI and pcp to assist with rescheduling of missed 11/16/20 colonoscopy    .  [facility]) Maintain My Quality of Life (pt-stated)   On track     Follow Up Date 03/16/21    - discuss my treatment options with the doctor or nurse - do one enjoyable thing every day - make shared treatment decisions with doctor - spend time outdoors at least 3 times a week     Notes:  03/13/21 agreed to coordination of covid test on 03/20/21 & colonoscopy for 03/21/21 with Larita Fife, GI RN &  [name] 03/10/21 agreed to the attempt to assist with re scheduling colonoscopy with [name] help LVM at GI office  02/24/21 Pt continues to smoke, drink, spend time with family and friends and complete his lawn care (enjoyable tasks)         Kippy Gohman L. Noelle Penner, RN, BSN, CCM [facility] Telephonic Care Management Care Coordinator Office number [phone] Mobile number [phone]  Main [facility] number [phone] Fax number [phone]

## 2021-03-16 ENCOUNTER — Other Ambulatory Visit: Payer: Self-pay

## 2021-03-16 ENCOUNTER — Other Ambulatory Visit: Payer: Self-pay | Admitting: *Deleted

## 2021-03-16 NOTE — Patient Outreach (Signed)
[facility]) Care Management  03/16/2021  [name] 1956-09-14 [id number]  [facility] Care coordination- colonoscopy [name] as referred to [facility] on 09/04/19 for MD referral from Dr [name] Referral Reason:pt has medicare but can't afford medications does not have drug coverage- has emphysema -Resolved medicare coverage issue in October 2020 Insurance: NextGen Medicare/medicaid of [place] family planningeffective 11/7/2018SSI disability Hum[employer] medicare effective 10/06/19  Admissions/ ED visits 02/11/21 ED visitshortness of breath (sob)/COPD exacerbation with discharge home 12/15/20 -12/17/20 admission for Hyponatremia, right flank pain/right hip pain/back pain/sever lumbar spinal stenosis/alcohol dependence/THC use/Tobacco abuse/COPD/alcoholic peripheral neuropathy    Follow up  [facility] RN CM outreached to his brother [name] to review that on  03/20/21 at 0820 Nicholas Concannon need to get his covid test at [facility], then go to Dr Boykin Peek office on the second floor [address] to get his prep instructions Take prep on night of 03/20/21 as ordered  He voiced understanding and was able to repeat back the correct process to get Nicholas Pillsbury ready for his colonoscopy    Plan [facility] RN CM scheduled this patient for another call attempt within2-4 business days Pt encouraged to return a call to [facility] RN CM prn  [name], RN, BSN, CCM [facility] Telephonic Care Management Care Coordinator Office number [phone] Main [facility] number [phone] Fax number [phone]

## 2021-03-17 ENCOUNTER — Encounter (HOSPITAL_COMMUNITY): Payer: Self-pay | Admitting: Gastroenterology

## 2021-03-20 ENCOUNTER — Other Ambulatory Visit: Payer: Self-pay | Admitting: *Deleted

## 2021-03-20 ENCOUNTER — Other Ambulatory Visit (HOSPITAL_COMMUNITY)
Admission: RE | Admit: 2021-03-20 | Discharge: 2021-03-20 | Disposition: A | Discharge status: Home / Self Care | Payer: Medicare Other | Source: Ambulatory Visit | Attending: Gastroenterology | Admitting: Gastroenterology

## 2021-03-20 ENCOUNTER — Other Ambulatory Visit: Payer: Self-pay

## 2021-03-20 DIAGNOSIS — Z20822 Contact with and (suspected) exposure to covid-19: Secondary | ICD-10-CM | POA: Diagnosis not present

## 2021-03-20 DIAGNOSIS — Z01812 Encounter for preprocedural laboratory examination: Secondary | ICD-10-CM | POA: Diagnosis present

## 2021-03-20 NOTE — Patient Outreach (Signed)
[facility]) Care Management  03/20/2021  DANIIL LABARGE 1956-02-25 [id number]   [facility] Outreach to Complex care patient  Mr GARNIE BORCHARDT as referred to [facility] on 09/04/19 for MD referral from Dr [name] Referral Reason:pt has medicare but can't afford medications does not have drug coverage- has emphysema -Resolved medicare coverage issue in October 2020 Insurance: NextGen Medicare/medicaid of [place] family planningeffective 11/7/2018SSI disability Humana medicare effective 10/06/19  Admissions/ ED visits 02/11/21 ED visitshortness of breath (sob)/COPD exacerbation with discharge home 12/15/20 -12/17/20 admission for Hyponatremia, right flank pain/right hip pain/back pain/sever lumbar spinal stenosis/alcohol dependence/THC use/Tobacco abuse/COPD/alcoholic peripheral neuropathy  Outreach to thehomenumber [phone]  Mr [name], brother answered [facility] RN CM inquired if Mr Trahan was able to make it to gt his covid test and to get his colonoscopy instructions and preparation He confirms no care coordination needs or medical needs at this time  Plans  [facility] RN CM will follow up with Mr Mcqueen within the next 30 business days  Pt encouraged to return a call to [facility] RN CM prn   [name], RN, BSN, CCM [facility] Telephonic Care Management Care Coordinator Office number [phone] Main [facility] number [phone] Fax number [phone]

## 2021-03-21 ENCOUNTER — Other Ambulatory Visit: Payer: Self-pay

## 2021-03-21 ENCOUNTER — Emergency Department (HOSPITAL_COMMUNITY): Payer: Medicare Other

## 2021-03-21 ENCOUNTER — Encounter (HOSPITAL_COMMUNITY)
Admission: RE | Disposition: A | Discharge status: Still In Hospital | Payer: Self-pay | Source: Ambulatory Visit | Attending: Gastroenterology

## 2021-03-21 ENCOUNTER — Ambulatory Visit (HOSPITAL_COMMUNITY)
Admission: RE | Admit: 2021-03-21 | Discharge: 2021-03-21 | Disposition: A | Discharge status: Still In Hospital | Payer: Medicare Other | Source: Ambulatory Visit | Attending: Gastroenterology | Admitting: Gastroenterology

## 2021-03-21 ENCOUNTER — Emergency Department (HOSPITAL_COMMUNITY)
Admission: EM | Admit: 2021-03-21 | Discharge: 2021-03-21 | Disposition: A | Discharge status: Home / Self Care | Payer: Medicare Other | Attending: Emergency Medicine | Admitting: Emergency Medicine

## 2021-03-21 ENCOUNTER — Encounter (HOSPITAL_COMMUNITY): Payer: Self-pay | Admitting: Gastroenterology

## 2021-03-21 ENCOUNTER — Other Ambulatory Visit: Payer: Self-pay | Admitting: *Deleted

## 2021-03-21 DIAGNOSIS — Z79899 Other long term (current) drug therapy: Secondary | ICD-10-CM | POA: Insufficient documentation

## 2021-03-21 DIAGNOSIS — J449 Chronic obstructive pulmonary disease, unspecified: Secondary | ICD-10-CM | POA: Diagnosis not present

## 2021-03-21 DIAGNOSIS — I10 Essential (primary) hypertension: Secondary | ICD-10-CM | POA: Insufficient documentation

## 2021-03-21 DIAGNOSIS — Z7951 Long term (current) use of inhaled steroids: Secondary | ICD-10-CM | POA: Insufficient documentation

## 2021-03-21 DIAGNOSIS — R000 Tachycardia, unspecified: Secondary | ICD-10-CM | POA: Diagnosis present

## 2021-03-21 DIAGNOSIS — F17210 Nicotine dependence, cigarettes, uncomplicated: Secondary | ICD-10-CM | POA: Insufficient documentation

## 2021-03-21 DIAGNOSIS — Z7982 Long term (current) use of aspirin: Secondary | ICD-10-CM | POA: Diagnosis not present

## 2021-03-21 DIAGNOSIS — J45909 Unspecified asthma, uncomplicated: Secondary | ICD-10-CM | POA: Insufficient documentation

## 2021-03-21 DIAGNOSIS — I471 Supraventricular tachycardia: Secondary | ICD-10-CM | POA: Diagnosis not present

## 2021-03-21 LAB — COMPREHENSIVE METABOLIC PANEL
ALT: 47 U/L — ABNORMAL HIGH (ref 0–44)
AST: 44 U/L — ABNORMAL HIGH (ref 15–41)
Albumin: 4.4 g/dL (ref 3.5–5.0)
Alkaline Phosphatase: 54 U/L (ref 38–126)
Anion gap: 14 (ref 5–15)
BUN: 12 mg/dL (ref 8–23)
CO2: 22 mmol/L (ref 22–32)
Calcium: 9.8 mg/dL (ref 8.9–10.3)
Chloride: 97 mmol/L — ABNORMAL LOW (ref 98–111)
Creatinine, Ser: 1.37 mg/dL — ABNORMAL HIGH (ref 0.61–1.24)
GFR, Estimated: 58 mL/min — ABNORMAL LOW (ref >60)
Glucose, Bld: 103 mg/dL — ABNORMAL HIGH (ref 70–99)
Potassium: 4.3 mmol/L (ref 3.5–5.1)
Sodium: 133 mmol/L — ABNORMAL LOW (ref 135–145)
Total Bilirubin: 1.6 mg/dL — ABNORMAL HIGH (ref 0.3–1.2)
Total Protein: 7.8 g/dL (ref 6.5–8.1)

## 2021-03-21 LAB — BRAIN NATRIURETIC PEPTIDE: B Natriuretic Peptide: 78 pg/mL (ref 0.0–100.0)

## 2021-03-21 LAB — CBC WITH DIFFERENTIAL/PLATELET
Abs Immature Granulocytes: 0.04 10*3/uL (ref 0.00–0.07)
Basophils Absolute: 0.1 10*3/uL (ref 0.0–0.1)
Basophils Relative: 2 %
Eosinophils Absolute: 0.5 10*3/uL (ref 0.0–0.5)
Eosinophils Relative: 9 %
HCT: 50.6 % (ref 39.0–52.0)
Hemoglobin: 17.1 g/dL — ABNORMAL HIGH (ref 13.0–17.0)
Immature Granulocytes: 1 %
Lymphocytes Relative: 32 %
Lymphs Abs: 1.9 10*3/uL (ref 0.7–4.0)
MCH: 29.3 pg (ref 26.0–34.0)
MCHC: 33.8 g/dL (ref 30.0–36.0)
MCV: 86.8 fL (ref 80.0–100.0)
Monocytes Absolute: 1.2 10*3/uL — ABNORMAL HIGH (ref 0.1–1.0)
Monocytes Relative: 20 %
Neutro Abs: 2.2 10*3/uL (ref 1.7–7.7)
Neutrophils Relative %: 36 %
Platelets: 261 10*3/uL (ref 150–400)
RBC: 5.83 MIL/uL — ABNORMAL HIGH (ref 4.22–5.81)
RDW: 13.9 % (ref 11.5–15.5)
WBC: 6 10*3/uL (ref 4.0–10.5)
nRBC: 0 % (ref 0.0–0.2)

## 2021-03-21 LAB — TROPONIN I (HIGH SENSITIVITY)
Troponin I (High Sensitivity): 8 ng/L (ref <18)
Troponin I (High Sensitivity): 7 ng/L (ref <18)

## 2021-03-21 LAB — SARS CORONAVIRUS 2 (TAT 6-24 HRS): SARS Coronavirus 2: NEGATIVE

## 2021-03-21 SURGERY — COLONOSCOPY WITH PROPOFOL
Anesthesia: Monitor Anesthesia Care

## 2021-03-21 MED ORDER — SODIUM CHLORIDE 0.9 % IV BOLUS
1000.0000 mL | Freq: Once | INTRAVENOUS | Status: AC
  Administered 2021-03-21: 1000 mL via INTRAVENOUS

## 2021-03-21 MED ORDER — ALBUTEROL SULFATE HFA 108 (90 BASE) MCG/ACT IN AERS
2.0000 | INHALATION_SPRAY | Freq: Once | RESPIRATORY_TRACT | Status: AC
  Administered 2021-03-21: 2 via RESPIRATORY_TRACT
  Filled 2021-03-21: qty 6.7

## 2021-03-21 MED ORDER — ADENOSINE 6 MG/2ML IV SOLN
6.0000 mg | Freq: Once | INTRAVENOUS | Status: AC
  Administered 2021-03-21: 6 mg via INTRAVENOUS

## 2021-03-21 MED ORDER — MAGNESIUM SULFATE 2 GM/50ML IV SOLN
2.0000 g | Freq: Once | INTRAVENOUS | Status: AC
  Administered 2021-03-21: 2 g via INTRAVENOUS
  Filled 2021-03-21: qty 50

## 2021-03-21 NOTE — ED Triage Notes (Signed)
Pt brought over by ENDO. Pt was due to have a colonoscopy today. Pt's heart rate was elevated  In pre-op so brought to the ED for evaluation. Pt is asymptomatic. EDP at bedside for MSE.

## 2021-03-21 NOTE — Patient Outreach (Signed)
[facility]) Care Management  03/21/2021  YASH CACCIOLA March 26, 1956 [id number]   [facility] Care coordination- cancelled Colonoscopy, Tachycardia, ED   Received outreach from Dr Levon Hedger about pt experiencing tachycardia (170s) with transfer to ED prior to colonoscopy Dr Levon Hedger with unsuccessful outreaches to brother [name]  [facility] RN CM outreach to Mr [name] [place] number was unsuccessful (to voice mail box) Spoke with pt sister Steward Drone and mother Lendell Caprice at the home number To update them of the elevated heartrate, cancelled colonoscopy and ED visit The sister reports Mr [name] went to run an errand and will update him The sister was given GI MD office number and mentioned she will check on patient at the ED    Plan [facility] RN CM will outreach to pt/family within the next 1-3 business days for follow up Pt's family  encouraged to return a call to [facility] RN CM prn  [name], RN, BSN, CCM [facility] Telephonic Care Management Care Coordinator Office number [phone] Mobile number [phone]  Main [facility] number [phone] Fax number [phone]

## 2021-03-21 NOTE — ED Notes (Signed)
Pt arrived from endo dept d/t accelerated HR in 160's but is asymptomatic and expresses no discomfort. EDP Dr. Estell Harpin at bedside. Pt on cardiac monitoring. Adenosine 6mg  given and pt tolerated well. Vitals stable at this time. Will continue to monitor pt.

## 2021-03-21 NOTE — Progress Notes (Signed)
ECG SVT with HR 160-170's.  Pt c/o shortness of breath this AM and took neb treatment at home at 0600.  Pt denies chest discomfort.  BP 160/123, RR 26, O2 sat 100 on RA.  Dr. Levon Hedger and Dr. Johnnette Litter in to assess pt.  Procedure canceled and pt transported to ED  Room #2 via stretcher per MD.

## 2021-03-21 NOTE — ED Provider Notes (Signed)
[facility] EMERGENCY DEPARTMENT Provider Note   CSN: [id number] Arrival date & time: 03/21/21  3716     History Chief Complaint  Patient presents with  . Tachycardia    [name] is a 65 y.o. male.  Patient was sent over from endoscopy for tachycardia.  Patient states he is not having any chest pain or shortness of breath  The history is provided by the patient and medical records. No language interpreter was used.  Palpitations Palpitations quality:  Regular Onset quality:  Sudden Timing:  Constant Progression:  Worsening Chronicity:  New Context: not anxiety   Relieved by:  Nothing Worsened by:  Nothing Associated symptoms: no back pain, no chest pain and no cough        Past Medical History:  Diagnosis Date  . Alcohol abuse   . Allergic rhinitis   . Asthma   . COPD (chronic obstructive pulmonary disease) (HCC)   . Coronary atherosclerosis    Based on chest CT 2012  . Essential hypertension   . History of neutropenia    Also lymphocytosis - followed by Dr. Zigmund Daniel  . Nicotine addiction   . Olecranon bursitis, right elbow     Patient Active Problem List   Diagnosis Date Noted  . Lumbar spinal stenosis 12/16/2020  . Hyponatremia 12/15/2020  . Alcohol dependence (HCC) 12/15/2020  . Tobacco abuse 12/15/2020  . Acute gastroenteritis 11/29/2020  . Solitary pulmonary nodule 10/20/2020  . COPD GOLD 3 likely and still smoking  10/18/2020  . Cigarette smoker 10/18/2020  . Viral gastroenteritis 09/29/2020  . Impaired fasting glucose 05/17/2020  . Screening for colorectal cancer 05/17/2020  . Enlarged prostate 05/17/2020  . GAD (generalized anxiety disorder) 08/01/2016  . Centrilobular emphysema (HCC) 11/01/2015  . Alcohol addiction (HCC) 06/23/2015  . Vitamin D deficiency 03/24/2015  . Coronary atherosclerosis 03/11/2014  . Abnormal EKG 02/17/2014  . Depression with anxiety 02/10/2013  . MALNUTRITION, MILD 11/27/2010  . Nicotine dependence,  cigarettes, with other nicotine-induced disorders 03/30/2008  . Essential hypertension 03/30/2008    Past Surgical History:  Procedure Laterality Date  . COLONOSCOPY    . MULTIPLE TOOTH EXTRACTIONS  June 2012  . OLECRANON BURSECTOMY Right 11/26/2019   Procedure: OLECRANON BURSECTOMY;  Surgeon: Vickki Hearing, MD;  Location: [facility];  Service: Orthopedics;  Laterality: Right;  pt knows to arrive at 6:15       Family History  Problem Relation Age of Onset  . Diabetes Mother   . Hypertension Mother   . Diabetes Father   . Hypertension Sister     Social History   Tobacco Use  . Smoking status: Current Some Day Smoker    Packs/day: 0.50    Years: 38.00    Pack years: 19.00    Types: Cigarettes  . Smokeless tobacco: Never Used  . Tobacco comment: smokes 6 cigarettes per day 10/18/2020  Vaping Use  . Vaping Use: Never used  Substance Use Topics  . Alcohol use: Yes    Alcohol/week: 12.0 standard drinks    Types: 12 Cans of beer per week    Comment: occasionally a beer  . Drug use: No    Home Medications Prior to Admission medications   Medication Sig Start Date End Date Taking? Authorizing Provider  albuterol (VENTOLIN HFA) 108 (90 Base) MCG/ACT inhaler Inhale 2 puffs into the lungs every 6 (six) hours as needed for wheezing or shortness of breath. 11/30/20  Yes Kerri Perches, MD  amLODipine (NORVASC) 5 MG  tablet Take 1 tablet (5 mg total) by mouth daily. 11/29/20  Yes Kerri Perches, MD  aspirin EC 81 MG tablet Take 81 mg by mouth daily. Swallow whole.   Yes Kerri Perches, MD  busPIRone (BUSPAR) 5 MG tablet Take 1 tablet (5 mg total) by mouth 3 (three) times daily. 11/30/20  Yes Kerri Perches, MD  ipratropium-albuterol (DUONEB) 0.5-2.5 (3) MG/3ML SOLN Every 6 hours as needed. Patient taking differently: Take 3 mLs by nebulization every 6 (six) hours as needed (shortness of breath). 11/30/20  Yes Kerri Perches, MD  rosuvastatin (CRESTOR) 5 MG  tablet Take 1 tablet (5 mg total) by mouth daily. 11/30/20 02/28/21 Yes Kerri Perches, MD  diphenoxylate-atropine (LOMOTIL) 2.5-0.025 MG tablet Take 1 tablet by mouth 4 (four) times daily as needed for diarrhea or loose stools. Patient not taking: No sig reported 11/29/20   Kerri Perches, MD  fluticasone (FLOVENT DISKUS) 50 MCG/BLIST diskus inhaler Inhale 1 puff into the lungs 2 (two) times daily. 12/13/20 02/12/21  Kerri Perches, MD    Allergies    Lisinopril  Review of Systems   Review of Systems  Constitutional: Negative for appetite change and fatigue.  HENT: Negative for congestion, ear discharge and sinus pressure.   Eyes: Negative for discharge.  Respiratory: Negative for cough.   Cardiovascular: Positive for palpitations. Negative for chest pain.  Gastrointestinal: Negative for abdominal pain and diarrhea.  Genitourinary: Negative for frequency and hematuria.  Musculoskeletal: Negative for back pain.  Skin: Negative for rash.  Neurological: Negative for seizures and headaches.  Psychiatric/Behavioral: Negative for hallucinations.    Physical Exam Updated Vital Signs BP (!) 136/92   Pulse (!) 101   Temp 98.2 F (36.8 C) (Oral)   Resp 20   Ht 6\' 1"  (1.854 m)   Wt 61.2 kg   SpO2 100%   BMI 17.81 kg/m   Physical Exam Vitals and nursing note reviewed.  Constitutional:      Appearance: He is well-developed.  HENT:     Head: Normocephalic.     Nose: Nose normal.  Eyes:     General: No scleral icterus.    Conjunctiva/sclera: Conjunctivae normal.  Neck:     Thyroid: No thyromegaly.  Cardiovascular:     Rate and Rhythm: Regular rhythm. Tachycardia present.     Heart sounds: No murmur heard. No friction rub. No gallop.   Pulmonary:     Breath sounds: No stridor. No wheezing or rales.  Chest:     Chest wall: No tenderness.  Abdominal:     General: There is no distension.     Tenderness: There is no abdominal tenderness. There is no rebound.   Musculoskeletal:        General: Normal range of motion.     Cervical back: Neck supple.  Lymphadenopathy:     Cervical: No cervical adenopathy.  Skin:    Findings: No erythema or rash.  Neurological:     Mental Status: He is alert and oriented to person, place, and time.     Motor: No abnormal muscle tone.     Coordination: Coordination normal.  Psychiatric:        Behavior: Behavior normal.     ED Results / Procedures / Treatments   Labs (all labs ordered are listed, but only abnormal results are displayed) Labs Reviewed  CBC WITH DIFFERENTIAL/PLATELET - Abnormal; Notable for the following components:      Result Value   RBC 5.83 (*)  Hemoglobin 17.1 (*)    Monocytes Absolute 1.2 (*)    All other components within normal limits  COMPREHENSIVE METABOLIC PANEL - Abnormal; Notable for the following components:   Sodium 133 (*)    Chloride 97 (*)    Glucose, Bld 103 (*)    Creatinine, Ser 1.37 (*)    AST 44 (*)    ALT 47 (*)    Total Bilirubin 1.6 (*)    GFR, Estimated 58 (*)    All other components within normal limits  BRAIN NATRIURETIC PEPTIDE  TROPONIN I (HIGH SENSITIVITY)  TROPONIN I (HIGH SENSITIVITY)    EKG None  Radiology DG Chest Port 1 View  Result Date: 03/21/2021 CLINICAL DATA:  Shortness of breath Tachycardia EXAM: PORTABLE CHEST 1 VIEW COMPARISON:  02/11/2021 FINDINGS: Cardiomediastinal silhouette and pulmonary vasculature are within normal limits. Lungs are hyperexpanded but otherwise clear. Numerous overlying structures limits evaluation of the chest. IMPRESSION: No acute cardiopulmonary process. Electronically Signed   By: Acquanetta Belling M.D.   On: 03/21/2021 10:42    Procedures Procedures   Medications Ordered in ED Medications  adenosine (ADENOCARD) 6 MG/2ML injection 6 mg (6 mg Intravenous Given 03/21/21 0950)  magnesium sulfate IVPB 2 g 50 mL (0 g Intravenous Stopped 03/21/21 1237)  albuterol (VENTOLIN HFA) 108 (90 Base) MCG/ACT inhaler 2 puff  (2 puffs Inhalation Given 03/21/21 1103)  sodium chloride 0.9 % bolus 1,000 mL (1,000 mLs Intravenous New Bag/Given 03/21/21 1325)  CRITICAL CARE Performed by: Bethann Berkshire Total critical care time: 40 minutes Critical care time was exclusive of separately billable procedures and treating other patients. Critical care was necessary to treat or prevent imminent or life-threatening deterioration. Critical care was time spent personally by me on the following activities: development of treatment plan with patient and/or surrogate as well as nursing, discussions with consultants, evaluation of patient's response to treatment, examination of patient, obtaining history from patient or surrogate, ordering and performing treatments and interventions, ordering and review of laboratory studies, ordering and review of radiographic studies, pulse oximetry and re-evaluation of patient's condition.  ED Course  I have reviewed the triage vital signs and the nursing notes.  Pertinent labs & imaging results that were available during my care of the patient were reviewed by me and considered in my medical decision making (see chart for details). Patient was given adenosine for SVT.  He converted.   MDM Rules/Calculators/A&P                          Patient with SVT and dehydration.  He will follow-up with cardiology Final Clinical Impression(s) / ED Diagnoses Final diagnoses:  SVT (supraventricular tachycardia) (HCC)    Rx / DC Orders ED Discharge Orders    None       Bethann Berkshire, MD 03/24/21 1425

## 2021-03-21 NOTE — Progress Notes (Addendum)
Procedure was canceled today as the patient was found to have severe tachycardia with heart rate in the 170s.  He was also endorsing having shortness of breath.  Blood pressure was stable.  He denies any other complaints of chest pain or any other symptom.  Patient was wheeled to the ER for further evaluation.  I tried reaching his brother to discuss this but he was not in the waiting area and did not answer my phone call.  We will try to reach him again later today.

## 2021-03-21 NOTE — ED Notes (Signed)
Dr Zammit at bedside. 

## 2021-03-21 NOTE — Discharge Instructions (Addendum)
Follow-up with the cardiologist in the next couple weeks and return if any problem

## 2021-03-22 ENCOUNTER — Other Ambulatory Visit: Payer: Self-pay | Admitting: *Deleted

## 2021-03-22 NOTE — Patient Outreach (Signed)
[facility]) Care Management  03/22/2021  [name] 07-Aug-1956 [id number]  [facility] Outreach to Complex care patient  Mr CLEMENTS TORO as referred to [facility] on 09/04/19 for MD referral from Dr [name] Referral Reason:pt has medicare but can't afford medications does not have drug coverage- has emphysema -Resolved medicare coverage issue in October 2020 Insurance: NextGen Medicare/medicaid of [place] family planningeffective 11/7/2018SSI disability [employer] medicare effective 10/06/19   Admissions/ ED visits 02/11/21 ED visitshortness of breath (sob)/COPD exacerbation with discharge home 12/15/20 -12/17/20 admission for Hyponatremia, right flank pain/right hip pain/back pain/sever lumbar spinal stenosis/alcohol dependence/THC use/Tobacco abuse/COPD/alcoholic peripheral neuropathy  Outreach to thehomenumber [phone]  Patient is able to verify HIPAA [employer] Portability and Accountability Act) identifiers Reviewed and addressed the purpose of the follow up call with the patient  Consent: [facility] ([facility] Customer service manager) RN CM reviewed [facility] services with patient. Patient gave verbal consent for services.  Follow up on 03/21/21 ED visit and cancelled colonoscopy Mr Weekes reports he is doing well today except he remains drowsy from his administration of his GI prep and the medicine given to him by GI and ED on 03/21/21 He is resting in bed He denies a headache today, chest pain or shortness of breath Disappointed he was not able to complete colonoscopy [facility] RN CM answered questions for him related to rescheduling the colonoscopy after he is cleared by cardiology He voiced understanding [facility] RN CM reviewed his after summary sheet with him and referred him to it for reference He has resumed his medicines as ordered but has not made a follow up appointment  Transition of care services noted to be completed by primary care MD office staff- Dr Berenice Primas   Transition of Care will be completed by primary care provider office who will refer to [facility] care management if needed.   [facility] RN CM assisted with a conference call to Dr Wyline Mood cardiology office as recommended at ED discharge He spoke with Dahlia Client who confirms he is generally seen by Dr Diona Browner An appointment was scheduled for Wednesday, April 12 2021 Dr Diona Browner at 9:20 am [phone] He is aware the office will outreach to remind him 1-3 days prior to the date He voiced being aware of the location of the cardiology office in [facility]   [facility] RN CM will outreach to pt/family within the next 30 business days  Pt/ family  encouraged to return a call to [facility] RN CM prn Goals Addressed              This Visit's Progress     Patient Stated   .  [facility]) Make and Keep All Appointments (pt-stated)   On track     Timeframe:  Long-Range Goal Priority:  Medium Start Date:           12/12/20                  Expected End Date:          04/14/21             Follow Up Date 04/06/21     - ask family or friend for a ride - keep a calendar with appointment dates       Notes:  03/22/21 attended 03/21/21 colonoscopy but cancelled related to elevated heart rate and sent to ED. Dx SVT palpations, discharged home Disappointed he was not able to complete colonoscopy 03/20/21 attended covid testing and pre op colonoscopy 03/20/21  appointments Has preparation items for pending 03/21/21 colonoscopy 03/13/21 agreed to coordination of covid test on 03/20/21 & colonoscopy for 03/21/21 with Larita Fife, GI RN & brother [name] 03/10/21 agreed to the attempt to assist with re scheduling colonoscopy with brother [name] help LVM at GI office  02/24/21 Still has not completed colonoscopy Noted not scheduled for any further MD visits. Brother ED Still at the home caring for pt mother and available to assist pt 12/12/20 calls to GI and pcp to assist with rescheduling of missed 11/16/20 colonoscopy    .  [facility]) Maintain My  Quality of Life (pt-stated)   On track     Follow Up Date 04/06/21    - discuss my treatment options with the doctor or nurse - do one enjoyable thing every day - make shared treatment decisions with doctor - spend time outdoors at least 3 times a week     Notes: 03/22/21 Denies concerns at this time 03/20/21 attended covid testing and pre op colonoscopy 03/20/21 appointments Has preparation items for pending 03/21/21 colonoscopy 03/13/21 agreed to coordination of covid test on 03/20/21 & colonoscopy for 03/21/21 with Larita Fife, GI RN & brother [name] 03/10/21 agreed to the attempt to assist with re scheduling colonoscopy with brother [name] help LVM at GI office  02/24/21 Pt continues to smoke, drink, spend time with family and friends and complete his lawn care (enjoyable tasks)     .  [facility]) Track and Manage My Symptoms (pt-stated)   On track     Follow Up Date 04/06/21    - eliminate symptom triggers at home - follow rescue plan if symptoms flare-up - keep follow-up appointments      Notes: 03/22/21 denies worsening symptoms 02/24/21 Continue with triggers of COPD to include smoking, lawn care Has inhalers, nebulizer Recent completed antibiotics after 02/11/21 ED visit States he will start wearing mask with lawn care Has not completed colonoscopy and not noted to have any scheduled MD visits       Teana Lindahl L. Noelle Penner, RN, BSN, CCM [facility] Telephonic Care Management Care Coordinator Office number [phone] Main [facility] number [phone] Fax number [phone]

## 2021-04-04 ENCOUNTER — Other Ambulatory Visit: Payer: Self-pay | Admitting: *Deleted

## 2021-04-04 NOTE — Patient Outreach (Signed)
[facility]) Care Management  04/04/2021  EMRIK ERHARD 1956-07-16 [id number]   [facility] unsuccessful Outreach to Complex care patient  Mr WILLMER FELLERS as referred to [facility] on 09/04/19 for MD referral from Dr [name] Referral Reason:pt has medicare but can't afford medications does not have drug coverage- has emphysema -Resolved medicare coverage issue in October 2020 Insurance: NextGen Medicare/medicaid of [place] family planningeffective 11/7/2018SSI disability [employer] medicare effective 10/06/19   Admissions/ ED visits 02/11/21 ED visitshortness of breath (sob)/COPD exacerbation with discharge home 12/15/20 -12/17/20 admission for Hyponatremia, right flank pain/right hip pain/back pain/sever lumbar spinal stenosis/alcohol dependence/THC use/Tobacco abuse/COPD/alcoholic peripheral neuropathy  Outreach to themobilenumber [phone] No answer No voice mail box setup  Outreach to home number [phone] No answer. [facility] RN CM left HIPAA HIPAA [facility] Portability and Accountability Act) compliant voicemail message along with CM's contact info.    Plans [facility] RN CM will outreach to pt/family within the next 30 business days  Pt/ familyencouraged to return a call to [facility] RN CM prn  Obelia Bonello L. Noelle Penner, RN, BSN, CCM [facility] Telephonic Care Management Care Coordinator Office number [phone] Main [facility] number [phone] Fax number [phone]

## 2021-04-06 ENCOUNTER — Ambulatory Visit: Payer: Medicare Other | Admitting: *Deleted

## 2021-04-11 ENCOUNTER — Encounter: Payer: Self-pay | Admitting: Cardiology

## 2021-04-11 NOTE — Progress Notes (Signed)
Cardiology Office Note  Date: 04/12/2021   ID: [name], DOB 01/02/1956, MRN [id number]  PCP:  Kerri Perches, MD  Cardiologist:  Nona Dell, MD Electrophysiologist:  None   Chief Complaint  Patient presents with  . Cardiac follow-up    History of Present Illness: [name] is a 65 y.o. male last seen in July 2021.  He presents to the office for follow-up visit after ER encounter in early April with tachycardia.  He had actually presented to undergo colonoscopy and was found to have elevated heart rate.  ECG from April 5 showed SVT at 166 bpm, reportedly converted to sinus rhythm with IV adenosine in the ER.  He tells me that he has not aware of any sense of palpitations, did not feel unusual and he is diagnosed with SVT recently.  He is not on any AV nodal blockers at this time.  I reviewed his medications, we discussed addition of Toprol-XL with up-titration as needed.  I also mentioned the possibility of EP referral for discussion of ablation if breakthrough arrhythmia continues despite medications.  Past Medical History:  Diagnosis Date  . Alcohol abuse   . Allergic rhinitis   . Asthma   . COPD (chronic obstructive pulmonary disease) (HCC)   . Coronary atherosclerosis    Based on chest CT 2012  . Essential hypertension   . History of neutropenia    Also lymphocytosis - followed by Dr. Zigmund Daniel  . Nicotine addiction   . Olecranon bursitis, right elbow   . PSVT (paroxysmal supraventricular tachycardia) (HCC)    Document in April 2022    Past Surgical History:  Procedure Laterality Date  . COLONOSCOPY    . MULTIPLE TOOTH EXTRACTIONS  June 2012  . OLECRANON BURSECTOMY Right 11/26/2019   Procedure: OLECRANON BURSECTOMY;  Surgeon: Vickki Hearing, MD;  Location: [facility];  Service: Orthopedics;  Laterality: Right;  pt knows to arrive at 6:15    Current Outpatient Medications  Medication Sig Dispense Refill  . albuterol (VENTOLIN HFA) 108  (90 Base) MCG/ACT inhaler Inhale 2 puffs into the lungs every 6 (six) hours as needed for wheezing or shortness of breath. 16 g 2  . amLODipine (NORVASC) 5 MG tablet Take 1 tablet (5 mg total) by mouth daily. 90 tablet 1  . aspirin EC 81 MG tablet Take 81 mg by mouth daily. Swallow whole.    . busPIRone (BUSPAR) 5 MG tablet Take 1 tablet (5 mg total) by mouth 3 (three) times daily. 270 tablet 3  . diphenoxylate-atropine (LOMOTIL) 2.5-0.025 MG tablet Take 1 tablet by mouth 4 (four) times daily as needed for diarrhea or loose stools. 20 tablet 0  . ipratropium-albuterol (DUONEB) 0.5-2.5 (3) MG/3ML SOLN Every 6 hours as needed. (Patient taking differently: Take 3 mLs by nebulization every 6 (six) hours as needed (shortness of breath).) 90 mL 6  . metoprolol succinate (TOPROL XL) 25 MG 24 hr tablet Take 1 tablet (25 mg total) by mouth daily. 90 tablet 3  . rosuvastatin (CRESTOR) 5 MG tablet Take 1 tablet (5 mg total) by mouth daily. 90 tablet 3   No current facility-administered medications for this visit.   Allergies:  Lisinopril   ROS: No sudden dizziness or syncope.  Physical Exam: VS:  BP (!) 160/80   Pulse 81   Ht 5\' 11"  (1.803 m)   Wt 133 lb 3.2 oz (60.4 kg)   SpO2 98%   BMI 18.58 kg/m , BMI Body mass  index is 18.58 kg/m.  Wt Readings from Last 3 Encounters:  04/12/21 133 lb 3.2 oz (60.4 kg)  03/21/21 135 lb (61.2 kg)  03/21/21 135 lb (61.2 kg)    General: Patient appears comfortable at rest. HEENT: Conjunctiva and lids normal, wearing a mask. Neck: Supple, no elevated JVP or carotid bruits, no thyromegaly. Lungs: Clear to auscultation, nonlabored breathing at rest. Cardiac: Regular rate and rhythm, no S3 or significant systolic murmur, no pericardial rub. Extremities: No pitting edema.  ECG:  An ECG dated 03/21/2021 was personally reviewed today and demonstrated:  SVT and 166 bpm, nonspecific ST changes.  Recent Labwork: 12/15/2020: TSH 2.648 12/16/2020: Magnesium  2.0 03/21/2021: ALT 47; AST 44; B Natriuretic Peptide 78.0; BUN 12; Creatinine, Ser 1.37; Hemoglobin 17.1; Platelets 261; Potassium 4.3; Sodium 133     Component Value Date/Time   CHOL 201 (H) 11/29/2020 1116   TRIG 111 11/29/2020 1116   HDL 101 11/29/2020 1116   CHOLHDL 2.0 11/29/2020 1116   CHOLHDL 1.9 05/17/2020 1119   VLDL 21 11/22/2016 0955   LDLCALC 81 11/29/2020 1116   LDLCALC 84 05/17/2020 1119    Other Studies Reviewed Today:  Echocardiogram 06/13/2020: 1. Left ventricular ejection fraction, by estimation, is 55 to 60%. The  left ventricle has normal function. The left ventricle has no regional  wall motion abnormalities. Left ventricular diastolic parameters are  consistent with Grade I diastolic  dysfunction (impaired relaxation).  2. Right ventricular systolic function is normal. The right ventricular  size is normal.  3. The mitral valve is normal in structure. No evidence of mitral valve  regurgitation. No evidence of mitral stenosis.  4. The aortic valve is tricuspid. Aortic valve regurgitation is not  visualized. No aortic stenosis is present.  5. The inferior vena cava is normal in size with greater than 50%  respiratory variability, suggesting right atrial pressure of 3 mmHg.   Assessment and Plan:  1.  PSVT, recently documented and resolved with adenosine.  He is not aware of any sense of palpitations so rhythm frequency is not entirely clear at this point.  Plan to start Toprol-XL 25 mg daily and up-titrate as needed.  If he continues to have breakthrough arrhythmias, EP consultation could be considered for discussion of ablation.  2.  Essential hypertension, on Norvasc.  3.  Hyperlipidemia, on Crestor with recent LDL 84.  4.  Coronary artery calcification by CT imaging, asymptomatic without angina.  Continue aspirin and statin.  Medication Adjustments/Labs and Tests Ordered: Current medicines are reviewed at length with the patient today.  Concerns  regarding medicines are outlined above.   Tests Ordered: No orders of the defined types were placed in this encounter.   Medication Changes: Meds ordered this encounter  Medications  . metoprolol succinate (TOPROL XL) 25 MG 24 hr tablet    Sig: Take 1 tablet (25 mg total) by mouth daily.    Dispense:  90 tablet    Refill:  3    Disposition:  Follow up 1 month.  Signed, Jonelle Sidle, MD, [facility] 04/12/2021 9:44 AM    [facility] [address], [place], [place] [address] Phone: [phone]; Fax: [phone]

## 2021-04-12 ENCOUNTER — Encounter: Payer: Self-pay | Admitting: Cardiology

## 2021-04-12 ENCOUNTER — Other Ambulatory Visit: Payer: Self-pay | Admitting: Family Medicine

## 2021-04-12 ENCOUNTER — Other Ambulatory Visit: Payer: Self-pay

## 2021-04-12 ENCOUNTER — Ambulatory Visit (INDEPENDENT_AMBULATORY_CARE_PROVIDER_SITE_OTHER): Payer: Medicare Other | Admitting: Cardiology

## 2021-04-12 VITALS — BP 160/80 | HR 81 | Ht 71.0 in | Wt 133.2 lb

## 2021-04-12 DIAGNOSIS — I2510 Atherosclerotic heart disease of native coronary artery without angina pectoris: Secondary | ICD-10-CM

## 2021-04-12 DIAGNOSIS — I471 Supraventricular tachycardia: Secondary | ICD-10-CM

## 2021-04-12 DIAGNOSIS — I10 Essential (primary) hypertension: Secondary | ICD-10-CM | POA: Diagnosis not present

## 2021-04-12 DIAGNOSIS — J432 Centrilobular emphysema: Secondary | ICD-10-CM

## 2021-04-12 MED ORDER — METOPROLOL SUCCINATE ER 25 MG PO TB24: 25.0000 mg | ORAL_TABLET | Freq: Every day | ORAL | 3 refills | Status: DC

## 2021-04-12 NOTE — Patient Instructions (Signed)
Medication Instructions:  Your physician has recommended you make the following change in your medication:  START Toprol XL 25 mg tablets daily  *If you need a refill on your cardiac medications before your next appointment, please call your pharmacy*   Lab Work: None  If you have labs (blood work) drawn today and your tests are completely normal, you will receive your results only by: [name] MyChart Message (if you have MyChart) OR . A paper copy in the mail If you have any lab test that is abnormal or we need to change your treatment, we will call you to review the results.   Testing/Procedures: None   Follow-Up: At [facility], you and your health needs are our priority.  As part of our continuing mission to provide you with exceptional heart care, we have created designated Provider Care Teams.  These Care Teams include your primary Cardiologist (physician) and Advanced Practice Providers (APPs -  Physician Assistants and Nurse Practitioners) who all work together to provide you with the care you need, when you need it.  We recommend signing up for the patient portal called "MyChart".  Sign up information is provided on this After Visit Summary.  MyChart is used to connect with patients for Virtual Visits (Telemedicine).  Patients are able to view lab/test results, encounter notes, upcoming appointments, etc.  Non-urgent messages can be sent to your provider as well.   To learn more about what you can do with MyChart, go to [email or web address].    Your next appointment:   10 month(s)  The format for your next appointment:   In Person  Provider:   Randall An, PA-C   Other Instructions

## 2021-04-14 ENCOUNTER — Other Ambulatory Visit: Payer: Self-pay

## 2021-04-14 ENCOUNTER — Other Ambulatory Visit: Payer: Self-pay | Admitting: *Deleted

## 2021-04-14 NOTE — Patient Outreach (Addendum)
[facility]) Care Management  04/14/2021  [name] 09-09-1956 [id number]   [facility] Outreach to Complex care patient  Mr BRYCEN BEAN as referred to [facility] on 09/04/19 for MD referral from Dr [name] Referral Reason:pt has medicare but can't afford medications does not have drug coverage- has emphysema -Resolved medicare coverage issue in October 2020 Insurance: [employer] Medicare[place]icaid [place] family planning[place] 11/7/2018SS[employer] disability [employer] medicare for drug coverage effective 10/06/19   Admissions/ ED visits 03/21/21 ED visit for Tachycardia prior to a scheduled colonoscopy dx SVT (Supraventricular tachycardia) 02/11/21 ED visitshortness of breath (sob)/COPD exacerbation with discharge home 12/15/20 -12/17/20 admission for Hyponatremia, right flank pain/right hip pain/back pain/sever lumbar spinal stenosis/alcohol dependence/THC use/Tobacco abuse/COPD/alcoholic peripheral neuropathy  Outreach to home number [phone] Patient is able to verify HIPAA [facility] Portability and Accountability Act) identifiers Reviewed and addressed the purpose of the follow up call with the patient  Consent: [facility] ([facility] Customer service manager) RN CM reviewed [facility] services with patient. Patient gave verbal consent for services.  [facility] follow up assessment  Mr Hillery confirms he did attend his cardiology appointment on 04/12/21  He confirms he filled his prescriptions and is taking medications as ordered  He reports he is presently providing caregiver services to his mother as his brother has briefly returned to [place] and his sister has returned to work   He reports having an updated mobile phone but with the same number  He denies medical concerns today related to his cardiac and pulmonary systems  Discussed future attempts to re schedule for his colonoscopy after his brother returns from [place]  Past Medical History:  Diagnosis Date  . Alcohol  abuse   . Allergic rhinitis   . Asthma   . COPD (chronic obstructive pulmonary disease) (HCC)   . Coronary atherosclerosis    Based on chest CT 2012  . Essential hypertension   . History of neutropenia    Also lymphocytosis - followed by Dr. Zigmund Daniel  . Nicotine addiction   . Olecranon bursitis, right elbow   . PSVT (paroxysmal supraventricular tachycardia) [facility])    Document in April 2022      Plans  Patient agrees to care plan and follow up with in the next 30-45 business days Pt encouraged to return a call to [facility] RN CM prn Goals Addressed              This Visit's Progress     Patient Stated   .  [facility]) Make and Keep All Appointments (pt-stated)   On track     Timeframe:  Long-Range Goal Priority:  Medium Start Date:           12/12/20                  Expected End Date:          04/14/21             Follow Up Date 05/19/21     - ask family or friend for a ride - keep a calendar with appointment dates     Notes:  04/14/21 has kept appointments 03/22/21 attended 03/21/21 colonoscopy but cancelled related to elevated heart rate and sent to ED. Dx SVT palpations, discharged home Disappointed he was not able to complete colonoscopy 03/20/21 attended covid testing and pre op colonoscopy 03/20/21 appointments Has preparation items for pending 03/21/21 colonoscopy 03/13/21 agreed to coordination of covid test on 03/20/21 & colonoscopy for 03/21/21 with Larita Fife, GI  RN & brother [name] 03/10/21 agreed to the attempt to assist with re scheduling colonoscopy with brother [name] help LVM at GI office  02/24/21 Still has not completed colonoscopy Noted not scheduled for any further MD visits. Brother ED Still at the home caring for pt mother and available to assist pt 12/12/20 calls to GI and pcp to assist with rescheduling of missed 11/16/20 colonoscopy    .  [facility]) Maintain My Quality of Life (pt-stated)   On track     Follow Up Date 05/19/21    - discuss my treatment options with the doctor or  nurse - do one enjoyable thing every day - make shared treatment decisions with doctor - spend time outdoors at least 3 times a week   Notes:  04/14/21 has seen cardiologist and is taking medications as ordered  Taking care of his mother Doing enjoyable activities (drinking , smoking, lawn care) 03/22/21 Denies concerns at this time 03/20/21 attended covid testing and pre op colonoscopy 03/20/21 appointments Has preparation items for pending 03/21/21 colonoscopy 03/13/21 agreed to coordination of covid test on 03/20/21 & colonoscopy for 03/21/21 with Larita Fife, GI RN & brother [name] 03/10/21 agreed to the attempt to assist with re scheduling colonoscopy with brother [name] help LVM at GI office  02/24/21 Pt continues to smoke, drink, spend time with family and friends and complete his lawn care (enjoyable tasks)     .  [facility]) Stop or Cut Down Tobacco Use (pt-stated)   Not on track     Follow Up Date 05/19/21    - cut down amount of tobacco product used (chew, cigars)     Notes: 04/14/21 continues to smoke Not progressing toward goal 03/20/21 continues to smoke 02/24/21 continues to smoke but "just about to quit"  Voiced interest to get on [place] with assist of primary care provider (PCP) The rubber band therapy to encourage smoke cessation did not work  "It's a habit that is hard to break. I have been smoking since I was [age] years old"      .  [facility]) Track and Manage My Symptoms (pt-stated)   On track     Follow Up Date 05/19/21    - eliminate symptom triggers at home - follow rescue plan if symptoms flare-up - keep follow-up appointments     Notes:  04/14/21 denies concerns with cardiac and pulmonary illnesses, taking medicines, keeping appointments 03/22/21 denies worsening symptoms 02/24/21 Continue with triggers of COPD to include smoking, lawn care Has inhalers, nebulizer Recent completed antibiotics after 02/11/21 ED visit States he will start wearing mask with lawn care Has not completed colonoscopy  and not noted to have any scheduled MD visits         Shaylon Gillean L. Noelle Penner, RN, BSN, CCM [facility] Telephonic Care Management Care Coordinator Office number [phone] [facility] number [phone] Fax number [phone]

## 2021-05-12 ENCOUNTER — Other Ambulatory Visit: Payer: Self-pay

## 2021-05-12 ENCOUNTER — Ambulatory Visit (INDEPENDENT_AMBULATORY_CARE_PROVIDER_SITE_OTHER): Payer: Medicare Other | Admitting: Student

## 2021-05-12 ENCOUNTER — Encounter: Payer: Self-pay | Admitting: Student

## 2021-05-12 VITALS — BP 140/80 | HR 144 | Ht 71.0 in | Wt 139.0 lb

## 2021-05-12 DIAGNOSIS — E785 Hyperlipidemia, unspecified: Secondary | ICD-10-CM | POA: Diagnosis not present

## 2021-05-12 DIAGNOSIS — I471 Supraventricular tachycardia: Secondary | ICD-10-CM

## 2021-05-12 DIAGNOSIS — I10 Essential (primary) hypertension: Secondary | ICD-10-CM

## 2021-05-12 DIAGNOSIS — I2510 Atherosclerotic heart disease of native coronary artery without angina pectoris: Secondary | ICD-10-CM | POA: Diagnosis not present

## 2021-05-12 MED ORDER — METOPROLOL SUCCINATE ER 50 MG PO TB24: 50.0000 mg | ORAL_TABLET | Freq: Every day | ORAL | 3 refills | Status: DC

## 2021-05-12 NOTE — Patient Instructions (Signed)
Medication Instructions:  Your physician has recommended you make the following change in your medication:  INCREASE Toprol XL to 50 mg tablets daily  *If you need a refill on your cardiac medications before your next appointment, please call your pharmacy*   Lab Work: None  If you have labs (blood work) drawn today and your tests are completely normal, you will receive your results only by: [name] MyChart Message (if you have MyChart) OR . A paper copy in the mail If you have any lab test that is abnormal or we need to change your treatment, we will call you to review the results.   Testing/Procedures: None   Follow-Up: At [facility], you and your health needs are our priority.  As part of our continuing mission to provide you with exceptional heart care, we have created designated Provider Care Teams.  These Care Teams include your primary Cardiologist (physician) and Advanced Practice Providers (APPs -  Physician Assistants and Nurse Practitioners) who all work together to provide you with the care you need, when you need it.  We recommend signing up for the patient portal called "MyChart".  Sign up information is provided on this After Visit Summary.  MyChart is used to connect with patients for Virtual Visits (Telemedicine).  Patients are able to view lab/test results, encounter notes, upcoming appointments, etc.  Non-urgent messages can be sent to your provider as well.   To learn more about what you can do with MyChart, go to [email or web address].    Your next appointment:   2 month(s)  The format for your next appointment:   In Person  Provider:   Nona Dell, MD or Randall An, PA_C   Other Instructions

## 2021-05-12 NOTE — Progress Notes (Signed)
Cardiology Office Note    Date:  05/12/2021   ID:  [name], DOB Mar 14, 1956, MRN [id number]  PCP:  Kerri Perches, MD  Cardiologist: Nona Dell, MD    Chief Complaint  Patient presents with  . Follow-up    1 month visit    History of Present Illness:    [name] is a 65 y.o. male with past medical history of SVT, coronary calcification by CT, HTN and COPD who presents to the office today for 1 month follow-up.  He was last examined by Dr. Diona [name] in 03/2021 and had recently been evaluated in the ED for SVT. He had presented for colonoscopy and was found to be tachycardic but was asymptomatic. He did convert to normal sinus rhythm with IV adenosine and denied any palpitations at the time of his visit. He was started on Toprol-XL 25 mg daily with plans to uptitrate as needed and if he continued to have breakthrough arrhythmias, would consider EP evaluation for possible ablation.  In talking with the patient today, he reports feeling well from a cardiac perspective since his last office visit. He denies any recent chest pain or dyspnea on exertion.  No reported palpitations, dizziness or presyncope. No orthopnea, PND or lower extremity edema. He remains active at baseline and performs yard work routinely without symptoms.  His heart rate was elevated in the 140's upon arrival today but he reported being in a verbal altercation with a family member prior to his visit. Heart rate was still elevated at the time of examination but had drastically improved into the 80's prior to an EKG being obtained.  Past Medical History:  Diagnosis Date  . Alcohol abuse   . Allergic rhinitis   . Asthma   . COPD (chronic obstructive pulmonary disease) (HCC)   . Coronary atherosclerosis    Based on chest CT 2012  . Essential hypertension   . History of neutropenia    Also lymphocytosis - followed by Dr. Zigmund Daniel  . Nicotine addiction   . Olecranon bursitis, right elbow   .  PSVT (paroxysmal supraventricular tachycardia) (HCC)    Document in April 2022    Past Surgical History:  Procedure Laterality Date  . COLONOSCOPY    . MULTIPLE TOOTH EXTRACTIONS  June 2012  . OLECRANON BURSECTOMY Right 11/26/2019   Procedure: OLECRANON BURSECTOMY;  Surgeon: Nicholas Hearing, MD;  Location: [facility];  Service: Orthopedics;  Laterality: Right;  pt knows to arrive at 6:15    Current Medications: Outpatient Medications Prior to Visit  Medication Sig Dispense Refill  . albuterol (VENTOLIN HFA) 108 (90 Base) MCG/ACT inhaler Inhale 2 puffs into the lungs every 6 (six) hours as needed for wheezing or shortness of breath. 16 g 2  . amLODipine (NORVASC) 5 MG tablet Take 1 tablet (5 mg total) by mouth daily. 90 tablet 1  . aspirin EC 81 MG tablet Take 81 mg by mouth daily. Swallow whole.    . busPIRone (BUSPAR) 5 MG tablet Take 1 tablet (5 mg total) by mouth 3 (three) times daily. 270 tablet 3  . diphenoxylate-atropine (LOMOTIL) 2.5-0.025 MG tablet Take 1 tablet by mouth 4 (four) times daily as needed for diarrhea or loose stools. 20 tablet 0  . ipratropium-albuterol (DUONEB) 0.5-2.5 (3) MG/3ML SOLN INHALE 1 VIAL VIA NEBULIZER EVERY SIX HOURS AS NEEDED FOR WHEEZING/ SHORTNESS OF BREATH 90 mL 0  . metoprolol succinate (TOPROL XL) 25 MG 24 hr tablet Take 1 tablet (25 mg  total) by mouth daily. 90 tablet 3  . rosuvastatin (CRESTOR) 5 MG tablet Take 1 tablet (5 mg total) by mouth daily. 90 tablet 3   No facility-administered medications prior to visit.     Allergies:   Lisinopril   Social History   Socioeconomic History  . Marital status: Single    Spouse name: none  . Number of children: 0  . Years of education: Not on file  . Highest education level: Not on file  Occupational History  . 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 Smoking status: Current Some Day Smoker    Packs/day: 0.50     Years: 38.00    Pack years: 19.00    Types: Cigarettes  . Smokeless tobacco: Never Used  . Tobacco comment: smokes 6 cigarettes per day 10/18/2020  Vaping Use  . Vaping Use: Never used  Substance and Sexual Activity  . Alcohol use: Yes    Alcohol/week: 12.0 standard drinks    Types: 12 Cans of beer per week    Comment: occasionally a beer  . Drug use: No  . Sexual activity: Not on file  Other Topics Concern  . Not on file  Social History Narrative   [name] is at 65 year old single retired male on disability who lives with his mother and sister. 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 Worried About Programme researcher, broadcasting/film/video in the Last Year: Never true  . Ran Out of Food in the Last Year: Never true  Transportation Needs: No Transportation Needs  . Lack of Transportation (Medical): No  . Lack of Transportation (Non-Medical): No  Physical Activity: Not on file  Stress: No Stress Concern Present  . Feeling of Stress : Only a little  Social Connections: Moderately Integrated  . Frequency of Communication with Friends and Family: More than three times a week  . Frequency of Social Gatherings with Friends and Family: More than three times a week  . Attends Religious Services: 1 to 4 times per year  . Active Member of Clubs or Organizations: No  . Attends Banker Meetings: 1 to 4 times per year  . Marital Status: Never married     Family History:  The patient's family history includes Diabetes in his father and mother; Hypertension in his mother and sister.   Review of Systems:    Please see the history of present illness.     All other systems reviewed and are otherwise negative except as  noted above.   Physical Exam:    VS:  BP 140/80   Pulse (!) 144   Ht 5\' 11"  (1.803 m)   Wt 139 lb (63 kg)   SpO2 99%   BMI 19.39 kg/m    General: Well developed, well nourished,male appearing in no acute distress. Head: Normocephalic, atraumatic. Neck: No carotid bruits. JVD not elevated.  Lungs: Respirations regular and unlabored, without wheezes or rales.  Heart: Tachycardiac, regular rhythm. No S3 or S4.  No murmur, no rubs, or gallops appreciated. Abdomen: Appears non-distended. No obvious abdominal masses. Msk:  Strength and tone appear normal for age. No obvious joint deformities or effusions. Extremities: No clubbing or cyanosis. No pitting edema.  Distal pedal pulses are 2+ bilaterally. Neuro: Alert and oriented X 3. Moves all extremities spontaneously. No focal deficits noted. Psych:  Responds to questions appropriately with a normal affect. Skin: No rashes or lesions noted  Wt Readings from Last 3 Encounters:  05/12/21 139 lb (63 kg)  04/12/21 133 lb 3.2 oz (60.4 kg)  03/21/21 135 lb (61.2 kg)     Studies/Labs Reviewed:   EKG:  EKG is ordered today.  The ekg ordered today demonstrates NSR, HR 85 with no acute ST abnormalities.   Recent Labs: 12/15/2020: TSH 2.648 12/16/2020: Magnesium 2.0 03/21/2021: ALT 47; B Natriuretic Peptide 78.0; BUN 12; Creatinine, Ser 1.37; Hemoglobin 17.1; Platelets 261; Potassium 4.3; Sodium 133   Lipid Panel    Component Value Date/Time   CHOL 201 (H) 11/29/2020 1116   TRIG 111 11/29/2020 1116   HDL 101 11/29/2020 1116   CHOLHDL 2.0 11/29/2020 1116   CHOLHDL 1.9 05/17/2020 1119   VLDL 21 11/22/2016 0955   LDLCALC 81 11/29/2020 1116   LDLCALC 84 05/17/2020 1119    Additional studies/ records that were reviewed today include:   Echocardiogram: 06/13/2020 IMPRESSIONS    1. Left ventricular ejection fraction, by estimation, is 55 to 60%. The  left ventricle has normal function. The left ventricle has no regional  wall motion  abnormalities. Left ventricular diastolic parameters are  consistent with Grade I diastolic  dysfunction (impaired relaxation).  2. Right ventricular systolic function is normal. The right ventricular  size is normal.  3. The mitral valve is normal in structure. No evidence of mitral valve  regurgitation. No evidence of mitral stenosis.  4. The aortic valve is tricuspid. Aortic valve regurgitation is not  visualized. No aortic stenosis is present.  5. The inferior vena cava is normal in size with greater than 50%  respiratory variability, suggesting right atrial pressure of 3 mmHg.   Assessment:    1. PSVT (paroxysmal supraventricular tachycardia) (HCC)   2. Coronary artery calcification seen on CT scan   3. Essential hypertension   4. Hyperlipidemia LDL goal <70      Plan:   In order of problems listed above:  1. SVT - Was diagnosed last month and he converted to NSR with Adenosine. His HR was elevated during today's visit and he felt this was secondary to being in a verbal altercation earlier in the day. Suspect recurrent SVT but he did convert back to NSR prior to his EKG being obtained. Reviewed with Dr. Diona BrownerMcDowell (DOD) and will titrate Toprol-XL from 25mg  daily to 50mg  daily. If he continues to have recurrence, would refer to EP for consideration of ablation but he is asymptomatic at this time. Reviewed the importance of limiting caffeine and alcohol consumption.   2. Coronary Calcification by CT - Noted on prior imaging. Echocardiogram last year showed a preserved EF with no regional WMA. Medical management has been pursued and he has not underwent stress testing due to being active at baseline and denying any anginal symptoms.  - Continue ASA 81mg  daily and Crestor 5mg  daily.   3. HTN - BP is slightly elevated at 140/80 during today's visit. Will continue Amlodipine 5mg  daily and titrate Toprol-XL from 25mg  daily to 50mg  daily as outlined above.   4. HLD - Followed by  PCP. LDL was at 81 in 11/2020. He remains on Crestor 5mg  daily. If  LDL remains above goal of 70, would recommend titration of Crestor to 10mg  daily.    Medication Adjustments/Labs and Tests Ordered: Current medicines are reviewed at length with the patient today.  Concerns regarding medicines are outlined above.  Medication changes, Labs and Tests ordered today are listed in the Patient Instructions below. Patient Instructions  Medication Instructions:  Your physician has recommended you make the following change in your medication:  INCREASE Toprol XL to 50 mg tablets daily  *If you need a refill on your cardiac medications before your next appointment, please call your pharmacy*   Lab Work: None  If you have labs (blood work) drawn today and your tests are completely normal, you will receive your results only by: MyChart Message (if you have MyChart) OR . A paper copy in the mail If you have any lab test that is abnormal or we need to change your treatment, we will call you to review the results.   Testing/Procedures: None   Follow-Up: At [facility], you and your health needs are our priority.  As part of our continuing mission to provide you with exceptional heart care, we have created designated Provider Care Teams.  These Care Teams include your primary Cardiologist (physician) and Advanced Practice Providers (APPs -  Physician Assistants and Nurse Practitioners) who all work together to provide you with the care you need, when you need it.  We recommend signing up for the patient portal called "MyChart".  Sign up information is provided on this After Visit Summary.  MyChart is used to connect with patients for Virtual Visits (Telemedicine).  Patients are able to view lab/test results, encounter notes, upcoming appointments, etc.  Non-urgent messages can be sent to your provider as well.   To learn more about what you can do with MyChart, go to [facility].     Your next appointment:   2 month(s)  The format for your next appointment:   In Person  Provider:   [email or web address], MD or Nona Dell, PA_C   Other Instructions       Signed, Randall An, PA-C  05/12/2021 8:07 PM    [facility] [address] [place], [place] Phone: [phone] Fax: [phone]

## 2021-05-19 ENCOUNTER — Other Ambulatory Visit: Payer: Self-pay | Admitting: *Deleted

## 2021-05-19 ENCOUNTER — Other Ambulatory Visit: Payer: Self-pay

## 2021-05-19 NOTE — Patient Outreach (Signed)
[name]) Care Management  05/19/2021  [name] November 05, 1956 [id number]   [name] Outreach to Complex care patient with case closure   [name] as referred to [name] on 09/04/19 for MD referral from Dr Syliva [name] Referral Reason: pt has medicare but can't afford medications does not have drug coverage- has emphysema   -Resolved medicare coverage issue in October 2020 Insurance: [employer] Medicare/medicaid of [place] family planning effective 10/23/2017 SSI disability  [employer] medicare for drug coverage effective 10/06/19      Admissions/ ED visits 03/21/21 ED visit for Tachycardia prior to a scheduled colonoscopy dx SVT (Supraventricular tachycardia) 02/11/21 ED visit shortness of breath (sob)/COPD exacerbation with discharge home 12/15/20 -12/17/20 admission for Hyponatremia, right flank pain/right hip pain/back pain/sever lumbar spinal stenosis/alcohol dependence/THC use/Tobacco abuse/COPD/alcoholic peripheral neuropathy   Outreach to home number [phone] Patient is able to verify HIPAA [name] Portability and Accountability Act) identifiers Reviewed and addressed the purpose of the follow up call with the patient   Consent: [name] ([facility]) [name] CM reviewed [facility] services with patient. Patient gave verbal consent for services.   [facility] follow up assessment  Follow up cardiology appointment understand to take 2 pills instead of 1 and that "My heart had gotten faster"  [name] [place] [name] CM Reviewed SVT Pt confirms he is not noticing when his heart rate is elevated Denies dizziness, chest pain, headaches He has a pulse oximeter he could use in the home to check his heart rate and to outreach to h is primary care provider (PCP) and/or cardiology staff if his heart rated goes above 100 he repeated this using teach back method "over 100" Neighbor died about a month ago of a MI and he reflects on this and states he wants to maintain his quality of  life Discussed reason why he is not able to complete the previously scheduled colonoscopy until stable cardiac status He reports he frequently drinks beers, sodas & like to watch tv or do yard work Stress and stress management discussed  He agreed he would find a place to get calm to reduce his stress He reports he is able to go to an area in his basement  Pcp follow up Encouraged him to call to schedule his 6 month visit with pcp    Discussed [facility] progression to [name] disease management or Embedded services Does not prefer [facility] disease management services  Sent supraventricular tachycardia (svt) EMMI education  Plan Patient agrees no further follow up at this time- case closure Letters sent  Goals Addressed               This Visit's Progress     Patient Stated     COMPLETED: [name]) Make and Keep All Appointments (pt-stated)   On track     Timeframe:  Long-Range Goal Priority:  Medium Start Date:           12/12/20                  Expected End Date:          04/14/21             Case closure 05/19/21    Barriers: Knowledge  - ask family or friend for a ride - keep a calendar with appointment dates    Notes:   05/19/21 case closure will schedule pcp follow up appointment 04/14/21 has kept appointments 03/22/21 attended 03/21/21 colonoscopy but cancelled related to elevated heart rate  and sent to ED. Dx SVT palpations, discharged home Disappointed he was not able to complete colonoscopy 03/20/21 attended covid testing and pre op colonoscopy 03/20/21 appointments Has preparation items for pending 03/21/21 colonoscopy 03/13/21 agreed to coordination of covid test on 03/20/21 & colonoscopy for 03/21/21 with [name], [name] & brother [name] 03/10/21 agreed to the attempt to assist with re scheduling colonoscopy with brother [name] help LVM at Nicholas office  02/24/21 Still has not completed colonoscopy Noted not scheduled for any further MD visits. Brother ED Still at the home caring for pt mother and available  to assist pt 12/12/20 calls to Nicholas and pcp to assist with rescheduling of missed 11/16/20 colonoscopy       COMPLETED: [name]) Maintain My Quality of Life (pt-stated)   On track     Case closure date 05/19/21   Barriers: Knowledge  - discuss my treatment options with the doctor or nurse - do one enjoyable thing every day - make shared treatment decisions with doctor - spend time outdoors at least 3 times a week    Notes:  05/19/21 goal completed/case closure- discussed treatment options with [name] CM and will follow up with pcp, discussed him enjoying watching tv and yard work 04/14/21 has seen cardiologist and is taking medications as ordered  Taking care of his mother Doing enjoyable activities (drinking , smoking, lawn care) 03/22/21 Denies concerns at this time 03/20/21 attended covid testing and pre op colonoscopy 03/20/21 appointments Has preparation items for pending 03/21/21 colonoscopy 03/13/21 agreed to coordination of covid test on 03/20/21 & colonoscopy for 03/21/21 with [name], [name] & brother [name] 03/10/21 agreed to the attempt to assist with re scheduling colonoscopy with brother [name] help LVM at Nicholas office  02/24/21 Pt continues to smoke, drink, spend time with family and friends and complete his lawn care (enjoyable tasks)        COMPLETED: [name]) Stop or Cut Down Tobacco Use (pt-stated)   Not on track     Case closure Date 05/19/21  Barriers: Knowledge    - cut down amount of tobacco product used (chew, cigars)    Notes: 05/19/21 continues to smoke completing goal - pt unable to progress  04/14/21 continues to smoke Not progressing toward goal 03/20/21 continues to smoke 02/24/21 continues to smoke but "just about to quit"  Voiced interest to get on [place] with assist of primary care provider (PCP) The rubber band therapy to encourage smoke cessation did not work  "It's a habit that is hard to break. I have been smoking since I was [age] years old"         COMPLETED: [name]) Track and  Manage My Symptoms (pt-stated)   On track     Case closure Date 05/19/21   Barriers: Knowledge  - eliminate symptom triggers at home - follow rescue plan if symptoms flare-up - keep follow-up appointments     Notes:  05/19/21 goal completed denies worsening COPD, SVT symptoms to follow up with MDs 04/14/21 denies concerns with cardiac and pulmonary illnesses, taking medicines, keeping appointments 03/22/21 denies worsening symptoms 02/24/21 Continue with triggers of COPD to include smoking, lawn care Has inhalers, nebulizer Recent completed antibiotics after 02/11/21 ED visit States he will start wearing mask with lawn care Has not completed colonoscopy and not noted to have any scheduled MD visits          Crews Mccollam L. Noelle Penner, [name], BSN, CCM [name] Telephonic Care Management Care Coordinator Office number [phone]  [facility] number [phone] Fax number [phone]

## 2021-08-14 ENCOUNTER — Telehealth: Payer: Self-pay | Admitting: Family Medicine

## 2021-08-14 NOTE — Telephone Encounter (Signed)
Left message for patient to call back and schedule Medicare Annual Wellness Visit (AWV) in office.   If unable to come into the office for AWV,  please offer to do virtually or by telephone.  Last AWV: 07/29/2020  Please schedule at anytime with RPC-Nurse Health Advisor.  40 minute appointment  Any questions, please contact me at [phone]      

## 2021-08-14 NOTE — Progress Notes (Deleted)
Cardiology Office Note    Date:  08/14/2021   ID:  [name], DOB 22-Nov-1956, MRN [id number]  PCP:  Nicholas Perches, MD  Cardiologist: Nicholas Dell, MD    No chief complaint on file.   History of Present Illness:    [name] is a 65 y.o. male with past medical history of SVT, coronary calcification by CT, HTN and COPD who presents to the office today for 46-month follow-up.   He was last examined by myself in 04/2021 following a recent ED evaluation for SVT during which he had converted back to NSR with IV Adenosine and was started on Toprol-XL 25mg  daily. At the time of his visit, he denied any recurrent palpitations but HR was initially in the 140's upon vitals being checked by nursing staff but he reported being in a verbal altercation with a family member prior to his visit. By the time an EKG was obtained, he was back in NSR. Toprol-XL was titrated from 25mg  daily to 50mg  daily with plans for referral to EP if he continued to have intermittent tachycardia.     Past Medical History:  Diagnosis Date   Alcohol abuse    Allergic rhinitis    Asthma    COPD (chronic obstructive pulmonary disease) (HCC)    Coronary atherosclerosis    Based on chest CT 2012   Essential hypertension    History of neutropenia    Also lymphocytosis - followed by Dr.   Nicotine addiction    Olecranon bursitis, right elbow    PSVT (paroxysmal supraventricular tachycardia) [facility])    Document in April 2022    Past Surgical History:  Procedure Laterality Date   COLONOSCOPY     MULTIPLE TOOTH EXTRACTIONS  June 2012   OLECRANON BURSECTOMY Right 11/26/2019   Procedure: May 2022;  Surgeon: July 2012, MD;  Location: [facility];  Service: Orthopedics;  Laterality: Right;  pt knows to arrive at 6:15    Current Medications: Outpatient Medications Prior to Visit  Medication Sig Dispense Refill   albuterol (VENTOLIN HFA) 108 (90 Base) MCG/ACT inhaler Inhale  2 puffs into the lungs every 6 (six) hours as needed for wheezing or shortness of breath. 16 g 2   amLODipine (NORVASC) 5 MG tablet Take 1 tablet (5 mg total) by mouth daily. 90 tablet 1   aspirin EC 81 MG tablet Take 81 mg by mouth daily. Swallow whole.     busPIRone (BUSPAR) 5 MG tablet Take 1 tablet (5 mg total) by mouth 3 (three) times daily. 270 tablet 3   diphenoxylate-atropine (LOMOTIL) 2.5-0.025 MG tablet Take 1 tablet by mouth 4 (four) times daily as needed for diarrhea or loose stools. 20 tablet 0   ipratropium-albuterol (DUONEB) 0.5-2.5 (3) MG/3ML SOLN INHALE 1 VIAL VIA NEBULIZER EVERY SIX HOURS AS NEEDED FOR WHEEZING/ SHORTNESS OF BREATH 90 mL 0   metoprolol succinate (TOPROL-XL) 50 MG 24 hr tablet Take 1 tablet (50 mg total) by mouth daily. Take with or immediately following a meal. 90 tablet 3   rosuvastatin (CRESTOR) 5 MG tablet Take 1 tablet (5 mg total) by mouth daily. 90 tablet 3   No facility-administered medications prior to visit.     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   Tobacco Use   Smoking status: Some Days    Packs/day: 0.50    Years: 38.00    Pack years: 19.00    Types: Cigarettes   Smokeless tobacco: Never   Tobacco comments:    smokes 6 cigarettes per day 10/18/2020  Vaping Use   Vaping Use: Never used  Substance and Sexual Activity   Alcohol use: Yes    Alcohol/week: 12.0 standard drinks    Types: 12 Cans of beer per week    Comment: occasionally a beer   Drug use: No   Sexual activity: Not on file  Other Topics Concern   Not on file  Social History Narrative   [name] is at 76 year old single retired male on disability who lives with his  mother and sister. 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[employer])   He does not drive He is independent with his ADLs but needs assist with reading and his iADLs   Social Determinants of Corporate investment banker Strain: Not on file  Food Insecurity: No Food Insecurity   Worried About Programme researcher, broadcasting/film/video in the Last Year: Never true   Barista in the Last Year: Never true  Transportation Needs: No Transportation Needs   Lack of Transportation (Medical): No   Lack of Transportation (Non-Medical): No  Physical Activity: Not on file  Stress: No Stress Concern Present   Feeling of Stress : Only a little  Social Connections: Moderately Integrated   Frequency of Communication with Friends and Family: More than three times a week   Frequency of Social Gatherings with Friends and Family: More than three times a week   Attends Religious Services: 1 to 4 times per year   Active Member of [employer] or Organizations: No   Attends Engineer, structural: 1 to 4 times per year   Marital Status: Never married     Family History:  The patient's ***family history includes Diabetes in his father and mother; Hypertension in his mother and sister.   Review of Systems:    Please see the history of present illness.     All other systems reviewed and are otherwise negative except as noted above.   Physical Exam:    VS:  There were no vitals taken for this visit.   General: Well developed, well nourished,male appearing in no acute distress. Head: Normocephalic, atraumatic. Neck: No carotid bruits. JVD not elevated.  Lungs: Respirations regular and unlabored, without wheezes or rales.  Heart: ***Regular rate and rhythm. No S3 or S4.  No murmur, no rubs, or gallops appreciated. Abdomen: Appears non-distended. No obvious abdominal masses. Msk:  Strength and tone appear normal for age. No obvious joint deformities or  effusions. Extremities: No clubbing or cyanosis. No edema.  Distal pedal pulses are 2+ bilaterally. Neuro: Alert and oriented X 3. Moves all extremities spontaneously. No focal deficits noted. Psych:  Responds to questions appropriately with a normal affect. Skin: No rashes or lesions noted  Wt Readings from Last 3 Encounters:  05/12/21 139 lb (63 kg)  04/12/21 133 lb 3.2 oz (60.4 kg)  03/21/21 135 lb (61.2 kg)        Studies/Labs Reviewed:   EKG:  EKG is*** ordered today.  The ekg ordered  today demonstrates ***  Recent Labs: 12/15/2020: TSH 2.648 12/16/2020: Magnesium 2.0 03/21/2021: ALT 47; B Natriuretic Peptide 78.0; BUN 12; Creatinine, Ser 1.37; Hemoglobin 17.1; Platelets 261; Potassium 4.3; Sodium 133   Lipid Panel    Component Value Date/Time   CHOL 201 (H) 11/29/2020 1116   TRIG 111 11/29/2020 1116   HDL 101 11/29/2020 1116   CHOLHDL 2.0 11/29/2020 1116   CHOLHDL 1.9 05/17/2020 1119   VLDL 21 11/22/2016 0955   LDLCALC 81 11/29/2020 1116   LDLCALC 84 05/17/2020 1119    Additional studies/ records that were reviewed today include:   Echocardiogram: 05/2020 IMPRESSIONS     1. Left ventricular ejection fraction, by estimation, is 55 to 60%. The  left ventricle has normal function. The left ventricle has no regional  wall motion abnormalities. Left ventricular diastolic parameters are  consistent with Grade I diastolic  dysfunction (impaired relaxation).   2. Right ventricular systolic function is normal. The right ventricular  size is normal.   3. The mitral valve is normal in structure. No evidence of mitral valve  regurgitation. No evidence of mitral stenosis.   4. The aortic valve is tricuspid. Aortic valve regurgitation is not  visualized. No aortic stenosis is present.   5. The inferior vena cava is normal in size with greater than 50%  respiratory variability, suggesting right atrial pressure of 3 mmHg.   Assessment:    No diagnosis found.   Plan:    In order of problems listed above:  ***    Shared Decision Making/Informed Consent:   {Are you ordering a CV Procedure (e.g. stress test, cath, DCCV, TEE, etc)?   Press F2        :[id number]}    Medication Adjustments/Labs and Tests Ordered: Current medicines are reviewed at length with the patient today.  Concerns regarding medicines are outlined above.  Medication changes, Labs and Tests ordered today are listed in the Patient Instructions below. There are no Patient Instructions on file for this visit.   Signed, Ellsworth Lennox, PA-C  08/14/2021 2:18 PM    [facility] [address] [place], [place] [address] Phone: [phone] Fax: [phone]

## 2021-08-15 ENCOUNTER — Ambulatory Visit: Payer: Medicare Other | Admitting: Student

## 2021-09-04 ENCOUNTER — Telehealth: Payer: Self-pay | Admitting: Family Medicine

## 2021-09-04 NOTE — Telephone Encounter (Signed)
Left message for patient to call back and schedule Medicare Annual Wellness Visit (AWV) in office.   If unable to come into the office for AWV,  please offer to do virtually or by telephone.  Last AWV: 07/29/2020  Please schedule at anytime with RPC-Nurse Health Advisor.  40 minute appointment  Any questions, please contact me at [phone]

## 2021-11-28 ENCOUNTER — Ambulatory Visit: Payer: Medicare Other | Admitting: Internal Medicine

## 2022-04-17 ENCOUNTER — Ambulatory Visit: Payer: Medicare Other | Admitting: Family Medicine

## 2022-04-26 ENCOUNTER — Ambulatory Visit: Payer: Medicare Other | Admitting: Family Medicine

## 2022-04-27 ENCOUNTER — Encounter: Payer: Self-pay | Admitting: Family Medicine

## 2022-04-27 ENCOUNTER — Ambulatory Visit (INDEPENDENT_AMBULATORY_CARE_PROVIDER_SITE_OTHER): Payer: Medicare Other | Admitting: Family Medicine

## 2022-04-27 VITALS — BP 160/92 | Resp 16 | Ht 71.0 in | Wt 131.0 lb

## 2022-04-27 DIAGNOSIS — Y909 Presence of alcohol in blood, level not specified: Secondary | ICD-10-CM

## 2022-04-27 DIAGNOSIS — R7301 Impaired fasting glucose: Secondary | ICD-10-CM

## 2022-04-27 DIAGNOSIS — J432 Centrilobular emphysema: Secondary | ICD-10-CM

## 2022-04-27 DIAGNOSIS — E559 Vitamin D deficiency, unspecified: Secondary | ICD-10-CM

## 2022-04-27 DIAGNOSIS — F10288 Alcohol dependence with other alcohol-induced disorder: Secondary | ICD-10-CM

## 2022-04-27 DIAGNOSIS — Z13220 Encounter for screening for lipoid disorders: Secondary | ICD-10-CM

## 2022-04-27 DIAGNOSIS — F17210 Nicotine dependence, cigarettes, uncomplicated: Secondary | ICD-10-CM | POA: Diagnosis not present

## 2022-04-27 DIAGNOSIS — I10 Essential (primary) hypertension: Secondary | ICD-10-CM

## 2022-04-27 DIAGNOSIS — F17218 Nicotine dependence, cigarettes, with other nicotine-induced disorders: Secondary | ICD-10-CM

## 2022-04-27 DIAGNOSIS — F1029 Alcohol dependence with unspecified alcohol-induced disorder: Secondary | ICD-10-CM

## 2022-04-27 MED ORDER — AMLODIPINE BESYLATE 5 MG PO TABS: 5.0000 mg | ORAL_TABLET | Freq: Every day | ORAL | 4 refills | Status: DC

## 2022-04-27 NOTE — Patient Instructions (Addendum)
F/u in  to 8 weeks, re evaluate blood pressure, call if you need me sooner ? ?New for blood pressure is amlodipine 5 mg one daily ? ?Now smoking 5 ciggs/ day, continue to cut back with a view to quitting ? ?Now drinking 72 ounces beer daily, please try yo cut back ? ?I recommend Chest scan and colonscopy for scrreening ? ?I recommend vaccines , shingrix, and TdAP ? ?Good that you are back ? ?CBC, lipid, cmp and EGFr, TSH, Vit D  ? ?Thanks for choosing [facility], we consider it a privelige to serve you. ? ?

## 2022-04-28 LAB — CBC
Hematocrit: 49.8 % (ref 37.5–51.0)
Hemoglobin: 16.6 g/dL (ref 13.0–17.7)
MCH: 29.2 pg (ref 26.6–33.0)
MCHC: 33.3 g/dL (ref 31.5–35.7)
MCV: 88 fL (ref 79–97)
Platelets: 303 10*3/uL (ref 150–450)
RBC: 5.69 x10E6/uL (ref 4.14–5.80)
RDW: 11.8 % (ref 11.6–15.4)
WBC: 5.9 10*3/uL (ref 3.4–10.8)

## 2022-04-28 LAB — CMP14+EGFR
ALT: 25 IU/L (ref 0–44)
AST: 54 IU/L — ABNORMAL HIGH (ref 0–40)
Albumin/Globulin Ratio: 1.8 (ref 1.2–2.2)
Albumin: 4.9 g/dL — ABNORMAL HIGH (ref 3.8–4.8)
Alkaline Phosphatase: 93 IU/L (ref 44–121)
BUN/Creatinine Ratio: 9 — ABNORMAL LOW (ref 10–24)
BUN: 9 mg/dL (ref 8–27)
Bilirubin Total: 0.7 mg/dL (ref 0.0–1.2)
CO2: 21 mmol/L (ref 20–29)
Calcium: 9.7 mg/dL (ref 8.6–10.2)
Chloride: 91 mmol/L — ABNORMAL LOW (ref 96–106)
Creatinine, Ser: 0.99 mg/dL (ref 0.76–1.27)
Globulin, Total: 2.7 g/dL (ref 1.5–4.5)
Glucose: 67 mg/dL — ABNORMAL LOW (ref 70–99)
Potassium: 5.4 mmol/L — ABNORMAL HIGH (ref 3.5–5.2)
Sodium: 132 mmol/L — ABNORMAL LOW (ref 134–144)
Total Protein: 7.6 g/dL (ref 6.0–8.5)
eGFR: 86 mL/min/{1.73_m2} (ref >59)

## 2022-04-28 LAB — LIPID PANEL
Chol/HDL Ratio: 1.7 ratio (ref 0.0–5.0)
Cholesterol, Total: 173 mg/dL (ref 100–199)
HDL: 104 mg/dL (ref >39)
LDL Chol Calc (NIH): 53 mg/dL (ref 0–99)
Triglycerides: 91 mg/dL (ref 0–149)
VLDL Cholesterol Cal: 16 mg/dL (ref 5–40)

## 2022-04-28 LAB — TSH: TSH: 2.17 u[IU]/mL (ref 0.450–4.500)

## 2022-04-28 LAB — VITAMIN D 25 HYDROXY (VIT D DEFICIENCY, FRACTURES): Vit D, 25-Hydroxy: 9.6 ng/mL — ABNORMAL LOW (ref 30.0–100.0)

## 2022-04-29 ENCOUNTER — Encounter: Payer: Self-pay | Admitting: Family Medicine

## 2022-04-29 MED ORDER — ERGOCALCIFEROL 1.25 MG (50000 UT) PO CAPS: 50000.0000 [IU] | ORAL_CAPSULE | ORAL | 2 refills | Status: DC

## 2022-04-29 NOTE — Assessment & Plan Note (Signed)
Uncontrolled ?Start med and re evaluate  ?DASH diet and commitment to daily physical activity for a minimum of 30 minutes discussed and encouraged, as a part of hypertension management. ?The importance of attaining a healthy weight is also discussed. ? ? ?  04/27/2022  ?  4:18 PM 04/27/2022  ?  4:10 PM 04/27/2022  ?  4:05 PM 05/12/2021  ?  2:35 PM 04/12/2021  ?  9:29 AM 03/21/2021  ?  2:00 PM 03/21/2021  ?  1:30 PM  ?BP/Weight  ?Systolic BP 160 160 202 140 160 138 136  ?Diastolic BP 92 92 121 80 80 88 92  ?Wt. (Lbs)   131 139 133.2    ?BMI   18.27 kg/m2 19.39 kg/m2 18.58 kg/m2    ? ? ? ? ?

## 2022-04-29 NOTE — Progress Notes (Signed)
? ?  [name]     MRN: [id number]      DOB: Mar 22, 1956 ? ? ?HPI ?[name] is here for follow up and re-evaluation of chronic medical conditions, medication management and review of any available recent lab and radiology data.  ?Refuses cancer screening and immunization ?Drinks beer daily, approx 64 oz ,no plan to quit ?Smokes cigarettes, no plan to quit  ? ?ROS ?Denies recent fever or chills. ?Denies sinus pressure, nasal congestion, ear pain or sore throat. ?Denies chest congestion, productive cough or wheezing. ?Denies chest pains, palpitations and leg swelling ?Denies abdominal pain, nausea, vomiting,diarrhea or constipation.   ?Denies dysuria, frequency, hesitancy or incontinence. ?Denies joint pain, swelling and limitation in mobility. ?Denies headaches, seizures, numbness, or tingling. ?Denies depression, anxiety or insomnia. ?Denies skin break down or rash. ? ? ?PE ? ?BP (!) 160/92   Resp 16   Ht 5\' 11"  (1.803 m)   Wt 131 lb (59.4 kg)   SpO2 93%   BMI 18.27 kg/m?  ? ?Patient alert and oriented and in no cardiopulmonary distress. ? ?HEENT: No facial asymmetry, EOMI,     Neck supple . ? ?Chest: Clear to auscultation bilaterally.Decreased air entry ? ?CVS: S1, S2 no murmurs, no S3.Regular rate. ? ?ABD: Soft non tender.  ? ?Ext: No edema ? ?MS: Adequate ROM spine, shoulders, hips and knees. ? ?Skin: Intact, no ulcerations or rash noted. ? ?Psych: Good eye contact, normal affect. Memory intact not anxious or depressed appearing. ? ?CNS: CN 2-12 intact, power,  normal throughout.no focal deficits noted. ? ? ?Assessment & Plan ? ?Essential hypertension ?Uncontrolled ?Start med and re evaluate  ?DASH diet and commitment to daily physical activity for a minimum of 30 minutes discussed and encouraged, as a part of hypertension management. ?The importance of attaining a healthy weight is also discussed. ? ? ?  04/27/2022  ?  4:18 PM 04/27/2022  ?  4:10 PM 04/27/2022  ?  4:05 PM 05/12/2021  ?  2:35 PM 04/12/2021   ?  9:29 AM 03/21/2021  ?  2:00 PM 03/21/2021  ?  1:30 PM  ?BP/Weight  ?Systolic BP 160 160 202 140 160 138 136  ?Diastolic BP 92 92 121 80 80 88 92  ?Wt. (Lbs)   131 139 133.2    ?BMI   18.27 kg/m2 19.39 kg/m2 18.58 kg/m2    ? ? ? ? ? ?Cigarette smoker ?Asked:confirms currently smokes cigarettes ?Assess: Unwilling to set a quit date, but is cutting back ?Advise: needs to QUIT to reduce risk of cancer, cardio and cerebrovascular disease ?Assist: counseled for 5 minutes and literature provided ?Arrange: follow up in 2 to 4 months ? ? ?Vitamin D deficiency ?Needs weekly supplement ? ?Centrilobular emphysema (HCC) ?Worsening due to ongoing nicotine use, encouraged to quit ? ?Alcohol addiction (HCC) ?Recommend cutting back with a view to quitting, offered AA, no interest ? ?Nicotine dependence, cigarettes, with other nicotine-induced disorders ?Asked:confirms currently smokes cigarettes ?Assess: Unwilling to set a quit date, but is cutting back ?Advise: needs to QUIT to reduce risk of cancer, cardio and cerebrovascular disease ?Assist: counseled for 5 minutes and literature provided ?Arrange: follow up in 2 to 4 months' ? ?

## 2022-04-29 NOTE — Assessment & Plan Note (Signed)
Needs weekly supplement 

## 2022-04-29 NOTE — Assessment & Plan Note (Signed)
Recommend cutting back with a view to quitting, offered AA, no interest ?

## 2022-04-29 NOTE — Assessment & Plan Note (Signed)
Asked:confirms currently smokes cigarettes °Assess: Unwilling to set a quit date, but is cutting back °Advise: needs to QUIT to reduce risk of cancer, cardio and cerebrovascular disease °Assist: counseled for 5 minutes and literature provided °Arrange: follow up in 2 to 4 months ° °

## 2022-04-29 NOTE — Assessment & Plan Note (Signed)
Worsening due to ongoing nicotine use, encouraged to quit 

## 2022-05-01 ENCOUNTER — Telehealth: Payer: Self-pay | Admitting: Family Medicine

## 2022-05-01 NOTE — Telephone Encounter (Signed)
Patient returning call. ? ?Can leave message on voicemail or with mother or sister, patient states he's working in the yard. ?

## 2022-05-04 NOTE — Telephone Encounter (Signed)
Sent results via active mychart

## 2022-06-22 ENCOUNTER — Encounter: Payer: Self-pay | Admitting: Family Medicine

## 2022-06-22 ENCOUNTER — Ambulatory Visit (INDEPENDENT_AMBULATORY_CARE_PROVIDER_SITE_OTHER): Payer: Medicare Other | Admitting: Family Medicine

## 2022-06-22 VITALS — BP 180/110 | HR 104 | Ht 66.0 in | Wt 140.1 lb

## 2022-06-22 DIAGNOSIS — F418 Other specified anxiety disorders: Secondary | ICD-10-CM | POA: Diagnosis not present

## 2022-06-22 DIAGNOSIS — I10 Essential (primary) hypertension: Secondary | ICD-10-CM | POA: Diagnosis not present

## 2022-06-22 DIAGNOSIS — F1029 Alcohol dependence with unspecified alcohol-induced disorder: Secondary | ICD-10-CM

## 2022-06-22 DIAGNOSIS — F17210 Nicotine dependence, cigarettes, uncomplicated: Secondary | ICD-10-CM | POA: Diagnosis not present

## 2022-06-22 MED ORDER — AMLODIPINE BESYLATE 10 MG PO TABS: 10.0000 mg | ORAL_TABLET | Freq: Every day | ORAL | 2 refills | Status: DC

## 2022-06-22 MED ORDER — SPIRONOLACTONE 25 MG PO TABS: 25.0000 mg | ORAL_TABLET | Freq: Every day | ORAL | 2 refills | Status: DC

## 2022-06-22 NOTE — Progress Notes (Signed)
[name]     MRN: [id number]      DOB: 1956-08-02   HPI [name] is here for follow up and re-evaluation of chronic medical conditions, medication management and review of any available recent lab and radiology data.  Preventive health is updated, specifically  Cancer screening and Immunization.   Questions or concerns regarding consultations or procedures which the PT has had in the interim are  addressed. The PT denies any adverse reactions to current medications since the last visit.  There are no new concerns.  There are no specific complaints   ROS Denies recent fever or chills. Denies sinus pressure, nasal congestion, ear pain or sore throat. Denies chest congestion, productive cough or wheezing. Denies chest pains, palpitations and leg swelling Denies abdominal pain, nausea, vomiting,diarrhea or constipation.   Denies dysuria, frequency, hesitancy or incontinence. Denies joint pain, swelling and limitation in mobility. Denies headaches, seizures, numbness, or tingling. C/o aggravation as his yard was being dug up this am , for cable lines to be placed , unexpectedly per his report, also states that mother an sister's health is deteriorating and this bothers him States did drink excessively with his buddies on July 4,  also still smokes, not as less but not setting quit date. Denies skin break down or rash.   PE  BP (!) 180/110   Pulse (!) 104   Ht 5\' 6"  (1.676 m)   Wt 140 lb 1.9 oz (63.6 kg)   SpO2 97%   BMI 22.62 kg/m   Patient alert and oriented and in  cardiopulmonary distress.  HEENT: No facial asymmetry, EOMI,     Neck supple .  Chest: Clear to auscultation bilaterally.decreased airentry  CVS: S1, S2 no murmurs, no S3.Regular rate.  ABD: Soft non tender.   Ext: No edema  MS: Adequate ROM spine, shoulders, hips and knees.  Skin: Intact, no ulcerations or rash noted.  Psych: Good eye contact, normal affect. Memory intact  anxious onto  depressed appearing.  CNS: CN 2-12 intact, power,  normal throughout.no focal deficits noted.   Assessment & Plan  Alcohol addiction (HCC) ounselled re need to quit , still drinking excessively with adverse effects on his health, will not go to rehab, states he has been in the past   Cigarette smoker Asked:confirms currently smokes cigarettes Assess: Unwilling to set a quit date,not cutting back Advise: needs to QUIT to reduce risk of cancer, cardio and cerebrovascular disease Assist: counseled for 5 minutes and literature provided Arrange: follow up in 2 to 4 months   Depression with anxiety anxiet improved on current dose buspar will continue same  Essential hypertension Uncontrolled and markedly elevated, increase dose of amlodipine. Alcohol also contributing to uncontrolled BP DASH diet and commitment to daily physical activity for a minimum of 30 minutes discussed and encouraged, as a part of hypertension management. The importance of attaining a healthy weight is also discussed.     06/22/2022    2:25 PM 06/22/2022    1:43 PM 06/22/2022    1:41 PM 04/27/2022    4:18 PM 04/27/2022    4:10 PM 04/27/2022    4:05 PM 05/12/2021    2:35 PM  BP/Weight  Systolic BP 180 180 195 160 160 202 140  Diastolic BP 110 110 125 92 92 05/14/2021 80  Wt. (Lbs)   140.12   131 139  BMI   22.62 kg/m2   18.27 kg/m2 19.39 kg/m2

## 2022-06-22 NOTE — Patient Instructions (Signed)
F/U in 5 weeks, re evaluate blood pressure, call if you need me sooner  Increase amlodipine to 10 mg one daily New also for blood pressure is spironolactone 25 mg daily  PLEASE work on quitting smoking and drinking alcohol, both are not good for your health  Thanks for choosing [facility], we consider it a privelige to serve you.

## 2022-06-22 NOTE — Assessment & Plan Note (Signed)
anxiet improved on current dose buspar will continue same

## 2022-06-22 NOTE — Assessment & Plan Note (Signed)
Asked:confirms currently smokes cigarettes ?Assess: Unwilling to set a quit date, not cutting back ?Advise: needs to QUIT to reduce risk of cancer, cardio and cerebrovascular disease ?Assist: counseled for 5 minutes and literature provided ?Arrange: follow up in 2 to 4 months ? ?

## 2022-06-22 NOTE — Assessment & Plan Note (Signed)
Uncontrolled and markedly elevated, increase dose of amlodipine. Alcohol also contributing to uncontrolled BP DASH diet and commitment to daily physical activity for a minimum of 30 minutes discussed and encouraged, as a part of hypertension management. The importance of attaining a healthy weight is also discussed.     06/22/2022    2:25 PM 06/22/2022    1:43 PM 06/22/2022    1:41 PM 04/27/2022    4:18 PM 04/27/2022    4:10 PM 04/27/2022    4:05 PM 05/12/2021    2:35 PM  BP/Weight  Systolic BP 180 180 195 160 160 202 140  Diastolic BP 110 110 125 92 92 007 80  Wt. (Lbs)   140.12   131 139  BMI   22.62 kg/m2   18.27 kg/m2 19.39 kg/m2

## 2022-06-22 NOTE — Assessment & Plan Note (Signed)
ounselled re need to quit , still drinking excessively with adverse effects on his health, will not go to rehab, states he has been in the past

## 2022-07-11 ENCOUNTER — Ambulatory Visit (INDEPENDENT_AMBULATORY_CARE_PROVIDER_SITE_OTHER): Payer: Medicare Other

## 2022-07-11 DIAGNOSIS — Z0000 Encounter for general adult medical examination without abnormal findings: Secondary | ICD-10-CM

## 2022-07-11 NOTE — Progress Notes (Signed)
Subjective:   [name] is a 66 y.o. male who presents for Medicare Annual/Subsequent preventive examination.  Review of Systems    I connected with  [name] on 07/11/22 by a audio enabled telemedicine application and verified that I am speaking with the correct person using two identifiers.  Patient Location: Home  Provider Location: Office/Clinic  I discussed the limitations of evaluation and management by telemedicine. The patient expressed understanding and agreed to proceed.  Cardiac Risk Factors include: none     Objective:    There were no vitals filed for this visit. There is no height or weight on file to calculate BMI.     07/11/2022    4:22 PM 03/21/2021    9:39 AM 03/21/2021    8:50 AM 03/10/2021    2:55 PM 02/11/2021    7:59 PM 12/15/2020    8:00 PM 12/15/2020    3:54 AM  Advanced Directives  Does Patient Have a Medical Advance Directive? No No No No No No No  Would patient like information on creating a medical advance directive? No - Patient declined  No - Patient declined No - Patient declined No - Patient declined No - Patient declined No - Patient declined    Current Medications (verified) Outpatient Encounter Medications as of 07/11/2022  Medication Sig   albuterol (VENTOLIN HFA) 108 (90 Base) MCG/ACT inhaler Inhale 2 puffs into the lungs every 6 (six) hours as needed for wheezing or shortness of breath.   amLODipine (NORVASC) 10 MG tablet Take 1 tablet (10 mg total) by mouth daily.   busPIRone (BUSPAR) 5 MG tablet Take 1 tablet (5 mg total) by mouth 3 (three) times daily.   ergocalciferol (VITAMIN D2) 1.25 MG (50000 UT) capsule Take 1 capsule (50,000 Units total) by mouth once a week. One capsule once weekly   ipratropium-albuterol (DUONEB) 0.5-2.5 (3) MG/3ML SOLN INHALE 1 VIAL VIA NEBULIZER EVERY SIX HOURS AS NEEDED FOR WHEEZING/ SHORTNESS OF BREATH   spironolactone (ALDACTONE) 25 MG tablet Take 1 tablet (25 mg total) by mouth daily.    [DISCONTINUED] fluticasone (FLOVENT DISKUS) 50 MCG/BLIST diskus inhaler Inhale 1 puff into the lungs 2 (two) times daily.   No facility-administered encounter medications on file as of 07/11/2022.    Allergies (verified) Lisinopril   History: Past Medical History:  Diagnosis Date   Alcohol abuse    Allergic rhinitis    Asthma    COPD (chronic obstructive pulmonary disease) ([place])    Coronary atherosclerosis    Based on chest CT 2012   Essential hypertension    History of neutropenia    Also lymphocytosis - followed by Dr. Barnet Glasgow   Nicotine addiction    Olecranon bursitis, right elbow    PSVT (paroxysmal supraventricular tachycardia) [facility])    Document in April 2022   Past Surgical History:  Procedure Laterality Date   COLONOSCOPY     MULTIPLE TOOTH EXTRACTIONS  June 2012   OLECRANON BURSECTOMY Right 11/26/2019   Procedure: Earlean Polka;  Surgeon: Carole Civil, MD;  Location: [facility];  Service: Orthopedics;  Laterality: Right;  pt knows to arrive at 40:15   Family History  Problem Relation Age of Onset   Diabetes Mother    Hypertension Mother    Diabetes Father    Hypertension Sister    Social History   Socioeconomic History   Marital status: Single    Spouse name: none   Number of children: 0   Years of education:  Not on file   Highest education level: Not on file  Occupational History   Occupation: unemployed, retired     Comment: retired in ~ 2017 from [employer] Use   Smoking status: Some Days    Packs/day: 0.50    Years: 38.00    Total pack years: 19.00    Types: Cigarettes   Smokeless tobacco: Never   Tobacco comments:    smokes 6 cigarettes per day 10/18/2020  Vaping Use   Vaping Use: Never used  Substance and Sexual Activity   Alcohol use: Yes    Alcohol/week: 12.0 standard drinks of alcohol    Types: 12 Cans of beer per week    Comment: occasionally a beer   Drug use: No   Sexual activity:  Not on file  Other Topics Concern   Not on file  Social History Narrative   [name] is at 35 year old single retired male on disability who lives with his mother and sister. 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 ([employer])   He does not drive He is independent with his ADLs but needs assist with reading and his iADLs   Social Determinants of Radio broadcast assistant Strain: Low Risk  (07/11/2022)   Overall Financial Resource Strain (CARDIA)    Difficulty of Paying Living Expenses: Not hard at all  Food Insecurity: No Food Insecurity (07/11/2022)   Hunger Vital Sign    Worried About Running Out of Food in the Last Year: Never true    [place] in the Last Year: Never true  Transportation Needs: No Transportation Needs (07/11/2022)   PRAPARE - Hydrologist (Medical): No    Lack of Transportation (Non-Medical): No  Physical Activity: Sufficiently Active (07/11/2022)   Exercise Vital Sign    Days of Exercise per Week: 7 days    Minutes of Exercise per Session: 30 min  Stress: No Stress Concern Present (07/11/2022)   [place]    Feeling of Stress : Not at all  Social Connections: Moderately Integrated (07/11/2022)   Social Connection and Isolation Panel [NHANES]    Frequency of Communication with Friends and Family: More than three times a week    Frequency of Social Gatherings with Friends and Family: More than three times a week    Attends Religious Services: More than 4 times per year    Active Member of [employer] or Organizations: No    Attends Archivist Meetings: Never    Marital Status: Married    Tobacco Counseling Ready to quit: Not Answered Counseling given: Not Answered Tobacco comments: smokes 6 cigarettes per day 10/18/2020   Clinical Intake:  [name] , Thank you for taking time to come for your  Medicare Wellness Visit. I appreciate your ongoing commitment to your health goals. Please review the following plan we discussed and let me know if I can assist you in the future.   These are the goals we discussed:  Goals      Patient Stated     None at this time.     Quit smoking / using tobacco        This is a list of the screening recommended for you and due dates:  Health Maintenance  Topic Date Due   Zoster (Shingles) Vaccine (1 of  2) Never done   Colon Cancer Screening  08/05/2019   COVID-19 Vaccine (4 - Moderna series) 12/18/2020   Tetanus Vaccine  05/08/2021   Flu Shot  07/17/2022   Hepatitis C Screening: USPSTF Recommendation to screen - Ages 18-79 yo.  Completed   HIV Screening  Completed   HPV Vaccine  Aged Out    Pre-visit preparation completed: Yes  Pain : No/denies pain     BMI - recorded: 22.63 Nutritional Status: BMI of 19-24  Normal Nutritional Risks: None Diabetes: No  How often do you need to have someone help you when you read instructions, pamphlets, or other written materials from your doctor or pharmacy?: 1 - Never What is the last grade level you completed in school?: 11  Diabetic?NO  Interpreter Needed?: No      Activities of Daily Living    07/11/2022    4:23 PM  In your present state of health, do you have any difficulty performing the following activities:  Hearing? 1  Vision? 1  Difficulty concentrating or making decisions? 0  Walking or climbing stairs? 0  Dressing or bathing? 0  Doing errands, shopping? 0  Preparing Food and eating ? N  Using the Toilet? N  In the past six months, have you accidently leaked urine? N  Do you have problems with loss of bowel control? N  Managing your Medications? N  Managing your Finances? N  Housekeeping or managing your Housekeeping? N    Patient Care Team: Fayrene Helper, MD as PCP - General Domenic Polite Aloha Gell, MD as PCP - Cardiology (Cardiology) Carole Civil, MD as  Consulting Physician (Orthopedic Surgery) Perlie Mayo, NP as Nurse Practitioner (Family Medicine) Montez Morita, Quillian Quince, MD as Consulting Physician (Gastroenterology) Montez Morita, Quillian Quince, MD as Consulting Physician (Gastroenterology)  Indicate any recent Medical Services you may have received from other than [facility] providers in the past year (date may be approximate).     Assessment:   This is a routine wellness examination for [place].  Hearing/Vision screen No results found.  Dietary issues and exercise activities discussed: Current Exercise Habits: The patient does not participate in regular exercise at present, Exercise limited by: None identified   Goals Addressed             This Visit's Progress    Patient Stated       None at this time.      Depression Screen    07/11/2022    4:23 PM 07/11/2022    4:21 PM 06/22/2022    1:43 PM 03/22/2021    3:47 PM 03/20/2021    3:49 PM 03/10/2021    2:55 PM 02/24/2021    3:30 PM  PHQ 2/9 Scores  PHQ - 2 Score 0 0 0 0 0 0 0    Fall Risk    07/11/2022    4:22 PM 06/22/2022    1:42 PM 03/20/2021    3:47 PM 10/10/2020   12:16 PM 08/30/2020   10:09 AM  [place] in the past year? 0 0 0 0 0  Number falls in past yr: 0 0 0 0 0  Injury with Fall? 0 0 0 0 0  Risk for fall due to : No Fall Risks No Fall Risks   No Fall Risks  Follow up Falls evaluation completed Falls evaluation completed Falls evaluation completed;Falls prevention discussed Falls evaluation completed;Falls prevention discussed Falls evaluation completed    FALL RISK PREVENTION PERTAINING TO THE  HOME:  Any stairs in or around the home? Yes  If so, are there any without handrails? Yes  Home free of loose throw rugs in walkways, pet beds, electrical cords, etc? Yes  Adequate lighting in your home to reduce risk of falls? Yes   ASSISTIVE DEVICES UTILIZED TO PREVENT FALLS:  Life alert? No  Use of a cane, walker or w/c? No  Grab bars in the bathroom?  Yes  Shower chair or bench in shower? No  Elevated toilet seat or a handicapped toilet? No      07/11/2022    4:23 PM 07/29/2020    9:09 AM 07/29/2019    9:57 AM  6CIT Screen  What Year? 0 points 0 points 0 points  What month? 0 points 0 points 0 points  What time? 0 points 0 points 0 points  Count back from 20 0 points 0 points 0 points  Months in reverse 0 points 0 points 2 points  Repeat phrase 0 points 10 points 0 points  Total Score 0 points 10 points 2 points    Immunizations Immunization History  Administered Date(s) Administered   Influenza,inj,Quad PF,6+ Mos 10/15/2013, 09/27/2014, 09/21/2015, 09/23/2018, 08/10/2019, 08/30/2020   Influenza-Unspecified 10/08/2017   Moderna Sars-Covid-2 Vaccination 03/29/2020, 04/26/2020, 10/23/2020   Pneumococcal Conjugate-13 06/22/2014   Pneumococcal Polysaccharide-23 12/04/2012   Tdap 05/09/2011    TDAP status: Due, Education has been provided regarding the importance of this vaccine. Advised may receive this vaccine at local pharmacy or Health Dept. Aware to provide a copy of the vaccination record if obtained from local pharmacy or Health Dept. Verbalized acceptance and understanding.  Flu Vaccine status: Due, Education has been provided regarding the importance of this vaccine. Advised may receive this vaccine at local pharmacy or Health Dept. Aware to provide a copy of the vaccination record if obtained from local pharmacy or Health Dept. Verbalized acceptance and understanding.  Covid-19 vaccine status: Completed vaccines  Qualifies for Shingles Vaccine? Yes   Zostavax completed No   Shingrix Completed?: No.    Education has been provided regarding the importance of this vaccine. Patient has been advised to call insurance company to determine out of pocket expense if they have not yet received this vaccine. Advised may also receive vaccine at local pharmacy or Health Dept. Verbalized acceptance and understanding.  Screening  Tests Health Maintenance  Topic Date Due   Zoster Vaccines- Shingrix (1 of 2) Never done   COLONOSCOPY (Pts 45-21yrs Insurance coverage will need to be confirmed)  08/05/2019   COVID-19 Vaccine (4 - Moderna series) 12/18/2020   TETANUS/TDAP  05/08/2021   INFLUENZA VACCINE  07/17/2022   Hepatitis C Screening  Completed   HIV Screening  Completed   HPV VACCINES  Aged Out    Health Maintenance  Health Maintenance Due  Topic Date Due   Zoster Vaccines- Shingrix (1 of 2) Never done   COLONOSCOPY (Pts 45-51yrs Insurance coverage will need to be confirmed)  08/05/2019   COVID-19 Vaccine (4 - Moderna series) 12/18/2020   TETANUS/TDAP  05/08/2021    Colorectal cancer screening: Type of screening: Colonoscopy. Completed 08/04/09. Repeat every 10 years  Lung Cancer Screening: (Low Dose CT Chest recommended if Age 1-80 years, 30 pack-year currently smoking OR have quit w/in 15years.) does qualify.   Lung Cancer Screening Referral: NO   Additional Screening:  Hepatitis C Screening: does qualify; Completed 12/28/15  Vision Screening: Recommended annual ophthalmology exams for early detection of glaucoma and other disorders of the eye.  Is the patient up to date with their annual eye exam?  No  Who is the provider or what is the name of the office in which the patient attends annual eye exams? N/A If pt is not established with a provider, would they like to be referred to a provider to establish care? No .   Dental Screening: Recommended annual dental exams for proper oral hygiene  Community Resource Referral / Chronic Care Management: CRR required this visit?  No   CCM required this visit?  No      Plan:     I have personally reviewed and noted the following in the patient's chart:   Medical and social history Use of alcohol, tobacco or illicit drugs  Current medications and supplements including opioid prescriptions. Patient is not currently taking opioid  prescriptions. Functional ability and status Nutritional status Physical activity Advanced directives List of other physicians Hospitalizations, surgeries, and ER visits in previous 12 months Vitals Screenings to include cognitive, depression, and falls Referrals and appointments  In addition, I have reviewed and discussed with patient certain preventive protocols, quality metrics, and best practice recommendations. A written personalized care plan for preventive services as well as general preventive health recommendations were provided to patient.     Jasper Riling, CMA   07/11/2022

## 2022-07-11 NOTE — Patient Instructions (Signed)
  [name] , Thank you for taking time to come for your Medicare Wellness Visit. I appreciate your ongoing commitment to your health goals. Please review the following plan we discussed and let me know if I can assist you in the future.   These are the goals we discussed:  Goals      Patient Stated     None at this time.     Quit smoking / using tobacco        This is a list of the screening recommended for you and due dates:  Health Maintenance  Topic Date Due   Zoster (Shingles) Vaccine (1 of 2) Never done   Colon Cancer Screening  08/05/2019   COVID-19 Vaccine (4 - Moderna series) 12/18/2020   Tetanus Vaccine  05/08/2021   Flu Shot  07/17/2022   Hepatitis C Screening: USPSTF Recommendation to screen - Ages 18-79 yo.  Completed   HIV Screening  Completed   HPV Vaccine  Aged Out

## 2022-07-27 ENCOUNTER — Ambulatory Visit (INDEPENDENT_AMBULATORY_CARE_PROVIDER_SITE_OTHER): Payer: Medicare Other | Admitting: Family Medicine

## 2022-07-27 ENCOUNTER — Encounter: Payer: Self-pay | Admitting: Family Medicine

## 2022-07-27 VITALS — BP 202/170 | HR 145 | Resp 16 | Ht 70.0 in | Wt 141.1 lb

## 2022-07-27 DIAGNOSIS — I10 Essential (primary) hypertension: Secondary | ICD-10-CM

## 2022-07-27 DIAGNOSIS — Z91199 Patient's noncompliance with other medical treatment and regimen due to unspecified reason: Secondary | ICD-10-CM

## 2022-07-27 DIAGNOSIS — I471 Supraventricular tachycardia: Secondary | ICD-10-CM

## 2022-07-27 DIAGNOSIS — E441 Mild protein-calorie malnutrition: Secondary | ICD-10-CM

## 2022-07-27 MED ORDER — DILTIAZEM HCL ER COATED BEADS 120 MG PO CP24: 120.0000 mg | ORAL_CAPSULE | Freq: Every day | ORAL | 2 refills | Status: DC

## 2022-07-27 NOTE — Patient Instructions (Signed)
F/u in 3 weeks, pls bring medications to  visit,  call if you need me sooner  NEED to take your THREE blood pressure medications, amlodipine, cardizem and spironolactone every day ( nurse pls call pharmacy and ask them to fill all 3 , cardizem ios new  Your blood pressure is too high, I DO recommend you go to the hospital now/ today, to get this controlled , you say you are not going now. PLEASE NOTE, if you feel light headed, have a headache, new weakness or numbness you nEED to go dierectly to the ED  I will ask our office manager to look into your bill that you have paid for recent visit  Thanks for choosing [facility], we consider it a privelige to serve you.

## 2022-07-29 ENCOUNTER — Encounter: Payer: Self-pay | Admitting: Family Medicine

## 2022-07-29 DIAGNOSIS — I471 Supraventricular tachycardia, unspecified: Secondary | ICD-10-CM | POA: Insufficient documentation

## 2022-07-29 DIAGNOSIS — Z91199 Patient's noncompliance with other medical treatment and regimen due to unspecified reason: Secondary | ICD-10-CM | POA: Insufficient documentation

## 2022-07-29 NOTE — Assessment & Plan Note (Signed)
Chronic alcoho use , in denial and refusing treatment

## 2022-07-29 NOTE — Assessment & Plan Note (Signed)
Uncontrolled and pt non compliant with treatment. Danger of his markedly elevated blood pressure is discussed. He repeatedly states he will not go to the ED States he will collect and take blood pressure medications today Denies recent alcohol use

## 2022-07-29 NOTE — Assessment & Plan Note (Signed)
Needs re evaluation and maagement by Cardiology , will refer

## 2022-07-29 NOTE — Progress Notes (Signed)
   PAO HAFFEY     MRN: [id number]      DOB: 09-14-1956   HPI [name] is here for follow up and re-evaluation of uncontrolled hypertension. States [name] last had alcohol 1 week ago Call to the pharmacy reveals [name] has not picked up medication prescribed C/o stress over  bills from the office which [name] paid, and also from the hospital  Denies headache , blurred vision, numbness or weakness , feels his normal self just " aggravated" Refuses to go to the ED for evaluation and management  despite my repeated request , I explain that his blood pressure and heart rate are dangerously high, states [name] is not going and will go if [name] feels ill ROS Denies recent fever or chills. Denies sinus pressure, nasal congestion, ear pain or sore throat. Denies chest congestion, productive cough or wheezing. Denies chest pains, palpitations and leg swelling Denies abdominal pain, nausea, vomiting,diarrhea or constipation.   Denies dysuria, frequency, hesitancy or incontinence. Denies joint pain, swelling and limitation in mobility. Denies headaches, seizures, numbnes or weakness. Denies skin break down or rash.   PE  BP (!) 202/170 (BP Location: Left Arm, Patient Position: Sitting, Cuff Size: Normal)   Pulse (!) 145   Resp 16   Ht 5\' 10"  (1.778 m)   Wt 141 lb 1.9 oz (64 kg)   SpO2 97%   BMI 20.25 kg/m   Patient alert and oriented .  HEENT: No facial asymmetry, EOMI,     Neck supple .  Chest: Clear to auscultation bilaterally.Decreased air entry  CVS: S1, S2 no murmurs, no S3.Regular rate.  ABD: Soft non tender.   Ext: No edema  MS: Adequate ROM spine, shoulders, hips and knees.  Skin: Intact, no ulcerations or rash noted.  Psych: Good eye contact, normal affect. Memory  anxious not depressed appearing.States not afraid of dying and [name] feels that no one cares about him. Not actively suicidal or homicidal  CNS: CN 2-12 intact, power,  normal throughout.no focal deficits  noted.   Assessment & Plan  Malignant hypertension Uncontrolled and pt non compliant with treatment. Danger of his markedly elevated blood pressure is discussed. [name] repeatedly states [name] will not go to the ED States [name] will collect and take blood pressure medications today Denies recent alcohol use   MALNUTRITION, MILD Chronic alcoho use , in denial and refusing treatment  Non-compliance with treatment Malignant hTN, not filling prescriptions and presenting with dangerously elevated blood pressure and heart rate , refusing ED evaluation. Real concern re my ability to care for patient, if presents non compliant once more  Will [facility] to [facility] for assistance   PSVT (paroxysmal supraventricular tachycardia) (HCC) Needs re evaluation and maagement by Cardiology , will refer

## 2022-07-29 NOTE — Assessment & Plan Note (Signed)
Malignant hTN, not filling prescriptions and presenting with dangerously elevated blood pressure and heart rate , refusing ED evaluation. Real concern re my ability to care for patient, if presents non compliant once more  Will refer to [facility] for assistance

## 2022-07-30 ENCOUNTER — Telehealth: Payer: Self-pay | Admitting: Family Medicine

## 2022-07-30 NOTE — Telephone Encounter (Signed)
Patient came by the office 8/11 and brought a copy of his bill.  It does not look like Medicare paid the amount they should have.  I have sent this to Limmie Patricia in Insurance to take a look at it.  Waiting to hear back from her.

## 2022-08-23 ENCOUNTER — Ambulatory Visit: Payer: Medicare Other | Admitting: Family Medicine

## 2022-09-08 ENCOUNTER — Encounter (HOSPITAL_COMMUNITY): Payer: Self-pay | Admitting: Emergency Medicine

## 2022-09-08 ENCOUNTER — Emergency Department (HOSPITAL_COMMUNITY): Payer: Medicare Other

## 2022-09-08 ENCOUNTER — Emergency Department (HOSPITAL_COMMUNITY)
Admission: EM | Admit: 2022-09-08 | Discharge: 2022-09-08 | Disposition: A | Discharge status: Home / Self Care | Payer: Medicare Other | Attending: Emergency Medicine | Admitting: Emergency Medicine

## 2022-09-08 DIAGNOSIS — J45909 Unspecified asthma, uncomplicated: Secondary | ICD-10-CM | POA: Insufficient documentation

## 2022-09-08 DIAGNOSIS — S0990XA Unspecified injury of head, initial encounter: Secondary | ICD-10-CM

## 2022-09-08 DIAGNOSIS — I10 Essential (primary) hypertension: Secondary | ICD-10-CM | POA: Insufficient documentation

## 2022-09-08 DIAGNOSIS — Y908 Blood alcohol level of 240 mg/100 ml or more: Secondary | ICD-10-CM | POA: Diagnosis not present

## 2022-09-08 DIAGNOSIS — S0101XA Laceration without foreign body of scalp, initial encounter: Secondary | ICD-10-CM | POA: Insufficient documentation

## 2022-09-08 DIAGNOSIS — Z23 Encounter for immunization: Secondary | ICD-10-CM | POA: Diagnosis not present

## 2022-09-08 DIAGNOSIS — F10120 Alcohol abuse with intoxication, uncomplicated: Secondary | ICD-10-CM | POA: Insufficient documentation

## 2022-09-08 DIAGNOSIS — F10129 Alcohol abuse with intoxication, unspecified: Secondary | ICD-10-CM

## 2022-09-08 DIAGNOSIS — W01198A Fall on same level from slipping, tripping and stumbling with subsequent striking against other object, initial encounter: Secondary | ICD-10-CM | POA: Insufficient documentation

## 2022-09-08 DIAGNOSIS — W19XXXA Unspecified fall, initial encounter: Secondary | ICD-10-CM

## 2022-09-08 DIAGNOSIS — J449 Chronic obstructive pulmonary disease, unspecified: Secondary | ICD-10-CM | POA: Insufficient documentation

## 2022-09-08 DIAGNOSIS — R45851 Suicidal ideations: Secondary | ICD-10-CM | POA: Insufficient documentation

## 2022-09-08 LAB — CBC WITH DIFFERENTIAL/PLATELET
Abs Immature Granulocytes: 0.09 10*3/uL — ABNORMAL HIGH (ref 0.00–0.07)
Basophils Absolute: 0.1 10*3/uL (ref 0.0–0.1)
Basophils Relative: 1 %
Eosinophils Absolute: 0.1 10*3/uL (ref 0.0–0.5)
Eosinophils Relative: 1 %
HCT: 42.3 % (ref 39.0–52.0)
Hemoglobin: 14.4 g/dL (ref 13.0–17.0)
Immature Granulocytes: 1 %
Lymphocytes Relative: 39 %
Lymphs Abs: 2.7 10*3/uL (ref 0.7–4.0)
MCH: 29.5 pg (ref 26.0–34.0)
MCHC: 34 g/dL (ref 30.0–36.0)
MCV: 86.7 fL (ref 80.0–100.0)
Monocytes Absolute: 0.6 10*3/uL (ref 0.1–1.0)
Monocytes Relative: 9 %
Neutro Abs: 3.4 10*3/uL (ref 1.7–7.7)
Neutrophils Relative %: 49 %
Platelets: 266 10*3/uL (ref 150–400)
RBC: 4.88 MIL/uL (ref 4.22–5.81)
RDW: 13.1 % (ref 11.5–15.5)
WBC: 6.9 10*3/uL (ref 4.0–10.5)
nRBC: 0 % (ref 0.0–0.2)

## 2022-09-08 LAB — COMPREHENSIVE METABOLIC PANEL
ALT: 27 U/L (ref 0–44)
AST: 33 U/L (ref 15–41)
Albumin: 4.6 g/dL (ref 3.5–5.0)
Alkaline Phosphatase: 61 U/L (ref 38–126)
Anion gap: 7 (ref 5–15)
BUN: 6 mg/dL — ABNORMAL LOW (ref 8–23)
CO2: 24 mmol/L (ref 22–32)
Calcium: 8.2 mg/dL — ABNORMAL LOW (ref 8.9–10.3)
Chloride: 98 mmol/L (ref 98–111)
Creatinine, Ser: 0.81 mg/dL (ref 0.61–1.24)
GFR, Estimated: >60 mL/min (ref >60)
Glucose, Bld: 97 mg/dL (ref 70–99)
Potassium: 4.2 mmol/L (ref 3.5–5.1)
Sodium: 129 mmol/L — ABNORMAL LOW (ref 135–145)
Total Bilirubin: 0.5 mg/dL (ref 0.3–1.2)
Total Protein: 7.9 g/dL (ref 6.5–8.1)

## 2022-09-08 LAB — RAPID URINE DRUG SCREEN, HOSP PERFORMED
Amphetamines: NOT DETECTED
Barbiturates: NOT DETECTED
Benzodiazepines: NOT DETECTED
Cocaine: NOT DETECTED
Opiates: NOT DETECTED
Tetrahydrocannabinol: NOT DETECTED

## 2022-09-08 LAB — ACETAMINOPHEN LEVEL: Acetaminophen (Tylenol), Serum: <10 ug/mL — ABNORMAL LOW (ref 10–30)

## 2022-09-08 LAB — ETHANOL: Alcohol, Ethyl (B): 373 mg/dL (ref <10)

## 2022-09-08 LAB — SALICYLATE LEVEL: Salicylate Lvl: <7 mg/dL — ABNORMAL LOW (ref 7.0–30.0)

## 2022-09-08 MED ORDER — LIDOCAINE-EPINEPHRINE (PF) 2 %-1:200000 IJ SOLN
10.0000 mL | Freq: Once | INTRAMUSCULAR | Status: AC
  Administered 2022-09-08: 10 mL
  Filled 2022-09-08: qty 20

## 2022-09-08 MED ORDER — TETANUS-DIPHTH-ACELL PERTUSSIS 5-2.5-18.5 LF-MCG/0.5 IM SUSY
0.5000 mL | PREFILLED_SYRINGE | Freq: Once | INTRAMUSCULAR | Status: AC
  Administered 2022-09-08: 0.5 mL via INTRAMUSCULAR
  Filled 2022-09-08: qty 0.5

## 2022-09-08 NOTE — ED Notes (Signed)
Date and time results received: 09/08/22 2106   Test: ETOH Critical Value: 373  Name of Provider Notified: Nanda Quinton MD

## 2022-09-08 NOTE — ED Provider Notes (Signed)
Patient awake and alert, sitting on the edge of the bed.  He is fully oriented.  Patient asking to be discharged.  His sister is on her way to pick him up.  Patient once again has confirmed that he is not homicidal or suicidal.  He will be appropriate for discharge into the care of his sister.   Orpah Greek, MD 09/08/22 [phone]

## 2022-09-08 NOTE — ED Notes (Signed)
Pt walks up to nurses station saying he is ready to go home, pt sister, Hassan Rowan called and spoke with this nurse questioning the status of pt, informed Hassan Rowan that pt will be discharged, however needs a ride home as he is still intoxicated, pt able to walk- steady gait noted. Dr Betsey Holiday made aware.

## 2022-09-08 NOTE — ED Provider Notes (Signed)
Emergency Department Provider Note   I have reviewed the triage vital signs and the nursing notes.   HISTORY  Chief Complaint Fall   HPI [name] is a 66 y.o. male past history of hypertension, COPD, alcohol abuse presents to the emergency department after fall with head trauma.  Patient states he was drinking with friends and watching football.  He was walking back home and tripped going up steps causing him to fall backwards striking the back of his head.  No loss of consciousness.  Bleeding controlled with EMS on scene.  He is not having pain in the arms or legs.  He is having some lower back discomfort as well denies numbness/weakness.  Pain somewhat worse with movement. Not UTD on tetanus.   According to EMS, and asking routine questions about SI, he initially reported some SI. He denies that adamantly to me now. He did not verbalize any active SI or plan per EMS.    Past Medical History:  Diagnosis Date   Alcohol abuse    Allergic rhinitis    Asthma    COPD (chronic obstructive pulmonary disease) (HCC)    Coronary atherosclerosis    Based on chest CT 2012   Essential hypertension    History of neutropenia    Also lymphocytosis - followed by Dr. Zigmund Daniel   Nicotine addiction    Olecranon bursitis, right elbow    PSVT (paroxysmal supraventricular tachycardia) (HCC)    Document in April 2022    Review of Systems  Constitutional: No fever/chills Eyes: No visual changes. ENT: No sore throat. Cardiovascular: Denies chest pain. Respiratory: Denies shortness of breath. Gastrointestinal: No abdominal pain.  No nausea, no vomiting.  No diarrhea.  No constipation. Genitourinary: Negative for dysuria. Musculoskeletal: Positive for back pain. Skin: Negative for rash. Positive posterior scalp laceration.  Neurological: Negative for focal weakness or numbness. Positive HA.    ____________________________________________   PHYSICAL EXAM:  VITAL SIGNS: ED  Triage Vitals  Enc Vitals Group     BP 09/08/22 2004 (!) 135/104     Pulse Rate 09/08/22 2004 78     Resp 09/08/22 2004 18     Temp 09/08/22 2004 97.7 F (36.5 C)     Temp Source 09/08/22 2004 Oral     SpO2 09/08/22 2004 100 %     Weight 09/08/22 1959 141 lb 1.5 oz (64 kg)     Height 09/08/22 1959 5\' 10"  (1.778 m)    Constitutional: Alert but appears intoxicated but cooperative. Well appearing and in no acute distress. Eyes: Conjunctivae are normal. PERRL.  Head: 6 cm laceration to the posterior scalp.  Nose: No congestion/rhinnorhea. Mouth/Throat: Mucous membranes are moist.   Neck: No stridor. No cervical spine tenderness to palpation. Cardiovascular: Normal rate, regular rhythm. Good peripheral circulation. Grossly normal heart sounds.   Respiratory: Normal respiratory effort.  No retractions. Lungs CTAB. Gastrointestinal: Soft and nontender. No distention.  Musculoskeletal: No lower extremity tenderness nor edema. No gross deformities of extremities.  Range of motion of the bilateral upper and lower extremities.  Neurologic:  Normal speech and language. No gross focal neurologic deficits are appreciated.  Skin:  Skin is warm, dry and intact. No rash noted.  ____________________________________________   LABS (all labs ordered are listed, but only abnormal results are displayed)  Labs Reviewed  COMPREHENSIVE METABOLIC PANEL  ACETAMINOPHEN LEVEL  ETHANOL  SALICYLATE LEVEL  CBC WITH DIFFERENTIAL/PLATELET  RAPID URINE DRUG SCREEN, HOSP PERFORMED   ____________________________________________  EKG  ***  ____________________________________________  RADIOLOGY  No results found.  ____________________________________________   PROCEDURES  Procedure(s) performed:   Marland KitchenMarland KitchenLaceration Repair  Date/Time: 09/08/2022 9:51 PM  Performed by: Maia Plan, MD Authorized by: Maia Plan, MD   Consent:    Consent obtained:  Verbal   Consent given by:  Patient   Risks,  benefits, and alternatives were discussed: yes     Risks discussed:  Infection, need for additional repair, poor wound healing, pain, poor cosmetic result, retained foreign body and nerve damage Universal protocol:    Patient identity confirmed:  Verbally with patient Anesthesia:    Anesthesia method:  Local infiltration   Local anesthetic:  Lidocaine 2% WITH epi Laceration details:    Location:  Scalp   Scalp location:  Occipital   Length (cm):  10   Depth (mm):  10 Pre-procedure details:    Preparation:  Patient was prepped and draped in usual sterile fashion and imaging obtained to evaluate for foreign bodies Exploration:    Limited defect created (wound extended): no     Hemostasis achieved with:  Direct pressure   Imaging obtained comment:  CT head   Imaging outcome: foreign body not noted     Wound exploration: entire depth of wound visualized     Wound extent: fascia violated     Wound extent: no foreign bodies/material noted, no nerve damage noted, no tendon damage noted, no underlying fracture noted and no vascular damage noted     Contaminated: no   Treatment:    Area cleansed with:  Povidone-iodine and saline   Amount of cleaning:  Standard   Irrigation solution:  Sterile saline   Irrigation volume:  500 mL   Irrigation method:  Pressure wash   Visualized foreign bodies/material removed: no     Debridement:  None   Layers/structures repaired:  Vernona Rieger:    Suture size:  5-0   Suture material:  Plain gut   Suture technique:  Simple interrupted   Number of sutures:  2 Skin repair:    Repair method:  Staples   Number of staples:  9 Approximation:    Approximation:  Close Repair type:    Repair type:  Intermediate Post-procedure details:    Dressing:  Open (no dressing)   Procedure completion:  Tolerated well, no immediate complications    ____________________________________________   INITIAL IMPRESSION / ASSESSMENT AND PLAN / ED COURSE  Pertinent  labs & imaging results that were available during my care of the patient were reviewed by me and considered in my medical decision making (see chart for details).   This patient is Presenting for Evaluation of head injury, which does require a range of treatment options, and is a complaint that involves a high risk of morbidity and mortality.  The Differential Diagnoses includes subdural hematoma, epidural hematoma, acute concussion, traumatic subarachnoid hemorrhage, cerebral contusions, etc.   Critical Interventions-    Medications  Tdap (BOOSTRIX) injection 0.5 mL (has no administration in time range)  lidocaine-EPINEPHrine (XYLOCAINE W/EPI) 2 %-1:200000 (PF) injection 10 mL (has no administration in time range)    Reassessment after intervention:     I did obtain Additional Historical Information from EMS.  I decided to review pertinent External Data, and in summary no recent ED visits for similar.   Clinical Laboratory Tests Ordered, included ***  Radiologic Tests Ordered, included CT head and c spine. I independently interpreted the images and agree with radiology interpretation.   Social Determinants of Health Risk  patient with EtOH abuse and smoking.   Consult complete with  Medical Decision Making: Summary:  Patient presents emergency department after what sounds to be mechanical fall with head injury.  He is intoxicated and endorses drinking heavily with friends today.  Plan for CT imaging of his head and repair of his scalp laceration.  He apparently endorsed history of suicidal thoughts with EMS but denied any plan.  To me, he denies any suicidal ideation, active or past.  Plan for screening blood work but plan for reassessment once more sober.   Reevaluation with update and discussion with   ***Considered admission***  Disposition:   ____________________________________________  FINAL CLINICAL IMPRESSION(S) / ED DIAGNOSES  Final diagnoses:  None     NEW  OUTPATIENT MEDICATIONS STARTED DURING THIS VISIT:  New Prescriptions   No medications on file    Note:  This document was prepared using Dragon voice recognition software and may include unintentional dictation errors.  Nanda Quinton, MD, [facility]

## 2022-09-08 NOTE — ED Notes (Signed)
ED Provider at bedside. 

## 2022-09-08 NOTE — ED Triage Notes (Signed)
Pt BIB [facility] from home after mechanical fall. Pt obviously intoxicated. Pt also endorses suicidal thoughts but denies any intention to self harm.

## 2022-09-15 ENCOUNTER — Encounter (HOSPITAL_COMMUNITY): Payer: Self-pay | Admitting: *Deleted

## 2022-09-15 ENCOUNTER — Other Ambulatory Visit: Payer: Self-pay

## 2022-09-15 ENCOUNTER — Emergency Department (HOSPITAL_COMMUNITY)
Admission: EM | Admit: 2022-09-15 | Discharge: 2022-09-15 | Disposition: A | Discharge status: Home / Self Care | Payer: Medicare Other | Attending: Emergency Medicine | Admitting: Emergency Medicine

## 2022-09-15 DIAGNOSIS — Z4802 Encounter for removal of sutures: Secondary | ICD-10-CM | POA: Insufficient documentation

## 2022-09-15 DIAGNOSIS — S0101XD Laceration without foreign body of scalp, subsequent encounter: Secondary | ICD-10-CM | POA: Insufficient documentation

## 2022-09-15 DIAGNOSIS — X58XXXD Exposure to other specified factors, subsequent encounter: Secondary | ICD-10-CM | POA: Insufficient documentation

## 2022-09-15 NOTE — ED Provider Notes (Signed)
[facility] EMERGENCY DEPARTMENT Provider Note   CSN: [id number] Arrival date & time: 09/15/22  1628     History  Chief Complaint  Patient presents with   Suture / Staple Removal    [name] is a 66 y.o. male.   Suture / Staple Removal   66 year old male presents emergency department for staple removal.  He had a fall on 09/08/2022 which resulted in 9 staples placed in the occipital region of his scalp.  He reports no complications of laceration.  No active drainage, redness, intense pain, fever noted.  He comes for reevaluation removal of sutures.  Home Medications Prior to Admission medications   Medication Sig Start Date End Date Taking? Authorizing Provider  albuterol (VENTOLIN HFA) 108 (90 Base) MCG/ACT inhaler Inhale 2 puffs into the lungs every 6 (six) hours as needed for wheezing or shortness of breath. 11/30/20   Fayrene Helper, MD  amLODipine (NORVASC) 10 MG tablet Take 1 tablet (10 mg total) by mouth daily. 06/22/22   Fayrene Helper, MD  busPIRone (BUSPAR) 5 MG tablet Take 1 tablet (5 mg total) by mouth 3 (three) times daily. 11/30/20   Fayrene Helper, MD  diltiazem (CARDIZEM CD) 120 MG 24 hr capsule Take 1 capsule (120 mg total) by mouth daily. 07/27/22   Fayrene Helper, MD  ergocalciferol (VITAMIN D2) 1.25 MG (50000 UT) capsule Take 1 capsule (50,000 Units total) by mouth once a week. One capsule once weekly 04/29/22   Fayrene Helper, MD  ipratropium-albuterol (DUONEB) 0.5-2.5 (3) MG/3ML SOLN INHALE 1 VIAL VIA NEBULIZER EVERY SIX HOURS AS NEEDED FOR WHEEZING/ SHORTNESS OF BREATH 04/12/21   Fayrene Helper, MD  spironolactone (ALDACTONE) 25 MG tablet Take 1 tablet (25 mg total) by mouth daily. 06/22/22   Fayrene Helper, MD  fluticasone (FLOVENT DISKUS) 50 MCG/BLIST diskus inhaler Inhale 1 puff into the lungs 2 (two) times daily. 12/13/20 02/12/21  Fayrene Helper, MD      Allergies    Lisinopril    Review of Systems   Review of  Systems  All other systems reviewed and are negative.   Physical Exam Updated Vital Signs BP (!) 160/98 (BP Location: Right Arm)   Pulse 81   Temp 98.6 F (37 C) (Oral)   Resp 16   SpO2 100%  Physical Exam Vitals and nursing note reviewed.  Constitutional:      General: He is not in acute distress.    Appearance: He is well-developed.  HENT:     Head: Normocephalic and atraumatic.  Eyes:     Conjunctiva/sclera: Conjunctivae normal.  Cardiovascular:     Rate and Rhythm: Normal rate and regular rhythm.  Pulmonary:     Effort: Pulmonary effort is normal. No respiratory distress.     Breath sounds: Normal breath sounds.  Abdominal:     Palpations: Abdomen is soft.     Tenderness: There is no abdominal tenderness.  Musculoskeletal:        General: No swelling.     Cervical back: Neck supple.  Skin:    General: Skin is warm and dry.     Capillary Refill: Capillary refill takes less than 2 seconds.     Comments: Wound on posterior scalp well approximated.  No surrounding erythema, palpable fluctuance or induration.  No active drainage from site.  Neurological:     Mental Status: He is alert.  Psychiatric:        Mood and Affect: Mood normal.  ED Results / Procedures / Treatments   Labs (all labs ordered are listed, but only abnormal results are displayed) Labs Reviewed - No data to display  EKG None  Radiology No results found.  Procedures .Suture Removal  Date/Time: 09/15/2022 5:22 PM  Performed by: Wilnette Kales, PA Authorized by: Wilnette Kales, PA   Consent:    Consent obtained:  Verbal   Consent given by:  Patient   Risks, benefits, and alternatives were discussed: yes     Risks discussed:  Bleeding, pain and wound separation   Alternatives discussed:  No treatment, delayed treatment, alternative treatment, observation and referral Universal protocol:    Procedure explained and questions answered to patient or proxy's satisfaction: yes      Patient identity confirmed:  Verbally with patient Location:    Location:  Head/neck   Head/neck location:  Scalp Procedure details:    Wound appearance:  No signs of infection, good wound healing and clean   Number of staples removed:  9 Post-procedure details:    Post-removal:  Antibiotic ointment applied and no dressing applied   Procedure completion:  Tolerated well, no immediate complications     Medications Ordered in ED Medications - No data to display  ED Course/ Medical Decision Making/ A&P                           Medical Decision Making  This patient presents to the ED for concern of suture removal, this involves an extensive number of treatment options, and is a complaint that carries with it a high risk of complications and morbidity.  The differential diagnosis includes cellulitis, erysipelas, abscess formation, wound dehiscence   Co morbidities that complicate the patient evaluation  See HPI   Additional history obtained:  Additional history obtained from EMR External records from outside source obtained and reviewed including ED note from 9/23 indicating how many staples were placed.   Lab Tests:  N/a   Imaging Studies ordered:  N/a   Cardiac Monitoring: / EKG:  The patient was maintained on a cardiac monitor.  I personally viewed and interpreted the cardiac monitored which showed an underlying rhythm of: Sinus rhythm   Consultations Obtained:  N/a   Problem List / ED Course / Critical interventions / Medication management  Staple removal Reevaluation of the patient showed that the patient improved I have reviewed the patients home medicines and have made adjustments as needed   Social Determinants of Health:  Chronic cigarette use.  Denies illicit drug use.   Test / Admission - Considered:  Stable removal Vitals signs significant for hypertension with blood pressure 160/98.  Recommend close follow-up with PCP regarding elevation  of blood pressure.. Otherwise within normal range and stable throughout visit. 9 staples were removed.  Wound is well approximated with no signs of infection.  Area cleaned prior to and after staple removal.  Antibiotic ointment was applied over area.  Patient recommended reevaluation by PCP in 3 to 5 days.  Treatment plan discussed at length with patient he knowledge understanding was agreeable to said plan. Worrisome signs and symptoms were discussed with the patient, and the patient acknowledged understanding to return to the ED if noticed. Patient was stable upon discharge.          Final Clinical Impression(s) / ED Diagnoses Final diagnoses:  Removal of staples    Rx / DC Orders ED Discharge Orders     None  Wilnette Kales, [place] 09/15/22 1724    Audley Hose, MD 09/16/22 [phone]

## 2022-09-15 NOTE — ED Triage Notes (Signed)
Pt here for staple removal to scalp. Pt denies any problems

## 2022-09-21 ENCOUNTER — Telehealth: Payer: Self-pay

## 2022-09-21 NOTE — Telephone Encounter (Signed)
   Reason for call: ED-Follow up Visit   Patient  visit on 09/15/2022  at [facility]   Have you been able to follow up with your primary care physician? Appt scheduled  The patient was or was not able to obtain any needed medicine or equipment. - N/A  Are there diet recommendations that you are having difficulty following? - No  Patient expresses understanding of discharge instructions and education provided has no other needs at this time.    [place] management  [place], [place]  Main Phone: [phone]  E-mail: [email or web address]  Website: [email or web address]

## 2022-10-02 ENCOUNTER — Ambulatory Visit (INDEPENDENT_AMBULATORY_CARE_PROVIDER_SITE_OTHER): Payer: Medicare Other | Admitting: Family Medicine

## 2022-10-02 ENCOUNTER — Encounter: Payer: Self-pay | Admitting: Family Medicine

## 2022-10-02 VITALS — BP 110/80 | HR 85 | Ht 66.0 in | Wt 142.0 lb

## 2022-10-02 DIAGNOSIS — F10259 Alcohol dependence with alcohol-induced psychotic disorder, unspecified: Secondary | ICD-10-CM

## 2022-10-02 DIAGNOSIS — Z23 Encounter for immunization: Secondary | ICD-10-CM | POA: Diagnosis not present

## 2022-10-02 DIAGNOSIS — Z1211 Encounter for screening for malignant neoplasm of colon: Secondary | ICD-10-CM

## 2022-10-02 DIAGNOSIS — I10 Essential (primary) hypertension: Secondary | ICD-10-CM | POA: Diagnosis not present

## 2022-10-02 DIAGNOSIS — F17218 Nicotine dependence, cigarettes, with other nicotine-induced disorders: Secondary | ICD-10-CM

## 2022-10-02 DIAGNOSIS — F418 Other specified anxiety disorders: Secondary | ICD-10-CM | POA: Diagnosis not present

## 2022-10-02 DIAGNOSIS — F411 Generalized anxiety disorder: Secondary | ICD-10-CM

## 2022-10-02 NOTE — Assessment & Plan Note (Signed)
Controlled, no change in medication  

## 2022-10-02 NOTE — Assessment & Plan Note (Signed)
Importance of remaining alcohol free is discussed and stressed, states he plans to do this

## 2022-10-02 NOTE — Progress Notes (Signed)
   SHIVANSH HARDAWAY     MRN: [id number]      DOB: 1956-05-05   HPI Mr. Prasad is here for follow up and re-evaluation of chronic medical conditions, medication management and review of any available recent lab and radiology data.  Preventive health is updated, specifically  Cancer screening and Immunization.   The PT denies any adverse reactions to current medications since the last visit.  There are no new concerns.  There are no specific complaints  Was in the ED in Sept with 30 times normal alcohol level and head injury, states no EtoH since then  ROS Denies recent fever or chills. Denies sinus pressure, nasal congestion, ear pain or sore throat. Denies chest congestion, productive cough or wheezing. Denies chest pains, palpitations and leg swelling Denies abdominal pain, nausea, vomiting,diarrhea or constipation.   Denies dysuria, frequency, hesitancy or incontinence. Denies joint pain, swelling and limitation in mobility. Denies headaches, seizures, numbness, or tingling. Denies depression, uncontrolled anxiety or insomnia. Denies skin break down or rash.   PE  BP 110/80   Pulse 85   Ht 5\' 6"  (1.676 m)   Wt 142 lb 0.6 oz (64.4 kg)   SpO2 99%   BMI 22.93 kg/m   Patient alert and oriented and in no cardiopulmonary distress.  HEENT: No facial asymmetry, EOMI,     Neck supple .  Chest: Clear to auscultation bilaterally.  CVS: S1, S2 no murmurs, no S3.Regular rate.  ABD: Soft non tender.   Ext: No edema  MS: Adequate ROM spine, shoulders, hips and knees.  Skin: Intact, no ulcerations or rash noted.  Psych: Good eye contact, normal affect. Memory intact not anxious or depressed appearing.  CNS: CN 2-12 intact, power,  normal throughout.no focal deficits noted.   Assessment & Plan Malignant hypertension Controlled, no change in medication DASH diet and commitment to daily physical activity for a minimum of 30 minutes discussed and encouraged, as a part of  hypertension management. The importance of attaining a healthy weight is also discussed.     10/02/2022    3:59 PM 10/02/2022    3:43 PM 09/15/2022    4:56 PM 09/08/2022   11:20 PM 09/08/2022    8:04 PM 09/08/2022    7:59 PM 07/27/2022    2:16 PM  BP/Weight  Systolic BP 742 595 638 756 433  295  Diastolic BP 80 84 98 88 188  170  Wt. 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

## 2022-10-02 NOTE — Assessment & Plan Note (Signed)
Controlled, no change in medication DASH diet and commitment to daily physical activity for a minimum of 30 minutes discussed and encouraged, as a part of hypertension management. The importance of attaining a healthy weight is also discussed.     10/02/2022    3:59 PM 10/02/2022    3:43 PM 09/15/2022    4:56 PM 09/08/2022   11:20 PM 09/08/2022    8:04 PM 09/08/2022    7:59 PM 07/27/2022    2:16 PM  BP/Weight  Systolic BP 518 841 660 630 160  109  Diastolic BP 80 84 98 88 323  170  Wt. (Lbs)  142.04    141.09   BMI  22.93 kg/m2    20.24 kg/m2

## 2022-10-02 NOTE — Patient Instructions (Signed)
F/u end Feb, call if you need me sooner  Flu vaccine today  Please get covid vaccine at your pharmacy in next 1 to 2 weeks  Nurse please arrange cologuard, and tell him where to drop ooff once done, encourage him to get the test done, says he will  BP  is good, stay on medication as prescribed  STAY AWAY  from alcohol and avoid stress.  Hope family members, mom and Sis get better  Thanks for choosing [facility], we consider it a privelige to serve you.

## 2022-10-02 NOTE — Assessment & Plan Note (Signed)
Asked:confirms currently smokes cigarettes °Assess: Unwilling to set a quit date, but is cutting back °Advise: needs to QUIT to reduce risk of cancer, cardio and cerebrovascular disease °Assist: counseled for 5 minutes and literature provided °Arrange: follow up in 2 to 4 months ° °

## 2022-10-24 ENCOUNTER — Other Ambulatory Visit: Payer: Self-pay | Admitting: Cardiology

## 2022-10-24 ENCOUNTER — Telehealth: Payer: Self-pay | Admitting: Family Medicine

## 2022-10-24 ENCOUNTER — Other Ambulatory Visit: Payer: Self-pay

## 2022-10-24 MED ORDER — AMLODIPINE BESYLATE 10 MG PO TABS: 10.0000 mg | ORAL_TABLET | Freq: Every day | ORAL | 2 refills | Status: DC

## 2022-10-24 NOTE — Telephone Encounter (Signed)
Patient needs refill on   amLODipine (NORVASC) 10 MG tablet    [facility] - [place], [place] - [address], [place] [address] Phone: [phone]  Fax: [phone]

## 2022-10-24 NOTE — Telephone Encounter (Signed)
Refills sent

## 2023-01-15 ENCOUNTER — Other Ambulatory Visit: Payer: Self-pay | Admitting: Family Medicine

## 2023-02-12 ENCOUNTER — Ambulatory Visit: Payer: Medicare Other | Admitting: Family Medicine

## 2023-03-22 ENCOUNTER — Encounter: Payer: Self-pay | Admitting: Family Medicine

## 2023-03-22 ENCOUNTER — Ambulatory Visit (INDEPENDENT_AMBULATORY_CARE_PROVIDER_SITE_OTHER): Payer: Medicare Other | Admitting: Family Medicine

## 2023-03-22 VITALS — BP 120/70 | HR 100 | Ht 66.0 in | Wt 137.0 lb

## 2023-03-22 DIAGNOSIS — R7301 Impaired fasting glucose: Secondary | ICD-10-CM | POA: Diagnosis not present

## 2023-03-22 DIAGNOSIS — Z125 Encounter for screening for malignant neoplasm of prostate: Secondary | ICD-10-CM

## 2023-03-22 DIAGNOSIS — Z1211 Encounter for screening for malignant neoplasm of colon: Secondary | ICD-10-CM

## 2023-03-22 DIAGNOSIS — Z13220 Encounter for screening for lipoid disorders: Secondary | ICD-10-CM

## 2023-03-22 DIAGNOSIS — F10259 Alcohol dependence with alcohol-induced psychotic disorder, unspecified: Secondary | ICD-10-CM | POA: Diagnosis not present

## 2023-03-22 DIAGNOSIS — F17210 Nicotine dependence, cigarettes, uncomplicated: Secondary | ICD-10-CM | POA: Diagnosis not present

## 2023-03-22 DIAGNOSIS — E441 Mild protein-calorie malnutrition: Secondary | ICD-10-CM

## 2023-03-22 DIAGNOSIS — Z1212 Encounter for screening for malignant neoplasm of rectum: Secondary | ICD-10-CM

## 2023-03-22 DIAGNOSIS — I10 Essential (primary) hypertension: Secondary | ICD-10-CM | POA: Diagnosis not present

## 2023-03-22 DIAGNOSIS — J432 Centrilobular emphysema: Secondary | ICD-10-CM

## 2023-03-22 DIAGNOSIS — E559 Vitamin D deficiency, unspecified: Secondary | ICD-10-CM | POA: Diagnosis not present

## 2023-03-22 DIAGNOSIS — F418 Other specified anxiety disorders: Secondary | ICD-10-CM

## 2023-03-22 MED ORDER — DILTIAZEM HCL ER COATED BEADS 120 MG PO CP24: 120.0000 mg | ORAL_CAPSULE | Freq: Every day | ORAL | 6 refills | Status: DC

## 2023-03-22 MED ORDER — SPIRONOLACTONE 25 MG PO TABS: 25.0000 mg | ORAL_TABLET | Freq: Every day | ORAL | 6 refills | Status: DC

## 2023-03-22 NOTE — Patient Instructions (Addendum)
F/U in 6 months, call if you need me sooner  B Pis excellent, continue meds  Please work on stopping smoking  Please get fasting labs 1 week before next  visit  cBC, lipid, cmp and EGFr , TSH and vit D prostate screen  Thanks for choosing [facility], we consider it a privelige to serve you.

## 2023-03-23 ENCOUNTER — Encounter: Payer: Self-pay | Admitting: Family Medicine

## 2023-03-23 MED ORDER — ROSUVASTATIN CALCIUM 5 MG PO TABS: 5.0000 mg | ORAL_TABLET | Freq: Every day | ORAL | 0 refills | Status: DC

## 2023-03-23 MED ORDER — AMLODIPINE BESYLATE 10 MG PO TABS: 10.0000 mg | ORAL_TABLET | Freq: Every day | ORAL | 6 refills | Status: DC

## 2023-03-23 NOTE — Assessment & Plan Note (Signed)
Updated lab needed at/ before next visit.   

## 2023-03-23 NOTE — Assessment & Plan Note (Signed)
Does not use inhalers regularly and denies any recent decompensation as far as shotrness of breath or wheezing are concerned

## 2023-03-23 NOTE — Assessment & Plan Note (Signed)
Controlled, no change in medication DASH diet and commitment to daily physical activity for a minimum of 30 minutes discussed and encouraged, as a part of hypertension management. The importance of attaining a healthy weight is also discussed.     03/22/2023    4:35 PM 03/22/2023    3:52 PM 10/02/2022    3:59 PM 10/02/2022    3:43 PM 09/15/2022    4:56 PM 09/08/2022   11:20 PM 09/08/2022    8:04 PM  BP/Weight  Systolic BP 120 130 110 129 160 115 135  Diastolic BP 70 87 80 84 98 88 250  Wt. (Lbs)  137  142.04     BMI  22.11 kg/m2  22.93 kg/m2

## 2023-03-23 NOTE — Assessment & Plan Note (Signed)
Asked:confirms currently smokes cigarettes Assess: Unwilling to set a quit date, but is trying to cut  back Advise: needs to QUIT to reduce risk of cancer, cardio and cerebrovascular disease Assist: counseled for 5 minutes and literature provided Arrange: follow up in 2 to 4 months Refuses chest scan

## 2023-03-23 NOTE — Assessment & Plan Note (Signed)
Has gained weight, no ;onger malnourished

## 2023-03-23 NOTE — Assessment & Plan Note (Signed)
Resolved and on no medication 

## 2023-03-23 NOTE — Assessment & Plan Note (Signed)
Needs to send off cologuard states he has test at home

## 2023-03-23 NOTE — Progress Notes (Signed)
   [name]     MRN: [id number]      DOB: 04/30/1956   HPI [name] is here for follow up and re-evaluation of chronic medical conditions, medication management and review of any available recent lab and radiology data.  Preventive health is updated, specifically  Cancer screening and Immunization.   Does not want lung scan or f/u with pulmonary at this time The PT denies any adverse reactions to current medications since the last visit.  There are no new concerns.  Smokes approx 7 ciggs/ day and less alcohol intake reported ROS Denies recent fever or chills. Denies sinus pressure, nasal congestion, ear pain or sore throat. Denies chest congestion, productive cough or wheezing. Denies chest pains, palpitations and leg swelling Denies abdominal pain, nausea, vomiting,diarrhea or constipation.   Denies dysuria, frequency, hesitancy or incontinence. Denies joint pain, swelling and limitation in mobility. Denies headaches, seizures, numbness, or tingling. Denies depression, anxiety or insomnia. Denies skin break down or rash.   PE  BP 120/70   Pulse 100   Ht 5\' 6"  (1.676 m)   Wt 137 lb (62.1 kg)   SpO2 95%   BMI 22.11 kg/m   Patient alert and oriented and in no cardiopulmonary distress.  HEENT: No facial asymmetry, EOMI,     Neck supple .  Chest: Clear to auscultation bilaterally.Decreased air entry  CVS: S1, S2 no murmurs, no S3.Regular rate.  ABD: Soft non tender.   Ext: No edema  MS: Adequate ROM spine, shoulders, hips and knees.  Skin: Intact, no ulcerations or rash noted.  Psych: Good eye contact, normal affect. Memory intact not anxious or depressed appearing.  CNS: CN 2-12 intact, power,  normal throughout.no focal deficits noted.   Assessment & Plan  Malignant hypertension Controlled, no change in medication DASH diet and commitment to daily physical activity for a minimum of 30 minutes discussed and encouraged, as a part of hypertension  management. The importance of attaining a healthy weight is also discussed.     03/22/2023    4:35 PM 03/22/2023    3:52 PM 10/02/2022    3:59 PM 10/02/2022    3:43 PM 09/15/2022    4:56 PM 09/08/2022   11:20 PM 09/08/2022    8:04 PM  BP/Weight  Systolic BP 120 130 110 129 160 115 135  Diastolic BP 70 87 80 84 98 88 220  Wt. 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) Reports less EtOH intake encouraged to quit and join AA group, no interest at this time, reports drinking this past weekend watching the game and  none since  Cigarette smoker Asked:confirms currently smokes cigarettes Assess: Unwilling to set a quit date, but is trying to cut  back Advise: needs to QUIT to reduce risk of cancer, cardio and cerebrovascular disease Assist: counseled for 5 minutes and literature provided Arrange: follow up in 2 to 4 months Refuses chest scan   Depression with anxiety Resolved and on no medication  MALNUTRITION, MILD Has gained weight, no ;onger malnourished  Centrilobular emphysema (HCC) Does not use inhalers regularly and denies any recent decompensation as far as shotrness of breath or wheezing are concerned  Vitamin D deficiency Updated lab needed at/ before next visit.   Screening for colorectal cancer Needs to send off cologuard states he has test at home

## 2023-03-23 NOTE — Assessment & Plan Note (Signed)
Reports less EtOH intake encouraged to quit and join AA group, no interest at this time, reports drinking this past weekend watching the game and  none since

## 2023-08-12 ENCOUNTER — Ambulatory Visit (INDEPENDENT_AMBULATORY_CARE_PROVIDER_SITE_OTHER): Payer: Medicare Other

## 2023-08-12 VITALS — Ht 66.0 in | Wt 137.0 lb

## 2023-08-12 DIAGNOSIS — Z0000 Encounter for general adult medical examination without abnormal findings: Secondary | ICD-10-CM

## 2023-08-12 NOTE — Progress Notes (Signed)
 Because this visit was a virtual/telehealth visit,  certain criteria was not obtained, such a blood pressure, CBG if patient is a diabetic, and timed get up and go. Any medications not marked as "taking" was not mentioned during the medication reconciliation part of the visit. Any vitals not documented were not able to be obtained due to this being a telehealth visit. Vitals that have been documented are verbally provided by the patient.  Patient was unable to self-report a recent blood pressure reading due to a lack of equipment at home via telehealth.  Subjective:   [name] is a 67 y.o. male who presents for Medicare Annual/Subsequent preventive examination.  Visit Complete: Virtual  I connected with  [name] on 08/12/23 by a audio enabled telemedicine application and verified that I am speaking with the correct person using two identifiers.  Patient Location: Home  Provider Location: Home Office  I discussed the limitations of evaluation and management by telemedicine. The patient expressed understanding and agreed to proceed.  Patient Medicare AWV questionnaire was completed by the patient on n/a; I have confirmed that all information answered by patient is correct and no changes since this date.  Review of Systems     Cardiac Risk Factors include: advanced age (>20men, >61 women);dyslipidemia;hypertension;male gender;obesity (BMI >30kg/m2)     Objective:    Today's Vitals   08/12/23 1521  Weight: 137 lb (62.1 kg)  Height: 5\' 6"  (1.676 m)   Body mass index is 22.11 kg/m.     08/12/2023    3:23 PM 09/08/2022    7:59 PM 07/11/2022    4:22 PM 03/21/2021    9:39 AM 03/21/2021    8:50 AM 03/10/2021    2:55 PM 02/11/2021    7:59 PM  Advanced Directives  Does Patient Have a Medical Advance Directive? No No No No No No No  Would patient like information on creating a medical advance directive? No - Patient declined No - Patient declined No - Patient declined  No -  Patient declined No - Patient declined No - Patient declined    Current Medications (verified) Outpatient Encounter Medications as of 08/12/2023  Medication Sig   albuterol (VENTOLIN HFA) 108 (90 Base) MCG/ACT inhaler Inhale 2 puffs into the lungs every 6 (six) hours as needed for wheezing or shortness of breath.   amLODipine (NORVASC) 10 MG tablet Take 1 tablet (10 mg total) by mouth daily.   diltiazem (CARDIZEM CD) 120 MG 24 hr capsule Take 1 capsule (120 mg total) by mouth daily.   ergocalciferol (VITAMIN D2) 1.25 MG (50000 UT) capsule Take 1 capsule (50,000 Units total) by mouth once a week. One capsule once weekly   ipratropium-albuterol (DUONEB) 0.5-2.5 (3) MG/3ML SOLN INHALE 1 VIAL VIA NEBULIZER EVERY SIX HOURS AS NEEDED FOR WHEEZING/ SHORTNESS OF BREATH   rosuvastatin (CRESTOR) 5 MG tablet Take 1 tablet (5 mg total) by mouth daily.   spironolactone (ALDACTONE) 25 MG tablet Take 1 tablet (25 mg total) by mouth daily.   [DISCONTINUED] fluticasone (FLOVENT DISKUS) 50 MCG/BLIST diskus inhaler Inhale 1 puff into the lungs 2 (two) times daily.   No facility-administered encounter medications on file as of 08/12/2023.    Allergies (verified) Lisinopril   History: Past Medical History:  Diagnosis Date   Alcohol abuse    Allergic rhinitis    Asthma    COPD (chronic obstructive pulmonary disease) (HCC)    Coronary atherosclerosis    Based on chest CT 2012  Essential hypertension    History of neutropenia    Also lymphocytosis - followed by Dr. Zigmund Daniel   Nicotine addiction    Olecranon bursitis, right elbow    PSVT (paroxysmal supraventricular tachycardia)    Document in April 2022   Past Surgical History:  Procedure Laterality Date   COLONOSCOPY     MULTIPLE TOOTH EXTRACTIONS  June 2012   OLECRANON BURSECTOMY Right 11/26/2019   Procedure: Hoy Finlay;  Surgeon: Vickki Hearing, MD;  Location: [facility];  Service: Orthopedics;  Laterality: Right;  pt knows to arrive  at 6:15   Family History  Problem Relation Age of Onset   Diabetes Mother    Hypertension Mother    Diabetes Father    Hypertension Sister    Social History   Socioeconomic History   Marital status: Single    Spouse name: none   Number of children: 0   Years of education: Not on file   Highest education level: Not on file  Occupational History   Occupation: unemployed, retired     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   Tobacco Use   Smoking status: Some Days    Current packs/day: 0.50    Average packs/day: 0.5 packs/day for 38.0 years (19.0 ttl pk-yrs)    Types: Cigarettes   Smokeless tobacco: Never   Tobacco comments:    smokes 6 cigarettes per day 10/18/2020  Vaping Use   Vaping status: Never Used  Substance and Sexual Activity   Alcohol use: Yes    Alcohol/week: 12.0 standard drinks of alcohol    Types: 12 Cans of beer per week    Comment: occasionally a beer   Drug use: No   Sexual activity: Not on file  Other Topics Concern   Not on file  Social History Narrative   [name] is at 37 year old single retired male on disability who lives with his mother and sister. 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[employer])   He does not drive He is independent with his ADLs but needs assist with reading and his iADLs   Social Determinants of Corporate investment banker Strain: Low Risk  (08/12/2023)   Overall Financial Resource Strain (CARDIA)    Difficulty of Paying Living Expenses: Not hard at all  Food Insecurity: No Food Insecurity (08/12/2023)   Hunger Vital Sign    Worried About Running Out of Food in the Last Year: Never true    Ran Out of Food in the Last Year: Never true  Transportation Needs: No Transportation Needs (08/12/2023)   PRAPARE - Administrator, Civil Service (Medical): No    Lack of Transportation (Non-Medical): No  Physical Activity:  Sufficiently Active (08/12/2023)   Exercise Vital Sign    Days of Exercise per Week: 7 days    Minutes of Exercise per Session: 30 min  Stress: No Stress Concern Present (08/12/2023)   [employer] of Occupational Health - Occupational Stress Questionnaire    Feeling of Stress : Not at all  Social Connections: Moderately Integrated (08/12/2023)   Social Connection and Isolation Panel [NHANES]    Frequency of Communication with Friends and Family: More than three times a week    Frequency of Social Gatherings with Friends and Family: More than three times a week    Attends Religious Services: More than 4 times per year  Active Member of Clubs or Organizations: Yes    Attends Banker Meetings: More than 4 times per year    Marital Status: Divorced    Tobacco Counseling Ready to quit: Yes Counseling given: Yes Tobacco comments: smokes 6 cigarettes per day 10/18/2020   Clinical Intake:  Pre-visit preparation completed: Yes  Pain : No/denies pain     BMI - recorded: 22.11 Nutritional Status: BMI of 19-24  Normal Nutritional Risks: None Diabetes: No  How often do you need to have someone help you when you read instructions, pamphlets, or other written materials from your doctor or pharmacy?: 1 - Never  Interpreter Needed?: No  Information entered by ::  Ennis Heavner, CMA   Activities of Daily Living    08/12/2023    3:22 PM  In your present state of health, do you have any difficulty performing the following activities:  Hearing? 0  Vision? 0  Difficulty concentrating or making decisions? 0  Walking or climbing stairs? 0  Dressing or bathing? 0  Doing errands, shopping? 0  Preparing Food and eating ? N  Using the Toilet? N  In the past six months, have you accidently leaked urine? N  Do you have problems with loss of bowel control? N  Managing your Medications? N  Managing your Finances? N  Housekeeping or managing your Housekeeping? N    Patient  Care Team: Kerri Perches, MD as PCP - General Diona Browner Illene Bolus, MD as PCP - Cardiology (Cardiology) Vickki Hearing, MD as Consulting Physician (Orthopedic Surgery) Freddy Finner, NP as Nurse Practitioner (Family Medicine) Marguerita Merles, Reuel Boom, MD as Consulting Physician (Gastroenterology) Marguerita Merles, Reuel Boom, MD as Consulting Physician (Gastroenterology)  Indicate any recent Medical Services you may have received from other than [facility] providers in the past year (date may be approximate).     Assessment:   This is a routine wellness examination for Ludger.  Hearing/Vision screen Hearing Screening - Comments:: Patient denies any hearing difficulties.   Vision Screening - Comments:: Patient states they wear reading glasses only.    Dietary issues and exercise activities discussed:     Goals Addressed             This Visit's Progress    Patient Stated       To remain active and healthy       Depression Screen    08/12/2023    3:24 PM 03/22/2023    3:53 PM 10/02/2022    3:44 PM 07/11/2022    4:23 PM 07/11/2022    4:21 PM 06/22/2022    1:43 PM 03/22/2021    3:47 PM  PHQ 2/9 Scores  PHQ - 2 Score 0 0 0 0 0 0 0    Fall Risk    08/12/2023    3:23 PM 03/22/2023    3:53 PM 10/02/2022    3:44 PM 07/11/2022    4:22 PM 06/22/2022    1:42 PM  Fall Risk   Falls in the past year? 0 0 1 0 0  Number falls in past yr: 0 0 0 0 0  Injury with Fall? 0 0 1 0 0  Risk for fall due to : No Fall Risks No Fall Risks History of fall(s) No Fall Risks No Fall Risks  Follow up Falls prevention discussed Falls evaluation completed Falls evaluation completed Falls evaluation completed Falls evaluation completed    MEDICARE RISK AT HOME: Medicare Risk at Home Any stairs in or around the home?:  Yes If so, are there any without handrails?: No Home free of loose throw rugs in walkways, pet beds, electrical cords, etc?: Yes Adequate lighting in your home to reduce risk of  falls?: Yes Life alert?: No Use of a cane, walker or w/c?: No Grab bars in the bathroom?: No Shower chair or bench in shower?: No Elevated toilet seat or a handicapped toilet?: No  TIMED UP AND GO:  Was the test performed?  No    Cognitive Function:    07/11/2022    4:23 PM  MMSE - Mini Mental State Exam  Not completed: Unable to complete        08/12/2023    3:24 PM 07/11/2022    4:23 PM 07/29/2020    9:09 AM 07/29/2019    9:57 AM  6CIT Screen  What Year? 0 points 0 points 0 points 0 points  What month? 0 points 0 points 0 points 0 points  What time? 0 points 0 points 0 points 0 points  Count back from 20 0 points 0 points 0 points 0 points  Months in reverse 0 points 0 points 0 points 2 points  Repeat phrase 0 points 0 points 10 points 0 points  Total Score 0 points 0 points 10 points 2 points    Immunizations Immunization History  Administered Date(s) Administered   Influenza,inj,Quad PF,6+ Mos 10/15/2013, 09/27/2014, 09/21/2015, 09/23/2018, 08/10/2019, 08/30/2020, 10/02/2022   Influenza-Unspecified 10/08/2017   Moderna Sars-Covid-2 Vaccination 03/29/2020, 04/26/2020, 10/23/2020   Pneumococcal Conjugate-13 06/22/2014   Pneumococcal Polysaccharide-23 12/04/2012   Tdap 05/09/2011, 09/08/2022    TDAP status: Up to date  Flu Vaccine status: Due, Education has been provided regarding the importance of this vaccine. Advised may receive this vaccine at local pharmacy or Health Dept. Aware to provide a copy of the vaccination record if obtained from local pharmacy or Health Dept. Verbalized acceptance and understanding.  Pneumococcal vaccine status: Up to date  Covid-19 vaccine status: Information provided on how to obtain vaccines.   Qualifies for Shingles Vaccine? No   Zostavax completed No   Shingrix Completed?: Yes  Screening Tests Health Maintenance  Topic Date Due   Zoster Vaccines- Shingrix (1 of 2) Never done   Colonoscopy  08/05/2019   COVID-19 Vaccine (4  - 2023-24 season) 08/17/2022   Medicare Annual Wellness (AWV)  07/12/2023   INFLUENZA VACCINE  07/18/2023   DTaP/Tdap/Td (3 - Td or Tdap) 09/08/2032   Hepatitis C Screening  Completed   HIV Screening  Completed   HPV VACCINES  Aged Out    Health Maintenance  Health Maintenance Due  Topic Date Due   Zoster Vaccines- Shingrix (1 of 2) Never done   Colonoscopy  08/05/2019   COVID-19 Vaccine (4 - 2023-24 season) 08/17/2022   Medicare Annual Wellness (AWV)  07/12/2023   INFLUENZA VACCINE  07/18/2023    Colorectal Cancer Screening: Patient refused referral to GI for a colonoscopy     Lung Cancer Screening: (Low Dose CT Chest recommended if Age 77-80 years, 20 pack-year currently smoking OR have quit w/in 15years.) does not qualify.   Lung Cancer Screening Referral: na  Additional Screening:  Hepatitis C Screening: does not qualify; Completed 12/28/2015  Vision Screening: Recommended annual ophthalmology exams for early detection of glaucoma and other disorders of the eye. Is the patient up to date with their annual eye exam?  No  Who is the provider or what is the name of the office in which the patient attends annual eye exams?  N/a If pt is not established with a provider, would they like to be referred to a provider to establish care? No .   Dental Screening: Recommended annual dental exams for proper oral hygiene  Diabetic Foot Exam: n/a  Community Resource Referral / Chronic Care Management: CRR required this visit?  No   CCM required this visit?  No     Plan:     I have personally reviewed and noted the following in the patient's chart:   Medical and social history Use of alcohol, tobacco or illicit drugs  Current medications and supplements including opioid prescriptions. Patient is not currently taking opioid prescriptions. Functional ability and status Nutritional status Physical activity Advanced directives List of other physicians Hospitalizations,  surgeries, and ER visits in previous 12 months Vitals Screenings to include cognitive, depression, and falls Referrals and appointments  In addition, I have reviewed and discussed with patient certain preventive protocols, quality metrics, and best practice recommendations. A written personalized care plan for preventive services as well as general preventive health recommendations were provided to patient.     Jordan Hawks Lean Fayson, CMA   08/12/2023   After Visit Summary: (Mail) Due to this being a telephonic visit, the after visit summary with patients personalized plan was offered to patient via mail

## 2023-08-12 NOTE — Patient Instructions (Signed)
[name] , Thank you for taking time to come for your Medicare Wellness Visit. I appreciate your ongoing commitment to your health goals. Please review the following plan we discussed and let me know if I can assist you in the future.   Referrals/Orders/Follow-Ups/Clinician Recommendations:    This is a list of the screening recommended for you and due dates:  Health Maintenance  Topic Date Due   Zoster (Shingles) Vaccine (1 of 2) Never done   Colon Cancer Screening  08/05/2019   COVID-19 Vaccine (4 - 2023-24 season) 08/17/2022   Medicare Annual Wellness Visit  07/12/2023   Flu Shot  07/18/2023   DTaP/Tdap/Td vaccine (3 - Td or Tdap) 09/08/2032   Hepatitis C Screening  Completed   HIV Screening  Completed   HPV Vaccine  Aged Out    Advanced directives: (Declined) Advance directive discussed with you today. Even though you declined this today, please call our office should you change your mind, and we can give you the proper paperwork for you to fill out.  Next Medicare Annual Wellness Visit scheduled for next year: [facility] 32 Years and Older, Male Preventive care refers to lifestyle choices and visits with your health care provider that can promote health and wellness. Preventive care visits are also called wellness exams. What can I expect for my preventive care visit? Counseling During your preventive care visit, your health care provider may ask about your: Medical history, including: Past medical problems. Family medical history. History of falls. Current health, including: Emotional well-being. Home life and relationship well-being. Sexual activity. Memory and ability to understand (cognition). Lifestyle, including: Alcohol, nicotine or tobacco, and drug use. Access to firearms. Diet, exercise, and sleep habits. Work and work Astronomer. Sunscreen use. Safety issues such as seatbelt and bike helmet use. Physical exam Your health care provider will check  your: Height and weight. These may be used to calculate your BMI (body mass index). BMI is a measurement that tells if you are at a healthy weight. Waist circumference. This measures the distance around your waistline. This measurement also tells if you are at a healthy weight and may help predict your risk of certain diseases, such as type 2 diabetes and high blood pressure. Heart rate and blood pressure. Body temperature. Skin for abnormal spots. What immunizations do I need?  Vaccines are usually given at various ages, according to a schedule. Your health care provider will recommend vaccines for you based on your age, medical history, and lifestyle or other factors, such as travel or where you work. What tests do I need? Screening Your health care provider may recommend screening tests for certain conditions. This may include: Lipid and cholesterol levels. Diabetes screening. This is done by checking your blood sugar (glucose) after you have not eaten for a while (fasting). Hepatitis C test. Hepatitis B test. HIV (human immunodeficiency virus) test. STI (sexually transmitted infection) testing, if you are at risk. Lung cancer screening. Colorectal cancer screening. Prostate cancer screening. Abdominal aortic aneurysm (AAA) screening. You may need this if you are a current or former smoker. Talk with your health care provider about your test results, treatment options, and if necessary, the need for more tests. Follow these instructions at home: Eating and drinking  Eat a diet that includes fresh fruits and vegetables, whole grains, lean protein, and low-fat dairy products. Limit your intake of foods with high amounts of sugar, saturated fats, and salt. Take vitamin and mineral supplements as recommended by your health  care provider. Do not drink alcohol if your health care provider tells you not to drink. If you drink alcohol: Limit how much you have to 0-2 drinks a day. Know how  much alcohol is in your drink. In the [place] one drink equals one 12 oz bottle of beer (355 mL), one 5 oz glass of wine (148 mL), or one 1 oz glass of hard liquor (44 mL). Lifestyle Brush your teeth every morning and night with fluoride toothpaste. Floss one time each day. Exercise for at least 30 minutes 5 or more days each week. Do not use any products that contain nicotine or tobacco. These products include cigarettes, chewing tobacco, and vaping devices, such as e-cigarettes. If you need help quitting, ask your health care provider. Do not use drugs. If you are sexually active, practice safe sex. Use a condom or other form of protection to prevent STIs. Take aspirin only as told by your health care provider. Make sure that you understand how much to take and what form to take. Work with your health care provider to find out whether it is safe and beneficial for you to take aspirin daily. Ask your health care provider if you need to take a cholesterol-lowering medicine (statin). Find healthy ways to manage stress, such as: Meditation, yoga, or listening to music. Journaling. Talking to a trusted person. Spending time with friends and family. Safety Always wear your seat belt while driving or riding in a vehicle. Do not drive: If you have been drinking alcohol. Do not ride with someone who has been drinking. When you are tired or distracted. While texting. If you have been using any mind-altering substances or drugs. Wear a helmet and other protective equipment during sports activities. If you have firearms in your house, make sure you follow all gun safety procedures. Minimize exposure to UV radiation to reduce your risk of skin cancer. What's next? Visit your health care provider once a year for an annual wellness visit. Ask your health care provider how often you should have your eyes and teeth checked. Stay up to date on all vaccines. This information is not intended to replace  advice given to you by your health care provider. Make sure you discuss any questions you have with your health care provider. Document Revised: 05/31/2021 Document Reviewed: 05/31/2021 Elsevier Patient Education  2024 [employer]. Understanding Your Risk for Falls Millions of people have serious injuries from falls each year. It is important to understand your risk of falling. Talk with your health care provider about your risk and what you can do to lower it. If you do have a serious fall, make sure to tell your provider. Falling once raises your risk of falling again. How can falls affect me? Serious injuries from falls are common. These include: Broken bones, such as hip fractures. Head injuries, such as traumatic brain injuries (TBI) or concussions. A fear of falling can cause you to avoid activities and stay at home. This can make your muscles weaker and raise your risk for a fall. What can increase my risk? There are a number of risk factors that increase your risk for falling. The more risk factors you have, the higher your risk of falling. Serious injuries from a fall happen most often to people who are older than 65 years old. Teenagers and young adults ages 73-29 are also at higher risk. Common risk factors include: Weakness in the lower body. Being generally weak or confused due to long-term (chronic) illness.  Dizziness or balance problems. Poor vision. Medicines that cause dizziness or drowsiness. These may include: Medicines for your blood pressure, heart, anxiety, insomnia, or swelling (edema). Pain medicines. Muscle relaxants. Other risk factors include: Drinking alcohol. Having had a fall in the past. Having foot pain or wearing improper footwear. Working at a dangerous job. Having any of the following in your home: Tripping hazards, such as floor clutter or loose rugs. Poor lighting. Pets. Having dementia or memory loss. What actions can I take to lower my risk of  falling?     Physical activity Stay physically fit. Do strength and balance exercises. Consider taking a regular class to build strength and balance. Yoga and tai chi are good options. Vision Have your eyes checked every year and your prescription for glasses or contacts updated as needed. Shoes and walking aids Wear non-skid shoes. Wear shoes that have rubber soles and low heels. Do not wear high heels. Do not walk around the house in socks or slippers. Use a cane or walker as told by your provider. Home safety Attach secure railings on both sides of your stairs. Install grab bars for your bathtub, shower, and toilet. Use a non-skid mat in your bathtub or shower. Attach bath mats securely with double-sided, non-slip rug tape. Use good lighting in all rooms. Keep a flashlight near your bed. Make sure there is a clear path from your bed to the bathroom. Use night-lights. Do not use throw rugs. Make sure all carpeting is taped or tacked down securely. Remove all clutter from walkways and stairways, including extension cords. Repair uneven or broken steps and floors. Avoid walking on icy or slippery surfaces. Walk on the grass instead of on icy or slick sidewalks. Use ice melter to get rid of ice on walkways in the winter. Use a cordless phone. Questions to ask your health care provider Can you help me check my risk for a fall? Do any of my medicines make me more likely to fall? Should I take a vitamin D supplement? What exercises can I do to improve my strength and balance? Should I make an appointment to have my vision checked? Do I need a bone density test to check for weak bones (osteoporosis)? Would it help to use a cane or a walker? Where to find more information Centers for Disease Control and Prevention, STEADI: TonerPromos.no Community-Based Fall Prevention Programs: TonerPromos.no [employer] on Aging: BaseRingTones.pl Contact a health care provider if: You fall at home. You are  afraid of falling at home. You feel weak, drowsy, or dizzy. This information is not intended to replace advice given to you by your health care provider. Make sure you discuss any questions you have with your health care provider. Document Revised: 08/06/2022 Document Reviewed: 08/06/2022 Elsevier Patient Education  2024 [employer].

## 2023-09-12 ENCOUNTER — Other Ambulatory Visit: Payer: Self-pay | Admitting: Family Medicine

## 2023-09-20 ENCOUNTER — Other Ambulatory Visit: Payer: Self-pay | Admitting: Family Medicine

## 2023-09-20 DIAGNOSIS — Z1212 Encounter for screening for malignant neoplasm of rectum: Secondary | ICD-10-CM

## 2023-09-20 DIAGNOSIS — Z1211 Encounter for screening for malignant neoplasm of colon: Secondary | ICD-10-CM

## 2023-09-24 ENCOUNTER — Ambulatory Visit: Payer: Medicare Other | Admitting: Family Medicine

## 2023-10-01 ENCOUNTER — Encounter: Payer: Self-pay | Admitting: Family Medicine

## 2023-11-01 ENCOUNTER — Ambulatory Visit: Payer: Medicare Other | Admitting: Family Medicine

## 2023-11-10 NOTE — Progress Notes (Unsigned)
   Established Patient Office Visit   Subjective  Patient ID: JOSHUALEE ESTRELA, male    DOB: 11-07-1956  Age: 67 y.o. MRN: [id number]  No chief complaint on file.   He  has a past medical history of Alcohol abuse, Allergic rhinitis, Asthma, COPD (chronic obstructive pulmonary disease) (HCC), Coronary atherosclerosis, Essential hypertension, History of neutropenia, Nicotine addiction, Olecranon bursitis, right elbow, and PSVT (paroxysmal supraventricular tachycardia).  HPI  ROS    Objective:     There were no vitals taken for this visit. {Vitals History (Optional):23777}  Physical Exam   No results found for any visits on 11/11/23.  The ASCVD Risk score (Arnett DK, et al., 2019) failed to calculate for the following reasons:   The systolic blood pressure is missing   The valid HDL cholesterol range is 20 to 100 mg/dL    Assessment & Plan:  There are no diagnoses linked to this encounter.  No follow-ups on file.   Cruzita Lederer Newman Nip, FNP

## 2023-11-10 NOTE — Patient Instructions (Signed)

## 2023-11-11 ENCOUNTER — Ambulatory Visit: Payer: Medicare Other | Admitting: Family Medicine

## 2023-11-11 DIAGNOSIS — E038 Other specified hypothyroidism: Secondary | ICD-10-CM

## 2023-11-11 DIAGNOSIS — R7301 Impaired fasting glucose: Secondary | ICD-10-CM

## 2023-11-11 DIAGNOSIS — Z1211 Encounter for screening for malignant neoplasm of colon: Secondary | ICD-10-CM

## 2023-11-11 DIAGNOSIS — I10 Essential (primary) hypertension: Secondary | ICD-10-CM

## 2023-11-12 ENCOUNTER — Ambulatory Visit: Payer: Self-pay | Admitting: Internal Medicine

## 2023-11-21 NOTE — Patient Instructions (Signed)

## 2023-11-21 NOTE — Progress Notes (Signed)
   Established Patient Office Visit   Subjective  Patient ID: [name], male    DOB: [birth year]  Age: 67 y.o. MRN: [id number]  No chief complaint on file.   He  has a past medical history of Alcohol abuse, Allergic rhinitis, Asthma, COPD (chronic obstructive pulmonary disease) (HCC), Coronary atherosclerosis, Essential hypertension, History of neutropenia, Nicotine addiction, Olecranon bursitis, right elbow, and PSVT (paroxysmal supraventricular tachycardia).  HPI  ROS    Objective:     There were no vitals taken for this visit.   Physical Exam   No results found for any visits on 11/22/23.  The ASCVD Risk score (Arnett DK, et al., 2019) failed to calculate for the following reasons:   The systolic blood pressure is missing   The valid HDL cholesterol range is 20 to 100 mg/dL    Assessment & Plan:  There are no diagnoses linked to this encounter.  No follow-ups on file.   Cruzita Lederer Newman Nip, FNP

## 2023-11-22 ENCOUNTER — Encounter: Payer: Self-pay | Admitting: Family Medicine

## 2023-11-22 DIAGNOSIS — E038 Other specified hypothyroidism: Secondary | ICD-10-CM

## 2023-11-22 DIAGNOSIS — Z1211 Encounter for screening for malignant neoplasm of colon: Secondary | ICD-10-CM

## 2023-11-22 DIAGNOSIS — E538 Deficiency of other specified B group vitamins: Secondary | ICD-10-CM

## 2023-11-22 DIAGNOSIS — I10 Essential (primary) hypertension: Secondary | ICD-10-CM

## 2023-11-22 DIAGNOSIS — E559 Vitamin D deficiency, unspecified: Secondary | ICD-10-CM

## 2023-11-22 DIAGNOSIS — R7301 Impaired fasting glucose: Secondary | ICD-10-CM

## 2023-11-22 NOTE — Progress Notes (Signed)
No show

## 2024-01-14 ENCOUNTER — Other Ambulatory Visit: Payer: Self-pay | Admitting: Family Medicine

## 2024-01-14 DIAGNOSIS — J432 Centrilobular emphysema: Secondary | ICD-10-CM

## 2024-01-14 MED ORDER — ALBUTEROL SULFATE HFA 108 (90 BASE) MCG/ACT IN AERS: 2.0000 | INHALATION_SPRAY | Freq: Four times a day (QID) | RESPIRATORY_TRACT | 2 refills | Status: AC | PRN

## 2024-01-14 MED ORDER — ROSUVASTATIN CALCIUM 5 MG PO TABS: 5.0000 mg | ORAL_TABLET | Freq: Every day | ORAL | 0 refills | Status: DC

## 2024-01-14 MED ORDER — AMLODIPINE BESYLATE 10 MG PO TABS: 10.0000 mg | ORAL_TABLET | Freq: Every day | ORAL | 6 refills | Status: DC

## 2024-01-14 MED ORDER — DILTIAZEM HCL ER COATED BEADS 120 MG PO CP24: 120.0000 mg | ORAL_CAPSULE | Freq: Every day | ORAL | 6 refills | Status: DC

## 2024-01-14 NOTE — Telephone Encounter (Signed)
Copied from CRM [id number]. Topic: Clinical - Medication Refill >> Jan 14, 2024  3:58 PM Carlatta H wrote: Most Recent Primary Care Visit:  Provider: Recardo Evangelist A  Department: [facility]  Visit Type: MEDICARE AWV, SEQUENTIAL  Date: 08/12/2023  Medication:  amLODipine (NORVASC) 10 MG tablet [[id number]] diltiazem (CARDIZEM CD) 120 MG 24 hr capsule [[id number]] rosuvastatin (CRESTOR) 5 MG tablet [[id number]] spironolactone (ALDACTONE) 25 MG tablet [[id number]]  Has the patient contacted their pharmacy? Yes (Agent: If no, request that the patient contact the pharmacy for the refill. If patient does not wish to contact the pharmacy document the reason why and proceed with request.) (Agent: If yes, when and what did the pharmacy advise?) Advised patient to call doctors office  Is this the correct pharmacy for this prescription? Yes If no, delete pharmacy and type the correct one.  This is the patient's preferred pharmacy:  [facility] [place], [place] - [address] [place] [address] Phone: [phone] Fax: [phone]   Has the prescription been filled recently? No  Is the patient out of the medication? Yes  Has the patient been seen for an appointment in the last year OR does the patient have an upcoming appointment? No  Can we respond through MyChart? No  Agent: Please be advised that Rx refills may take up to 3 business days. We ask that you follow-up with your pharmacy.

## 2024-02-18 ENCOUNTER — Other Ambulatory Visit: Payer: Self-pay | Admitting: Family Medicine

## 2024-02-18 ENCOUNTER — Encounter: Payer: Self-pay | Admitting: Family Medicine

## 2024-02-18 ENCOUNTER — Ambulatory Visit: Payer: Self-pay | Admitting: Family Medicine

## 2024-02-18 ENCOUNTER — Telehealth: Admitting: Family Medicine

## 2024-02-18 DIAGNOSIS — I10 Essential (primary) hypertension: Secondary | ICD-10-CM | POA: Diagnosis not present

## 2024-02-18 DIAGNOSIS — E559 Vitamin D deficiency, unspecified: Secondary | ICD-10-CM

## 2024-02-18 DIAGNOSIS — E785 Hyperlipidemia, unspecified: Secondary | ICD-10-CM

## 2024-02-18 DIAGNOSIS — F1020 Alcohol dependence, uncomplicated: Secondary | ICD-10-CM

## 2024-02-18 DIAGNOSIS — F17210 Nicotine dependence, cigarettes, uncomplicated: Secondary | ICD-10-CM

## 2024-02-18 DIAGNOSIS — F418 Other specified anxiety disorders: Secondary | ICD-10-CM | POA: Diagnosis not present

## 2024-02-18 DIAGNOSIS — F1029 Alcohol dependence with unspecified alcohol-induced disorder: Secondary | ICD-10-CM

## 2024-02-18 DIAGNOSIS — F10259 Alcohol dependence with alcohol-induced psychotic disorder, unspecified: Secondary | ICD-10-CM

## 2024-02-18 MED ORDER — CITALOPRAM HYDROBROMIDE 10 MG PO TABS: 10.0000 mg | ORAL_TABLET | Freq: Every day | ORAL | 3 refills | Status: DC

## 2024-02-18 MED ORDER — AMLODIPINE BESYLATE 10 MG PO TABS: 10.0000 mg | ORAL_TABLET | Freq: Every day | ORAL | 2 refills | Status: DC

## 2024-02-18 MED ORDER — DILTIAZEM HCL ER COATED BEADS 120 MG PO CP24: 120.0000 mg | ORAL_CAPSULE | Freq: Every day | ORAL | 2 refills | Status: DC

## 2024-02-18 NOTE — Assessment & Plan Note (Signed)
 Hyperlipidemia:Low fat diet discussed and encouraged. Updated lab needed at/ before next visit.    Lipid Panel  Lab Results  Component Value Date   CHOL 173 04/27/2022   HDL 104 04/27/2022   LDLCALC 53 04/27/2022   TRIG 91 04/27/2022   CHOLHDL 1.7 04/27/2022

## 2024-02-18 NOTE — Assessment & Plan Note (Signed)
 Blood pressure noted to be controlled when compliant and extremely high when not, meds refilled and needs to stay off of alcohol also

## 2024-02-18 NOTE — Patient Instructions (Addendum)
 F/U in office in next 4 to 8 weeks, call if you need to be seen before  VERY important that fasting labs be obtained this week, there are 2 [facility] offices you can go to [employer] is open at 7 am , there is also one on [address] which opens at [employer] have had NO labs, though they have been ordered on 2 separate occasions since May 2023  On record review your blood pressure has been as high as 180 and 200, with very high lower numbers as well. When I last saw you in the office in 03/2023, almost 1 year ago, your blood pressure was in a normal range BECAUSE you were taking your medications, so it is vital you take the amlodipine and diltiazem as prescribed  I will also prescribe citalopram 10 mg daily which you have used in the past for anxiety , and depression  I need updated lab work to decide on the rosuvastatin  A nurse will contact you with available outpatient options for treatment of alcohol addiction

## 2024-02-18 NOTE — Assessment & Plan Note (Signed)
 Current smoker will address smoking cessation/ reduction at next visit, an inc risk of stroke along with his HTN

## 2024-02-18 NOTE — Assessment & Plan Note (Signed)
 Currently grieving loss of mother approx 4 weeks ago, resume citalopram, no interest in therapy currently

## 2024-02-18 NOTE — Assessment & Plan Note (Signed)
 Updated lab needed at/ before next visit.

## 2024-02-18 NOTE — Assessment & Plan Note (Signed)
 Needs addiction treatment, states he is willing to do out patient treatment , Nurse to provide info on facilities in the area which provide this

## 2024-02-18 NOTE — Progress Notes (Signed)
 Virtual Visit via Video Note  I connected with [name] on 02/18/24 at  2:40 PM EST by a video enabled telemedicine application and verified that I am speaking with the correct person using two identifiers.  Location: Patient: home Provider: office   I discussed the limitations of evaluation and management by telemedicine and the availability of in person appointments. The patient expressed understanding and agreed to proceed.  History of Present Illness: Visit conducted with his nephew Dr Delos Haring, completing Residency in Psych at [facility] currently , visiting as patient's Mother, passed Feb 7 , 2025. Nephew has questions/ concerns re meds that his Kateri Mc is taking inconsistently, amlodipine, diltiazem and rosuvastatin Pt is grieving, no interest in therapy, is willing to resume medication he has used in the past for anxiety Nephew mentioned naltrexone , dosing etc, however I advised that I do not generally prescribe and mange addiction treatment and will make every effort to find a local resource    Observations/Objective: There were no vitals taken for this visit. Good communication ,  Alert and oriented x 3 No signs of respiratory distress during speech Fine tremor reported by Nephew Flat affect   Assessment and Plan: Depression with anxiety Currently grieving loss of mother approx 4 weeks ago, resume citalopram, no interest in therapy currently  Malignant hypertension Blood pressure noted to be controlled when compliant and extremely high when not, meds refilled and needs to stay off of alcohol also  Alcohol addiction (HCC) Needs addiction treatment, states he is willing to do out patient treatment , Nurse to provide info on facilities in the area which provide this  Cigarette smoker Current smoker will address smoking cessation/ reduction at next visit, an inc risk of stroke along with his HTN  Vitamin D deficiency Updated lab needed at/ before next  visit.   Hyperlipidemia LDL goal <100 Hyperlipidemia:Low fat diet discussed and encouraged. Updated lab needed at/ before next visit.    Lipid Panel  Lab Results  Component Value Date   CHOL 173 04/27/2022   HDL 104 04/27/2022   LDLCALC 53 04/27/2022   TRIG 91 04/27/2022   CHOLHDL 1.7 04/27/2022        Follow Up Instructions:    I discussed the assessment and treatment plan with the patient. The patient was provided an opportunity to ask questions and all were answered. The patient agreed with the plan and demonstrated an understanding of the instructions.   The patient was advised to call back or seek an in-person evaluation if the symptoms worsen or if the condition fails to improve as anticipated.  I provided 24 minutes of non-face-to-face time during this encounter.   [name], MD

## 2024-02-19 ENCOUNTER — Telehealth: Payer: Self-pay | Admitting: *Deleted

## 2024-02-19 ENCOUNTER — Ambulatory Visit: Payer: Self-pay | Admitting: *Deleted

## 2024-02-19 ENCOUNTER — Encounter: Payer: Self-pay | Admitting: *Deleted

## 2024-02-19 NOTE — Progress Notes (Signed)
 Complex Care Management Note Care Guide Note  02/19/2024 Name: KATAI MARSICO MRN: [id number] DOB: 1956-07-10   Complex Care Management Outreach Attempts: An unsuccessful telephone outreach was attempted today to offer the patient information about available complex care management services.  Follow Up Plan:  Additional outreach attempts will be made to offer the patient complex care management information and services.   Encounter Outcome:  No Answer  Gwenevere Ghazi  [facility]  [facility] Guide  Direct Dial: [phone]  Fax [phone]

## 2024-02-19 NOTE — Patient Instructions (Signed)
 Visit Information  Thank you for taking time to visit with me today. Please don't hesitate to contact me if I can be of assistance to you.   Following are the goals we discussed today:   Goals Addressed               This Visit's Progress     Receive Assistance Obtaining Alcohol & Drug Treatment Programs & Services. (pt-stated)   On track     Care Coordination Interventions:  Interventions Today    Flowsheet Row Most Recent Value  Chronic Disease   Chronic disease during today's visit Hypertension (HTN), Chronic Obstructive Pulmonary Disease (COPD), Other  General Interventions   General Interventions Discussed/Reviewed General Interventions Discussed, Labs, Vaccines, Doctor Visits, Health Screening, Annual Foot Exam, General Interventions Reviewed, Lipid Profile, Sick Day Rules, Communication with, [employer], Horticulturist, commercial (DME), Annual Eye Exam, Level of Care  Labs Hgb A1c every 3 months, Kidney Function, Hgb A1c annually  Vaccines COVID-19, Flu, Pneumonia, RSV, Shingles, Tetanus/Pertussis/Diphtheria  Doctor Visits Discussed/Reviewed Doctor Visits Discussed, Doctor Visits Reviewed, Annual Wellness Visits, PCP, Specialist  Health Screening Bone Density, Colonoscopy, Prostate  PCP/Specialist Visits Compliance with follow-up visit  Communication with PCP/Specialists, RN, Pharmacists, Social Work  Level of Care Adult Daycare, Air traffic controller, Assisted Living, Skilled Nursing Advertising copywriter, Personal Care Services  Exercise Interventions   Exercise Discussed/Reviewed Exercise Discussed, Assistive device use and maintanence, Exercise Reviewed, Physical Activity, Weight Managment  Physical Activity Discussed/Reviewed Physical Activity Discussed, Home Exercise Program (HEP), PREP, Physical Activity Reviewed, Gym, Types of exercise  Weight Management Weight maintenance  Education Interventions   Education Provided Provided Therapist, sports, Provided  Web-based Education, Provided Education  Provided Verbal Education On Nutrition, Mental Health/Coping with Illness, When to see the doctor, Foot Care, Eye Care, Labs, Blood Sugar Monitoring, Applications, Exercise, Medication, [employer], [employer]  Labs Reviewed Hgb A1c  Applications Medicaid, Personal Care Services  Mental Health Interventions   Mental Health Discussed/Reviewed Mental Health Discussed, Anxiety, Crisis, Suicide, Other, Coping Strategies, Substance Abuse, Mental Health Reviewed, Grief and Loss, Depression  Nutrition Interventions   Nutrition Discussed/Reviewed Nutrition Discussed, Adding fruits and vegetables, Increasing proteins, Decreasing fats, Nutrition Reviewed, Fluid intake, Carbohydrate meal planning, Portion sizes, Decreasing salt, Supplemental nutrition, Decreasing sugar intake  Pharmacy Interventions   Pharmacy Dicussed/Reviewed Pharmacy Topics Discussed, Medications and their functions, Medication Adherence, Pharmacy Topics Reviewed, Affording Medications  Safety Interventions   Safety Discussed/Reviewed Safety Discussed, Safety Reviewed, Fall Risk, Home Safety  Home Safety Assistive Devices, Need for home safety assessment, Refer for community resources  Advanced Directive Interventions   Advanced Directives Discussed/Reviewed Advanced Directives Discussed, Advanced Directives Reviewed      Assessed Social Determinant of Health Barriers. Discussed Plans for Ongoing Care Management Follow Up. Provided Careers information officer Information for Care Management Team Members. Screened for Signs & Symptoms of Depression, Related to Chronic Disease State.  PHQ2 & PHQ9 Depression Screen Completed & Results Reviewed.  Suicidal Ideation & Homicidal Ideation Assessed - None Present.   Domestic Violence Assessed - None Present. Access to Weapons Assessed - None Present.   Active Listening & Reflection Utilized.  Verbalization of Feelings Encouraged.  Emotional Support  Provided. Alcohol & Drug Abuse Acknowledged. Alcohol & Drug Abuse Treatment Programs Reviewed. Alcohol & Drug Abuse Support Groups Mailed. Self-Enrollment in Alcohol & Drug Abuse Treatment Program of Interest Emphasized, from List Provided. Crisis Support Information, Agencies, Services, & Resources Discussed. Problem Solving Interventions Identified. Task-Centered Solutions Implemented.   Solution-Focused Strategies Developed.  Acceptance & Commitment Therapy Introduced. Brief Cognitive Behavioral Therapy Initiated. Client-Centered Therapy Enacted. Reviewed Prescription Medications & Discussed Importance of Compliance. Quality of Sleep Assessed & Sleep Hygiene Techniques Promoted. Encouraged Self-Enrollment with Psychiatrist of Interest to Receive Psychotropic Medication Administration & Management, in An Effort to Reduce & Manage Symptoms of Anxiety & Depression, from List Provided. Encouraged Self-Enrollment with Therapist of Interest to Receive Psychotherapeutic Counseling & Supportive Services, in An Effort to Reduce & Manage Symptoms of Anxiety & Depression, from List Provided. Encouraged Review of Alcohol & Drug Treatment Programs, Agencies, Nurse, adult, Resources, [place] in [place], [place], [place], [place], [address], Warden/ranger to [place], [employer] ([email or web address]) on 02/19/2024.  Encouraged Routine Engagement with Danford Bad, Licensed Clinical Social Worker with [facility], [facility] [phone]), if You Have Questions, Need Assistance, or If Additional Social Work Needs Are Identified Between Now & Our Next Follow-Up Outreach Call, Scheduled on 02/21/2024 at 3:15 PM.      Our next appointment is by telephone on 02/21/2024 at 3:15 pm.  Please call the care guide team at [phone] if you need to cancel or reschedule your appointment.   If you are experiencing a Mental Health or Behavioral Health Crisis or need someone to  talk to, please call the Suicide and Crisis Lifeline: 988 call the [place] National Suicide Prevention Lifeline: 534-128-3674 or TTY: 682-294-7457 TTY (815) 467-2316) to talk to a trained counselor call 1-800-273-TALK (toll free, 24 hour hotline) go to [facility] [address], [place] [phone]) call the [facility] Crisis Line: [phone] call 911  Patient verbalizes understanding of instructions and care plan provided today and agrees to view in MyChart. Active MyChart status and patient understanding of how to access instructions and care plan via MyChart confirmed with patient.     Telephone follow up appointment with care management team member scheduled for:  02/21/2024 at 3:15 pm.   Danford Bad, BSW, MSW, LCSW   [facility], [facility] Clinical Social Worker II Direct Dial: [phone]  Fax: [phone] Website: [email or web address]

## 2024-02-19 NOTE — Patient Outreach (Signed)
 Care Coordination   Initial Visit Note   02/19/2024  Name: [name] MRN: [id number] DOB: 1955/12/27  CHASTIN RIESGO is a 68 y.o. year old male who sees Lodema Hong, Milus Mallick, MD for primary care. I spoke with [name] and nephew, Savan Ruta by phone today.  What matters to the patients health and wellness today?  Receive Assistance Obtaining Alcohol & Drug Treatment Programs & Services.    Goals Addressed               This Visit's Progress     Receive Assistance Obtaining Alcohol & Drug Treatment Programs & Services. (pt-stated)   On track     Care Coordination Interventions:  Interventions Today    Flowsheet Row Most Recent Value  Chronic Disease   Chronic disease during today's visit Hypertension (HTN), Chronic Obstructive Pulmonary Disease (COPD), Other  General Interventions   General Interventions Discussed/Reviewed General Interventions Discussed, Labs, Vaccines, Doctor Visits, Health Screening, Annual Foot Exam, General Interventions Reviewed, Lipid Profile, Sick Day Rules, Communication with, [employer], Horticulturist, commercial (DME), Annual Eye Exam, Level of Care  Labs Hgb A1c every 3 months, Kidney Function, Hgb A1c annually  Vaccines COVID-19, Flu, Pneumonia, RSV, Shingles, Tetanus/Pertussis/Diphtheria  Doctor Visits Discussed/Reviewed Doctor Visits Discussed, Doctor Visits Reviewed, Annual Wellness Visits, PCP, Specialist  Health Screening Bone Density, Colonoscopy, Prostate  PCP/Specialist Visits Compliance with follow-up visit  Communication with PCP/Specialists, RN, Pharmacists, Social Work  Level of Care Adult Daycare, Air traffic controller, Assisted Living, Skilled Nursing Advertising copywriter, Personal Care Services  Exercise Interventions   Exercise Discussed/Reviewed Exercise Discussed, Assistive device use and maintanence, Exercise Reviewed, Physical Activity, Weight Managment  Physical Activity Discussed/Reviewed Physical  Activity Discussed, Home Exercise Program (HEP), PREP, Physical Activity Reviewed, Gym, Types of exercise  Weight Management Weight maintenance  Education Interventions   Education Provided Provided Therapist, sports, Provided Web-based Education, Provided Education  Provided Verbal Education On Nutrition, Mental Health/Coping with Illness, When to see the doctor, Foot Care, Eye Care, Labs, Blood Sugar Monitoring, Applications, Exercise, Medication, [employer], [employer]  Labs Reviewed Hgb A1c  Applications Medicaid, Personal Care Services  Mental Health Interventions   Mental Health Discussed/Reviewed Mental Health Discussed, Anxiety, Crisis, Suicide, Other, Coping Strategies, Substance Abuse, Mental Health Reviewed, Grief and Loss, Depression  Nutrition Interventions   Nutrition Discussed/Reviewed Nutrition Discussed, Adding fruits and vegetables, Increasing proteins, Decreasing fats, Nutrition Reviewed, Fluid intake, Carbohydrate meal planning, Portion sizes, Decreasing salt, Supplemental nutrition, Decreasing sugar intake  Pharmacy Interventions   Pharmacy Dicussed/Reviewed Pharmacy Topics Discussed, Medications and their functions, Medication Adherence, Pharmacy Topics Reviewed, Affording Medications  Safety Interventions   Safety Discussed/Reviewed Safety Discussed, Safety Reviewed, Fall Risk, Home Safety  Home Safety Assistive Devices, Need for home safety assessment, Refer for community resources  Advanced Directive Interventions   Advanced Directives Discussed/Reviewed Advanced Directives Discussed, Advanced Directives Reviewed      Assessed Social Determinant of Health Barriers. Discussed Plans for Ongoing Care Management Follow Up. Provided Careers information officer Information for Care Management Team Members. Screened for Signs & Symptoms of Depression, Related to Chronic Disease State.  PHQ2 & PHQ9 Depression Screen Completed & Results Reviewed.  Suicidal Ideation &  Homicidal Ideation Assessed - None Present.   Domestic Violence Assessed - None Present. Access to Weapons Assessed - None Present.   Active Listening & Reflection Utilized.  Verbalization of Feelings Encouraged.  Emotional Support Provided. Alcohol & Drug Abuse Acknowledged. Alcohol & Drug Abuse Treatment Programs Reviewed. Alcohol & Drug  Abuse Support Groups Mailed. Self-Enrollment in Alcohol & Drug Abuse Treatment Program of Interest Emphasized, from List Provided. Crisis Support Information, Agencies, Services, & Resources Discussed. Problem Solving Interventions Identified. Task-Centered Solutions Implemented.   Solution-Focused Strategies Developed. Acceptance & Commitment Therapy Introduced. Brief Cognitive Behavioral Therapy Initiated. Client-Centered Therapy Enacted. Reviewed Prescription Medications & Discussed Importance of Compliance. Quality of Sleep Assessed & Sleep Hygiene Techniques Promoted. Encouraged Self-Enrollment with Psychiatrist of Interest to Receive Psychotropic Medication Administration & Management, in An Effort to Reduce & Manage Symptoms of Anxiety & Depression, from List Provided. Encouraged Self-Enrollment with Therapist of Interest to Receive Psychotherapeutic Counseling & Supportive Services, in An Effort to Reduce & Manage Symptoms of Anxiety & Depression, from List Provided. Encouraged Review of Alcohol & Drug Treatment Programs, Agencies, Nurse, adult, Resources, [place]., in [place], [place], [place], [place], [place], Warden/ranger to [place], [employer] ([email or web address]) on 02/19/2024.  Encouraged Routine Engagement with Danford Bad, Licensed Clinical Social Worker with [facility], [facility] [phone]), if You Have Questions, Need Assistance, or If Additional Social Work Needs Are Identified Between Now & Our Next Follow-Up Outreach Call, Scheduled on 02/21/2024 at 3:15 PM.        SDOH assessments  and interventions completed:  Yes.  SDOH Interventions Today    Flowsheet Row Most Recent Value  SDOH Interventions   Food Insecurity Interventions Intervention Not Indicated  Housing Interventions Intervention Not Indicated  Transportation Interventions Intervention Not Indicated, Community Resources Provided, Patient Resources (Friends/Family), Payor Benefit  Utilities Interventions Intervention Not Indicated  Alcohol Usage Interventions Community Resources Provided, Alcohol Education/Brief Advice  Financial Strain Interventions Intervention Not Indicated  Physical Activity Interventions Patient Declined  Stress Interventions Intervention Not Indicated  Social Connections Interventions Intervention Not Indicated  Health Literacy Interventions Intervention Not Indicated     Care Coordination Interventions:  Yes, provided.   Follow up plan: Follow up call scheduled for 02/21/2024 at 3:15 pm.  Encounter Outcome:  Patient Visit Completed.   Danford Bad, BSW, MSW, LCSW [facility], [facility] Clinical Social Worker II Direct Dial: [phone]  Fax: [phone] Website: [email or web address]

## 2024-02-21 ENCOUNTER — Ambulatory Visit: Payer: Self-pay | Admitting: *Deleted

## 2024-02-21 NOTE — Patient Instructions (Signed)
 Visit Information  Thank you for taking time to visit with me today. Please don't hesitate to contact me if I can be of assistance to you.   Following are the goals we discussed today:   Goals Addressed               This Visit's Progress     COMPLETED: Receive Assistance Obtaining Alcohol & Drug Treatment Programs & Services. (pt-stated)   On track     Care Coordination Interventions:  Interventions Today    Flowsheet Row Most Recent Value  Chronic Disease   Chronic disease during today's visit Hypertension (HTN), Chronic Obstructive Pulmonary Disease (COPD), Other  General Interventions   General Interventions Discussed/Reviewed General Interventions Discussed, Labs, Vaccines, Doctor Visits, Health Screening, Annual Foot Exam, General Interventions Reviewed, Lipid Profile, Sick Day Rules, Communication with, [employer], Horticulturist, commercial (DME), Annual Eye Exam, Level of Care  Labs Hgb A1c every 3 months, Kidney Function, Hgb A1c annually  Vaccines COVID-19, Flu, Pneumonia, RSV, Shingles, Tetanus/Pertussis/Diphtheria  Doctor Visits Discussed/Reviewed Doctor Visits Discussed, Doctor Visits Reviewed, Annual Wellness Visits, PCP, Specialist  Health Screening Bone Density, Colonoscopy, Prostate  PCP/Specialist Visits Compliance with follow-up visit  Communication with PCP/Specialists, RN, Pharmacists, Social Work  Level of Care Adult Daycare, Air traffic controller, Assisted Living, Skilled Nursing Advertising copywriter, Personal Care Services  Exercise Interventions   Exercise Discussed/Reviewed Exercise Discussed, Assistive device use and maintanence, Exercise Reviewed, Physical Activity, Weight Managment  Physical Activity Discussed/Reviewed Physical Activity Discussed, Home Exercise Program (HEP), PREP, Physical Activity Reviewed, Gym, Types of exercise  Weight Management Weight maintenance  Education Interventions   Education Provided Provided Therapist, sports,  Provided Web-based Education, Provided Education  Provided Verbal Education On Nutrition, Mental Health/Coping with Illness, When to see the doctor, Foot Care, Eye Care, Labs, Blood Sugar Monitoring, Applications, Exercise, Medication, [employer], [employer]  Labs Reviewed Hgb A1c  Applications Medicaid, Personal Care Services  Mental Health Interventions   Mental Health Discussed/Reviewed Mental Health Discussed, Anxiety, Crisis, Suicide, Other, Coping Strategies, Substance Abuse, Mental Health Reviewed, Grief and Loss, Depression  Nutrition Interventions   Nutrition Discussed/Reviewed Nutrition Discussed, Adding fruits and vegetables, Increasing proteins, Decreasing fats, Nutrition Reviewed, Fluid intake, Carbohydrate meal planning, Portion sizes, Decreasing salt, Supplemental nutrition, Decreasing sugar intake  Pharmacy Interventions   Pharmacy Dicussed/Reviewed Pharmacy Topics Discussed, Medications and their functions, Medication Adherence, Pharmacy Topics Reviewed, Affording Medications  Safety Interventions   Safety Discussed/Reviewed Safety Discussed, Safety Reviewed, Fall Risk, Home Safety  Home Safety Assistive Devices, Need for home safety assessment, Refer for community resources  Advanced Directive Interventions   Advanced Directives Discussed/Reviewed Advanced Directives Discussed, Advanced Directives Reviewed      Active Listening & Reflection Utilized.  Verbalization of Feelings Encouraged.  Emotional Support Provided. Problem Solving Interventions Activated. Task-Centered Solutions Employed.   Solution-Focused Strategies Indicated. Acceptance & Commitment Therapy Initiated. Cognitive Behavioral Therapy Implemented. Client-Centered Therapy Performed. Reviewed List of Psychiatrists & Encouraged Self-Enrollment with Psychiatrist of Interest to Receive Psychotropic Medication Administration & Management, in An Effort to Reduce & Manage Symptoms of Anxiety &  Depression. Reviewed List of Therapists & Encouraged Self-Enrollment with Therapist of Interest to Receive Psychotherapeutic Counseling & Supportive Services, in An Effort to Reduce & Manage Symptoms of Anxiety & Depression, from List Provided. Reviewed List of Alcohol & Drug Treatment Programs & Encouraged Self-Enrollment with Alcohol & Drug Treatment Agencies of Interest.  Encouraged Engagement with Danford Bad, Licensed Clinical Social Worker with [facility], [facility] (#  [phone]), if You Have Questions, Need Assistance, Additional Social Work Needs Are Identified in The Near Future, or If You Change Your Mind About Wanting to Receive Social Work Services.      Please call the care guide team at [phone] if you need to cancel or reschedule your appointment.   If you are experiencing a Mental Health or Behavioral Health Crisis or need someone to talk to, please call the Suicide and Crisis Lifeline: 988 call the [place] National Suicide Prevention Lifeline: 414-177-1010 or TTY: 712-376-0795 TTY 805-382-5690) to talk to a trained counselor call 1-800-273-TALK (toll free, 24 hour hotline) go to [facility] [address], [place] [phone]) call the [facility] Crisis Line: [phone] call 911  Patient verbalizes understanding of instructions and care plan provided today and agrees to view in MyChart. Active MyChart status and patient understanding of how to access instructions and care plan via MyChart confirmed with patient.     No further follow up required.  Danford Bad, BSW, MSW, LCSW [facility], [facility] Clinical Social Worker II Direct Dial: [phone]  Fax: [phone] Website: [email or web address]

## 2024-02-21 NOTE — Progress Notes (Signed)
 Mardene Celeste LCSW called patient  02/19/24. Closing outreach   Gwenevere Ghazi  [facility]  [facility], [facility] Guide  Direct Dial: [phone]  Fax [phone]

## 2024-02-21 NOTE — Patient Outreach (Signed)
 Care Coordination   Follow Up Visit Note   02/21/2024  Name: [name] MRN: [id number] DOB: 1956/10/01  RYLAND TUNGATE is a 68 y.o. year old male who sees Lodema Hong, Milus Mallick, MD for primary care. I spoke with [name] and nephew, Egan Berkheimer by phone today.  What matters to the patients health and wellness today?  Receive Assistance Obtaining Alcohol & Drug Treatment Programs & Services.    Goals Addressed               This Visit's Progress     COMPLETED: Receive Assistance Obtaining Alcohol & Drug Treatment Programs & Services. (pt-stated)   On track     Care Coordination Interventions:  Interventions Today    Flowsheet Row Most Recent Value  Chronic Disease   Chronic disease during today's visit Hypertension (HTN), Chronic Obstructive Pulmonary Disease (COPD), Other  General Interventions   General Interventions Discussed/Reviewed General Interventions Discussed, Labs, Vaccines, Doctor Visits, Health Screening, Annual Foot Exam, General Interventions Reviewed, Lipid Profile, Sick Day Rules, Communication with, [employer], Horticulturist, commercial (DME), Annual Eye Exam, Level of Care  Labs Hgb A1c every 3 months, Kidney Function, Hgb A1c annually  Vaccines COVID-19, Flu, Pneumonia, RSV, Shingles, Tetanus/Pertussis/Diphtheria  Doctor Visits Discussed/Reviewed Doctor Visits Discussed, Doctor Visits Reviewed, Annual Wellness Visits, PCP, Specialist  Health Screening Bone Density, Colonoscopy, Prostate  PCP/Specialist Visits Compliance with follow-up visit  Communication with PCP/Specialists, RN, Pharmacists, Social Work  Level of Care Adult Daycare, Air traffic controller, Assisted Living, Skilled Nursing Advertising copywriter, Personal Care Services  Exercise Interventions   Exercise Discussed/Reviewed Exercise Discussed, Assistive device use and maintanence, Exercise Reviewed, Physical Activity, Weight Managment  Physical Activity  Discussed/Reviewed Physical Activity Discussed, Home Exercise Program (HEP), PREP, Physical Activity Reviewed, Gym, Types of exercise  Weight Management Weight maintenance  Education Interventions   Education Provided Provided Therapist, sports, Provided Web-based Education, Provided Education  Provided Verbal Education On Nutrition, Mental Health/Coping with Illness, When to see the doctor, Foot Care, Eye Care, Labs, Blood Sugar Monitoring, Applications, Exercise, Medication, [employer], [employer]  Labs Reviewed Hgb A1c  Applications Medicaid, Personal Care Services  Mental Health Interventions   Mental Health Discussed/Reviewed Mental Health Discussed, Anxiety, Crisis, Suicide, Other, Coping Strategies, Substance Abuse, Mental Health Reviewed, Grief and Loss, Depression  Nutrition Interventions   Nutrition Discussed/Reviewed Nutrition Discussed, Adding fruits and vegetables, Increasing proteins, Decreasing fats, Nutrition Reviewed, Fluid intake, Carbohydrate meal planning, Portion sizes, Decreasing salt, Supplemental nutrition, Decreasing sugar intake  Pharmacy Interventions   Pharmacy Dicussed/Reviewed Pharmacy Topics Discussed, Medications and their functions, Medication Adherence, Pharmacy Topics Reviewed, Affording Medications  Safety Interventions   Safety Discussed/Reviewed Safety Discussed, Safety Reviewed, Fall Risk, Home Safety  Home Safety Assistive Devices, Need for home safety assessment, Refer for community resources  Advanced Directive Interventions   Advanced Directives Discussed/Reviewed Advanced Directives Discussed, Advanced Directives Reviewed      Active Listening & Reflection Utilized.  Verbalization of Feelings Encouraged.  Emotional Support Provided. Problem Solving Interventions Activated. Task-Centered Solutions Employed.   Solution-Focused Strategies Indicated. Acceptance & Commitment Therapy Initiated. Cognitive Behavioral Therapy  Implemented. Client-Centered Therapy Performed. Reviewed List of Psychiatrists & Encouraged Self-Enrollment with Psychiatrist of Interest to Receive Psychotropic Medication Administration & Management, in An Effort to Reduce & Manage Symptoms of Anxiety & Depression. Reviewed List of Therapists & Encouraged Self-Enrollment with Therapist of Interest to Receive Psychotherapeutic Counseling & Supportive Services, in An Effort to Reduce & Manage Symptoms of Anxiety & Depression, from  List Provided. Reviewed List of Alcohol & Drug Treatment Programs & Encouraged Self-Enrollment with Alcohol & Drug Treatment Agencies of Interest.  Encouraged Engagement with Danford Bad, Licensed Clinical Social Worker with [facility], Population Health [phone]), if You Have Questions, Need Assistance, Additional Social Work Needs Are Identified in The Near Future, or If You Change Your Mind About Wanting to Receive Social Work Services.      SDOH assessments and interventions completed:  Yes.  Care Coordination Interventions:  Yes, provided.   Follow up plan: No further intervention required.   Encounter Outcome:  Patient Visit Completed.   Danford Bad, BSW, MSW, LCSW [facility], [facility] Clinical Social Worker II Direct Dial: [phone]  Fax: [phone] Website: [email or web address]

## 2024-06-29 ENCOUNTER — Other Ambulatory Visit: Payer: Self-pay | Admitting: Family Medicine

## 2024-07-19 ENCOUNTER — Other Ambulatory Visit: Payer: Self-pay | Admitting: Family Medicine

## 2024-09-23 ENCOUNTER — Inpatient Hospital Stay (HOSPITAL_COMMUNITY)
Admission: EM | Admit: 2024-09-23 | Discharge: 2024-09-26 | DRG: 641 | Disposition: A | Discharge status: Home / Self Care | Attending: Internal Medicine | Admitting: Internal Medicine

## 2024-09-23 ENCOUNTER — Encounter (HOSPITAL_COMMUNITY): Payer: Self-pay

## 2024-09-23 ENCOUNTER — Other Ambulatory Visit: Payer: Self-pay

## 2024-09-23 ENCOUNTER — Emergency Department (HOSPITAL_COMMUNITY)

## 2024-09-23 DIAGNOSIS — F17210 Nicotine dependence, cigarettes, uncomplicated: Secondary | ICD-10-CM | POA: Diagnosis present

## 2024-09-23 DIAGNOSIS — Z79899 Other long term (current) drug therapy: Secondary | ICD-10-CM

## 2024-09-23 DIAGNOSIS — W3189XA Contact with other specified machinery, initial encounter: Secondary | ICD-10-CM

## 2024-09-23 DIAGNOSIS — R0789 Other chest pain: Secondary | ICD-10-CM | POA: Diagnosis present

## 2024-09-23 DIAGNOSIS — E860 Dehydration: Secondary | ICD-10-CM | POA: Diagnosis present

## 2024-09-23 DIAGNOSIS — K219 Gastro-esophageal reflux disease without esophagitis: Secondary | ICD-10-CM | POA: Diagnosis present

## 2024-09-23 DIAGNOSIS — Z888 Allergy status to other drugs, medicaments and biological substances status: Secondary | ICD-10-CM

## 2024-09-23 DIAGNOSIS — F10920 Alcohol use, unspecified with intoxication, uncomplicated: Secondary | ICD-10-CM

## 2024-09-23 DIAGNOSIS — E871 Hypo-osmolality and hyponatremia: Principal | ICD-10-CM | POA: Diagnosis present

## 2024-09-23 DIAGNOSIS — Z7951 Long term (current) use of inhaled steroids: Secondary | ICD-10-CM

## 2024-09-23 DIAGNOSIS — F418 Other specified anxiety disorders: Secondary | ICD-10-CM | POA: Diagnosis present

## 2024-09-23 DIAGNOSIS — I471 Supraventricular tachycardia, unspecified: Secondary | ICD-10-CM | POA: Diagnosis present

## 2024-09-23 DIAGNOSIS — J449 Chronic obstructive pulmonary disease, unspecified: Secondary | ICD-10-CM | POA: Diagnosis present

## 2024-09-23 DIAGNOSIS — I10 Essential (primary) hypertension: Secondary | ICD-10-CM | POA: Diagnosis present

## 2024-09-23 DIAGNOSIS — Z833 Family history of diabetes mellitus: Secondary | ICD-10-CM

## 2024-09-23 DIAGNOSIS — F32A Depression, unspecified: Secondary | ICD-10-CM | POA: Diagnosis present

## 2024-09-23 DIAGNOSIS — Z8249 Family history of ischemic heart disease and other diseases of the circulatory system: Secondary | ICD-10-CM

## 2024-09-23 DIAGNOSIS — F1020 Alcohol dependence, uncomplicated: Secondary | ICD-10-CM | POA: Diagnosis present

## 2024-09-23 DIAGNOSIS — J4489 Other specified chronic obstructive pulmonary disease: Secondary | ICD-10-CM | POA: Diagnosis present

## 2024-09-23 DIAGNOSIS — E785 Hyperlipidemia, unspecified: Secondary | ICD-10-CM | POA: Diagnosis present

## 2024-09-23 DIAGNOSIS — I2510 Atherosclerotic heart disease of native coronary artery without angina pectoris: Secondary | ICD-10-CM | POA: Diagnosis present

## 2024-09-23 DIAGNOSIS — F10220 Alcohol dependence with intoxication, uncomplicated: Secondary | ICD-10-CM | POA: Diagnosis present

## 2024-09-23 DIAGNOSIS — F411 Generalized anxiety disorder: Secondary | ICD-10-CM | POA: Diagnosis present

## 2024-09-23 NOTE — ED Notes (Signed)
 Erminio, sister,  to be contacted if needed [phone]

## 2024-09-23 NOTE — ED Provider Notes (Signed)
 [place] EMERGENCY DEPARTMENT AT [facility]  Provider Note  CSN: [id number] Arrival date & time: 09/23/24 2133  History No chief complaint on file.   [name] is a 68 y.o. male brought to the ED via EMS for evaluation of L lower rib pain after falling off his lawnmower earlier today. The fall happened around 1600hrs, he reports pain started later. No SOB. Pain is worse with movement. Denies any other injuries. He has been drinking EtOH today.    Home Medications Prior to Admission medications   Medication Sig Start Date End Date Taking? Authorizing Provider  albuterol  (VENTOLIN  HFA) 108 (90 Base) MCG/ACT inhaler Inhale 2 puffs into the lungs every 6 (six) hours as needed for wheezing or shortness of breath. 01/14/24   Antonetta Rollene BRAVO, MD  amLODipine  (NORVASC ) 10 MG tablet Take 1 tablet (10 mg total) by mouth daily. 02/18/24   Antonetta Rollene BRAVO, MD  citalopram  (CELEXA ) 10 MG tablet Take 1 tablet (10 mg total) by mouth daily. 02/18/24   Antonetta Rollene BRAVO, MD  diltiazem  (CARDIZEM  CD) 120 MG 24 hr capsule Take 1 capsule (120 mg total) by mouth daily. 02/18/24   Antonetta Rollene BRAVO, MD  ergocalciferol  (VITAMIN D2) 1.25 MG (50000 UT) capsule Take 1 capsule (50,000 Units total) by mouth once a week. One capsule once weekly 04/29/22   Antonetta Rollene BRAVO, MD  ipratropium-albuterol  (DUONEB) 0.5-2.5 (3) MG/3ML SOLN INHALE 1 VIAL VIA NEBULIZER EVERY SIX HOURS AS NEEDED FOR WHEEZING/ SHORTNESS OF BREATH 04/12/21   Antonetta Rollene BRAVO, MD  rosuvastatin  (CRESTOR ) 5 MG tablet Take 1 tablet (5 mg total) by mouth daily. 06/29/24   Antonetta Rollene BRAVO, MD  spironolactone  (ALDACTONE ) 25 MG tablet Take 1 tablet (25 mg total) by mouth daily. 03/22/23   Antonetta Rollene BRAVO, MD  fluticasone  (FLOVENT  DISKUS) 50 MCG/BLIST diskus inhaler Inhale 1 puff into the lungs 2 (two) times daily. 12/13/20 02/12/21  Antonetta Rollene BRAVO, MD     Allergies    Lisinopril    Review of Systems   Review of  Systems Please see HPI for pertinent positives and negatives  Physical Exam BP (!) 177/111   Pulse 100   Temp 98.2 F (36.8 C)   Resp 16   SpO2 99%   Physical Exam Vitals and nursing note reviewed.  Constitutional:      Appearance: Normal appearance.  HENT:     Head: Normocephalic and atraumatic.     Nose: Nose normal.     Mouth/Throat:     Mouth: Mucous membranes are moist.  Eyes:     Extraocular Movements: Extraocular movements intact.     Conjunctiva/sclera: Conjunctivae normal.  Cardiovascular:     Rate and Rhythm: Normal rate.  Pulmonary:     Effort: Pulmonary effort is normal.     Breath sounds: Normal breath sounds. No wheezing.  Chest:     Chest wall: Tenderness (L lower ribs) present.  Abdominal:     General: Abdomen is flat.     Palpations: Abdomen is soft.     Tenderness: There is no abdominal tenderness. There is no guarding.  Musculoskeletal:        General: No swelling or tenderness. Normal range of motion.     Cervical back: Neck supple. No tenderness.  Skin:    General: Skin is warm and dry.  Neurological:     General: No focal deficit present.     Mental Status: He is alert.  Psychiatric:  Mood and Affect: Mood normal.     ED Results / Procedures / Treatments   EKG None  Procedures Procedures  Medications Ordered in the ED Medications - No data to display  Initial Impression and Plan  Patient here with chest pain, most likely chest wall vs rib fracture after fall, but given comorbidities, will check for other causes such as ACS, pancreatitis, PTX. Labs and xray ordered. He was sleeping soundly on my initial evaluation, will hold off on analgesia pending workup.   ED Course       MDM Rules/Calculators/A&P Medical Decision Making    Final Clinical Impression(s) / ED Diagnoses Final diagnoses:  None    Rx / DC Orders ED Discharge Orders     None

## 2024-09-23 NOTE — ED Notes (Signed)
Pt speaking to sister on the phone  

## 2024-09-23 NOTE — ED Triage Notes (Addendum)
 Pt via [facility] for fall after falling off lawnmower tonight. C/o left upper abdominal pain. Pt admits to having a couple beers tonight.

## 2024-09-24 ENCOUNTER — Encounter (HOSPITAL_COMMUNITY): Payer: Self-pay | Admitting: Family Medicine

## 2024-09-24 ENCOUNTER — Emergency Department (HOSPITAL_COMMUNITY)

## 2024-09-24 DIAGNOSIS — W3189XA Contact with other specified machinery, initial encounter: Secondary | ICD-10-CM | POA: Diagnosis not present

## 2024-09-24 DIAGNOSIS — R0789 Other chest pain: Secondary | ICD-10-CM

## 2024-09-24 DIAGNOSIS — I2510 Atherosclerotic heart disease of native coronary artery without angina pectoris: Secondary | ICD-10-CM | POA: Diagnosis present

## 2024-09-24 DIAGNOSIS — Z833 Family history of diabetes mellitus: Secondary | ICD-10-CM | POA: Diagnosis not present

## 2024-09-24 DIAGNOSIS — F17210 Nicotine dependence, cigarettes, uncomplicated: Secondary | ICD-10-CM | POA: Diagnosis present

## 2024-09-24 DIAGNOSIS — F10220 Alcohol dependence with intoxication, uncomplicated: Secondary | ICD-10-CM | POA: Diagnosis present

## 2024-09-24 DIAGNOSIS — K219 Gastro-esophageal reflux disease without esophagitis: Secondary | ICD-10-CM | POA: Diagnosis present

## 2024-09-24 DIAGNOSIS — E871 Hypo-osmolality and hyponatremia: Secondary | ICD-10-CM | POA: Diagnosis present

## 2024-09-24 DIAGNOSIS — I471 Supraventricular tachycardia, unspecified: Secondary | ICD-10-CM | POA: Diagnosis present

## 2024-09-24 DIAGNOSIS — E860 Dehydration: Secondary | ICD-10-CM | POA: Diagnosis present

## 2024-09-24 DIAGNOSIS — F411 Generalized anxiety disorder: Secondary | ICD-10-CM

## 2024-09-24 DIAGNOSIS — I10 Essential (primary) hypertension: Secondary | ICD-10-CM | POA: Diagnosis present

## 2024-09-24 DIAGNOSIS — F418 Other specified anxiety disorders: Secondary | ICD-10-CM

## 2024-09-24 DIAGNOSIS — F32A Depression, unspecified: Secondary | ICD-10-CM | POA: Diagnosis present

## 2024-09-24 DIAGNOSIS — Z79899 Other long term (current) drug therapy: Secondary | ICD-10-CM | POA: Diagnosis not present

## 2024-09-24 DIAGNOSIS — J4489 Other specified chronic obstructive pulmonary disease: Secondary | ICD-10-CM | POA: Diagnosis present

## 2024-09-24 DIAGNOSIS — Z7951 Long term (current) use of inhaled steroids: Secondary | ICD-10-CM | POA: Diagnosis not present

## 2024-09-24 DIAGNOSIS — E785 Hyperlipidemia, unspecified: Secondary | ICD-10-CM | POA: Diagnosis present

## 2024-09-24 DIAGNOSIS — F1029 Alcohol dependence with unspecified alcohol-induced disorder: Secondary | ICD-10-CM | POA: Diagnosis not present

## 2024-09-24 DIAGNOSIS — J449 Chronic obstructive pulmonary disease, unspecified: Secondary | ICD-10-CM

## 2024-09-24 DIAGNOSIS — Z8249 Family history of ischemic heart disease and other diseases of the circulatory system: Secondary | ICD-10-CM | POA: Diagnosis not present

## 2024-09-24 DIAGNOSIS — Z888 Allergy status to other drugs, medicaments and biological substances status: Secondary | ICD-10-CM | POA: Diagnosis not present

## 2024-09-24 LAB — BASIC METABOLIC PANEL WITH GFR
Anion gap: 14 (ref 5–15)
Anion gap: 18 — ABNORMAL HIGH (ref 5–15)
Anion gap: 12 (ref 5–15)
BUN: <5 mg/dL — ABNORMAL LOW (ref 8–23)
BUN: 7 mg/dL — ABNORMAL LOW (ref 8–23)
BUN: 7 mg/dL — ABNORMAL LOW (ref 8–23)
CO2: 21 mmol/L — ABNORMAL LOW (ref 22–32)
CO2: 19 mmol/L — ABNORMAL LOW (ref 22–32)
CO2: 22 mmol/L (ref 22–32)
Calcium: 8.5 mg/dL — ABNORMAL LOW (ref 8.9–10.3)
Calcium: 9 mg/dL (ref 8.9–10.3)
Calcium: 9 mg/dL (ref 8.9–10.3)
Chloride: 81 mmol/L — ABNORMAL LOW (ref 98–111)
Chloride: 80 mmol/L — ABNORMAL LOW (ref 98–111)
Chloride: 82 mmol/L — ABNORMAL LOW (ref 98–111)
Creatinine, Ser: 0.66 mg/dL (ref 0.61–1.24)
Creatinine, Ser: 0.76 mg/dL (ref 0.61–1.24)
Creatinine, Ser: 0.79 mg/dL (ref 0.61–1.24)
GFR, Estimated: >60 mL/min (ref >60)
GFR, Estimated: >60 mL/min (ref >60)
GFR, Estimated: >60 mL/min (ref >60)
Glucose, Bld: 77 mg/dL (ref 70–99)
Glucose, Bld: 50 mg/dL — ABNORMAL LOW (ref 70–99)
Glucose, Bld: 65 mg/dL — ABNORMAL LOW (ref 70–99)
Potassium: 4.5 mmol/L (ref 3.5–5.1)
Potassium: 4 mmol/L (ref 3.5–5.1)
Potassium: 4.8 mmol/L (ref 3.5–5.1)
Sodium: 116 mmol/L — CL (ref 135–145)
Sodium: 117 mmol/L — CL (ref 135–145)
Sodium: 116 mmol/L — CL (ref 135–145)

## 2024-09-24 LAB — COMPREHENSIVE METABOLIC PANEL WITH GFR
ALT: 30 U/L (ref 0–44)
AST: 37 U/L (ref 15–41)
Albumin: 4.3 g/dL (ref 3.5–5.0)
Alkaline Phosphatase: 57 U/L (ref 38–126)
Anion gap: 11 (ref 5–15)
BUN: <5 mg/dL — ABNORMAL LOW (ref 8–23)
CO2: 24 mmol/L (ref 22–32)
Calcium: 8.6 mg/dL — ABNORMAL LOW (ref 8.9–10.3)
Chloride: 81 mmol/L — ABNORMAL LOW (ref 98–111)
Creatinine, Ser: 0.75 mg/dL (ref 0.61–1.24)
GFR, Estimated: >60 mL/min (ref >60)
Glucose, Bld: 87 mg/dL (ref 70–99)
Potassium: 4.3 mmol/L (ref 3.5–5.1)
Sodium: 116 mmol/L — CL (ref 135–145)
Total Bilirubin: 0.6 mg/dL (ref 0.0–1.2)
Total Protein: 7.1 g/dL (ref 6.5–8.1)

## 2024-09-24 LAB — TROPONIN T, HIGH SENSITIVITY
Troponin T High Sensitivity: <15 ng/L (ref 0–19)
Troponin T High Sensitivity: <15 ng/L (ref 0–19)

## 2024-09-24 LAB — CBC WITH DIFFERENTIAL/PLATELET
Abs Immature Granulocytes: 0.04 K/uL (ref 0.00–0.07)
Basophils Absolute: 0.1 K/uL (ref 0.0–0.1)
Basophils Relative: 1 %
Eosinophils Absolute: 0 K/uL (ref 0.0–0.5)
Eosinophils Relative: 1 %
HCT: 39.9 % (ref 39.0–52.0)
Hemoglobin: 14.2 g/dL (ref 13.0–17.0)
Immature Granulocytes: 1 %
Lymphocytes Relative: 23 %
Lymphs Abs: 1.6 K/uL (ref 0.7–4.0)
MCH: 30 pg (ref 26.0–34.0)
MCHC: 35.6 g/dL (ref 30.0–36.0)
MCV: 84.2 fL (ref 80.0–100.0)
Monocytes Absolute: 0.7 K/uL (ref 0.1–1.0)
Monocytes Relative: 10 %
Neutro Abs: 4.5 K/uL (ref 1.7–7.7)
Neutrophils Relative %: 64 %
Platelets: 220 K/uL (ref 150–400)
RBC: 4.74 MIL/uL (ref 4.22–5.81)
RDW: 12.3 % (ref 11.5–15.5)
WBC: 7 K/uL (ref 4.0–10.5)
nRBC: 0 % (ref 0.0–0.2)

## 2024-09-24 LAB — MAGNESIUM: Magnesium: 1.8 mg/dL (ref 1.7–2.4)

## 2024-09-24 LAB — URINALYSIS, ROUTINE W REFLEX MICROSCOPIC
Bacteria, UA: NONE SEEN
Bilirubin Urine: NEGATIVE
Glucose, UA: NEGATIVE mg/dL
Hgb urine dipstick: NEGATIVE
Ketones, ur: NEGATIVE mg/dL
Leukocytes,Ua: NEGATIVE
Nitrite: NEGATIVE
Protein, ur: 100 mg/dL — AB
Specific Gravity, Urine: 1.01 (ref 1.005–1.030)
pH: 6 (ref 5.0–8.0)

## 2024-09-24 LAB — SODIUM, URINE, RANDOM: Sodium, Ur: 68 mmol/L

## 2024-09-24 LAB — CBC
HCT: 40.9 % (ref 39.0–52.0)
Hemoglobin: 14.6 g/dL (ref 13.0–17.0)
MCH: 30.2 pg (ref 26.0–34.0)
MCHC: 35.7 g/dL (ref 30.0–36.0)
MCV: 84.7 fL (ref 80.0–100.0)
Platelets: 214 K/uL (ref 150–400)
RBC: 4.83 MIL/uL (ref 4.22–5.81)
RDW: 12.3 % (ref 11.5–15.5)
WBC: 7 K/uL (ref 4.0–10.5)
nRBC: 0 % (ref 0.0–0.2)

## 2024-09-24 LAB — ETHANOL: Alcohol, Ethyl (B): 232 mg/dL — ABNORMAL HIGH (ref <15)

## 2024-09-24 LAB — LIPASE, BLOOD: Lipase: 19 U/L (ref 11–51)

## 2024-09-24 LAB — PHOSPHORUS: Phosphorus: 3.2 mg/dL (ref 2.5–4.6)

## 2024-09-24 LAB — OSMOLALITY, URINE: Osmolality, Ur: 408 mosm/kg (ref 300–900)

## 2024-09-24 LAB — OSMOLALITY: Osmolality: 305 mosm/kg — ABNORMAL HIGH (ref 275–295)

## 2024-09-24 LAB — TSH: TSH: 1.34 u[IU]/mL (ref 0.350–4.500)

## 2024-09-24 LAB — VITAMIN B12: Vitamin B-12: 461 pg/mL (ref 180–914)

## 2024-09-24 LAB — MRSA NEXT GEN BY PCR, NASAL: MRSA by PCR Next Gen: NOT DETECTED

## 2024-09-24 MED ORDER — DILTIAZEM HCL ER COATED BEADS 120 MG PO CP24
120.0000 mg | ORAL_CAPSULE | Freq: Every day | ORAL | Status: DC
  Administered 2024-09-24 – 2024-09-26 (×3): 120 mg via ORAL
  Filled 2024-09-24 (×3): qty 1

## 2024-09-24 MED ORDER — LORAZEPAM 1 MG PO TABS
0.0000 mg | ORAL_TABLET | Freq: Three times a day (TID) | ORAL | Status: DC
  Administered 2024-09-26: 1 mg via ORAL
  Filled 2024-09-24: qty 1

## 2024-09-24 MED ORDER — FOLIC ACID 1 MG PO TABS
1.0000 mg | ORAL_TABLET | Freq: Every day | ORAL | Status: DC
  Administered 2024-09-24 – 2024-09-26 (×3): 1 mg via ORAL
  Filled 2024-09-24 (×3): qty 1

## 2024-09-24 MED ORDER — ENOXAPARIN SODIUM 40 MG/0.4ML IJ SOSY
40.0000 mg | PREFILLED_SYRINGE | INTRAMUSCULAR | Status: DC
Start: 2024-09-24 — End: 2024-09-26
  Administered 2024-09-24 – 2024-09-26 (×3): 40 mg via SUBCUTANEOUS
  Filled 2024-09-24 (×3): qty 0.4

## 2024-09-24 MED ORDER — KETOROLAC TROMETHAMINE 30 MG/ML IJ SOLN
15.0000 mg | Freq: Once | INTRAMUSCULAR | Status: AC
  Administered 2024-09-24: 15 mg via INTRAVENOUS
  Filled 2024-09-24: qty 1

## 2024-09-24 MED ORDER — ONDANSETRON HCL 4 MG PO TABS: 4.0000 mg | ORAL_TABLET | Freq: Four times a day (QID) | ORAL | Status: DC | PRN

## 2024-09-24 MED ORDER — ACETAMINOPHEN 325 MG PO TABS
650.0000 mg | ORAL_TABLET | Freq: Four times a day (QID) | ORAL | Status: DC | PRN
  Administered 2024-09-24 – 2024-09-25 (×2): 650 mg via ORAL
  Filled 2024-09-24 (×3): qty 2

## 2024-09-24 MED ORDER — LORAZEPAM 1 MG PO TABS
0.0000 mg | ORAL_TABLET | ORAL | Status: AC
  Administered 2024-09-25 (×2): 1 mg via ORAL
  Filled 2024-09-24 (×2): qty 1

## 2024-09-24 MED ORDER — SENNOSIDES-DOCUSATE SODIUM 8.6-50 MG PO TABS: 1.0000 | ORAL_TABLET | Freq: Every evening | ORAL | Status: DC | PRN

## 2024-09-24 MED ORDER — SODIUM CHLORIDE 0.9 % IV SOLN: INTRAVENOUS | Status: AC

## 2024-09-24 MED ORDER — ROSUVASTATIN CALCIUM 10 MG PO TABS
5.0000 mg | ORAL_TABLET | Freq: Every day | ORAL | Status: DC
  Administered 2024-09-24 – 2024-09-26 (×3): 5 mg via ORAL
  Filled 2024-09-24 (×3): qty 1

## 2024-09-24 MED ORDER — CHLORHEXIDINE GLUCONATE CLOTH 2 % EX PADS
6.0000 | MEDICATED_PAD | Freq: Every day | CUTANEOUS | Status: DC
  Administered 2024-09-25: 6 via TOPICAL

## 2024-09-24 MED ORDER — IPRATROPIUM-ALBUTEROL 0.5-2.5 (3) MG/3ML IN SOLN
3.0000 mL | Freq: Four times a day (QID) | RESPIRATORY_TRACT | Status: DC | PRN
  Administered 2024-09-25: 3 mL via RESPIRATORY_TRACT
  Filled 2024-09-24: qty 3

## 2024-09-24 MED ORDER — ADULT MULTIVITAMIN W/MINERALS CH
1.0000 | ORAL_TABLET | Freq: Every day | ORAL | Status: DC
  Administered 2024-09-24 – 2024-09-26 (×3): 1 via ORAL
  Filled 2024-09-24 (×3): qty 1

## 2024-09-24 MED ORDER — PANTOPRAZOLE SODIUM 40 MG PO TBEC
40.0000 mg | DELAYED_RELEASE_TABLET | Freq: Every day | ORAL | Status: DC
  Administered 2024-09-24 – 2024-09-26 (×3): 40 mg via ORAL
  Filled 2024-09-24 (×2): qty 1

## 2024-09-24 MED ORDER — LORAZEPAM 1 MG PO TABS: 1.0000 mg | ORAL_TABLET | ORAL | Status: DC | PRN

## 2024-09-24 MED ORDER — SODIUM CHLORIDE 0.9% FLUSH
3.0000 mL | Freq: Two times a day (BID) | INTRAVENOUS | Status: DC
  Administered 2024-09-24 – 2024-09-26 (×6): 3 mL via INTRAVENOUS

## 2024-09-24 MED ORDER — AMLODIPINE BESYLATE 5 MG PO TABS
10.0000 mg | ORAL_TABLET | Freq: Every day | ORAL | Status: DC
  Administered 2024-09-24 – 2024-09-26 (×3): 10 mg via ORAL
  Filled 2024-09-24 (×3): qty 2

## 2024-09-24 MED ORDER — LORAZEPAM 2 MG/ML IJ SOLN
1.0000 mg | INTRAMUSCULAR | Status: DC | PRN
  Administered 2024-09-24: 2 mg via INTRAVENOUS
  Administered 2024-09-24: 1 mg via INTRAVENOUS
  Filled 2024-09-24 (×2): qty 1

## 2024-09-24 MED ORDER — ACETAMINOPHEN 650 MG RE SUPP: 650.0000 mg | Freq: Four times a day (QID) | RECTAL | Status: DC | PRN

## 2024-09-24 MED ORDER — CITALOPRAM HYDROBROMIDE 10 MG PO TABS
10.0000 mg | ORAL_TABLET | Freq: Every day | ORAL | Status: DC
  Administered 2024-09-24 – 2024-09-26 (×3): 10 mg via ORAL
  Filled 2024-09-24 (×3): qty 1

## 2024-09-24 MED ORDER — THIAMINE MONONITRATE 100 MG PO TABS
100.0000 mg | ORAL_TABLET | Freq: Every day | ORAL | Status: DC
  Administered 2024-09-24 – 2024-09-26 (×3): 100 mg via ORAL
  Filled 2024-09-24 (×3): qty 1

## 2024-09-24 MED ORDER — ONDANSETRON HCL 4 MG/2ML IJ SOLN
4.0000 mg | Freq: Four times a day (QID) | INTRAMUSCULAR | Status: DC | PRN
  Administered 2024-09-24: 4 mg via INTRAVENOUS
  Filled 2024-09-24: qty 2

## 2024-09-24 MED ORDER — THIAMINE HCL 100 MG/ML IJ SOLN: 100.0000 mg | Freq: Every day | INTRAMUSCULAR | Status: DC

## 2024-09-24 MED ORDER — TRAMADOL HCL 50 MG PO TABS
50.0000 mg | ORAL_TABLET | Freq: Three times a day (TID) | ORAL | Status: DC | PRN
  Administered 2024-09-24 – 2024-09-26 (×6): 50 mg via ORAL
  Filled 2024-09-24 (×6): qty 1

## 2024-09-24 MED ORDER — METHOCARBAMOL 500 MG PO TABS
500.0000 mg | ORAL_TABLET | Freq: Three times a day (TID) | ORAL | Status: DC | PRN
Start: 2024-09-24 — End: 2024-09-26
  Administered 2024-09-25 – 2024-09-26 (×2): 500 mg via ORAL
  Filled 2024-09-24 (×2): qty 1

## 2024-09-24 NOTE — Progress Notes (Signed)
 Date and time results received: 09/24/24 1840 (use smartphrase .now to insert current time)  Test: sodium Critical Value: 116  Name of Provider Notified: Dr. Ricky  Orders Received? Or Actions Taken?: MD aware. Awaiting new orders. Normal saline infusing

## 2024-09-24 NOTE — ED Notes (Signed)
 Urinal provided.

## 2024-09-24 NOTE — ED Notes (Signed)
 Updated Leanor and Erminio on plan for admission. Also requesting pain medication, MD Made aware

## 2024-09-24 NOTE — Progress Notes (Signed)
  Met with pt to discuss substance use and available resources. Pt shares that he has been to rehab in the past but, shared it has been a long time ago. Pt is agreeable to have resources for outpatient and residential substance use programs placed on his discharge instructions. SA resources have been placed on AVS.    09/24/24 1034  TOC Brief Assessment  Insurance and Status Reviewed  Patient has primary care physician Yes  Home environment has been reviewed Single family home  Prior level of function: Independent  Prior/Current Home Services No current home services  Social Drivers of Health Review SDOH reviewed no interventions necessary  Readmission risk has been reviewed Yes  Transition of care needs no transition of care needs at this time

## 2024-09-24 NOTE — H&P (Addendum)
 History and Physical    [name] FMW:[id number] DOB: 1956/05/09 DOA: 09/23/2024  PCP: Antonetta Rollene BRAVO, MD   Patient coming from: Home   Chief Complaint: left rib pain after falling off of lawnmower   HPI: [name] is a 68 y.o. male with medical history significant for hypertension, COPD, anxiety, coronary artery calcifications, paroxysmal SVT, and alcoholism who presents with lower left rib pain after falling off of a lawnmower today.  Patient reports that he was in his usual state of health when he fell off of his lawnmower today but has been experiencing severe pain in the lower left lateral chest anytime he takes a deep breath or coughs.  He denies any other appreciable injury and did not lose consciousness.  He had already consumed a 12 pack of beer today which is typical for him.  Patient notes that he also drinks a lot of water while working outside.  He denies any headache, focal numbness or weakness, or change in vision.  ED Course: Upon arrival to the ED, patient is found to be afebrile and saturating well on room air with normal HR and elevated BP.  Labs are most notable for sodium 116, normal creatinine, normal WBC, undetectable troponin x 2, normal LFTs and lipase, and ethanol 232.  There are no acute findings on head CT, chest x-ray, or plain films of the left ribs.  Patient was treated with Toradol in the ED.  Review of Systems:  All other systems reviewed and apart from HPI, are negative.  Past Medical History:  Diagnosis Date   Alcohol abuse    Allergic rhinitis    Asthma    COPD (chronic obstructive pulmonary disease) (HCC)    Coronary atherosclerosis    Based on chest CT 2012   Essential hypertension    History of neutropenia    Also lymphocytosis - followed by Dr. Lawernce   Nicotine  addiction    Olecranon bursitis, right elbow    PSVT (paroxysmal supraventricular tachycardia)    Document in April 2022    Past Surgical History:  Procedure  Laterality Date   COLONOSCOPY     MULTIPLE TOOTH EXTRACTIONS  June 2012   OLECRANON BURSECTOMY Right 11/26/2019   Procedure: MERRI JUSTICE;  Surgeon: Margrette Taft BRAVO, MD;  Location: [facility];  Service: Orthopedics;  Laterality: Right;  pt knows to arrive at 6:15    Social History:   reports that he has been smoking cigarettes. He has a 19 pack-year smoking history. He has been exposed to tobacco smoke. He has never used smokeless tobacco. He reports current alcohol use of about 12.0 standard drinks of alcohol per week. He reports that he does not use drugs.  Allergies  Allergen Reactions   Lisinopril  Swelling and Other (See Comments)     Angioedema    Family History  Problem Relation Age of Onset   Diabetes Mother    Hypertension Mother    Diabetes Father    Hypertension Sister      Prior to Admission medications   Medication Sig Start Date End Date Taking? Authorizing Provider  albuterol  (VENTOLIN  HFA) 108 (90 Base) MCG/ACT inhaler Inhale 2 puffs into the lungs every 6 (six) hours as needed for wheezing or shortness of breath. 01/14/24   Antonetta Rollene BRAVO, MD  amLODipine  (NORVASC ) 10 MG tablet Take 1 tablet (10 mg total) by mouth daily. 02/18/24   Antonetta Rollene BRAVO, MD  citalopram  (CELEXA ) 10 MG tablet Take 1 tablet (10 mg  total) by mouth daily. 02/18/24   Antonetta Rollene BRAVO, MD  diltiazem  (CARDIZEM  CD) 120 MG 24 hr capsule Take 1 capsule (120 mg total) by mouth daily. 02/18/24   Antonetta Rollene BRAVO, MD  ergocalciferol  (VITAMIN D2) 1.25 MG (50000 UT) capsule Take 1 capsule (50,000 Units total) by mouth once a week. One capsule once weekly 04/29/22   Antonetta Rollene BRAVO, MD  ipratropium-albuterol  (DUONEB) 0.5-2.5 (3) MG/3ML SOLN INHALE 1 VIAL VIA NEBULIZER EVERY SIX HOURS AS NEEDED FOR WHEEZING/ SHORTNESS OF BREATH 04/12/21   Antonetta Rollene BRAVO, MD  rosuvastatin  (CRESTOR ) 5 MG tablet Take 1 tablet (5 mg total) by mouth daily. 06/29/24   Antonetta Rollene BRAVO, MD  spironolactone   (ALDACTONE ) 25 MG tablet Take 1 tablet (25 mg total) by mouth daily. 03/22/23   Antonetta Rollene BRAVO, MD  fluticasone  (FLOVENT  DISKUS) 50 MCG/BLIST diskus inhaler Inhale 1 puff into the lungs 2 (two) times daily. 12/13/20 02/12/21  Antonetta Rollene BRAVO, MD    Physical Exam: Vitals:   09/24/24 0118 09/24/24 0145 09/24/24 0200 09/24/24 0215  BP:  (!) 171/108 (!) 146/99 (!) 140/92  Pulse:  81 81 80  Resp:      Temp: 98.3 F (36.8 C)     TempSrc: Oral     SpO2:  93% 94% 93%    Constitutional: NAD, calm  Eyes: PERTLA, lids and conjunctivae normal ENMT: Mucous membranes are moist. Posterior pharynx clear of any exudate or lesions.   Neck: supple, no masses  Respiratory: no wheezing, no crackles. No accessory muscle use.  Cardiovascular: S1 & S2 heard, regular rate and rhythm. No extremity edema.   Abdomen: No tenderness, soft. Bowel sounds active.  Musculoskeletal: no clubbing / cyanosis. No joint deformity upper and lower extremities.   Skin: no significant rashes, lesions, ulcers. Warm, dry, well-perfused. Neurologic: CN 2-12 grossly intact. Sensation to light touch intact. Strength 5/5 in all 4 limbs. Sleeping. Wakes to voice and is oriented to person, place, and situation.  Psychiatric: Pleasant. Cooperative.    Labs and Imaging on Admission: I have personally reviewed following labs and imaging studies  CBC: Recent Labs  Lab 09/23/24 2323  WBC 7.0  NEUTROABS 4.5  HGB 14.2  HCT 39.9  MCV 84.2  PLT 220   Basic Metabolic Panel: Recent Labs  Lab 09/23/24 2323  NA 116*  K 4.3  CL 81*  CO2 24  GLUCOSE 87  BUN <5*  CREATININE 0.75  CALCIUM  8.6*   GFR: CrCl cannot be calculated (Unknown ideal weight.). Liver Function Tests: Recent Labs  Lab 09/23/24 2323  AST 37  ALT 30  ALKPHOS 57  BILITOT 0.6  PROT 7.1  ALBUMIN 4.3   Recent Labs  Lab 09/23/24 2323  LIPASE 19   No results for input(s): AMMONIA in the last 168 hours. Coagulation Profile: No results for  input(s): INR, PROTIME in the last 168 hours. Cardiac Enzymes: No results for input(s): CKTOTAL, CKMB, CKMBINDEX, TROPONINI in the last 168 hours. BNP (last 3 results) No results for input(s): PROBNP in the last 8760 hours. HbA1C: No results for input(s): HGBA1C in the last 72 hours. CBG: No results for input(s): GLUCAP in the last 168 hours. Lipid Profile: No results for input(s): CHOL, HDL, LDLCALC, TRIG, CHOLHDL, LDLDIRECT in the last 72 hours. Thyroid Function Tests: No results for input(s): TSH, T4TOTAL, FREET4, T3FREE, THYROIDAB in the last 72 hours. Anemia Panel: No results for input(s): VITAMINB12, FOLATE, FERRITIN, TIBC, IRON, RETICCTPCT in the last 72 hours. Urine analysis:  Component Value Date/Time   COLORURINE YELLOW 09/24/2024 0117   APPEARANCEUR CLEAR 09/24/2024 0117   LABSPEC 1.010 09/24/2024 0117   PHURINE 6.0 09/24/2024 0117   GLUCOSEU NEGATIVE 09/24/2024 0117   HGBUR NEGATIVE 09/24/2024 0117   BILIRUBINUR NEGATIVE 09/24/2024 0117   BILIRUBINUR negative 07/29/2019 1007   KETONESUR NEGATIVE 09/24/2024 0117   PROTEINUR 100 (A) 09/24/2024 0117   UROBILINOGEN 0.2 07/29/2019 1007   NITRITE NEGATIVE 09/24/2024 0117   LEUKOCYTESUR NEGATIVE 09/24/2024 0117   Sepsis Labs: @LABRCNTIP (procalcitonin:4,lacticidven:4) )No results found for this or any previous visit (from the past 240 hours).   Radiological Exams on Admission: CT Head Wo Contrast Result Date: 09/24/2024 EXAM: CT HEAD WITHOUT CONTRAST 09/24/2024 01:12:04 AM TECHNIQUE: CT of the head was performed without the administration of intravenous contrast. Automated exposure control, iterative reconstruction, and/or weight based adjustment of the mA/kV was utilized to reduce the radiation dose to as low as reasonably achievable. COMPARISON: None available. CLINICAL HISTORY: Head trauma, minor (Age >= 65y). FINDINGS: BRAIN AND VENTRICLES: No acute hemorrhage. No  evidence of acute infarct. No hydrocephalus. No extra-axial collection. No mass effect or midline shift. Patchy white matter hypodensities, compatible with small vessel ischemic disease. ORBITS: No acute abnormality. SINUSES: No acute abnormality. SOFT TISSUES AND SKULL: No acute soft tissue abnormality. IMPRESSION: 1. No acute intracranial abnormality. Electronically signed by: Gilmore Molt MD 09/24/2024 01:17 AM EDT RP Workstation: [id number]   DG Ribs Unilateral W/Chest Left Result Date: 09/24/2024 CLINICAL DATA:  fall into handlebar of lawnmower, c/o pain to left lower ribs EXAM: LEFT RIBS AND CHEST - 3+ VIEW COMPARISON:  None Available. FINDINGS: No acute fracture or other bone lesions are seen involving the ribs. Multiple remote healed right rib fractures are noted. There is no evidence of pneumothorax or pleural effusion. Both lungs are clear. Heart size and mediastinal contours are within normal limits. IMPRESSION: Negative. Electronically Signed   By: Dorethia Molt M.D.   On: 09/24/2024 00:08    EKG: Independently reviewed. Sinus rhythm.   Assessment/Plan   1. Hyponatremia  - Serum sodium is 116 with no or minimal symptoms   - He reports drinking 12 beers in addition to multiple bottles of water and this is the likely etiology (citalopram  could also be contributing)  - Check urine sodium and urine osm, restrict free water intake, and follow serial sodium levels   2. Alcoholism  - Monitor for withdrawal, supplement vitamins, consult TOC    3. COPD   - Not in exacerbation    4. Hypertension  - Norvasc     5. Paroxysmal SVT  - Diltiazem    6. Anxiety  - Celexa    7. Left rib pain  - No fractures noted on plain radiographs  - Pain-control, incentive spirometry    DVT prophylaxis: Lovenox   Code Status: Full  Level of Care: Level of care: Stepdown Family Communication: Family updated from ED  Disposition Plan:  Patient is from: Home  Anticipated d/c is to: home   Anticipated d/c date is: 09/26/24  Patient currently: Pending improved sodium  Consults called: None  Admission status: Inpatient     Evalene GORMAN Sprinkles, MD [facility]  09/24/2024, 2:23 AM

## 2024-09-24 NOTE — ED Notes (Signed)
 Pt taken to CT.

## 2024-09-24 NOTE — Progress Notes (Signed)
  Progress Note   Patient: [name] FMW:[id number] DOB: 06/21/1956 DOA: 09/23/2024     0 DOS: the patient was seen and examined on 09/24/2024   Brief hospital admission narrative: As per H&P written by Dr. Charlton on 09/24/24  [name] is a 68 y.o. male with medical history significant for hypertension, COPD, anxiety, coronary artery calcifications, paroxysmal SVT, and alcoholism who presents with lower left rib pain after falling off of a lawnmower today.   Patient reports that he was in his usual state of health when he fell off of his lawnmower today but has been experiencing severe pain in the lower left lateral chest anytime he takes a deep breath or coughs.  He denies any other appreciable injury and did not lose consciousness.  He had already consumed a 12 pack of beer today which is typical for him.  Patient notes that he also drinks a lot of water while working outside.  He denies any headache, focal numbness or weakness, or change in vision.   ED Course: Upon arrival to the ED, patient is found to be afebrile and saturating well on room air with normal HR and elevated BP.  Labs are most notable for sodium 116, normal creatinine, normal WBC, undetectable troponin x 2, normal LFTs and lipase, and ethanol 232.  There are no acute findings on head CT, chest x-ray, or plain films of the left ribs.   Patient was treated with Toradol in the ED.  Assessment and plan 1- hyponatremia -116 level at time of admission -most likely component of dehydration and ongoing alcohol abuse -currently asymptomatic -patient with Na level in the 128-129 range at baseline  -will provide IVF's resuscitation, follow sodium level trend closely, check osmolality and TSH -alcohol cessation provided   2-alcoholism -ethanol 232 at time of admission -CIWA protocol started -thiamine  and folic acid  ordered -cessation counseling provided  3-GERD -continue PPI  4-left costal pain -no acute fractures  appreciated on x-ray -PRN analgesics and muscle relaxants ordered   5-HTN -continue current antihypertensive agents -follow VS  6-HLD -continue statin  7-depression/anxiety -continue celexa    Subjective:  No chest pain, no nausea, no vomiting.  Following commands appropriately.  Reporting left sciatic costal pain.  Physical Exam: Vitals:   09/24/24 0445 09/24/24 0545 09/24/24 0600 09/24/24 0709  BP: (!) 120/95 (!) 139/92 (!) 134/96 131/89  Pulse: 84  77 78  Resp:  16 16 17   Temp:      TempSrc:      SpO2: 100% 100% 98% 94%   General exam: Alert, awake, oriented x 3, no acute withdrawal currently appreciated. Respiratory system: Clear to auscultation. Respiratory effort normal.  Good saturation on room air. Cardiovascular system:RRR. No rubs or gallops; no JVD. Gastrointestinal system: Abdomen is nondistended, soft and nontender.  Positive bowel sounds. Central nervous system: No focal neurological deficits. Extremities: No cyanosis or clubbing. Skin: No petechiae. Psychiatry: Judgement and insight appear normal. Mood & affect appropriate.    Data Reviewed: Basic metabolic panel: Sodium 116, potassium 4.5, chloride 81, bicarb 21, BUN < 5, creatinine 0.66 and GFR >60 CBC: WBC 7.0, hemoglobin 14.6 and platelet count 14K Phosphorus: 3.2 Magnesium : 1.8  Family Communication: No family at bedside.  Disposition: Status is: Inpatient Remains inpatient appropriate because: Continue with therapy.  Anticipating discharge back home once medically stable.  Time spent: 50 minutes  Author: Eric Nunnery, MD 09/24/2024 8:14 AM  For on call review [email or web address].

## 2024-09-25 DIAGNOSIS — F418 Other specified anxiety disorders: Secondary | ICD-10-CM | POA: Diagnosis not present

## 2024-09-25 DIAGNOSIS — F411 Generalized anxiety disorder: Secondary | ICD-10-CM | POA: Diagnosis not present

## 2024-09-25 DIAGNOSIS — F1029 Alcohol dependence with unspecified alcohol-induced disorder: Secondary | ICD-10-CM | POA: Diagnosis not present

## 2024-09-25 DIAGNOSIS — E871 Hypo-osmolality and hyponatremia: Secondary | ICD-10-CM | POA: Diagnosis not present

## 2024-09-25 LAB — BASIC METABOLIC PANEL WITH GFR
Anion gap: 14 (ref 5–15)
Anion gap: 16 — ABNORMAL HIGH (ref 5–15)
Anion gap: 15 (ref 5–15)
BUN: 7 mg/dL — ABNORMAL LOW (ref 8–23)
BUN: 6 mg/dL — ABNORMAL LOW (ref 8–23)
BUN: 6 mg/dL — ABNORMAL LOW (ref 8–23)
CO2: 18 mmol/L — ABNORMAL LOW (ref 22–32)
CO2: 19 mmol/L — ABNORMAL LOW (ref 22–32)
CO2: 24 mmol/L (ref 22–32)
Calcium: 8.6 mg/dL — ABNORMAL LOW (ref 8.9–10.3)
Calcium: 8.5 mg/dL — ABNORMAL LOW (ref 8.9–10.3)
Calcium: 8.9 mg/dL (ref 8.9–10.3)
Chloride: 83 mmol/L — ABNORMAL LOW (ref 98–111)
Chloride: 84 mmol/L — ABNORMAL LOW (ref 98–111)
Chloride: 84 mmol/L — ABNORMAL LOW (ref 98–111)
Creatinine, Ser: 0.7 mg/dL (ref 0.61–1.24)
Creatinine, Ser: 0.69 mg/dL (ref 0.61–1.24)
Creatinine, Ser: 0.74 mg/dL (ref 0.61–1.24)
GFR, Estimated: >60 mL/min (ref >60)
GFR, Estimated: >60 mL/min (ref >60)
GFR, Estimated: >60 mL/min (ref >60)
Glucose, Bld: 73 mg/dL (ref 70–99)
Glucose, Bld: 74 mg/dL (ref 70–99)
Glucose, Bld: 94 mg/dL (ref 70–99)
Potassium: 5.2 mmol/L — ABNORMAL HIGH (ref 3.5–5.1)
Potassium: 4.9 mmol/L (ref 3.5–5.1)
Potassium: 4.6 mmol/L (ref 3.5–5.1)
Sodium: 115 mmol/L — CL (ref 135–145)
Sodium: 119 mmol/L — CL (ref 135–145)
Sodium: 124 mmol/L — ABNORMAL LOW (ref 135–145)

## 2024-09-25 LAB — CBC
HCT: 45.2 % (ref 39.0–52.0)
Hemoglobin: 15.5 g/dL (ref 13.0–17.0)
MCH: 29.9 pg (ref 26.0–34.0)
MCHC: 34.3 g/dL (ref 30.0–36.0)
MCV: 87.1 fL (ref 80.0–100.0)
Platelets: 218 K/uL (ref 150–400)
RBC: 5.19 MIL/uL (ref 4.22–5.81)
RDW: 12.3 % (ref 11.5–15.5)
WBC: 8.7 K/uL (ref 4.0–10.5)
nRBC: 0 % (ref 0.0–0.2)

## 2024-09-25 LAB — HIV ANTIBODY (ROUTINE TESTING W REFLEX): HIV Screen 4th Generation wRfx: NONREACTIVE

## 2024-09-25 MED ORDER — HYDRALAZINE HCL 20 MG/ML IJ SOLN
10.0000 mg | Freq: Four times a day (QID) | INTRAMUSCULAR | Status: DC | PRN
  Administered 2024-09-25: 10 mg via INTRAVENOUS
  Filled 2024-09-25: qty 1

## 2024-09-25 MED ORDER — HYDRALAZINE HCL 20 MG/ML IJ SOLN
5.0000 mg | Freq: Once | INTRAMUSCULAR | Status: AC
  Administered 2024-09-25: 5 mg via INTRAVENOUS
  Filled 2024-09-25: qty 1

## 2024-09-25 MED ORDER — SODIUM CHLORIDE 0.9 % IV SOLN: INTRAVENOUS | Status: AC

## 2024-09-25 MED ORDER — HYDROCOD POLI-CHLORPHE POLI ER 10-8 MG/5ML PO SUER
5.0000 mL | Freq: Two times a day (BID) | ORAL | Status: DC | PRN
  Administered 2024-09-25: 5 mL via ORAL
  Filled 2024-09-25: qty 5

## 2024-09-25 MED ORDER — LIDOCAINE 5 % EX PTCH
1.0000 | MEDICATED_PATCH | CUTANEOUS | Status: DC
  Administered 2024-09-25: 1 via TRANSDERMAL
  Filled 2024-09-25: qty 1

## 2024-09-25 NOTE — Plan of Care (Signed)

## 2024-09-25 NOTE — Significant Event (Addendum)
       CROSS COVER NOTE  NAME: [name] MRN: [id number] DOB : [birth year] ATTENDING PHYSICIAN: Ricky Fines, MD    Date of Service   09/25/2024   HPI/Events of Note   [facility] at [facility] Critical sodium level reported by nurse of 115.   HPI patient admitted with acute on chronic hyponatremia, alcohol related   Interventions   Assessment/Plan:    09/24/2024   11:00 PM 09/24/2024    9:02 PM 09/24/2024    8:00 PM  Vitals with BMI  Systolic 169 135 836  Diastolic 99 99 97  Pulse 87 86 80    Increased NS infusion 125 Repeat sodium (BMP) level 0230  Latest Reference Range & Units 09/24/24 16:15 09/24/24 22:40 09/25/24 03:06  Sodium 135 - 145 mmol/L 116 (LL) 115 (LL) 119 (LL)  Potassium 3.5 - 5.1 mmol/L 4.8 5.2 (H) 4.9  Chloride 98 - 111 mmol/L 82 (L) 83 (L) 84 (L)  CO2 22 - 32 mmol/L 22 18 (L) 19 (L)  Glucose 70 - 99 mg/dL 65 (L) 73 74  BUN 8 - 23 mg/dL 7 (L) 7 (L) 6 (L)  Creatinine 0.61 - 1.24 mg/dL 9.20 9.29 9.30  Calcium  8.9 - 10.3 mg/dL 9.0 8.6 (L) 8.5 (L)  Anion gap 5 - 15  12 14 16  (H)  (LL): Data is critically low (H): Data is abnormally high (L): Data is abnormally low       Erminio LITTIE Cone NP [facility] Cross Cover 7pm-7am - check amion for availability Pager [phone]

## 2024-09-25 NOTE — Progress Notes (Signed)
 Progress Note   Patient: [name] FMW:[id number] DOB: 06-10-1956 DOA: 09/23/2024     1 DOS: the patient was seen and examined on 09/25/2024   Brief hospital admission narrative: As per H&P written by Dr. Charlton on 09/24/24  [name] is a 68 y.o. male with medical history significant for hypertension, COPD, anxiety, coronary artery calcifications, paroxysmal SVT, and alcoholism who presents with lower left rib pain after falling off of a lawnmower today.   Patient reports that he was in his usual state of health when he fell off of his lawnmower today but has been experiencing severe pain in the lower left lateral chest anytime he takes a deep breath or coughs.  He denies any other appreciable injury and did not lose consciousness.  He had already consumed a 12 pack of beer today which is typical for him.  Patient notes that he also drinks a lot of water while working outside.  He denies any headache, focal numbness or weakness, or change in vision.   ED Course: Upon arrival to the ED, patient is found to be afebrile and saturating well on room air with normal HR and elevated BP.  Labs are most notable for sodium 116, normal creatinine, normal WBC, undetectable troponin x 2, normal LFTs and lipase, and ethanol 232.  There are no acute findings on head CT, chest x-ray, or plain films of the left ribs.   Patient was treated with Toradol in the ED.  Assessment and plan 1- hyponatremia -116 level at time of admission -most likely component of dehydration and ongoing alcohol abuse -currently asymptomatic and not demonstrating any neurologic changes. -patient with Na level in the 128-129 range at baseline  -will provide IVF's resuscitation, follow sodium level trend closely, check osmolality and TSH -alcohol cessation provided  -with IVF's sodium up to 119  2-alcoholism -ethanol 232 at time of admission -will continue CIWA protocol  -no active withdrawal symptoms  currently -continue thiamine  and folic acid   -cessation counseling provided  3-GERD -continue PPI  4-left costal pain -no acute fractures appreciated on x-ray -continue PRN analgesics and muscle relaxants  -lidoderm  patch ordered   5-HTN -continue current antihypertensive agents -follow VS and adjust meds as needed.  6-HLD -continue statin  7-depression/anxiety -continue celexa    Subjective:  Afebrile, no CP, no nausea, no vomiting. Patient reports ongoing pain in his left costal area. No acute withdrawal symptoms.  Physical Exam: Vitals:   09/25/24 1300 09/25/24 1400 09/25/24 1500 09/25/24 1641  BP:  (!) 163/93 (!) 146/100   Pulse: 99 92 92   Resp: (!) 22 (!) 21 (!) 22   Temp:   98.5 F (36.9 C)   TempSrc:   Oral   SpO2: 90% 94% 95% 95%  Weight:      Height:       General exam: Alert, awake, oriented x 3, no CP, no SOB. Respiratory system: Clear to auscultation. Respiratory effort normal. Cardiovascular system:RRR. No rubs or gallops, no JVD Gastrointestinal system: Abdomen is nondistended, soft and nontender. No organomegaly or masses felt. Normal bowel sounds heard. Central nervous system:  No focal neurological deficits. Extremities: No Cyanosis or clubbing. Skin: No petechiae. Psychiatry: Judgement and insight appear normal. Mood & affect appropriate.   Data Reviewed: Basic metabolic panel: Sodium 119, potassium 4.6, chloride 84, bicarb 24, BUN 6, creatinine 0.t4 and GFR >60 CBC: WBC 8.7, Hgb 15.5, platelets coun 218K Phosphorus: 3.2 Magnesium : 1.8  Family Communication: No family at bedside.  Disposition:  Status is: Inpatient Remains inpatient appropriate because: Continue with therapy.  Anticipating discharge back home once medically stable.  Time spent: 50 minutes  Author: Eric Nunnery, MD 09/25/2024 5:24 PM  For on call review [email or web address].

## 2024-09-26 DIAGNOSIS — J449 Chronic obstructive pulmonary disease, unspecified: Secondary | ICD-10-CM | POA: Diagnosis not present

## 2024-09-26 DIAGNOSIS — F418 Other specified anxiety disorders: Secondary | ICD-10-CM | POA: Diagnosis not present

## 2024-09-26 DIAGNOSIS — F1029 Alcohol dependence with unspecified alcohol-induced disorder: Secondary | ICD-10-CM | POA: Diagnosis not present

## 2024-09-26 DIAGNOSIS — R0789 Other chest pain: Secondary | ICD-10-CM

## 2024-09-26 DIAGNOSIS — E871 Hypo-osmolality and hyponatremia: Secondary | ICD-10-CM | POA: Diagnosis not present

## 2024-09-26 DIAGNOSIS — F10920 Alcohol use, unspecified with intoxication, uncomplicated: Secondary | ICD-10-CM

## 2024-09-26 LAB — CBC
HCT: 38.9 % — ABNORMAL LOW (ref 39.0–52.0)
Hemoglobin: 13.5 g/dL (ref 13.0–17.0)
MCH: 29.5 pg (ref 26.0–34.0)
MCHC: 34.7 g/dL (ref 30.0–36.0)
MCV: 84.9 fL (ref 80.0–100.0)
Platelets: 206 K/uL (ref 150–400)
RBC: 4.58 MIL/uL (ref 4.22–5.81)
RDW: 12.2 % (ref 11.5–15.5)
WBC: 9.2 K/uL (ref 4.0–10.5)
nRBC: 0 % (ref 0.0–0.2)

## 2024-09-26 MED ORDER — METHOCARBAMOL 500 MG PO TABS: 500.0000 mg | ORAL_TABLET | Freq: Three times a day (TID) | ORAL | 0 refills | Status: DC | PRN

## 2024-09-26 MED ORDER — VITAMIN B-1 100 MG PO TABS: 100.0000 mg | ORAL_TABLET | Freq: Every day | ORAL | 3 refills | Status: AC

## 2024-09-26 MED ORDER — CHLORDIAZEPOXIDE HCL 5 MG PO CAPS: ORAL_CAPSULE | ORAL | 0 refills | Status: DC

## 2024-09-26 MED ORDER — LIDOCAINE 5 % EX PTCH: 1.0000 | MEDICATED_PATCH | CUTANEOUS | 0 refills | Status: DC

## 2024-09-26 MED ORDER — FOLIC ACID 1 MG PO TABS: 1.0000 mg | ORAL_TABLET | Freq: Every day | ORAL | 3 refills | Status: AC

## 2024-09-26 MED ORDER — TRAMADOL HCL 50 MG PO TABS: 50.0000 mg | ORAL_TABLET | Freq: Three times a day (TID) | ORAL | 0 refills | Status: DC | PRN

## 2024-09-26 MED ORDER — PANTOPRAZOLE SODIUM 40 MG PO TBEC: 40.0000 mg | DELAYED_RELEASE_TABLET | Freq: Every day | ORAL | 1 refills | Status: DC

## 2024-09-26 NOTE — Progress Notes (Signed)
 Patient has discharge orders, discharge instructions given no further questions at this time.

## 2024-09-26 NOTE — Discharge Summary (Signed)
 Physician Discharge Summary   Patient: [name] MRN: [id number] DOB: [birth year]  Admit date:     09/23/2024  Discharge date: 09/26/24  Discharge Physician: Eric Nunnery   PCP: Antonetta Rollene BRAVO, MD   Recommendations at discharge:  Repeat basic metabolic panel to follow his electrolytes and renal function Reassess blood pressure and adjust antihypertensive regimen as needed Continue assisting patient with alcohol cessation Continue assisting patient with tobacco cessation.  Discharge Diagnoses: Principal Problem:   Hyponatremia Active Problems:   Depression with anxiety   Alcohol addiction (HCC)   GAD (generalized anxiety disorder)   COPD GOLD 3 likely and still smoking    PSVT (paroxysmal supraventricular tachycardia)   Alcoholic intoxication without complication   Chest wall pain Tobacco abuse  Brief hospital admission narrative: As per H&P written by Dr. Charlton on 09/24/24  [name] is a 68 y.o. male with medical history significant for hypertension, COPD, anxiety, coronary artery calcifications, paroxysmal SVT, and alcoholism who presents with lower left rib pain after falling off of a lawnmower today.   Patient reports that he was in his usual state of health when he fell off of his lawnmower today but has been experiencing severe pain in the lower left lateral chest anytime he takes a deep breath or coughs.  He denies any other appreciable injury and did not lose consciousness.  He had already consumed a 12 pack of beer today which is typical for him.  Patient notes that he also drinks a lot of water while working outside.  He denies any headache, focal numbness or weakness, or change in vision.   ED Course: Upon arrival to the ED, patient is found to be afebrile and saturating well on room air with normal HR and elevated BP.  Labs are most notable for sodium 116, normal creatinine, normal WBC, undetectable troponin x 2, normal LFTs and lipase, and ethanol 232.   There are no acute findings on head CT, chest x-ray, or plain films of the left ribs.   Patient was treated with Toradol in the ED.  Assessment and Plan: 1- hyponatremia -116 level at time of admission. -most likely component of dehydration and ongoing alcohol abuse -currently asymptomatic and not demonstrating any neurologic changes. - Repeat basic metabolic panel follow-up visit to assess electrolytes stability. - TSH within normal limits; patient's sodium at discharge in the 125-126 range; completely asymptomatic and tolerating diet appropriately - Alcohol cessation counseling has been encouraged.   2-alcoholism -ethanol 232 at time of admission - Cessation counseling provided - CIWA scale less than 7 at discharge; no significant withdrawal symptoms present - Continue thiamine  and folic acid  - Librium tapering provided at discharge.   3-GERD -continue PPI.   4-left costal pain -no acute fractures appreciated on x-ray -continue PRN analgesics and muscle relaxants  -lidoderm  patch also ordered    5-HTN -continue current antihypertensive agents -follow VS and adjust meds as needed. -Heart healthy diet discussed with patient.   6-HLD -continue statin -Heart healthy diet discussed with patient.   7-depression/anxiety -continue celexa   8-tobacco abuse - Cessation counseling provided - Patient declines nicotine  patch; reports that he will work on quitting.  Consultants: None Procedures performed: See below for x-ray reports. Disposition: Home Diet recommendation: Heart healthy diet.  DISCHARGE MEDICATION: Allergies as of 09/26/2024       Reactions   Lisinopril  Swelling, Other (See Comments)    Angioedema        Medication List     TAKE  these medications    albuterol  108 (90 Base) MCG/ACT inhaler Commonly known as: VENTOLIN  HFA Inhale 2 puffs into the lungs every 6 (six) hours as needed for wheezing or shortness of breath.   amLODipine  10 MG  tablet Commonly known as: NORVASC  Take 1 tablet (10 mg total) by mouth daily.   chlordiazePOXIDE 5 MG capsule Commonly known as: LIBRIUM Take 2 tablets TID X 1 day; then 1 tablet TID X 2 days; then 1 tablet BID X 3 days; then 1 tab daily X 3 days and stop librium.   citalopram  10 MG tablet Commonly known as: CELEXA  Take 1 tablet (10 mg total) by mouth daily.   diltiazem  120 MG 24 hr capsule Commonly known as: CARDIZEM  CD Take 1 capsule (120 mg total) by mouth daily.   folic acid  1 MG tablet Commonly known as: FOLVITE  Take 1 tablet (1 mg total) by mouth daily. Start taking on: September 27, 2024   ipratropium-albuterol  0.5-2.5 (3) MG/3ML Soln Commonly known as: DUONEB INHALE 1 VIAL VIA NEBULIZER EVERY SIX HOURS AS NEEDED FOR WHEEZING/ SHORTNESS OF BREATH   lidocaine  5 % Commonly known as: LIDODERM  Place 1 patch onto the skin daily. Remove & Discard patch within 12 hours or as directed by MD   methocarbamol  500 MG tablet Commonly known as: ROBAXIN  Take 1 tablet (500 mg total) by mouth every 8 (eight) hours as needed for muscle spasms.   pantoprazole 40 MG tablet Commonly known as: PROTONIX Take 1 tablet (40 mg total) by mouth daily. Start taking on: September 27, 2024   rosuvastatin  5 MG tablet Commonly known as: CRESTOR  Take 1 tablet (5 mg total) by mouth daily.   thiamine  100 MG tablet Commonly known as: Vitamin B-1 Take 1 tablet (100 mg total) by mouth daily. Start taking on: September 27, 2024   traMADol 50 MG tablet Commonly known as: ULTRAM Take 1 tablet (50 mg total) by mouth every 8 (eight) hours as needed for severe pain (pain score 7-10).        Follow-up Information     Antonetta Rollene BRAVO, MD. Schedule an appointment as soon as possible for a visit in 10 day(s).   Specialty: Family Medicine Contact information: [address] [place] [address] [phone]                Discharge Exam: Fredricka Weights   09/24/24 1500  Weight:  58.9 kg   General exam: Alert, awake, oriented x 3; reporting still ongoing intermittent left-sided costal pain.  Good saturation on room air.  No fever. Respiratory system: Clear to auscultation. Respiratory effort normal.  No using accessory muscles. Cardiovascular system:RRR. No murmurs, rubs, gallops. Gastrointestinal system: Abdomen is nondistended, soft and nontender. No organomegaly or masses felt. Normal bowel sounds heard. Central nervous system: Alert and oriented. No focal neurological deficits. Extremities: No C/C/E, +pedal pulses Skin: No rashes, lesions or ulcers Psychiatry: Judgement and insight appear normal. Mood & affect appropriate.    Condition at discharge: Stable and improved.  The results of significant diagnostics from this hospitalization (including imaging, microbiology, ancillary and laboratory) are listed below for reference.   Imaging Studies: CT Head Wo Contrast Result Date: 09/24/2024 EXAM: CT HEAD WITHOUT CONTRAST 09/24/2024 01:12:04 AM TECHNIQUE: CT of the head was performed without the administration of intravenous contrast. Automated exposure control, iterative reconstruction, and/or weight based adjustment of the mA/kV was utilized to reduce the radiation dose to as low as reasonably achievable. COMPARISON: None available. CLINICAL  HISTORY: Head trauma, minor (Age >= 65y). FINDINGS: BRAIN AND VENTRICLES: No acute hemorrhage. No evidence of acute infarct. No hydrocephalus. No extra-axial collection. No mass effect or midline shift. Patchy white matter hypodensities, compatible with small vessel ischemic disease. ORBITS: No acute abnormality. SINUSES: No acute abnormality. SOFT TISSUES AND SKULL: No acute soft tissue abnormality. IMPRESSION: 1. No acute intracranial abnormality. Electronically signed by: Gilmore Molt MD 09/24/2024 01:17 AM EDT RP Workstation: [id number]   DG Ribs Unilateral W/Chest Left Result Date: 09/24/2024 CLINICAL DATA:  fall into  handlebar of lawnmower, c/o pain to left lower ribs EXAM: LEFT RIBS AND CHEST - 3+ VIEW COMPARISON:  None Available. FINDINGS: No acute fracture or other bone lesions are seen involving the ribs. Multiple remote healed right rib fractures are noted. There is no evidence of pneumothorax or pleural effusion. Both lungs are clear. Heart size and mediastinal contours are within normal limits. IMPRESSION: Negative. Electronically Signed   By: Dorethia Molt M.D.   On: 09/24/2024 00:08    Microbiology: Results for orders placed or performed during the hospital encounter of 09/23/24  MRSA Next Gen by PCR, Nasal     Status: None   Collection Time: 09/24/24  2:57 PM   Specimen: Nasal Mucosa; Nasal Swab  Result Value Ref Range Status   MRSA by PCR Next Gen NOT DETECTED NOT DETECTED Final    Comment: (NOTE) The GeneXpert MRSA Assay (FDA approved for NASAL specimens only), is one component of a comprehensive MRSA colonization surveillance program. It is not intended to diagnose MRSA infection nor to guide or monitor treatment for MRSA infections. Test performance is not FDA approved in patients less than 78 years old. Performed at [facility], [address], [place], [place] [address]     Labs: CBC: Recent Labs  Lab 09/23/24 2323 09/24/24 0255 09/25/24 0306 09/26/24 0358  WBC 7.0 7.0 8.7 9.2  NEUTROABS 4.5  --   --   --   HGB 14.2 14.6 15.5 13.5  HCT 39.9 40.9 45.2 38.9*  MCV 84.2 84.7 87.1 84.9  PLT 220 214 218 206   Basic Metabolic Panel: Recent Labs  Lab 09/24/24 0255 09/24/24 1219 09/24/24 1615 09/24/24 2240 09/25/24 0306 09/25/24 0748  NA 116* 117* 116* 115* 119* 124*  K 4.5 4.0 4.8 5.2* 4.9 4.6  CL 81* 80* 82* 83* 84* 84*  CO2 21* 19* 22 18* 19* 24  GLUCOSE 77 50* 65* 73 74 94  BUN <5* 7* 7* 7* 6* 6*  CREATININE 0.66 0.76 0.79 0.70 0.69 0.74  CALCIUM  8.5* 9.0 9.0 8.6* 8.5* 8.9  MG 1.8  --   --   --   --   --   PHOS 3.2  --   --   --   --   --    Liver Function  Tests: Recent Labs  Lab 09/23/24 2323  AST 37  ALT 30  ALKPHOS 57  BILITOT 0.6  PROT 7.1  ALBUMIN 4.3   CBG: No results for input(s): GLUCAP in the last 168 hours.  Discharge time spent:  35 minutes.  Signed: Eric Nunnery, MD [facility] 09/26/2024

## 2024-09-26 NOTE — Plan of Care (Signed)
  Problem: Clinical Measurements: Goal: Diagnostic test results will improve Outcome: Not Progressing   Problem: Clinical Measurements: Goal: Cardiovascular complication will be avoided Outcome: Not Progressing   Problem: Pain Managment: Goal: General experience of comfort will improve and/or be controlled Outcome: Not Progressing   Problem: Safety: Goal: Ability to remain free from injury will improve Outcome: Not Progressing

## 2024-09-28 ENCOUNTER — Telehealth: Payer: Self-pay

## 2024-09-28 NOTE — Transitions of Care (Post Inpatient/ED Visit) (Unsigned)
   09/28/2024  Name: [name] MRN: [id number] DOB: 01-Sep-1956  Today's TOC FU Call Status: Today's TOC FU Call Status:: Unsuccessful Call (1st Attempt) Unsuccessful Call (1st Attempt) Date: 09/28/24  Attempted to reach the patient regarding the most recent Inpatient/ED visit.  Follow Up Plan: Additional outreach attempts will be made to reach the patient to complete the Transitions of Care (Post Inpatient/ED visit) call.   Signature Julian Lemmings, LPN [facility] Nurse Health Advisor Direct Dial [phone]

## 2024-09-29 NOTE — Transitions of Care (Post Inpatient/ED Visit) (Unsigned)
   09/29/2024  Name: THURL BOEN MRN: [id number] DOB: Mar 12, 1956  Today's TOC FU Call Status: Today's TOC FU Call Status:: Unsuccessful Call (2nd Attempt) Unsuccessful Call (1st Attempt) Date: 09/28/24 Unsuccessful Call (2nd Attempt) Date: 09/29/24  Attempted to reach the patient regarding the most recent Inpatient/ED visit.  Follow Up Plan: Additional outreach attempts will be made to reach the patient to complete the Transitions of Care (Post Inpatient/ED visit) call.   Signature  Julian Lemmings, LPN [facility] Nurse Health Advisor Direct Dial [phone]

## 2024-09-30 NOTE — Transitions of Care (Post Inpatient/ED Visit) (Signed)
   09/30/2024  Name: [name] MRN: [id number] DOB: [birth year]  Today's TOC FU Call Status: Today's TOC FU Call Status:: Unsuccessful Call (3rd Attempt) Unsuccessful Call (1st Attempt) Date: 09/28/24 Unsuccessful Call (2nd Attempt) Date: 09/29/24 Unsuccessful Call (3rd Attempt) Date: 09/30/24  Attempted to reach the patient regarding the most recent Inpatient/ED visit.  Follow Up Plan: No further outreach attempts will be made at this time. We have been unable to contact the patient.  Signature Julian Lemmings, LPN [facility] Nurse Health Advisor Direct Dial [phone]

## 2024-10-15 ENCOUNTER — Other Ambulatory Visit: Payer: Self-pay | Admitting: Family Medicine

## 2024-10-20 ENCOUNTER — Ambulatory Visit: Payer: Self-pay | Admitting: Family Medicine

## 2024-10-20 ENCOUNTER — Encounter: Payer: Self-pay | Admitting: Family Medicine

## 2024-10-20 VITALS — BP 126/77 | HR 98 | Ht 66.0 in | Wt 134.1 lb

## 2024-10-20 DIAGNOSIS — I10 Essential (primary) hypertension: Secondary | ICD-10-CM

## 2024-10-20 DIAGNOSIS — E871 Hypo-osmolality and hyponatremia: Secondary | ICD-10-CM

## 2024-10-20 DIAGNOSIS — F17210 Nicotine dependence, cigarettes, uncomplicated: Secondary | ICD-10-CM

## 2024-10-20 DIAGNOSIS — Z23 Encounter for immunization: Secondary | ICD-10-CM

## 2024-10-20 DIAGNOSIS — Z09 Encounter for follow-up examination after completed treatment for conditions other than malignant neoplasm: Secondary | ICD-10-CM | POA: Diagnosis not present

## 2024-10-20 DIAGNOSIS — F10259 Alcohol dependence with alcohol-induced psychotic disorder, unspecified: Secondary | ICD-10-CM

## 2024-10-20 DIAGNOSIS — E785 Hyperlipidemia, unspecified: Secondary | ICD-10-CM | POA: Diagnosis not present

## 2024-10-20 DIAGNOSIS — J449 Chronic obstructive pulmonary disease, unspecified: Secondary | ICD-10-CM

## 2024-10-20 DIAGNOSIS — F1020 Alcohol dependence, uncomplicated: Secondary | ICD-10-CM

## 2024-10-20 MED ORDER — AMLODIPINE BESYLATE 10 MG PO TABS: 10.0000 mg | ORAL_TABLET | Freq: Every day | ORAL | 2 refills | Status: AC

## 2024-10-20 MED ORDER — CITALOPRAM HYDROBROMIDE 10 MG PO TABS: 10.0000 mg | ORAL_TABLET | Freq: Every day | ORAL | 5 refills | Status: AC

## 2024-10-20 MED ORDER — DILTIAZEM HCL ER COATED BEADS 120 MG PO CP24: 120.0000 mg | ORAL_CAPSULE | Freq: Every day | ORAL | 2 refills | Status: AC

## 2024-10-20 MED ORDER — PANTOPRAZOLE SODIUM 40 MG PO TBEC: 40.0000 mg | DELAYED_RELEASE_TABLET | Freq: Every day | ORAL | 1 refills | Status: AC

## 2024-10-20 NOTE — Patient Instructions (Addendum)
 F/u in 4 months  Labs today lipid, cmp and eGFR  Flu vaccine today  Pneumonia vaccine nurse visit in 2 weeks    Need covid vaccine at your pharmacy  Information for alcohol rehab will be given to you  Work on quitting cigarettes  and also stopping alcohol  Thanks for choosing [facility], we consider it a privelige to serve you.

## 2024-11-01 ENCOUNTER — Encounter: Payer: Self-pay | Admitting: Family Medicine

## 2024-11-01 DIAGNOSIS — Z23 Encounter for immunization: Secondary | ICD-10-CM | POA: Insufficient documentation

## 2024-11-01 NOTE — Assessment & Plan Note (Signed)
 After obtaining informed consent, the vaccine is  administered , with no adverse effect noted at the time of administration.

## 2024-11-01 NOTE — Assessment & Plan Note (Signed)
 Updated lab needed.

## 2024-11-01 NOTE — Progress Notes (Signed)
   BOBBIE VALLETTA     MRN: [id number]      DOB: 09/08/1956  Chief Complaint  Patient presents with   Hospitalization Follow-up    Hospital follow up     HPI Mr. Pennypacker is here for follow up of recent hospitalization from 10/8 to 10/11 admitted due  with hyponatremia, and ongoing alcohol addiction had fallen off lawnmower with rib pain but no radiologic evidence of fracture . Discharge summary, and laboratory and radiology data are reviewed, and any questions or concerns  are discussed. Specific issues requiring follow up are specifically addressed. Still drinks beer daily and is still smoking cigarettes, states he will try to cut back on and quit both  ROS Denies recent fever or chills. Denies sinus pressure, nasal congestion, ear pain or sore throat. Denies chest congestion, productive cough or wheezing. Denies chest pains, palpitations and leg swelling Denies abdominal pain, nausea, vomiting,diarrhea or constipation.   Denies dysuria, frequency, hesitancy or incontinence. C/o posterior left lower chest pain Denies headaches, seizures, numbness, or tingling. Denies depression, anxiety or insomnia. Denies skin break down or rash.   PE BP 126/77   Pulse 98   Ht 5' 6 (1.676 m)   Wt 134 lb 1.3 oz (60.8 kg)   SpO2 92%   BMI 21.64 kg/m   Patient alert and oriented and in no cardiopulmonary distress.  HEENT: No facial asymmetry, EOMI,     Neck supple .  Chest: Clear to auscultation bilaterally.decreased air entry, tender over lower left posterior chest wall  CVS: S1, S2 no murmurs, no S3.Regular rate.  ABD: Soft non tender.   Ext: No edema  MS: Adequate ROM spine, shoulders, hips and knees.  Skin: Intact, no ulcerations or rash noted.  Psych: Good eye contact, normal affect. Memory intact not anxious or depressed appearing.  CNS: CN 2-12 intact, power,  normal throughout.no focal deficits noted.   Assessment & Plan Hyponatremia Updated lab needed  .   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 Remains in denial as to the severity of his addiction, unlikely to follow through on recommendations to quit at this time, wil continue to encourage/ advise pt to do so  Cigarette smoker Asked:confirms currently smokes cigarettes Assess: Unwilling to set a quit date, plans to start cutting back Advise: needs to QUIT to reduce risk of cancer, cardio and cerebrovascular disease Assist: counseled for 5 minutes and literature provided Arrange: follow up in 2 to 4 months   COPD GOLD 3 likely and still smoking  Poor lung function with ongoing nicotine  use ,encouraged to quit  Hospital discharge follow-up Patient in for follow up of recent hospitalization. Discharge summary, and laboratory and radiology data are reviewed, and any questions or concerns  are discussed. Specific issues requiring follow up are specifically addressed.   Influenza vaccination administered at current visit After obtaining informed consent, the vaccine is  administered , with no adverse effect noted at the time of administration.

## 2024-11-01 NOTE — Assessment & Plan Note (Signed)
 Counseled again re need to quit alcohol use and  join outpt treatment which is now present in the community. Remains in denial as to the severity of his addiction, unlikely to follow through on recommendations to quit at this time, wil continue to encourage/ advise pt to do so

## 2024-11-01 NOTE — Assessment & Plan Note (Signed)
 Poor lung function with ongoing nicotine  use ,encouraged to quit

## 2024-11-01 NOTE — Assessment & Plan Note (Signed)
 Asked:confirms currently smokes cigarettes Assess: Unwilling to set a quit date, plans to start cutting back Advise: needs to QUIT to reduce risk of cancer, cardio and cerebrovascular disease Assist: counseled for 5 minutes and literature provided Arrange: follow up in 2 to 4 months

## 2024-11-01 NOTE — Assessment & Plan Note (Signed)
Patient in for follow up of recent hospitalization. Discharge summary, and laboratory and radiology data are reviewed, and any questions or concerns  are discussed. Specific issues requiring follow up are specifically addressed.  

## 2024-11-03 ENCOUNTER — Ambulatory Visit (INDEPENDENT_AMBULATORY_CARE_PROVIDER_SITE_OTHER)

## 2024-11-03 DIAGNOSIS — Z23 Encounter for immunization: Secondary | ICD-10-CM

## 2024-11-03 NOTE — Progress Notes (Signed)
 Patient is in office today for a nurse visit for Immunization. Patient Injection was given in the  Right deltoid. Patient tolerated injection well.

## 2025-02-17 ENCOUNTER — Ambulatory Visit: Admitting: Family Medicine
